# Patient Record
Sex: Female | Born: 1940
Health system: Southern US, Community
[De-identification: ages and names within clinical notes are randomized; demographics above are authoritative.]

## PROBLEM LIST (undated history)

## (undated) DIAGNOSIS — G43909 Migraine, unspecified, not intractable, without status migrainosus: Secondary | ICD-10-CM

## (undated) DIAGNOSIS — E7800 Pure hypercholesterolemia, unspecified: Secondary | ICD-10-CM

## (undated) DIAGNOSIS — Z8719 Personal history of other diseases of the digestive system: Secondary | ICD-10-CM

## (undated) DIAGNOSIS — K5790 Diverticulosis of intestine, part unspecified, without perforation or abscess without bleeding: Secondary | ICD-10-CM

## (undated) DIAGNOSIS — G2581 Restless legs syndrome: Secondary | ICD-10-CM

## (undated) DIAGNOSIS — E669 Obesity, unspecified: Secondary | ICD-10-CM

## (undated) DIAGNOSIS — J449 Chronic obstructive pulmonary disease, unspecified: Secondary | ICD-10-CM

## (undated) DIAGNOSIS — J45909 Unspecified asthma, uncomplicated: Secondary | ICD-10-CM

## (undated) DIAGNOSIS — K219 Gastro-esophageal reflux disease without esophagitis: Secondary | ICD-10-CM

## (undated) DIAGNOSIS — K635 Polyp of colon: Secondary | ICD-10-CM

## (undated) HISTORY — DX: Personal history of other diseases of the digestive system: Z87.19

## (undated) HISTORY — DX: Diverticulosis of intestine, part unspecified, without perforation or abscess without bleeding: K57.90

## (undated) HISTORY — PX: TONSILLECTOMY: SUR1361

## (undated) HISTORY — DX: Polyp of colon: K63.5

## (undated) HISTORY — DX: Obesity, unspecified: E66.9

## (undated) HISTORY — DX: Pure hypercholesterolemia, unspecified: E78.00

## (undated) HISTORY — DX: Restless legs syndrome: G25.81

## (undated) HISTORY — DX: Gastro-esophageal reflux disease without esophagitis: K21.9

## (undated) HISTORY — DX: Migraine, unspecified, not intractable, without status migrainosus: G43.909

## (undated) HISTORY — DX: Unspecified asthma, uncomplicated: J45.909

---

## 1989-02-17 HISTORY — PX: TOTAL ABDOMINAL HYSTERECTOMY: SHX209

## 1999-06-05 ENCOUNTER — Encounter: Payer: Self-pay | Admitting: Emergency Medicine

## 1999-06-05 ENCOUNTER — Inpatient Hospital Stay (HOSPITAL_COMMUNITY): Admission: EM | Admit: 1999-06-05 | Discharge: 1999-06-06 | Payer: Self-pay | Admitting: Emergency Medicine

## 1999-10-29 ENCOUNTER — Other Ambulatory Visit: Admission: RE | Admit: 1999-10-29 | Discharge: 1999-10-29 | Payer: Self-pay | Admitting: Internal Medicine

## 2000-01-21 ENCOUNTER — Encounter: Payer: Self-pay | Admitting: Internal Medicine

## 2000-01-21 ENCOUNTER — Encounter: Admission: RE | Admit: 2000-01-21 | Discharge: 2000-01-21 | Payer: Self-pay | Admitting: Internal Medicine

## 2000-09-19 ENCOUNTER — Other Ambulatory Visit: Admission: RE | Admit: 2000-09-19 | Discharge: 2000-09-19 | Payer: Self-pay | Admitting: Internal Medicine

## 2001-01-26 ENCOUNTER — Encounter: Payer: Self-pay | Admitting: Internal Medicine

## 2001-01-26 ENCOUNTER — Encounter: Admission: RE | Admit: 2001-01-26 | Discharge: 2001-01-26 | Payer: Self-pay | Admitting: Internal Medicine

## 2002-02-01 ENCOUNTER — Encounter: Admission: RE | Admit: 2002-02-01 | Discharge: 2002-02-01 | Payer: Self-pay | Admitting: Internal Medicine

## 2002-02-01 ENCOUNTER — Encounter: Payer: Self-pay | Admitting: Internal Medicine

## 2002-10-01 ENCOUNTER — Encounter: Payer: Self-pay | Admitting: Internal Medicine

## 2002-10-01 ENCOUNTER — Encounter: Admission: RE | Admit: 2002-10-01 | Discharge: 2002-10-01 | Payer: Self-pay | Admitting: Internal Medicine

## 2002-10-18 ENCOUNTER — Ambulatory Visit (HOSPITAL_COMMUNITY): Admission: RE | Admit: 2002-10-18 | Discharge: 2002-10-18 | Payer: Self-pay | Admitting: Internal Medicine

## 2002-10-18 ENCOUNTER — Encounter: Payer: Self-pay | Admitting: Internal Medicine

## 2003-02-04 ENCOUNTER — Encounter: Payer: Self-pay | Admitting: Internal Medicine

## 2003-02-04 ENCOUNTER — Encounter: Admission: RE | Admit: 2003-02-04 | Discharge: 2003-02-04 | Payer: Self-pay | Admitting: Internal Medicine

## 2003-08-23 ENCOUNTER — Ambulatory Visit (HOSPITAL_COMMUNITY): Admission: RE | Admit: 2003-08-23 | Discharge: 2003-08-23 | Payer: Self-pay | Admitting: Internal Medicine

## 2003-11-07 ENCOUNTER — Ambulatory Visit (HOSPITAL_COMMUNITY): Admission: RE | Admit: 2003-11-07 | Discharge: 2003-11-07 | Payer: Self-pay | Admitting: Gastroenterology

## 2003-11-07 ENCOUNTER — Encounter (INDEPENDENT_AMBULATORY_CARE_PROVIDER_SITE_OTHER): Payer: Self-pay | Admitting: Specialist

## 2004-02-06 ENCOUNTER — Encounter: Admission: RE | Admit: 2004-02-06 | Discharge: 2004-02-06 | Payer: Self-pay | Admitting: Internal Medicine

## 2005-01-21 ENCOUNTER — Other Ambulatory Visit: Admission: RE | Admit: 2005-01-21 | Discharge: 2005-01-21 | Payer: Self-pay | Admitting: Internal Medicine

## 2005-02-06 ENCOUNTER — Encounter: Admission: RE | Admit: 2005-02-06 | Discharge: 2005-02-06 | Payer: Self-pay | Admitting: Internal Medicine

## 2006-02-06 ENCOUNTER — Encounter: Admission: RE | Admit: 2006-02-06 | Discharge: 2006-02-06 | Payer: Self-pay | Admitting: Internal Medicine

## 2006-02-07 ENCOUNTER — Encounter: Admission: RE | Admit: 2006-02-07 | Discharge: 2006-02-07 | Payer: Self-pay | Admitting: Internal Medicine

## 2007-02-09 ENCOUNTER — Encounter: Admission: RE | Admit: 2007-02-09 | Discharge: 2007-02-09 | Payer: Self-pay | Admitting: Internal Medicine

## 2008-02-12 ENCOUNTER — Encounter: Admission: RE | Admit: 2008-02-12 | Discharge: 2008-02-12 | Payer: Self-pay | Admitting: Internal Medicine

## 2008-07-12 ENCOUNTER — Encounter (INDEPENDENT_AMBULATORY_CARE_PROVIDER_SITE_OTHER): Payer: Self-pay | Admitting: Gastroenterology

## 2008-07-12 ENCOUNTER — Observation Stay (HOSPITAL_COMMUNITY): Admission: AD | Admit: 2008-07-12 | Discharge: 2008-07-13 | Payer: Self-pay | Admitting: Internal Medicine

## 2008-09-02 ENCOUNTER — Other Ambulatory Visit: Admission: RE | Admit: 2008-09-02 | Discharge: 2008-09-02 | Payer: Self-pay | Admitting: Internal Medicine

## 2009-02-17 ENCOUNTER — Encounter: Admission: RE | Admit: 2009-02-17 | Discharge: 2009-02-17 | Payer: Self-pay | Admitting: Internal Medicine

## 2010-02-23 ENCOUNTER — Encounter: Admission: RE | Admit: 2010-02-23 | Discharge: 2010-02-23 | Payer: Self-pay | Admitting: Internal Medicine

## 2010-07-26 LAB — APTT: aPTT: 33 seconds (ref 24–37)

## 2010-07-26 LAB — CBC
HCT: 35.6 % — ABNORMAL LOW (ref 36.0–46.0)
HCT: 37.2 % (ref 36.0–46.0)
HCT: 41.7 % (ref 36.0–46.0)
Hemoglobin: 12.4 g/dL (ref 12.0–15.0)
Hemoglobin: 14.4 g/dL (ref 12.0–15.0)
MCHC: 33.5 g/dL (ref 30.0–36.0)
MCHC: 34.3 g/dL (ref 30.0–36.0)
MCHC: 34.7 g/dL (ref 30.0–36.0)
MCHC: 34.7 g/dL (ref 30.0–36.0)
MCV: 96.1 fL (ref 78.0–100.0)
MCV: 96.6 fL (ref 78.0–100.0)
Platelets: 142 10*3/uL — ABNORMAL LOW (ref 150–400)
Platelets: 178 10*3/uL (ref 150–400)
RBC: 3.71 MIL/uL — ABNORMAL LOW (ref 3.87–5.11)
RBC: 4.37 MIL/uL (ref 3.87–5.11)
RDW: 13.5 % (ref 11.5–15.5)
RDW: 13.5 % (ref 11.5–15.5)
WBC: 13.6 10*3/uL — ABNORMAL HIGH (ref 4.0–10.5)
WBC: 7.1 10*3/uL (ref 4.0–10.5)
WBC: 9.3 10*3/uL (ref 4.0–10.5)

## 2010-07-26 LAB — COMPREHENSIVE METABOLIC PANEL
Albumin: 3.9 g/dL (ref 3.5–5.2)
Alkaline Phosphatase: 90 U/L (ref 39–117)
BUN: 10 mg/dL (ref 6–23)
Calcium: 9.2 mg/dL (ref 8.4–10.5)
Glucose, Bld: 117 mg/dL — ABNORMAL HIGH (ref 70–99)
Potassium: 4.2 mEq/L (ref 3.5–5.1)
Total Protein: 7 g/dL (ref 6.0–8.3)

## 2010-07-26 LAB — PROTIME-INR
INR: 1 (ref 0.00–1.49)
Prothrombin Time: 13.4 seconds (ref 11.6–15.2)

## 2010-08-28 NOTE — H&P (Signed)
NAMESHEALYN, [name] [name]                   ACCOUNT NO.:  [id number]   MEDICAL RECORD NO.:  [id number]          PATIENT TYPE:  INP   LOCATION:  5529                         FACILITY:  [facility]   PHYSICIAN:  Georgann Housekeeper, MD      DATE OF BIRTH:  10-11-1940   DATE OF ADMISSION:  07/12/2008  DATE OF DISCHARGE:                              HISTORY & PHYSICAL   CHIEF COMPLAINT:  Bright blood per rectum.   HISTORY OF PRESENT ILLNESS:  Since 2:15 a.m. yesterday morning, has been  passing bright red blood per rectum.  Initially had a loose stool.   It is not associated with any fever, chills, or nausea.   She does have lower abdominal pain with each passage of blood.  Initially there was blood and mucus every 30 minutes, since 6 a.m. this  morning, 2 more stools.  She has a history of lower GI bleed in 2001  with Dr. Laural Benes.   She has not been drinking any alcohol and no antiinflammatory use.   ALLERGIES:  PENICILLIN, SULFA, MACROLIDE, SHELLFISH.   MEDICATIONS:  1. Celexa 10 mg one half tablet a day.  2. Also takes Centrum Silver one a day.  3. Vitamin C one a day.  4. Calcium and vitamin D one a day.  5. Fish oil one a day.  6. Over-the-counter antihistamine once a day.   PAST MEDICAL HISTORY:  1. Colon polyps in July 2005.  2. Diverticulosis.  3. Asthma (asthmatic bronchitis).  4. Panniculitis to lower extremities.  5. GERD.  6. Situational depression and anxiety.  7. Obesity.  8. History of vasovagal syncope.  9. History of migraine headaches.  10.History of hypercholesterolemia.  11.Bleeding hemorrhagic colitis in March 1996, resolved with      antibiotic.  12.Back pain.  13.Right shoulder DJD.  14.Heavy GI bleed in the past, felt to be possibly due to a Dieulafoy      lesion.   PAST SURGICAL HISTORY:  1. TAH-BSO secondary to abnormal Pap smear.  2. Tonsillectomy.  3. Adenoidectomy.   FAMILY HISTORY:  Positive for hypertension, migraine headaches, prostate  cancer,  coronary artery disease, and gallstones.   HABITS:  No smoking, rare EtOH.  She stopped smoking in 2003.   SOCIAL HISTORY:  She is retired from [employer].   She worked at [employer] as a Scientist, physiological.  She is widowed.   REVIEW OF SYSTEMS:  Otherwise negative other than chief complaint.   PHYSICAL EXAMINATION:  GENERAL APPEARANCE:  Obese female in no acute  distress.  VITAL SIGNS:  Blood pressure 110/80, pulse 72, temperature 98.5, and  weight 208.  EYES:  PERRL.  EOMs intact.  Funduscopic normal.  EARS:  Well aligned, normal.  NOSE AND THROAT:  Unremarkable.  NECK:  Supple with no thyroid enlargement or tenderness.  No  lymphadenopathy or carotid bruit.  CHEST:  Clear to auscultation.  BREASTS:  Deferred.  HEART:  Normal S1 and S2.  No murmurs, gallops, rubs, or click.  ABDOMEN:  Obese with diffuse tenderness on bilateral lower quadrants.  No organomegaly, masses,  bruits or hernia.  RECTUM:  Obvious blood.  PELVIC:  Deferred.  EXTREMITIES:  No edema.  Good reflexes, sensation, and motor function.  NEUROLOGIC:  Oriented x3.  Cranial nerves II through XII intact.  SKIN:  Clear.   ASSESSMENT:  Lower gastrointestinal bleed with history of hemorrhagic  colitis and Dieulafoy lesion.   PLAN:  Admitted for GI consult.      Lavinia Sharps, N.P.      Georgann Housekeeper, MD  Electronically Signed    MAP/MEDQ  D:  07/12/2008  T:  07/13/2008  Job:  [id number]

## 2010-08-28 NOTE — Op Note (Signed)
NAMEAMBERLIN, [name]                   ACCOUNT NO.:  [id number]   MEDICAL RECORD NO.:  [id number]          PATIENT TYPE:  INP   LOCATION:  5529                         FACILITY:  [facility]   PHYSICIAN:  Danise Edge, M.D.   DATE OF BIRTH:  02/26/1941   DATE OF PROCEDURE:  07/13/2008  DATE OF DISCHARGE:  07/13/2008                               OPERATIVE REPORT   HISTORY:  [name] is a 70 year old female, born on December 09, 1940.  The patient was admitted to [facility] to evaluate and  treat hematochezia associated with lower abdominal cramps.  Approximately 5 years ago, she underwent a colonoscopy with removal of 2  small adenomatous polyps from her cecum.   MEDICATION ALLERGIES:  PENICILLIN, SULFA, MACROLIDES, and SHELLFISH.   CHRONIC MEDICATIONS:  Celexa, Centrum Silver vitamin, vitamin C,  calcium, vitamin D, fish oil, and over-the-counter antihistamines.   PAST MEDICAL HISTORY:  1. Adenomatous colon polyps removed colonoscopically in July 2005.  2. Colonic diverticulosis.  3. Asthma.  4. Panniculitis.  5. Gastroesophageal reflux disease.  6. Situational depression.  7. Anxiety.  8. Vasovagal syncope.  9. Migraine headaches.  10.Hypercholesterolemia.  11.Degenerative joint disease of the right shoulder.  12.Total abdominal hysterectomy with bilateral salpingo-oophorectomy.  13.Tonsillectomy.   HABITS:  The patient stopped smoking cigarettes in 2003.  She rarely  consumes alcohol.   ENDOSCOPIST:  Danise Edge, MD   PREMEDICATION:  Fentanyl 100 mcg and Versed 10 mg.   PROCEDURE:  After obtaining informed consent, the patient was placed in  the left lateral decubitus position.  I administered intravenous  fentanyl and intravenous Versed to achieve conscious sedation for the  procedure.  The patient's blood pressure, oxygen saturation, and cardiac  rhythm were monitored throughout the procedure and documented in the  medical record.   Anal inspection and  digital rectal exam were normal.  The Pentax  pediatric colonoscope was introduced into the rectum and with some  degree of difficulty due to sigmoid colon looping eventually advanced to  the cecum.  Endoscope advancement was best achieved with the patient in  the right lateral decubitus position.  Colonic preparation for the exam  today was good.   Rectum normal.  Retroflexed view of the distal rectum normal.   Sigmoid colon and descending colon.  Focal sigmoid colitis (probable  mucosal ischemic colitis) involving the sigmoid colon from approximately  25 cm to 30 cm from the anal verge.  Biopsies were performed.  The  mucosa is red and friable, but there are no ulcers.  It has the  endoscopic appearance of ischemic colitis.  There is no active bleeding.   Splenic flexure normal.   Transverse colon.  From the proximal transverse colon, a 1-cm sessile  polyp was removed with the hot snare.   Hepatic flexure:  A 5-mm polyp was removed with the hot snare.   Ascending colon.  From the mid - distal descending colon, 2 diminutive  polyps were removed with the cold snare.   Cecum and ileocecal valve normal.   1. Focal ischemic colitis  involving a short segment of the distal      sigmoid colon with biopsies performed.  2. A 1-cm polyp removed from the proximal transverse colon.  3. A 5-mm polyp removed from the hepatic flexure.  4. Two diminutive polyps removed from the mid - distal descending      colon with the cold snare.   All polyps were submitted in 1 bottle for pathologic evaluation.   RECOMMENDATIONS:  Repeat colonoscopy in 5 years.           ______________________________  Danise Edge, M.D.     MJ/MEDQ  D:  07/13/2008  T:  07/13/2008  Job:  [id number]

## 2010-08-31 NOTE — Op Note (Signed)
[name], [name]                             ACCOUNT NO.:  [id number]   MEDICAL RECORD NO.:  [id number]                   PATIENT TYPE:  AMB   LOCATION:  ENDO                                 FACILITY:  [facility]   PHYSICIAN:  Danise Edge, M.D.                DATE OF BIRTH:  12-29-1940   DATE OF PROCEDURE:  11/07/2003  DATE OF DISCHARGE:                                 OPERATIVE REPORT   PROCEDURE:  Colonoscopy with polypectomy.   ENDOSCOPIST:  Danise Edge, M.D.   PREMEDICATION:  Versed 7.5 mg, Demerol 70 mg.   ENDOSCOPE:  Olympus pediatric coloscope.   INDICATIONS:  [name] is a 70 year old female scheduled to undergo her  first screening colonoscopy with polypectomy to prevent colon cancer.   DESCRIPTION OF PROCEDURE:  After obtaining informed consent, [name] was  placed in the left lateral decubitus position.  I administered intravenous  Demerol and intravenous Versed to achieve conscious sedation for the  procedure. The patient's blood pressure, oxygen saturation and cardiac  rhythm were monitored throughout the procedure and documented in the medical  record.   Anal inspection and digital rectal exam were normal.  The Olympus adjustable  pediatric colonoscope was introduced into the rectum  and advanced to the cecum. Colonic preparation for the exam today was  excellent.   [name] does have a few small diverticula in the left and right colon  without signs of diverticulitis or diverticular stricture formation.   Rectum:  Normal. Sigmoid colon and descending colon:  Normal. Splenic  flexure:  Normal. Transverse colon:  Normal.  Hepatic flexure:  Normal.  Ascending colon:  Normal. Cecum and ileocecal valve:  From the proximal  cecum, adjacent to the appendiceal orifice, 2 diminutive (0.5 mg)  polyps  were removed with the cold biopsy forceps.   ASSESSMENT:  Two diminutive polyps were removed from the cecum.  Scattered  small diverticula in the right and  left colon.                                               Danise Edge, M.D.    MJ/MEDQ  D:  11/07/2003  T:  11/07/2003  Job:  [id number]   cc:   Candyce Churn, M.D.  [address] [place]  [place] [address]  Fax: [phone]

## 2010-08-31 NOTE — H&P (Signed)
[name]  Patient:    [name], [name]                          MRN: [id number] Adm. Date:  [id number] Disc. Date: [id number] Attending:  Pearla Dubonnet                         History and Physical  CHIEF COMPLAINT:  Sudden onset of severe wheezing this morning on coming to work in the cold weather.  HISTORY OF PRESENT ILLNESS:  [name] is a very pleasant 70 year old female who is a chronic smoker, with a history of asthma, which is primarily induced by her smoking.  She presents with increasing respiratory distress after a two-week illness with a congestive cough.  She has been taking Biaxin for about two weeks. 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  She was transported down to the emergency room where she was noted to have increased expiratory wheezing with accessory muscle breathing.  The chest x-ray was consistent with bronchitis.  She was treated for several hours with albuterol respiratory treatments with continued expiratory wheezes after three treatments.  She had very prolonged expiratory phase, and she was admitted for asthmatic exacerbation. She denied fevers or chills, and her phlegm has been relatively clear.  CURRENT MEDICATIONS: 1. Intal elixir. 2. Premarin. 3. Biaxin.  PAST MEDICAL HISTORY: 1. Question of a connective tissue disease with positive ANA.  She had    insufficient findings on physical examination to detect any particular    diagnosis.  Followup with Dr. Demetria Pore. Levitin. 2. Asthma, usually tobacco-induced. 3. Gastroesophageal reflux disease. 4. Situational depression/anxiety in the past. 5. Obesity. 6. History of migraine headaches. 7. Tobacco abuse which continues.  PAST SURGICAL HISTORY: 1. TAH, BSO. 2. Tonsillectomy and adenoidectomy.  FAMILY HISTORY:  Positive for hypertension and migraine  headaches.  SOCIAL HISTORY:  The patient is married and has smoked tobacco for many years. She does not drink alcohol.  She works as a Nutritional therapist at [employer].  REVIEW OF SYSTEMS:  Denies chest pain or fever.  Very short of breath on presentation to the emergency room.  Very anxious about feeling so short of breath. Her intensity of wheezing suddenly increased while walking across the parking lot into [facility] on the morning of admission.  The temperatures were very cold outside.  ALLERGIES:  PENICILLIN AND SULFA.  PHYSICAL EXAMINATION:  GENERAL:  A well-developed, well-nourished female, with increased respiratory distress noted at approximately 8 a.m., but better by 1 p.m. at the time of admission.  Pulse oximetry on room air was 97%, blood pressure 134/78, pulse 87 and regular, respirations 28 and slightly labored, but much less so than at 8 a.m.  Room air O2 saturation had gone from 91% to 95% to 97% while in the emergency room.  HEENT:  Benign.  She wears glasses.  NECK:  Without jugular venous distention.  CHEST:  Diffuse expiratory wheezes noted in all lobar distributions.  She had a  prolonged expiratory phase.  CARDIAC:  A regular rate and rhythm without murmur.  ABDOMEN:  Soft, nontender.  Normal bowel sounds, no organomegaly.  No bruits, no masses.  EXTREMITIES:  Without cyanosis, clubbing, or edema.  NEUROLOGIC:  Nonfocal.  SKIN:  Without rashes.  BREASTS/RECTAL:  Examinations not performed.  LABORATORY DATA:  CBC:  White blood count 6700, hemoglobin 15.6, platelet count  148,000.  Pro time 12.3 seconds, INR 0.9.  Electrolytes revealed a sodium of 138, potassium 3.7, chloride 102, bicarbonate 27, glucose 104, BUN 10, creatinine 0.8. Liver function tests within normal limits.  Albumin 3.9.  Thyroid stimulating hormone was 4.475, within normal limits.  Chest x-ray revealed changes of bronchitis,  associated with hyperinflation. There were mild degenerative changes noted in the spine.  Electrocardiogram revealed sinus rhythm, within normal limits.  ASSESSMENT:  A 70 year old female with bronchial inflammation for two weeks, and the sudden severe bronchospasm on the morning of admission.  This was induced by being exposed to cold weather.  She is now clearing only very slowly in the emergency room.  PLAN:  1. Admit to [facility] and treat with IV Solu-Medrol, as well     as q.4h. albuterol and Atrovent respiratory treatments.  2. Levaquin therapy for probable bronchitis.  3. Gentle IV hydration for 12 hours.  4. Xanax for anxiety since she will not be smoking while in the hospital.  5. Tussionex for severe cough.  6. Prevacid 30 mg q.d. for a history of GERD.  7. Peak flow q.4h.  8. Hope to discharge within 24 hours.  9. Pulse oximetry q.4h. 10. Nasal cannula O2 p.r.n. to keep O2 saturations greater than 90%. DD:  06/09/99 TD:  06/09/99 Job: [id number]

## 2010-08-31 NOTE — Discharge Summary (Signed)
[facility]  Patient:    [name], [name]                          MRN: [id number] Adm. Date:  [id number] Disc. Date: [id number] Attending:  Pearla Dubonnet                           Discharge Summary  DATE OF BIRTH:  [birth year]  DISCHARGE DIAGNOSES: 1. Asthmatic exacerbation. 2. Tobacco abuse, chronic. 3. Question of connective tissue disease with positive antinuclear antibody    in the past. 4. Gastroesophageal reflux disease. 5. History of situational depression and anxiety in the distant past. 6. Obesity. 7. History of migraine headaches.  DISCHARGE MEDICATIONS: 1. Prevacid 30 mg q.d. 2. Prednisone taper 50 mg, tapering down to 10 mg over 10 days. 3. Premarin 0.625 mg q.d. 4. Serevent inhaler two puffs inhaled b.i.d. 5. Albuterol inhaler two puffs inhaled q.4h. p.r.n. wheezing.  HOSPITAL COURSE:  Ms. 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  She had been coughing or two weeks and taking Biaxin.  She continued to smoke until the day of admission. She called from the operators office at approximately 8 a.m. saying that she was extremely short of breath and she was transferred over to the emergency room where she was noted to have prolonged expiratory wheezing.  A chest x-ray revealed hyperinflation, with changes consistent with bronchitis.  Otherwise it was clear.  LABORATORY DATA:  Other laboratory were essentially within normal limits.  Electrocardiogram was within normal limits as well.  She had three respiratory treatments in the emergency room from 8 a.m. to 1 p.m., and still had prolonged expiratory wheezing, and felt chest tightness.  She was  admitted and continued treatments with IV Solu-Medrol and q.4h. respiratory treatments.  On the morning of discharge her peak  flow was 250 L per minute. She felt much improved.  She only had a minimal end expiratory wheeze on examination.  DISPOSITION:  She was discharged home on the above medications, feeling much improved.  FOLLOWUP:  To follow up with me in my office in approximately one week.  Please see the history and physical for other details of her admission. DD:  06/09/99 TD:  06/10/99 Job: [id number]

## 2010-08-31 NOTE — Discharge Summary (Signed)
NAMEBRITAINY, [name]                   ACCOUNT NO.:  [id number]   MEDICAL RECORD NO.:  [id number]          PATIENT TYPE:  OBV   LOCATION:  5529                         FACILITY:  [facility]   PHYSICIAN:  Danise Edge, M.D.   DATE OF BIRTH:  [birth year]   DATE OF ADMISSION:  07/12/2008  DATE OF DISCHARGE:  07/13/2008                               DISCHARGE SUMMARY   DISCHARGE DIAGNOSES:  1. Resolved lower gastrointestinal bleeding.  2. Focal ischemic colitis involving the distal sigmoid colon.  3. A 1-cm polyp removed from the proximal transverse colon.  4. A 5-mm polyp removed from the hepatic flexure.  5. Two 2-mm polyps removed from the descending colon.   HOSPITAL COURSE:  Ms. [name] is a 70 year old female, born on 1940/08/06.  According to the hospital record, Ms. [name] was [facility] to  [facility] on July 12, 2008, by Dr. Georgann Housekeeper.  On July 12, 2008, I was consulted to perform a diagnostic colonoscopy to  evaluate painless hematochezia.  In July 2005, the patient underwent a  colonoscopy which showed universal colonic diverticulosis and resulted  in the removal of adenomatous polyps.   On July 13, 2008, she underwent a diagnostic colonoscopy which revealed  resolved lower gastrointestinal bleeding, focal ischemic colitis  involving the distal sigmoid colon.  Four small polyps were removed from  the colon.  She was instructed to remain off aspirin for 2 weeks.  She  was discharged from the hospital in stable medical condition.   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  No invasive cancer was identified.   The patient should undergo a repeat colonoscopy in 3 years.           ______________________________  Danise Edge, M.D.     MJ/MEDQ  D:  09/14/2008  T:  09/15/2008  Job:  [id number]

## 2011-01-07 ENCOUNTER — Other Ambulatory Visit: Payer: Self-pay | Admitting: Internal Medicine

## 2011-01-07 DIAGNOSIS — Z1231 Encounter for screening mammogram for malignant neoplasm of breast: Secondary | ICD-10-CM

## 2011-03-01 ENCOUNTER — Ambulatory Visit
Admission: RE | Admit: 2011-03-01 | Discharge: 2011-03-01 | Disposition: A | Discharge status: Home / Self Care | Payer: Medicare Other | Source: Ambulatory Visit | Attending: Internal Medicine | Admitting: Internal Medicine

## 2011-03-01 DIAGNOSIS — Z1231 Encounter for screening mammogram for malignant neoplasm of breast: Secondary | ICD-10-CM

## 2011-03-28 ENCOUNTER — Encounter: Payer: Self-pay | Admitting: Pulmonary Disease

## 2011-03-29 ENCOUNTER — Telehealth: Payer: Self-pay | Admitting: Pulmonary Disease

## 2011-03-29 ENCOUNTER — Ambulatory Visit (INDEPENDENT_AMBULATORY_CARE_PROVIDER_SITE_OTHER): Payer: Medicare Other | Admitting: Pulmonary Disease

## 2011-03-29 ENCOUNTER — Ambulatory Visit (INDEPENDENT_AMBULATORY_CARE_PROVIDER_SITE_OTHER)
Admission: RE | Admit: 2011-03-29 | Discharge: 2011-03-29 | Disposition: A | Discharge status: Home / Self Care | Payer: Medicare Other | Source: Ambulatory Visit | Attending: Pulmonary Disease | Admitting: Pulmonary Disease

## 2011-03-29 ENCOUNTER — Encounter: Payer: Self-pay | Admitting: Pulmonary Disease

## 2011-03-29 VITALS — BP 130/72 | HR 98 | Temp 98.4°F | Ht 67.0 in | Wt 239.4 lb

## 2011-03-29 DIAGNOSIS — R0609 Other forms of dyspnea: Secondary | ICD-10-CM

## 2011-03-29 DIAGNOSIS — R0600 Dyspnea, unspecified: Secondary | ICD-10-CM

## 2011-03-29 DIAGNOSIS — J449 Chronic obstructive pulmonary disease, unspecified: Secondary | ICD-10-CM

## 2011-03-29 DIAGNOSIS — R0989 Other specified symptoms and signs involving the circulatory and respiratory systems: Secondary | ICD-10-CM

## 2011-03-29 MED ORDER — OMEPRAZOLE 40 MG PO CPDR: 40.0000 mg | DELAYED_RELEASE_CAPSULE | Freq: Two times a day (BID) | ORAL | Status: DC

## 2011-03-29 NOTE — Telephone Encounter (Signed)
Called [facility], pharmacist stated that prilosec bid requires PA - PA info was faxed to our office per pharmacist and he stated that he rec'd pt call her mailorder pharmacy to see if she already had a shipment pending through them and that's why insurance won't pay for it.    Called spoke with patient, she states that she has not used her mail order x6 months.  Advised that she try omeprazole 20mg  OTC 2 bid until the PA can be taken care of.  Pt okay with these recs and verbalized her understanding.

## 2011-03-29 NOTE — Patient Instructions (Signed)
Stay on symbicort 2 puffs am and pm everyday as you are doing.  Keep mouth rinsed well Will add spiriva one inhalation each am everyday Will treat acid reflux more aggressively with omeprazole 40mg  in am and pm before eating.  Would like you to also get zantac 300mg  otc and take each night at bedtime until next visit. Will check cxr today and call you with results. Will check full breathing studies in 4 weeks, and see you back same day to review.

## 2011-03-29 NOTE — Progress Notes (Signed)
  Subjective:    Patient ID: [name], female    DOB: 04-21-1940, 70 y.o.   MRN: [id number]  HPI The patient is a 70 year old female who I've been asked to see for ongoing pulmonary issues.  She has been given the diagnosis of COPD years ago, but also tells me that she was diagnosed with asthma in her late 35s and put on a nebulizer.  The patient thinks that her breathing is worsening, and she has had 3-4 episodes this year that have been labeled as "acute exacerbation" her last flareup was the end of November, and she was treated with antibiotics and prednisone.  The patient notes a one block dyspnea on exertion at a moderate pace on flat ground.  She will also get winded bringing groceries in from the car, and doing any kind of light housework.  She notes very significant reflux symptoms, despite being on a proton pump inhibitor.  She notes a barking cough at night and also first thing in the morning upon arising.  The patient currently is on symbicort, and has taken this compliantly.  She has had no recent chest x-ray or full pulmonary function studies.  Of note, her weight is neutral over the last one year.    Review of Systems  Constitutional: Negative for fever and unexpected weight change.  HENT: Positive for congestion. Negative for ear pain, nosebleeds, sore throat, rhinorrhea, sneezing, trouble swallowing, dental problem, postnasal drip and sinus pressure.   Eyes: Negative for redness and itching.  Respiratory: Positive for cough and shortness of breath. Negative for chest tightness and wheezing.   Cardiovascular: Positive for palpitations. Negative for leg swelling.  Gastrointestinal: Negative for nausea and vomiting.  Genitourinary: Negative for dysuria.  Musculoskeletal: Positive for joint swelling.  Skin: Negative for rash.  Neurological: Negative for headaches.  Hematological: Does not bruise/bleed easily.  Psychiatric/Behavioral: Positive for dysphoric mood. The patient is not  nervous/anxious.        Objective:   Physical Exam Constitutional:  Obese female, no acute distress  HENT:  Nares patent without discharge  Oropharynx without exudate, palate and uvula are normal  Eyes:  Perrla, eomi, no scleral icterus  Neck:  No JVD, no TMG  Cardiovascular:  Normal rate, regular rhythm, no rubs or gallops.  No murmurs        Intact distal pulses  Pulmonary :  Normal breath sounds, no stridor or respiratory distress   No rales, rhonchi, or wheezing. Prominent upper airway pseudowheezing  Abdominal:  Soft, nondistended, bowel sounds present.  No tenderness noted.   Musculoskeletal:  No lower extremity edema noted.  Lymph Nodes:  No cervical lymphadenopathy noted  Skin:  No cyanosis noted  Neurologic:  Alert, appropriate, moves all 4 extremities without obvious deficit.         Assessment & Plan:

## 2011-03-29 NOTE — Assessment & Plan Note (Signed)
I suspect, with the patient's long history of tobacco abuse, that she probably does have some degree of chronic airflow obstruction.  It is unclear how much asthma may be playing a role here.  She has had multiple episodes this year that required antibiotics and prednisone, and I wonder how much of this is related to her underlying lung issues or possibly laryngopharyngeal reflux.  She is having persistent reflux symptoms despite taking a proton pump inhibitor.  She also describes cyclical coughing as well, which is typically an upper airway issue.  I would like to check a chest x-ray today, and schedule her for full pulmonary function studies.  I would also like to treat her reflux more aggressively over the next 3-4 weeks.  We'll also add Spiriva to her symbicort to see if this helps.

## 2011-04-03 ENCOUNTER — Telehealth: Payer: Self-pay | Admitting: *Deleted

## 2011-04-03 NOTE — Telephone Encounter (Signed)
Received fax from [facility] Dr. Herold Harms a prior Berkley Harvey for Omeprazole 40mg  bid.  .  Called ins company at [phone]  Pt's ID # is [id number]    Case id #  [id number]  Awaiting paperwork to be faxed to the triage fax #.    Received fax and put in KC's very important look at folder for him to review and sign

## 2011-04-03 NOTE — Telephone Encounter (Signed)
done

## 2011-04-03 NOTE — Telephone Encounter (Signed)
KC filled out paperwork and this has been faxed back to ins company.  The paperwork is in the triage room in the PA file stack waiting for approval or denial

## 2011-04-04 NOTE — Telephone Encounter (Signed)
Received fax back from [facility].  PA has been approved until 10/01/2011.  Pharmacy aware.

## 2011-05-03 ENCOUNTER — Ambulatory Visit: Payer: Medicare Other | Admitting: Pulmonary Disease

## 2011-05-17 ENCOUNTER — Ambulatory Visit (INDEPENDENT_AMBULATORY_CARE_PROVIDER_SITE_OTHER): Payer: Medicare Other | Admitting: Pulmonary Disease

## 2011-05-17 ENCOUNTER — Encounter: Payer: Self-pay | Admitting: Pulmonary Disease

## 2011-05-17 VITALS — BP 122/74 | HR 93 | Temp 98.3°F | Ht 67.0 in | Wt 230.0 lb

## 2011-05-17 DIAGNOSIS — R0609 Other forms of dyspnea: Secondary | ICD-10-CM

## 2011-05-17 DIAGNOSIS — J449 Chronic obstructive pulmonary disease, unspecified: Secondary | ICD-10-CM

## 2011-05-17 DIAGNOSIS — R05 Cough: Secondary | ICD-10-CM

## 2011-05-17 DIAGNOSIS — R0600 Dyspnea, unspecified: Secondary | ICD-10-CM

## 2011-05-17 DIAGNOSIS — R059 Cough, unspecified: Secondary | ICD-10-CM

## 2011-05-17 LAB — PULMONARY FUNCTION TEST

## 2011-05-17 MED ORDER — OMEPRAZOLE 20 MG PO CPDR: 40.0000 mg | DELAYED_RELEASE_CAPSULE | Freq: Two times a day (BID) | ORAL | Status: DC

## 2011-05-17 MED ORDER — TIOTROPIUM BROMIDE MONOHYDRATE 18 MCG IN CAPS: 18.0000 ug | ORAL_CAPSULE | Freq: Every day | RESPIRATORY_TRACT | Status: DC

## 2011-05-17 NOTE — Progress Notes (Signed)
PFT done today. 

## 2011-05-17 NOTE — Assessment & Plan Note (Signed)
The patient has moderate airflow obstruction by pulmonary function studies today, and based on history, most consistent with a combination of emphysema and asthma.  She has seen considerable improvement with the addition of Spiriva, and therefore I will leave her on this along with her symbicort.  I stressed to her the importance of weight loss and conditioning, and recommended referral to pulmonary rehabilitation.

## 2011-05-17 NOTE — Patient Instructions (Signed)
Would stay on omeprazole 40mg  in am and pm for 4 more weeks.  If you do not feel this has made a difference to your cough, go back to your prior dose of 20mg  once a day. Ok to stop zantac at bedtime. Stay on spiriva and symbicort Will refer to pulmonary rehab.  Consider attending if able after talking to director Work on weight loss and conditioning. Try neilmed sinus rinses until sinuses are doing better, and turn heat up on humidifier on cpap.  followup with me in 6mos if doing well, but call if you feel cough is not improving.

## 2011-05-17 NOTE — Assessment & Plan Note (Signed)
The patient's cough seems more upper airway in origin, and I had tried her on a more intensified regimen for reflux disease.  She saw some improvement, but not a lot.  She is having a lot of nasal congestion and pressure, but has recently been started on CPAP and has her heated humidity at a low level.  I have asked her to turn the heat up on the humidifier, and have recommended sinus rinses a.m. And p.m. Until she improves.  If she continues to have issues, it is possible that she may have a sinus infection.  I would like to treat for reflux more aggressively for another 4 weeks, but if she does not see a big change would go back to her prior dose of proton pump inhibitor.

## 2011-05-17 NOTE — Progress Notes (Signed)
  Subjective:    Patient ID: [name], female    DOB: 01-14-1941, 71 y.o.   MRN: [id number]  HPI Patient comes in today for followup after her pulmonary function studies.  She also had a cough at the last visit that I felt was more upper airway in origin.  She was started on a more aggressive regimen for reflux, but has not seen a big difference.  Now, she is having a lot of sinus congestion and pressure, and tells me that she recently was started on CPAP.  Her heater on the humidifier is set fairly low based on her just her a period she was also started on Spiriva at the last visit, and has seen improvement in her breathing since that time.   Review of Systems  Constitutional: Positive for chills. Negative for fever and unexpected weight change.  HENT: Positive for congestion and sneezing. Negative for ear pain, nosebleeds, sore throat, rhinorrhea, trouble swallowing, dental problem, postnasal drip and sinus pressure.   Eyes: Positive for itching. Negative for redness.  Respiratory: Positive for cough, shortness of breath and wheezing. Negative for chest tightness.   Cardiovascular: Negative for palpitations and leg swelling.  Gastrointestinal: Negative for nausea and vomiting.  Genitourinary: Negative for dysuria.  Musculoskeletal: Negative for joint swelling.  Skin: Negative for rash.  Neurological: Negative for headaches.  Hematological: Does not bruise/bleed easily.  Psychiatric/Behavioral: Negative for dysphoric mood. The patient is not nervous/anxious.        Objective:   Physical Exam Obese female in no acute distress Nose with edematous mucosa but no purulence. Chest totally clear to auscultation, no wheezing Cardiac exam with regular rate and rhythm Lower extremities with minimal ankle edema, no cyanosis Alert and oriented, moves all 4 extremities.       Assessment & Plan:

## 2011-05-24 ENCOUNTER — Ambulatory Visit: Payer: Medicare Other | Admitting: Pulmonary Disease

## 2011-05-31 ENCOUNTER — Other Ambulatory Visit: Payer: Self-pay | Admitting: Geriatric Medicine

## 2011-05-31 DIAGNOSIS — M8580 Other specified disorders of bone density and structure, unspecified site: Secondary | ICD-10-CM

## 2011-06-14 ENCOUNTER — Ambulatory Visit
Admission: RE | Admit: 2011-06-14 | Discharge: 2011-06-14 | Disposition: A | Discharge status: Home / Self Care | Payer: Medicare Other | Source: Ambulatory Visit | Attending: Geriatric Medicine | Admitting: Geriatric Medicine

## 2011-06-14 DIAGNOSIS — M8580 Other specified disorders of bone density and structure, unspecified site: Secondary | ICD-10-CM

## 2011-07-08 ENCOUNTER — Other Ambulatory Visit: Payer: Self-pay | Admitting: Gastroenterology

## 2011-09-06 ENCOUNTER — Other Ambulatory Visit: Payer: Self-pay | Admitting: Pulmonary Disease

## 2011-11-15 ENCOUNTER — Encounter: Payer: Self-pay | Admitting: Pulmonary Disease

## 2011-11-15 ENCOUNTER — Ambulatory Visit (INDEPENDENT_AMBULATORY_CARE_PROVIDER_SITE_OTHER): Payer: Medicare Other | Admitting: Pulmonary Disease

## 2011-11-15 VITALS — BP 102/62 | HR 79 | Temp 98.3°F | Ht 67.0 in | Wt 250.0 lb

## 2011-11-15 DIAGNOSIS — R05 Cough: Secondary | ICD-10-CM

## 2011-11-15 DIAGNOSIS — J449 Chronic obstructive pulmonary disease, unspecified: Secondary | ICD-10-CM

## 2011-11-15 DIAGNOSIS — R059 Cough, unspecified: Secondary | ICD-10-CM

## 2011-11-15 MED ORDER — OMEPRAZOLE 20 MG PO CPDR: DELAYED_RELEASE_CAPSULE | ORAL | Status: DC

## 2011-11-15 NOTE — Progress Notes (Signed)
  Subjective:    Patient ID: [name], female    DOB: 13-Jul-1940, 71 y.o.   MRN: [id number]  HPI Patient comes in today for followup of her known COPD.  She has seen a significant improvement in her breathing with the addition of Spiriva.  She has not had any acute exacerbation since last visit, nor has she required her rescue inhaler.  Her cough is also much improved with b.i.d. Proton pump inhibitor, and also making adjustments on her CPAP humidifier.   Review of Systems  Constitutional: Negative for fever and unexpected weight change.  HENT: Negative for ear pain, nosebleeds, congestion, sore throat, rhinorrhea, sneezing, trouble swallowing, dental problem, postnasal drip and sinus pressure.   Eyes: Negative for redness and itching.  Respiratory: Positive for shortness of breath and wheezing. Negative for cough and chest tightness.   Cardiovascular: Negative for palpitations and leg swelling.  Gastrointestinal: Negative for nausea and vomiting.  Genitourinary: Negative for dysuria.  Musculoskeletal: Positive for joint swelling.  Skin: Negative for rash.  Neurological: Negative for headaches.  Hematological: Does not bruise/bleed easily.  Psychiatric/Behavioral: Negative for dysphoric mood. The patient is nervous/anxious.        Objective:   Physical Exam Obese female in no acute distress Nose without purulent discharge noted Chest with mild decrease in breath sounds, but no wheezing or rhonchi. Cardiac exam with regular rate and rhythm Lower extremities without edema, no cyanosis Alert and oriented, moves all 4 extremities.       Assessment & Plan:

## 2011-11-15 NOTE — Assessment & Plan Note (Signed)
Much improved with increased dose of PPI, and also with adjustment of heater on her cpap machine.

## 2011-11-15 NOTE — Patient Instructions (Addendum)
Stay on current breathing medications Stay on your acid reflux medication at the current dose Work on weight loss and conditioning. followup with me in 6mos.

## 2011-11-15 NOTE — Assessment & Plan Note (Signed)
The patient is doing well on symbicort and Spiriva, and has not had any flareups since the last visit.  I have stressed to her the importance of weight loss and some type of exercise program in order to improve her exertional tolerance.  She also feels that her cough is much improved since being on b.i.d. Proton pump inhibitor, and we'll continue this.  If she is doing well, she is to follow up in 6 months.

## 2012-01-07 ENCOUNTER — Other Ambulatory Visit: Payer: Self-pay | Admitting: Geriatric Medicine

## 2012-01-07 DIAGNOSIS — Z1231 Encounter for screening mammogram for malignant neoplasm of breast: Secondary | ICD-10-CM

## 2012-03-02 ENCOUNTER — Ambulatory Visit
Admission: RE | Admit: 2012-03-02 | Discharge: 2012-03-02 | Disposition: A | Discharge status: Home / Self Care | Payer: Medicare Other | Source: Ambulatory Visit | Attending: Geriatric Medicine | Admitting: Geriatric Medicine

## 2012-03-02 DIAGNOSIS — Z1231 Encounter for screening mammogram for malignant neoplasm of breast: Secondary | ICD-10-CM

## 2012-05-22 ENCOUNTER — Ambulatory Visit: Payer: Medicare Other | Admitting: Pulmonary Disease

## 2013-01-21 ENCOUNTER — Other Ambulatory Visit: Payer: Self-pay

## 2013-01-21 DIAGNOSIS — Z1231 Encounter for screening mammogram for malignant neoplasm of breast: Secondary | ICD-10-CM

## 2013-03-03 ENCOUNTER — Ambulatory Visit
Admission: RE | Admit: 2013-03-03 | Discharge: 2013-03-03 | Disposition: A | Discharge status: Home / Self Care | Payer: Medicare Other | Source: Ambulatory Visit

## 2013-03-03 DIAGNOSIS — Z1231 Encounter for screening mammogram for malignant neoplasm of breast: Secondary | ICD-10-CM

## 2013-07-07 ENCOUNTER — Other Ambulatory Visit: Payer: Self-pay | Admitting: Geriatric Medicine

## 2013-07-07 DIAGNOSIS — J449 Chronic obstructive pulmonary disease, unspecified: Secondary | ICD-10-CM

## 2013-07-12 ENCOUNTER — Ambulatory Visit
Admission: RE | Admit: 2013-07-12 | Discharge: 2013-07-12 | Disposition: A | Discharge status: Home / Self Care | Payer: Commercial Managed Care - HMO | Source: Ambulatory Visit | Attending: Geriatric Medicine | Admitting: Geriatric Medicine

## 2013-07-12 DIAGNOSIS — J449 Chronic obstructive pulmonary disease, unspecified: Secondary | ICD-10-CM

## 2014-01-12 ENCOUNTER — Other Ambulatory Visit: Payer: Self-pay | Admitting: Geriatric Medicine

## 2014-01-12 DIAGNOSIS — R911 Solitary pulmonary nodule: Secondary | ICD-10-CM

## 2014-01-20 ENCOUNTER — Other Ambulatory Visit: Payer: Commercial Managed Care - HMO

## 2014-01-26 ENCOUNTER — Other Ambulatory Visit: Payer: Self-pay

## 2014-01-26 DIAGNOSIS — Z1231 Encounter for screening mammogram for malignant neoplasm of breast: Secondary | ICD-10-CM

## 2014-02-02 ENCOUNTER — Ambulatory Visit
Admission: RE | Admit: 2014-02-02 | Discharge: 2014-02-02 | Disposition: A | Discharge status: Home / Self Care | Payer: Commercial Managed Care - HMO | Source: Ambulatory Visit | Attending: Geriatric Medicine | Admitting: Geriatric Medicine

## 2014-02-02 DIAGNOSIS — R911 Solitary pulmonary nodule: Secondary | ICD-10-CM

## 2014-03-04 ENCOUNTER — Ambulatory Visit
Admission: RE | Admit: 2014-03-04 | Discharge: 2014-03-04 | Disposition: A | Discharge status: Home / Self Care | Payer: Commercial Managed Care - HMO | Source: Ambulatory Visit

## 2014-03-04 DIAGNOSIS — Z1231 Encounter for screening mammogram for malignant neoplasm of breast: Secondary | ICD-10-CM

## 2015-01-30 ENCOUNTER — Other Ambulatory Visit: Payer: Self-pay

## 2015-01-30 DIAGNOSIS — Z1231 Encounter for screening mammogram for malignant neoplasm of breast: Secondary | ICD-10-CM

## 2015-02-06 ENCOUNTER — Encounter: Payer: Self-pay | Admitting: Pulmonary Disease

## 2015-02-06 ENCOUNTER — Ambulatory Visit (INDEPENDENT_AMBULATORY_CARE_PROVIDER_SITE_OTHER)
Admission: RE | Admit: 2015-02-06 | Discharge: 2015-02-06 | Disposition: A | Discharge status: Home / Self Care | Payer: PPO | Source: Ambulatory Visit | Attending: Pulmonary Disease | Admitting: Pulmonary Disease

## 2015-02-06 ENCOUNTER — Ambulatory Visit (INDEPENDENT_AMBULATORY_CARE_PROVIDER_SITE_OTHER): Payer: PPO | Admitting: Pulmonary Disease

## 2015-02-06 ENCOUNTER — Other Ambulatory Visit (INDEPENDENT_AMBULATORY_CARE_PROVIDER_SITE_OTHER): Payer: PPO

## 2015-02-06 VITALS — BP 132/84 | HR 103 | Temp 98.3°F | Ht 67.0 in | Wt 251.2 lb

## 2015-02-06 DIAGNOSIS — R0600 Dyspnea, unspecified: Secondary | ICD-10-CM | POA: Insufficient documentation

## 2015-02-06 DIAGNOSIS — J439 Emphysema, unspecified: Secondary | ICD-10-CM

## 2015-02-06 DIAGNOSIS — J849 Interstitial pulmonary disease, unspecified: Secondary | ICD-10-CM | POA: Insufficient documentation

## 2015-02-06 LAB — BASIC METABOLIC PANEL
BUN: 13 mg/dL (ref 6–23)
CO2: 27 mEq/L (ref 19–32)
Calcium: 9.7 mg/dL (ref 8.4–10.5)
Chloride: 101 mEq/L (ref 96–112)
Creatinine, Ser: 0.95 mg/dL (ref 0.40–1.20)
GFR: 60.93 mL/min (ref >60.00)
GLUCOSE: 124 mg/dL — AB (ref 70–99)
POTASSIUM: 4.7 meq/L (ref 3.5–5.1)
Sodium: 138 mEq/L (ref 135–145)

## 2015-02-06 LAB — CBC WITH DIFFERENTIAL/PLATELET
BASOS PCT: 0.3 % (ref 0.0–3.0)
Basophils Absolute: 0.1 10*3/uL (ref 0.0–0.1)
EOS ABS: 0.3 10*3/uL (ref 0.0–0.7)
EOS PCT: 1.5 % (ref 0.0–5.0)
HEMATOCRIT: 44.4 % (ref 36.0–46.0)
Hemoglobin: 14.5 g/dL (ref 12.0–15.0)
Lymphocytes Relative: 16.5 % (ref 12.0–46.0)
Lymphs Abs: 3.2 10*3/uL (ref 0.7–4.0)
MCHC: 32.7 g/dL (ref 30.0–36.0)
MCV: 93.1 fl (ref 78.0–100.0)
MONO ABS: 1.7 10*3/uL — AB (ref 0.1–1.0)
Monocytes Relative: 8.5 % (ref 3.0–12.0)
NEUTROS ABS: 14.3 10*3/uL — AB (ref 1.4–7.7)
Neutrophils Relative %: 73.2 % (ref 43.0–77.0)
PLATELETS: 277 10*3/uL (ref 150.0–400.0)
RBC: 4.77 Mil/uL (ref 3.87–5.11)
RDW: 14.5 % (ref 11.5–15.5)
WBC: 19.5 10*3/uL (ref 4.0–10.5)

## 2015-02-06 LAB — HEPATIC FUNCTION PANEL
ALBUMIN: 3.7 g/dL (ref 3.5–5.2)
ALK PHOS: 89 U/L (ref 39–117)
ALT: 16 U/L (ref 0–35)
AST: 15 U/L (ref 0–37)
BILIRUBIN DIRECT: 0.1 mg/dL (ref 0.0–0.3)
TOTAL PROTEIN: 7.5 g/dL (ref 6.0–8.3)
Total Bilirubin: 0.6 mg/dL (ref 0.2–1.2)

## 2015-02-06 LAB — RHEUMATOID FACTOR: Rhuematoid fact SerPl-aCnc: 100 IU/mL — ABNORMAL HIGH (ref <=14)

## 2015-02-06 LAB — TSH: TSH: 6.41 u[IU]/mL — ABNORMAL HIGH (ref 0.35–4.50)

## 2015-02-06 LAB — SEDIMENTATION RATE: Sed Rate: 54 mm/hr — ABNORMAL HIGH (ref 0–22)

## 2015-02-06 LAB — C-REACTIVE PROTEIN: CRP: 4.5 mg/dL (ref 0.5–20.0)

## 2015-02-06 MED ORDER — LEVALBUTEROL HCL 1.25 MG/3ML IN NEBU: 1.2500 mg | INHALATION_SOLUTION | Freq: Three times a day (TID) | RESPIRATORY_TRACT | Status: DC

## 2015-02-06 MED ORDER — PREDNISONE 20 MG PO TABS
ORAL_TABLET | ORAL | Status: DC
Start: 2015-02-06 — End: 2015-03-13

## 2015-02-06 NOTE — Patient Instructions (Signed)
Odie-- it was great meeting you today...  We reviewed your previous and recent data and decided to do the following:    Recheck your routine CXR today    Check a lab panel to see if we can identify any type of inflammatory condition in your lungs...       We will contact you w/ the results when available...   Continue your NEBULIZER w/ Xopenex three times daily...  Follow this with the SYMBICORT- 2 sprays twice daily, AND the SPIRIVA once daily...  We are adding some PREDNISONE 20mg  tabs>>    Start w/ 2 tabs (40mg ) each AM for the next 2 weeks...    Then decrease to one tab (20mg ) each AM daily until our return visit in 4-6 weeks time...  Try to stay as active as possible...  Call for any questions...  Let's plan a follow up visit in 4-6wks, sooner if needed for problems.Marland KitchenMarland Kitchen

## 2015-02-06 NOTE — Progress Notes (Signed)
Subjective:     Patient ID: [name], female   DOB: [birth year], 74 y.o.   MRN: [id number]  HPI  ~  March 29, 2011:  Initial pulmonary consult w/ KC>        The patient is a 74 year old female who I've been asked to see for ongoing pulmonary issues. She has been given the diagnosis of COPD years ago, but also tells me that she was diagnosed with asthma in her late 58s and put on a nebulizer. The patient thinks that her breathing is worsening, and she has had 3-4 episodes this year that have been labeled as "acute exacerbation" her last flareup was the end of November, and she was treated with antibiotics and prednisone. The patient notes a one block dyspnea on exertion at a moderate pace on flat ground. She will also get winded bringing groceries in from the car, and doing any kind of light housework. She notes very significant reflux symptoms, despite being on a proton pump inhibitor. She notes a barking cough at night and also first thing in the morning upon arising. The patient currently is on Symbicort, and has taken this compliantly. She has had no recent chest x-ray or full pulmonary function studies. Of note, her weight is neutral over the last one year.       PLAN>> I suspect, with the patient's long history of tobacco abuse, that she probably does have some degree of chronic airflow obstruction. It is unclear how much asthma may be playing a role here. She has had multiple episodes this year that required antibiotics and prednisone, and I wonder how much of this is related to her underlying lung issues or possibly laryngopharyngeal reflux. She is having persistent reflux symptoms despite taking a proton pump inhibitor. She also describes cyclical coughing as well, which is typically an upper airway issue. I would like to check a chest x-ray today, and schedule her for full pulmonary function studies. I would also like to treat her reflux more aggressively over the next 3-4 weeks.  We'll also add Spiriva to her Symbicort to see if this helps  CXR 03/29/11 showed borderline heart size, ectasia of thoracic Ao, low flat diaphragms, essentially clear lungs, osteopenia & degen spondylosis in spine...  FullPFTs 05/17/11 showed FVC=3.11 (99%), FEV1=1.67 (74%), %1sec=54, mid-flows reduced at 18% predicted; no improvement in FEV1 post bronchodil; TLC=4.52 (84%), RV=1.41 (65%), RV/TLC=31%; DLCO=50% predicted.=> c/w mod airflow obstruction, borderline lung volumes, moderate decr in diffusion capacity...   ~  May 17, 2011:  ROV w/ KC>        Patient comes in today for followup after her pulmonary function studies. She also had a cough at the last visit that I felt was more upper airway in origin. She was started on a more aggressive regimen for reflux, but has not seen a big difference. Now, she is having a lot of sinus congestion and pressure, and tells me that she recently was started on CPAP. Her heater on the humidifier is set fairly low based on her just her a period she was also started on Spiriva at the last visit, and has seen improvement in her breathing since that time.      PLAN>>  The patient's cough seems more upper airway in origin, and I had tried her on a more intensified regimen for reflux disease. She saw some improvement, but not a lot. She is having a lot of nasal congestion and pressure, but has recently been started on  CPAP and has her heated humidity at a low level. I have asked her to turn the heat up on the humidifier, and have recommended sinus rinses a.m. and p.m. Until she improves. If she continues to have issues, it is possible that she may have a sinus infection. I would like to treat for reflux more aggressively for another 4 weeks, but if she does not see a big change would go back to her prior dose of proton pump inhibitor.      The patient has moderate airflow obstruction by pulmonary function studies today, and based on history, most consistent with  a combination of emphysema and asthma. She has seen considerable improvement with the addition of Spiriva, and therefore I will leave her on this along with her Symbicort. I stressed to her the importance of weight loss and conditioning, and recommended referral to pulmonary rehabilitation.  ~  November 15, 2011:  62mo ROV w/ KC>        Patient comes in today for followup of her known COPD. She has seen a significant improvement in her breathing with the addition of Spiriva. She has not had any acute exacerbation since last visit, nor has she required her rescue inhaler. Her cough is also much improved with b.i.d. Proton pump inhibitor, and also making adjustments on her CPAP humidifier.      PLAN>>  Much improved with increased dose of PPI, and also with adjustment of heater on her cpap machine.  The patient is doing well on Symbicort and Spiriva, and has not had any flareups since the last visit. I have stressed to her the importance of weight loss and some type of exercise program in order to improve her exertional tolerance. She also feels that her cough is much improved since being on b.i.d. Proton pump inhibitor, and we'll continue this. If she is doing well, she is to follow up in 6 months (Pt cancelled f/u appt 05/2012 & never rescheduled).   CT scans done by DrStoneking>   CT Chest 07/12/13 showed norm heart size, atherosclerotic changes in Ao & coronaries, no adenopathy; biapical pleuroparenchymal scarring & emphysema, scattered subpleural nodules- 81mm LUL, 89mm RUL, 93mm RML, 29mm RLL...   CT Chest 02/02/14 showed norm heart size, calcif atherosclerotic Ao & coronaries, no adenopathy, centrilob & paraseptal emphysema noted, mult small nonspecific pulm nodules- 44mm in LUL, 9mm in lingula, 45mm in RUL, 44mm in RML and no new or enlarging pulm lesions (stable pulm nodules)...   ~  February 06, 2015:  Initial pulm eval by SN>  Pt referred by DrStoneking for progressive dyspnea and abnormal CT  Chest... The patient's granddaughter is DrStoneking's nurse.      Pt presents very concerned about her pulmonary situation having recently had a 3rd CT Chest & was told that they detected more & bigger pulmonary nodules & she is scared that she might have cancer; she brought a disc w/ CT Chest done 01/26/15 at [facility] & on my review the scan looks different than prev- now w/ widespread patchy ground glass opac superimposed on her prev emphysema changes & mult small pulm nodules (now largely obscured by this interstitial process); I am concerned this new ILD may represent COP (cryptogenic organizing pneumonia- the new terminology for our prev BOOP diagnosis)... She is c/o cough, small amt beige sput w/o hemoptysis, notes SOB/DOE w/ exertion (she used to swim a lot but recently has been more sedentary- esp since knee surg 10/2014); notes SOB w/ housework x32mo &  stress makes the SOB worse, been using her Symbicort & Spiriva just "as needed" she says; DrStoneking got her a nebulizer w/ Xopenex to use TID but she has run out of this med...       In the course of our conversations Asjah tells me that she was prev diagnosed w/ Lupus by DrGates and DrLevitin years ago (1997) but we do not have any old data from this period (note from [facility] 02/01/15 just mentioned lung nodules and PMHx has no mention of SLE, prev abn blood tests, prev Rheum eval, etc);  The pt volunteers that she had a pos ANA & was treated by DrLevitin w/ Pred intermittently over a 3 yr period;  Why she stopped going & it was never mentioned again... She does admit to hx arthritis, hurting all over (esp knees), and she had arthroscopic knee surg by [facility] 10/26/14 "he scraped it out"; she had post op therapy  But she noted her breathing was worse...   Smoking Hx>  she is an ex-smoker, starting at age 61 & smoked for about 50 yrs up to 1.5ppd, & quit in 2003; est ~50-60 pack-yr smoking hx...  Pulmonary Hx>  She reports hx diphtheria as  child, had lots of recurrent bronchitic infections in her adult life, she reports several bouts of pneumonia & wonders if these infections left her w/ scar tissue, seen by DrClance 2013 w/ remote hx asthma/ now modCOPD treated w/ Symbicort, Spriva, PPI & antireflux regimen;  ?she had sleep eval 2013 & was started on CPAP (?[facility], no records avail)...  Medical Hx>  DrStoneking- HL, Obesity, GERD, Divertics, colon polyps, liver AVM, osteopenia, migraines, RLS, anxiety/depression...  Family Hx>  Hx asthma in her mother, otherw no FamHx lung problems...   Occup Hx>  No known occupational exposures- asbestos, chemical toxins, no recent travel or birds...  Current Meds>  Symbicort160, Spiriva, Xopenex neb, Mucinex, Claritin10, ASA81, Lisinopril5, Fish Oil, Prilosec20-2Bid, Celexa40, calcium/ vitamins...  EXAM - Afeb, VSS, O2sat=92% on RA;  Obese at 251#, 5'7"Tall, BMI=39;  HEENT- neg, mallampati2;  Chest- decr BS bilat, few basilar rales, no wheezing or consolidation;  Heart- RR gr1-2 SEM w/o r/g;  Abd- obese soft neg;  Ext- w/o c/c/e  CT Chest 01/26/15 by [facility]- report by Dr. Anabel Halon- indicates extensive bilat pulm fibrosis, mult pulm masses & nodules- neoplasia cannot be excluded, mult nodes in mediastinum- mostly wnl in size,   CXR 02/06/15 showed norm heart size, lungs appear hyperinflated w/ coarsened interstitial markings bilat & an opacity at left lung base (all this new compared to last CXR 2012)...  Spirometry 02/06/15 showed FVC=1.97 (62%), FEV1=1.40 (60%), %1sec=71%, mid-flows are reduced at 49% predicted;  This is suggestive of mixed mild obstructive & mod restrictive dis...  Ambulatory oxygen saturation test 01/2015> O2sat=92% on RA at rest w/ pulse=95/min;  She ambulated in office only 1 lap w/ lowest O2sat=88% w/ pulse=124/min...  LABS 01/2015>  Chems- wnl;  CBC- wnl x WBC=19.5;  TSH=6.41;  Collagen-vasc screen:  ACE=10 (nl<52), RheumFactor=100 (Hi pos >42),  Anti-CCP=213 (strong pos >59), ANA pos 1:320 homogeneous pattern, ANCA screen= NEG (MPO & PR-3), Sed=54, CRP=4.5.Marland KitchenMarland Kitchen  IMP/PLAN >>       New ILD per CXR & CT Chest- c/w COP/BOOP>  The totality of the eval including labs looks like this could be Rheumatoid lung dis; we will refer her to [facility] ASAP & start Pred rx...      Underlying COPD/emphysema, former smoker>  She is on  Symbicort160-2spBid, Spiriva daily, Xopenex nebs Tid, Mucinex and rec to take meds regularly, NOT prn...      Hx mult small pulmonary nodules seen on prev CT scans in 2013>  This will need to be followed w/ serial CXR/ CT scans...      [facility]- eval & Rx per [facility], no data avail in Epic>  Per DrStoneking & Maxwell Caul...      GERD/ prob LPR>  She needs a vigorous antireflux regimen w/ PPI Bid, NPO after dinner, elev HOB 6"; DrMJohnson is her GI...      Abn collagen-vasc screen w/ +RA, +Anti-CCP, +ANA, Sed54>  She was prev treated by DrLevitin for Lupus 1997-2000, labs look like RA & we will refer pt to [facility] for Rheum eval at this time...    Past Medical History  Diagnosis Date  . Diverticulosis   . GERD (gastroesophageal reflux disease)   . Colon polyps   . Obesity   . Migraine headache   . Hypercholesterolemia   . Asthma   . Asthmatic bronchitis   . H/O: GI bleed   . RLS (restless legs syndrome)   . Allergic rhinitis     Past Surgical History  Procedure Laterality Date  . Total abdominal hysterectomy  02/17/1989  . Tonsillectomy  x2    Outpatient Encounter Prescriptions as of 02/06/2015  Medication Sig  . aspirin 81 MG tablet Take 81 mg by mouth every other day.  . budesonide-formoterol (SYMBICORT) 160-4.5 MCG/ACT inhaler Inhale 2 puffs into the lungs 2 (two) times daily.   . Calcium Carbonate-Vitamin D (CALCIUM + D) 600-200 MG-UNIT TABS Take 1 tablet by mouth daily.    . citalopram (CELEXA) 40 MG tablet Take 40 mg by mouth daily.  Marland Kitchen guaiFENesin (MUCINEX) 600 MG 12 hr tablet Take 600 mg by mouth daily.    Marland Kitchen levalbuterol (XOPENEX) 1.25 MG/3ML nebulizer solution Take 1.25 mg by nebulization 3 (three) times daily.  Marland Kitchen lisinopril (PRINIVIL,ZESTRIL) 5 MG tablet 5 mg daily.  Marland Kitchen loratadine (CLARITIN) 10 MG tablet Take 10 mg by mouth daily.    . Omega-3 Fatty Acids (FISH OIL) 1200 MG CAPS Take 1 capsule by mouth daily.    Marland Kitchen omeprazole (PRILOSEC) 20 MG capsule Take 2 capsules by mouth twice daily (Patient taking differently: Take 20 mg by mouth daily. )  . tiotropium (SPIRIVA) 18 MCG inhalation capsule Place 1 capsule (18 mcg total) into inhaler and inhale daily.  . valACYclovir (VALTREX) 1000 MG tablet Take 1,000 mg by mouth daily as needed.    . [DISCONTINUED] ranitidine (ZANTAC) 300 MG tablet Take 300 mg by mouth at bedtime.    Allergies  Allergen Reactions  . Macrolides And Ketolides   . Penicillins     Facial numbness  . Sertraline Hcl   . Shellfish Allergy   . Sulfa Antibiotics     Immunization History  Administered Date(s) Administered  . Influenza-Unspecified 12/30/2014  . Pneumococcal Conjugate-13 07/07/2013  . Pneumococcal Polysaccharide-23 03/03/2009    Family History  Problem Relation Age of Onset  . Allergies Mother   . Allergies Father   . Asthma Mother   . Breast cancer Mother     Social History   Social History  . Marital Status: Widowed    Spouse Name: N/A  . Number of Children: Y  . Years of Education: N/A   Occupational History  . retired from office work    Social History Main Topics  . Smoking status: Former Smoker -- 1.00 packs/day for 45  years    Types: Cigarettes    Quit date: 04/15/2001  . Smokeless tobacco: Not on file  . Alcohol Use: No  . Drug Use: No  . Sexual Activity: Not on file   Other Topics Concern  . Not on file   Social History Narrative   Widowed. Lives alone.     Current Medications, Allergies, Past Medical History, Past Surgical History, Family History, and Social History were reviewed in [employer]  record.   Review of Systems             All symptoms NEG except where BOLDED >>  Constitutional:  F/C/S, fatigue, anorexia, unexpected weight change. HEENT:  HA, visual changes, hearing loss, earache, nasal symptoms, sore throat, mouth sores, hoarseness. Resp:  cough, sputum, hemoptysis; SOB, tightness, wheezing. Cardio:  CP, palpit, DOE, orthopnea, edema. GI:  N/V/D/C, blood in stool; reflux, abd pain, distention, gas. GU:  dysuria, freq, urgency, hematuria, flank pain, voiding difficulty. MS:  joint pain, swelling, tenderness, decr ROM; neck pain, back pain, etc. Neuro:  HA, tremors, seizures, dizziness, syncope, weakness, numbness, gait abn. Skin:  suspicious lesions or skin rash. Heme:  adenopathy, bruising, bleeding. Psyche:  confusion, agitation, sleep disturbance, hallucinations, anxiety, depression suicidal.   Objective:   Physical Exam       Vital Signs:  Reviewed...  General:  WD, obese, 74 y/o WF in NAD; alert & oriented; pleasant & cooperative... HEENT:  [place]/AT; Conjunctiva- pink, Sclera- nonicteric, EOM-wnl, PERRLA, EACs-clear, TMs-wnl; NOSE-clear; THROAT-clear & wnl. Neck:  Supple w/ fair ROM; no JVD; normal carotid impulses w/o bruits; no thyromegaly or nodules palpated; no lymphadenopathy. Chest:  Decr BS bilat, few basilar rales, no wheezing rhonchi or signs of consolidation... Heart:  Regular Rhythm; norm S1 & S2 gr1-2/6 SEM, w/o rubs or gallops detected. Abdomen:  Soft & nontender- no guarding or rebound; normal bowel sounds; no organomegaly or masses palpated. Ext:  decrROM; without deformities +arthritic changes; no varicose veins, +venous insuffic, tr edema;  Pulses intact w/o bruits. Neuro:  No focal neuro deficits; sensory testing normal; gait OK & balance OK. Derm:  No lesions noted; no rash etc. Lymph:  No cervical, supraclavicular, axillary, or inguinal adenopathy palpated.   Assessment:      IMP/PLAN >>       New ILD per CXR & CT Chest- c/w COP/BOOP>   The totality of the eval including labs looks like this could be Rheumatoid lung dis; we will refer her to [facility] ASAP & start Pred rx...      Underlying COPD/emphysema, former smoker>  She is on Symbicort160-2spBid, Spiriva daily, Xopenex nebs Tid, Mucinex and rec to take meds regularly, NOT prn...      Hx mult small pulmonary nodules seen on prev CT scans in 2013>  This will need to be followed w/ serial CXR/ CT scans...      [facility]- eval & Rx per [facility], no data avail in Epic>  Per DrStoneking & Maxwell Caul...      GERD/ prob LPR>  She needs a vigorous antireflux regimen w/ PPI Bid, NPO after dinner, elev HOB 6"; DrMJohnson is her GI...      Abn collagen-vasc screen w/ +RA, +Anti-CCP, +ANA, Sed54>  She was prev treated by DrLevitin for Lupus 1997-2000, labs look like RA & we will refer pt to [facility] for Rheum eval at this time...  PLAN >> Start Prednisone 20mg  tabs- 40mg  Qam for 2 wks, then 20mg /d til return;  Refer to [place], ASAP;  Continue other meds reglarly (Symbicort, Spiriva, Xopenex, Mucinex);  Needs vigorous antireflux regimen...     Plan:     Patient's Medications  New Prescriptions   PREDNISONE (DELTASONE) 20 MG TABLET    Use as directed by physician  Previous Medications   ASPIRIN 81 MG TABLET    Take 81 mg by mouth every other day.   BUDESONIDE-FORMOTEROL (SYMBICORT) 160-4.5 MCG/ACT INHALER    Inhale 2 puffs into the lungs 2 (two) times daily.    CALCIUM CARBONATE-VITAMIN D (CALCIUM + D) 600-200 MG-UNIT TABS    Take 1 tablet by mouth daily.     CITALOPRAM (CELEXA) 40 MG TABLET    Take 40 mg by mouth daily.   GUAIFENESIN (MUCINEX) 600 MG 12 HR TABLET    Take 600 mg by mouth daily.    LISINOPRIL (PRINIVIL,ZESTRIL) 5 MG TABLET    5 mg daily.   LORATADINE (CLARITIN) 10 MG TABLET    Take 10 mg by mouth daily.     OMEGA-3 FATTY ACIDS (FISH OIL) 1200 MG CAPS    Take 1 capsule by mouth daily.     OMEPRAZOLE (PRILOSEC) 20 MG CAPSULE    Take 2 capsules by mouth twice  daily   TIOTROPIUM (SPIRIVA) 18 MCG INHALATION CAPSULE    Place 1 capsule (18 mcg total) into inhaler and inhale daily.   VALACYCLOVIR (VALTREX) 1000 MG TABLET    Take 1,000 mg by mouth daily as needed.    Modified Medications   Modified Medication Previous Medication   LEVALBUTEROL (XOPENEX) 1.25 MG/3ML NEBULIZER SOLUTION levalbuterol (XOPENEX) 1.25 MG/3ML nebulizer solution      Take 1.25 mg by nebulization 3 (three) times daily.    Take 1.25 mg by nebulization 3 (three) times daily.   Discontinued Medications   RANITIDINE (ZANTAC) 300 MG TABLET    Take 300 mg by mouth at bedtime.

## 2015-02-07 ENCOUNTER — Other Ambulatory Visit: Payer: PPO

## 2015-02-07 DIAGNOSIS — J8409 Other alveolar and parieto-alveolar conditions: Secondary | ICD-10-CM

## 2015-02-07 DIAGNOSIS — J849 Interstitial pulmonary disease, unspecified: Principal | ICD-10-CM

## 2015-02-07 LAB — ANTI-NUCLEAR AB-TITER (ANA TITER): ANA Titer 1: 1:320 {titer} — ABNORMAL HIGH

## 2015-02-07 LAB — ANGIOTENSIN CONVERTING ENZYME: Angiotensin-Converting Enzyme: 10 U/L (ref 8–52)

## 2015-02-07 LAB — PAN-ANCA: ANCA SCREEN: NEGATIVE

## 2015-02-07 LAB — ANA: ANA: POSITIVE — AB

## 2015-02-08 ENCOUNTER — Other Ambulatory Visit: Payer: Self-pay | Admitting: Pulmonary Disease

## 2015-02-08 DIAGNOSIS — J849 Interstitial pulmonary disease, unspecified: Secondary | ICD-10-CM

## 2015-02-08 LAB — CYCLIC CITRUL PEPTIDE ANTIBODY, IGG: CYCLIC CITRULLIN PEPTIDE AB: 213 U — AB

## 2015-03-06 ENCOUNTER — Ambulatory Visit
Admission: RE | Admit: 2015-03-06 | Discharge: 2015-03-06 | Disposition: A | Discharge status: Home / Self Care | Payer: PPO | Source: Ambulatory Visit

## 2015-03-06 DIAGNOSIS — Z1231 Encounter for screening mammogram for malignant neoplasm of breast: Secondary | ICD-10-CM

## 2015-03-13 ENCOUNTER — Emergency Department (HOSPITAL_COMMUNITY)
Admission: EM | Admit: 2015-03-13 | Discharge: 2015-03-13 | Disposition: A | Discharge status: Home / Self Care | Payer: PPO | Attending: Emergency Medicine | Admitting: Emergency Medicine

## 2015-03-13 ENCOUNTER — Encounter (HOSPITAL_COMMUNITY): Payer: Self-pay | Admitting: Family Medicine

## 2015-03-13 ENCOUNTER — Emergency Department (HOSPITAL_COMMUNITY): Payer: PPO

## 2015-03-13 DIAGNOSIS — Z7951 Long term (current) use of inhaled steroids: Secondary | ICD-10-CM | POA: Insufficient documentation

## 2015-03-13 DIAGNOSIS — R600 Localized edema: Secondary | ICD-10-CM | POA: Diagnosis not present

## 2015-03-13 DIAGNOSIS — Z87891 Personal history of nicotine dependence: Secondary | ICD-10-CM | POA: Diagnosis not present

## 2015-03-13 DIAGNOSIS — Z7982 Long term (current) use of aspirin: Secondary | ICD-10-CM | POA: Diagnosis not present

## 2015-03-13 DIAGNOSIS — E7800 Pure hypercholesterolemia, unspecified: Secondary | ICD-10-CM | POA: Insufficient documentation

## 2015-03-13 DIAGNOSIS — Z79899 Other long term (current) drug therapy: Secondary | ICD-10-CM | POA: Diagnosis not present

## 2015-03-13 DIAGNOSIS — K5900 Constipation, unspecified: Secondary | ICD-10-CM | POA: Diagnosis not present

## 2015-03-13 DIAGNOSIS — G43909 Migraine, unspecified, not intractable, without status migrainosus: Secondary | ICD-10-CM | POA: Diagnosis not present

## 2015-03-13 DIAGNOSIS — Z8669 Personal history of other diseases of the nervous system and sense organs: Secondary | ICD-10-CM | POA: Diagnosis not present

## 2015-03-13 DIAGNOSIS — Z86010 Personal history of colonic polyps: Secondary | ICD-10-CM | POA: Insufficient documentation

## 2015-03-13 DIAGNOSIS — M2550 Pain in unspecified joint: Secondary | ICD-10-CM | POA: Insufficient documentation

## 2015-03-13 DIAGNOSIS — K219 Gastro-esophageal reflux disease without esophagitis: Secondary | ICD-10-CM | POA: Diagnosis not present

## 2015-03-13 DIAGNOSIS — R110 Nausea: Secondary | ICD-10-CM | POA: Diagnosis not present

## 2015-03-13 DIAGNOSIS — E669 Obesity, unspecified: Secondary | ICD-10-CM | POA: Insufficient documentation

## 2015-03-13 DIAGNOSIS — R0602 Shortness of breath: Secondary | ICD-10-CM | POA: Diagnosis present

## 2015-03-13 DIAGNOSIS — J441 Chronic obstructive pulmonary disease with (acute) exacerbation: Secondary | ICD-10-CM | POA: Insufficient documentation

## 2015-03-13 DIAGNOSIS — Z880 Allergy status to penicillin: Secondary | ICD-10-CM | POA: Diagnosis not present

## 2015-03-13 DIAGNOSIS — M069 Rheumatoid arthritis, unspecified: Secondary | ICD-10-CM | POA: Insufficient documentation

## 2015-03-13 HISTORY — DX: Chronic obstructive pulmonary disease, unspecified: J44.9

## 2015-03-13 LAB — CBC
HEMATOCRIT: 38.7 % (ref 36.0–46.0)
HEMOGLOBIN: 12.6 g/dL (ref 12.0–15.0)
MCH: 30.6 pg (ref 26.0–34.0)
MCHC: 32.6 g/dL (ref 30.0–36.0)
MCV: 93.9 fL (ref 78.0–100.0)
Platelets: 341 10*3/uL (ref 150–400)
RBC: 4.12 MIL/uL (ref 3.87–5.11)
RDW: 16 % — ABNORMAL HIGH (ref 11.5–15.5)
WBC: 14.7 10*3/uL — ABNORMAL HIGH (ref 4.0–10.5)

## 2015-03-13 LAB — URINALYSIS, ROUTINE W REFLEX MICROSCOPIC
BILIRUBIN URINE: NEGATIVE
Glucose, UA: NEGATIVE mg/dL
HGB URINE DIPSTICK: NEGATIVE
Ketones, ur: NEGATIVE mg/dL
Leukocytes, UA: NEGATIVE
NITRITE: NEGATIVE
PH: 6 (ref 5.0–8.0)
Protein, ur: NEGATIVE mg/dL
SPECIFIC GRAVITY, URINE: 1.017 (ref 1.005–1.030)

## 2015-03-13 LAB — BASIC METABOLIC PANEL
Anion gap: 12 (ref 5–15)
BUN: 12 mg/dL (ref 6–20)
CHLORIDE: 105 mmol/L (ref 101–111)
CO2: 24 mmol/L (ref 22–32)
Calcium: 9.4 mg/dL (ref 8.9–10.3)
Creatinine, Ser: 0.71 mg/dL (ref 0.44–1.00)
GFR calc non Af Amer: >60 mL/min (ref >60)
Glucose, Bld: 146 mg/dL — ABNORMAL HIGH (ref 65–99)
POTASSIUM: 4.2 mmol/L (ref 3.5–5.1)
SODIUM: 141 mmol/L (ref 135–145)

## 2015-03-13 LAB — I-STAT TROPONIN, ED: TROPONIN I, POC: 0.01 ng/mL (ref 0.00–0.08)

## 2015-03-13 MED ORDER — ACETAMINOPHEN 325 MG PO TABS
650.0000 mg | ORAL_TABLET | Freq: Once | ORAL | Status: AC
  Administered 2015-03-13: 650 mg via ORAL
  Filled 2015-03-13: qty 2

## 2015-03-13 MED ORDER — TRAMADOL HCL 50 MG PO TABS: 50.0000 mg | ORAL_TABLET | Freq: Four times a day (QID) | ORAL | Status: DC | PRN

## 2015-03-13 MED ORDER — TRAMADOL HCL 50 MG PO TABS
50.0000 mg | ORAL_TABLET | Freq: Once | ORAL | Status: AC
  Administered 2015-03-13: 50 mg via ORAL
  Filled 2015-03-13: qty 1

## 2015-03-13 NOTE — ED Provider Notes (Signed)
CSN: [id number]     Arrival date & time 03/13/15  1035 History   First MD Initiated Contact with Patient 03/13/15 1121     Chief Complaint  Patient presents with  . Foot Swelling  . Shortness of Breath    HPI   [name] is a 74 y.o. female with a PMH of diverticulosis, GERD, HLD, asthma, COPD, migraines, RA who presents to the ED with generalized body aches and weakness, hand and foot swelling, and shortness of breath, which she states has been present since Wednesday and has progressively worsened since that time. She reports exertion exacerbates her shortness of breath, though she states this is chronic. She has tried tylenol for her symptoms, which has provided some symptom relief. She denies fever, though reports chills. She denies HA, lightheadedness, dizziness, chest pain, abdominal pain. She reports nausea this morning, which she attributes to pain. She denies vomiting. She reports chronic constipation, and states this is unchanged from baseline.   Past Medical History  Diagnosis Date  . Diverticulosis   . GERD (gastroesophageal reflux disease)   . Colon polyps   . Obesity   . Migraine headache   . Hypercholesterolemia   . Asthma   . Asthmatic bronchitis   . H/O: GI bleed   . RLS (restless legs syndrome)   . Allergic rhinitis   . COPD (chronic obstructive pulmonary disease) [facility])    Past Surgical History  Procedure Laterality Date  . Total abdominal hysterectomy  02/17/1989  . Tonsillectomy  x2   Family History  Problem Relation Age of Onset  . Allergies Mother   . Allergies Father   . Asthma Mother   . Breast cancer Mother    Social History  Substance Use Topics  . Smoking status: Former Smoker -- 1.00 packs/day for 45 years    Types: Cigarettes    Quit date: 04/15/2001  . Smokeless tobacco: None  . Alcohol Use: No   OB History    No data available      Review of Systems  Constitutional: Negative for fever and chills.  Respiratory: Positive for  shortness of breath.   Cardiovascular: Positive for leg swelling. Negative for chest pain.  Gastrointestinal: Positive for nausea and constipation. Negative for vomiting, abdominal pain and diarrhea.  Musculoskeletal: Positive for arthralgias.  Neurological: Positive for weakness. Negative for dizziness, light-headedness and headaches.  All other systems reviewed and are negative.     Allergies  Macrolides and ketolides; Penicillins; Sertraline hcl; Shellfish allergy; and Sulfa antibiotics  Home Medications   Prior to Admission medications   Medication Sig Start Date End Date Taking? Authorizing Provider  acetaminophen (TYLENOL) 500 MG tablet Take 1,000 mg by mouth every 6 (six) hours as needed for moderate pain.   Yes Historical Provider, MD  Ascorbic Acid (VITAMIN C) 1000 MG tablet Take 1,000 mg by mouth daily.   Yes Historical Provider, MD  aspirin 81 MG tablet Take 81 mg by mouth every other day.   Yes Historical Provider, MD  budesonide-formoterol (SYMBICORT) 160-4.5 MCG/ACT inhaler Inhale 2 puffs into the lungs 2 (two) times daily.    Yes Historical Provider, MD  Calcium Carbonate-Vitamin D (CALCIUM + D) 600-200 MG-UNIT TABS Take 1 tablet by mouth daily.     Yes Historical Provider, MD  citalopram (CELEXA) 40 MG tablet Take 40 mg by mouth daily.   Yes Historical Provider, MD  guaiFENesin (MUCINEX) 600 MG 12 hr tablet Take 600 mg by mouth daily.  Yes Historical Provider, MD  levalbuterol (XOPENEX) 1.25 MG/3ML nebulizer solution Take 1.25 mg by nebulization 3 (three) times daily. 02/06/15  Yes Noralee Space, MD  lisinopril (PRINIVIL,ZESTRIL) 5 MG tablet Take 5 mg by mouth daily.  01/10/15  Yes Historical Provider, MD  loratadine (CLARITIN) 10 MG tablet Take 10 mg by mouth daily.     Yes Historical Provider, MD  Omega-3 Fatty Acids (FISH OIL) 1200 MG CAPS Take 1 capsule by mouth daily.     Yes Historical Provider, MD  omeprazole (PRILOSEC) 20 MG capsule Take 2 capsules by mouth twice  daily Patient taking differently: Take 20 mg by mouth daily.  11/15/11  Yes Kathee Delton, MD  predniSONE (STERAPRED UNI-PAK 21 TAB) 10 MG (21) TBPK tablet Take 10 mg by mouth daily.   Yes Historical Provider, MD  tiotropium (SPIRIVA) 18 MCG inhalation capsule Place 1 capsule (18 mcg total) into inhaler and inhale daily. 05/17/11  Yes Kathee Delton, MD  valACYclovir (VALTREX) 1000 MG tablet Take 1,000 mg by mouth daily as needed (for breakouts).    Yes Historical Provider, MD  traMADol (ULTRAM) 50 MG tablet Take 1 tablet (50 mg total) by mouth every 6 (six) hours as needed. 03/13/15   [place]upe Sabin Westfall, PA-C    BP 174/78 mmHg  Pulse 84  Temp(Src) 99 F (37.2 C)  Resp 15  Wt 114.93 kg  SpO2 96% Physical Exam  Constitutional: She is oriented to person, place, and time. She appears well-developed and well-nourished. No distress.  HENT:  Head: Normocephalic and atraumatic.  Right Ear: External ear normal.  Left Ear: External ear normal.  Nose: Nose normal.  Mouth/Throat: Uvula is midline, oropharynx is clear and moist and mucous membranes are normal.  Eyes: Conjunctivae, EOM and lids are normal. Pupils are equal, round, and reactive to light. Right eye exhibits no discharge. Left eye exhibits no discharge. No scleral icterus.  Neck: Normal range of motion. Neck supple.  Cardiovascular: Normal rate, regular rhythm, normal heart sounds, intact distal pulses and normal pulses.   Pulmonary/Chest: Effort normal and breath sounds normal. No respiratory distress. She has no wheezes. She has no rales.  Abdominal: Soft. Normal appearance and bowel sounds are normal. She exhibits no distension and no mass. There is no tenderness. There is no rigidity, no rebound and no guarding.  Musculoskeletal: Normal range of motion. She exhibits edema and tenderness.  Diffuse TTP of hands bilaterally with mild edema. TTP of dorsal aspect of feet bilaterally with no significant edema, erythema, or heat.   Neurological: She is alert and oriented to person, place, and time. No cranial nerve deficit or sensory deficit.  Decreased grip strength bilaterally due to pain. Lower extremity strength 5/5 bilaterally. Sensation intact.  Skin: Skin is warm, dry and intact. No rash noted. She is not diaphoretic. No erythema. No pallor.  Psychiatric: She has a normal mood and affect. Her speech is normal and behavior is normal.  Nursing note and vitals reviewed.   ED Course  Procedures (including critical care time)  Labs Review Labs Reviewed  CBC - Abnormal; Notable for the following:    WBC 14.7 (*)    RDW 16.0 (*)    All other components within normal limits  BASIC METABOLIC PANEL - Abnormal; Notable for the following:    Glucose, Bld 146 (*)    All other components within normal limits  URINALYSIS, ROUTINE W REFLEX MICROSCOPIC (NOT AT [facility])  Randolm Idol, ED    Imaging  Review Dg Chest 2 View  03/13/2015  CLINICAL DATA:  Short of breath EXAM: CHEST  2 VIEW COMPARISON:  02/06/2015 FINDINGS: COPD. Pulmonary hyperinflation. Prominent lung markings bilaterally compatible with chronic lung disease. Negative for acute pneumonia. Small area of density in the lower lobe on the lateral view overlying the spine has resolved and may have been an area of atelectasis or pneumonia on the prior study. Currently no evidence of pneumonia or effusion. Mild cardiac enlargement.  Negative for heart failure. IMPRESSION: COPD with prominent lung markings. No superimposed acute abnormality Resolution of lower lobe density seen on the prior study. Electronically Signed   By: Franchot Gallo M.D.   On: 03/13/2015 11:54     I have personally reviewed and evaluated these images and lab results as part of my medical decision-making.   EKG Interpretation   Date/Time:  Monday March 13 2015 10:44:16 EST Ventricular Rate:  106 PR Interval:  150 QRS Duration: 72 QT Interval:  338 QTC Calculation: 448 R Axis:    51 Text Interpretation:  Sinus tachycardia Otherwise normal ECG Since last  tracing rate faster Confirmed by WENTZ  MD, ELLIOTT [phone]) on 03/13/2015  1:15:24 PM      MDM   Final diagnoses:  Arthralgia  Shortness of breath    75 year old female presents with generalized body aches and weakness, hand and foot swelling, and shortness of breath since Wednesday. Denies fever, though reports chills. Denies HA, lightheadedness, dizziness, chest pain, abdominal pain. Reports nausea this morning. Denies vomiting. Reports chronic constipation, and states this is unchanged from baseline.  Patient is afebrile. Heart rate initially 103, improved to 80s. Heart regular rhythm. Lungs clear to auscultation bilaterally. Abdomen soft, nontender, nondistended. No rebound, guarding, or masses. Diffuse TTP of hands bilaterally with mild edema. TTP of dorsal aspect of feet bilaterally with no significant edema, erythema, or heat. Decreased grip strength bilaterally due to pain. Lower extremity strength 5/5 bilaterally. Sensation intact. Distal pulses intact.  CBC remarkable for leukocytosis of 14.7. BMP within normal limits. UA negative for infection.  EKG sinus tachycardia, HR 106. Troponin negative.  Chest x-ray remarkable for COPD with prominent lung markings, no superimposed acute abnormality.  Patient discussed with and seen by Dr. Eulis Foster. Symptoms likely related to rheumatoid disease. Leukocytosis may be related to steroid use. Patient has follow-up with her pulmonologist tomorrow and with her rheumatologist on Wednesday. Will discharge with pain medication (tramadol) for symptoms. Patient is non-toxic and well-appearing, feel she is stable for discharge at this time. Return precautions discussed. Patient verbalizes her understanding and is in agreement with plan.  BP 174/78 mmHg  Pulse 84  Temp(Src) 99 F (37.2 C)  Resp 15  Wt 114.93 kg  SpO2 96%      Marella Chimes, PA-C 03/13/15  2023  Daleen Bo, MD 03/13/15 2050

## 2015-03-13 NOTE — ED Provider Notes (Signed)
  Face-to-face evaluation   History: Patient is here for evaluation of swelling and pain in hands and feet, for 1 week. She also has ongoing shortness of breath, which is worsening. It is primarily associated with dyspnea on exertion. No chest pain. She has urinary frequency. No dysuria.  Physical exam: Alert, elderly female in mild pain. Heart regular rhythm, no murmur. Lungs clear daily. Strength is normal range of motion. Mild swelling. MCPs, bilaterally. No peripheral edema.  Evaluation is consistent with an arthralgia, and pulmonary nodules related to rheumatoid arthritis.  Medical screening examination/treatment/procedure(s) were conducted as a shared visit with non-physician practitioner(s) and myself.  I personally evaluated the patient during the encounter  Daleen Bo, MD 03/13/15 2050

## 2015-03-13 NOTE — ED Notes (Signed)
Pt Oxygen saturation ranges between 88-91% on room air. EDP Westfall aware, no new orders. Westfall sts that pt is safe to go home.

## 2015-03-13 NOTE — ED Notes (Signed)
Pt taken off O2 for a room air trial.

## 2015-03-13 NOTE — Discharge Instructions (Signed)
1. Medications: usual home medications 2. Treatment: rest, drink plenty of fluids 3. Follow Up: please followup with your pulmonologist and rheumatologist as scheduled for discussion of your diagnoses and further evaluation after today's visit; please return to the ER for severe chest pain, shortness of breath, new or worsening symptoms   Musculoskeletal Pain Musculoskeletal pain is muscle and boney aches and pains. These pains can occur in any part of the body. Your caregiver may treat you without knowing the cause of the pain. They may treat you if blood or urine tests, X-rays, and other tests were normal.  CAUSES There is often not a definite cause or reason for these pains. These pains may be caused by a type of germ (virus). The discomfort may also come from overuse. Overuse includes working out too hard when your body is not fit. Boney aches also come from weather changes. Bone is sensitive to atmospheric pressure changes. HOME CARE INSTRUCTIONS   Ask when your test results will be ready. Make sure you get your test results.  Only take over-the-counter or prescription medicines for pain, discomfort, or fever as directed by your caregiver. If you were given medications for your condition, do not drive, operate machinery or power tools, or sign legal documents for 24 hours. Do not drink alcohol. Do not take sleeping pills or other medications that may interfere with treatment.  Continue all activities unless the activities cause more pain. When the pain lessens, slowly resume normal activities. Gradually increase the intensity and duration of the activities or exercise.  During periods of severe pain, bed rest may be helpful. Lay or sit in any position that is comfortable.  Putting ice on the injured area.  Put ice in a bag.  Place a towel between your skin and the bag.  Leave the ice on for 15 to 20 minutes, 3 to 4 times a day.  Follow up with your caregiver for continued problems and  no reason can be found for the pain. If the pain becomes worse or does not go away, it may be necessary to repeat tests or do additional testing. Your caregiver may need to look further for a possible cause. SEEK IMMEDIATE MEDICAL CARE IF:  You have pain that is getting worse and is not relieved by medications.  You develop chest pain that is associated with shortness or breath, sweating, feeling sick to your stomach (nauseous), or throw up (vomit).  Your pain becomes localized to the abdomen.  You develop any new symptoms that seem different or that concern you. MAKE SURE YOU:   Understand these instructions.  Will watch your condition.  Will get help right away if you are not doing well or get worse.   This information is not intended to replace advice given to you by your health care provider. Make sure you discuss any questions you have with your health care provider.   Document Released: 04/01/2005 Document Revised: 06/24/2011 Document Reviewed: 12/04/2012 Elsevier Interactive Patient Education 2016 [place] of Breath Shortness of breath means you have trouble breathing. It could also mean that you have a medical problem. You should get immediate medical care for shortness of breath. CAUSES   Not enough oxygen in the air such as with high altitudes or a smoke-filled room.  Certain lung diseases, infections, or problems.  Heart disease or conditions, such as angina or heart failure.  Low red blood cells (anemia).  Poor physical fitness, which can cause shortness of breath when  you exercise.  Chest or back injuries or stiffness.  Being overweight.  Smoking.  Anxiety, which can make you feel like you are not getting enough air. DIAGNOSIS  Serious medical problems can often be found during your physical exam. Tests may also be done to determine why you are having shortness of breath. Tests may include:  Chest X-rays.  Lung function tests.  Blood  tests.  An electrocardiogram (ECG).  An ambulatory electrocardiogram. An ambulatory ECG records your heartbeat patterns over a 24-hour period.  Exercise testing.  A transthoracic echocardiogram (TTE). During echocardiography, sound waves are used to evaluate how blood flows through your heart.  A transesophageal echocardiogram (TEE).  Imaging scans. Your health care provider may not be able to find a cause for your shortness of breath after your exam. In this case, it is important to have a follow-up exam with your health care provider as directed.  TREATMENT  Treatment for shortness of breath depends on the cause of your symptoms and can vary greatly. HOME CARE INSTRUCTIONS   Do not smoke. Smoking is a common cause of shortness of breath. If you smoke, ask for help to quit.  Avoid being around chemicals or things that may bother your breathing, such as paint fumes and dust.  Rest as needed. Slowly resume your usual activities.  If medicines were prescribed, take them as directed for the full length of time directed. This includes oxygen and any inhaled medicines.  Keep all follow-up appointments as directed by your health care provider. SEEK MEDICAL CARE IF:   Your condition does not improve in the time expected.  You have a hard time doing your normal activities even with rest.  You have any new symptoms. SEEK IMMEDIATE MEDICAL CARE IF:   Your shortness of breath gets worse.  You feel light-headed, faint, or develop a cough not controlled with medicines.  You start coughing up blood.  You have pain with breathing.  You have chest pain or pain in your arms, shoulders, or abdomen.  You have a fever.  You are unable to walk up stairs or exercise the way you normally do. MAKE SURE YOU:  Understand these instructions.  Will watch your condition.  Will get help right away if you are not doing well or get worse.   This information is not intended to replace advice  given to you by your health care provider. Make sure you discuss any questions you have with your health care provider.   Document Released: 12/25/2000 Document Revised: 04/06/2013 Document Reviewed: 06/17/2011 Elsevier Interactive Patient Education [employer].

## 2015-03-13 NOTE — ED Notes (Signed)
Pt here for hand swelling, feet swelling, and SOB. Sts for a while. Pt seeing an RA doctor.

## 2015-03-14 ENCOUNTER — Encounter: Payer: Self-pay | Admitting: Pulmonary Disease

## 2015-03-14 ENCOUNTER — Ambulatory Visit (INDEPENDENT_AMBULATORY_CARE_PROVIDER_SITE_OTHER): Payer: PPO | Admitting: Pulmonary Disease

## 2015-03-14 VITALS — BP 126/68 | HR 93 | Temp 98.4°F | Wt 255.4 lb

## 2015-03-14 DIAGNOSIS — J849 Interstitial pulmonary disease, unspecified: Secondary | ICD-10-CM | POA: Diagnosis not present

## 2015-03-14 DIAGNOSIS — M05741 Rheumatoid arthritis with rheumatoid factor of right hand without organ or systems involvement: Secondary | ICD-10-CM

## 2015-03-14 DIAGNOSIS — J449 Chronic obstructive pulmonary disease, unspecified: Secondary | ICD-10-CM | POA: Diagnosis not present

## 2015-03-14 DIAGNOSIS — R0600 Dyspnea, unspecified: Secondary | ICD-10-CM | POA: Diagnosis not present

## 2015-03-14 DIAGNOSIS — M069 Rheumatoid arthritis, unspecified: Secondary | ICD-10-CM | POA: Insufficient documentation

## 2015-03-14 DIAGNOSIS — M05742 Rheumatoid arthritis with rheumatoid factor of left hand without organ or systems involvement: Secondary | ICD-10-CM

## 2015-03-14 NOTE — Patient Instructions (Signed)
Today we updated your med list in our EPIC system...    Continue your current medications the same...  Continue your PREDNISONE at 10mg /d for now... Continue the XOPENEX nebulizer 3 times daily, followed by the Symbicort twice daily & the Spiriva once daily... Continue the antireflux regimen...  Today we checked your oxygen saturation while walking on room air>     You dropped to 85% on RA w/ min activity & you will need home oxygen set up...    REC> 1-2L/min at rest and 2L/min w/ activity...   We will arrange for Full PFTs to be checked betw now & your next f/u appt in 1month.Marland KitchenMarland Kitchen

## 2015-03-14 NOTE — Progress Notes (Addendum)
Subjective:     Patient ID: [name], female   DOB: May 22, 1940, 74 y.o.   MRN: [id number]  HPI ~  March 29, 2011:  Initial pulmonary consult w/ KC>        The patient is a 74 year old female who I've been asked to see for ongoing pulmonary issues. She has been given the diagnosis of COPD years ago, but also tells me that she was diagnosed with asthma in her late 54s and put on a nebulizer. The patient thinks that her breathing is worsening, and she has had 3-4 episodes this year that have been labeled as "acute exacerbation" her last flareup was the end of November, and she was treated with antibiotics and prednisone. The patient notes a one block dyspnea on exertion at a moderate pace on flat ground. She will also get winded bringing groceries in from the car, and doing any kind of light housework. She notes very significant reflux symptoms, despite being on a proton pump inhibitor. She notes a barking cough at night and also first thing in the morning upon arising. The patient currently is on Symbicort, and has taken this compliantly. She has had no recent chest x-ray or full pulmonary function studies. Of note, her weight is neutral over the last one year.       PLAN>> I suspect, with the patient's long history of tobacco abuse, that she probably does have some degree of chronic airflow obstruction. It is unclear how much asthma may be playing a role here. She has had multiple episodes this year that required antibiotics and prednisone, and I wonder how much of this is related to her underlying lung issues or possibly laryngopharyngeal reflux. She is having persistent reflux symptoms despite taking a proton pump inhibitor. She also describes cyclical coughing as well, which is typically an upper airway issue. I would like to check a chest x-ray today, and schedule her for full pulmonary function studies. I would also like to treat her reflux more aggressively over the next 3-4 weeks.  We'll also add Spiriva to her Symbicort to see if this helps  CXR 03/29/11 showed borderline heart size, ectasia of thoracic Ao, low flat diaphragms, essentially clear lungs, osteopenia & degen spondylosis in spine...  FullPFTs 05/17/11 showed FVC=3.11 (99%), FEV1=1.67 (74%), %1sec=54, mid-flows reduced at 18% predicted; no improvement in FEV1 post bronchodil; TLC=4.52 (84%), RV=1.41 (65%), RV/TLC=31%; DLCO=50% predicted.=> c/w mod airflow obstruction, borderline lung volumes, moderate decr in diffusion capacity...   ~  May 17, 2011:  ROV w/ KC>        Patient comes in today for followup after her pulmonary function studies. She also had a cough at the last visit that I felt was more upper airway in origin. She was started on a more aggressive regimen for reflux, but has not seen a big difference. Now, she is having a lot of sinus congestion and pressure, and tells me that she recently was started on CPAP. Her heater on the humidifier is set fairly low based on her just her a period she was also started on Spiriva at the last visit, and has seen improvement in her breathing since that time.      PLAN>>  The patient's cough seems more upper airway in origin, and I had tried her on a more intensified regimen for reflux disease. She saw some improvement, but not a lot. She is having a lot of nasal congestion and pressure, but has recently been started on CPAP  and has her heated humidity at a low level. I have asked her to turn the heat up on the humidifier, and have recommended sinus rinses a.m. and p.m. Until she improves. If she continues to have issues, it is possible that she may have a sinus infection. I would like to treat for reflux more aggressively for another 4 weeks, but if she does not see a big change would go back to her prior dose of proton pump inhibitor.      The patient has moderate airflow obstruction by pulmonary function studies today, and based on history, most consistent with  a combination of emphysema and asthma. She has seen considerable improvement with the addition of Spiriva, and therefore I will leave her on this along with her Symbicort. I stressed to her the importance of weight loss and conditioning, and recommended referral to pulmonary rehabilitation.  ~  November 15, 2011:  49mo ROV w/ KC>        Patient comes in today for followup of her known COPD. She has seen a significant improvement in her breathing with the addition of Spiriva. She has not had any acute exacerbation since last visit, nor has she required her rescue inhaler. Her cough is also much improved with b.i.d. Proton pump inhibitor, and also making adjustments on her CPAP humidifier.      PLAN>>  Much improved with increased dose of PPI, and also with adjustment of heater on her cpap machine.  The patient is doing well on Symbicort and Spiriva, and has not had any flareups since the last visit. I have stressed to her the importance of weight loss and some type of exercise program in order to improve her exertional tolerance. She also feels that her cough is much improved since being on b.i.d. Proton pump inhibitor, and we'll continue this. If she is doing well, she is to follow up in 6 months (Pt cancelled f/u appt 05/2012 & never rescheduled).   CT scans done by DrStoneking>   CT Chest 07/12/13 showed norm heart size, atherosclerotic changes in Ao & coronaries, no adenopathy; biapical pleuroparenchymal scarring & emphysema, scattered subpleural nodules- 41mm LUL, 61mm RUL, 41mm RML, 64mm RLL...   CT Chest 02/02/14 showed norm heart size, calcif atherosclerotic Ao & coronaries, no adenopathy, centrilob & paraseptal emphysema noted, mult small nonspecific pulm nodules- 29mm in LUL, 4mm in lingula, 47mm in RUL, 42mm in RML and no new or enlarging pulm lesions (stable pulm nodules)...   ~  February 06, 2015:  Initial pulm eval by SN>  Pt referred by DrStoneking for progressive dyspnea and abnormal CT  Chest... The patient's granddaughter is DrStoneking's nurse.      Pt presents very concerned about her pulmonary situation having recently had a 3rd CT Chest & was told that they detected more & bigger pulmonary nodules & she is scared that she might have cancer; she brought a disc w/ CT Chest done 01/26/15 at [facility] & on my review the scan looks different than prev- now w/ widespread patchy ground glass opac superimposed on her prev emphysema changes & mult small pulm nodules (now largely obscured by this interstitial process); I am concerned this new ILD may represent COP (cryptogenic organizing pneumonia- the new terminology for our prev BOOP diagnosis)... She is c/o cough, small amt beige sput w/o hemoptysis, notes SOB/DOE w/ exertion (she used to swim a lot but recently has been more sedentary- esp since knee surg 10/2014); notes SOB w/ housework x58mo &  stress makes the SOB worse, been using her Symbicort & Spiriva just "as needed" she says; DrStoneking got her a nebulizer w/ Xopenex to use TID but she has run out of this med...       In the course of our conversations Henrietta tells me that she was prev diagnosed w/ Lupus by DrGates and DrLevitin years ago (1997) but we do not have any old data from this period (note from [facility] 02/01/15 just mentioned lung nodules and PMHx has no mention of SLE, prev abn blood tests, prev Rheum eval, etc);  The pt volunteers that she had a pos ANA & was treated by DrLevitin w/ Pred intermittently over a 3 yr period;  ?why she stopped going & it was never mentioned again... She does admit to hx arthritis, hurting all over (esp knees), and she had arthroscopic knee surg by [facility] 10/26/14 "he scraped it out"; she had post op therapy  But she noted her breathing was worse...   Smoking Hx>  she is an ex-smoker, starting at age 71 & smoked for about 50 yrs up to 1.5ppd, & quit in 2003; est ~50-60 pack-yr smoking hx...  Pulmonary Hx>  She reports hx diphtheria as  child, had lots of recurrent bronchitic infections in her adult life, she reports several bouts of pneumonia & wonders if these infections left her w/ scar tissue, seen by DrClance 2013 w/ remote hx asthma/ now modCOPD treated w/ Symbicort, Spriva, PPI & antireflux regimen;  ?she had sleep eval 2013 & was started on CPAP (?[facility], no records avail)...  Medical Hx>  DrStoneking- HL, Obesity, GERD, Divertics, colon polyps, liver AVM, osteopenia, migraines, RLS, anxiety/depression...  Family Hx>  Hx asthma in her mother, otherw no FamHx lung problems...   Occup Hx>  No known occupational exposures- asbestos, chemical toxins, no recent travel or birds...  Current Meds>  Symbicort160, Spiriva, Xopenex neb, Mucinex, Claritin10, ASA81, Lisinopril5, Fish Oil, Prilosec20-2Bid, Celexa40, calcium/ vitamins... EXAM - Afeb, VSS, O2sat=92% on RA;  Obese at 251#, 5'7"Tall, BMI=39;  HEENT- neg, mallampati2;  Chest- decr BS bilat, few basilar rales, no wheezing or consolidation;  Heart- RR gr1-2 SEM w/o r/g;  Abd- obese soft neg;  Ext- w/o c/c/e  CT Chest 01/26/15 by [facility]- report by Dr. Anabel Halon- indicates extensive bilat pulm fibrosis, mult pulm masses & nodules- neoplasia cannot be excluded, mult nodes in mediastinum- mostly wnl in size... To my review this shows widespread patchy ground glass opac superimposed on her prev emphysema changes & mult small pulm nodules (now largely obscured by this interstitial process); I am concerned this new ILD may represent COP.   CXR 02/06/15 showed norm heart size, lungs appear hyperinflated w/ coarsened interstitial markings bilat & an opacity at left lung base (all this new compared to last CXR 2012)...  Spirometry 02/06/15 showed FVC=1.97 (62%), FEV1=1.40 (60%), %1sec=71%, mid-flows are reduced at 49% predicted;  This is suggestive of mixed mild obstructive & mod restrictive dis...  Ambulatory oxygen saturation test 01/2015> O2sat=92% on RA at rest  w/ pulse=95/min;  She ambulated in office only 1 lap w/ lowest O2sat=88% w/ pulse=124/min...  LABS 01/2015>  Chems- wnl;  CBC- wnl x WBC=19.5;  TSH=6.41;  Collagen-vasc screen:  ACE=10 (nl<52), RheumFactor=100 (Hi pos >42), Anti-CCP=213 (strong pos >59), ANA pos 1:320 homogeneous pattern, ANCA screen= NEG (MPO & PR-3), Sed=54, CRP=4.5.Marland KitchenMarland Kitchen IMP/PLAN >>       New ILD per CXR & CT Chest- c/w COP/BOOP>  The totality of the  eval including labs looks like this could be Rheumatoid lung dis; we will refer her to [facility] ASAP & start Pred rx...      Underlying COPD/emphysema, former smoker>  She is on Symbicort160-2spBid, Spiriva daily, Xopenex nebs Tid, Mucinex and rec to take meds regularly, NOT prn...      Hx mult small pulmonary nodules seen on prev CT scans in 2013>  This will need to be followed w/ serial CXR/ CT scans...      [facility]A- eval & Rx per [facility], no data avail in Epic>  Per DrStoneking & Maxwell Caul...      GERD/ prob LPR>  She needs a vigorous antireflux regimen w/ PPI Bid, NPO after dinner, elev HOB 6"; DrMJohnson is her GI...      Abn collagen-vasc screen w/ +RA, +Anti-CCP, +ANA, Sed54>  She was prev treated by DrLevitin for Lupus 1997-2000, labs look like RA & we will refer pt to [facility] for Rheum eval at this time...   ~  March 14, 2015:  15mo ROV w/ SN>        At the time of our initial visit we started PRED40 x2wks=> 20 x2wks w/ f/u appt 47mo;  She saw [facility] for Rheum in the interval but we don't have note from her to review;  Pt tells me that she has Hx OA w/ prev knee arthroscopy by DrCaffrey & presist w/ lots of knee pain, but she claims that while she was taking the Pred20 she suddenly developed severe pain & swelling in her hands, wrists and ankles=> called DrHawkes who wanted to get her off the Pred & weaned her down further to 10mg /d- recall that we started the Pred for prob Rheumatoid Lung w/ CT scan suggesting COP/BOOP pattern;  She further indicates to me that she fell out  of bed 2wks ago- bruised leg & arm notes it's hard to walk w/ pain in feet & legs;  She called DStoneking's office & was switched from Aleve to Tylenol;  It got so bad she went to the ER yest=> given Tramadol &told to f/u here;  She has a follow up appt w/ [facility] tomorrow 11/30...      From the pulmonary standpoint she is about the same, but the pain in her hands/ wrist has been her main complaint for the last week or so;  She remains on Symbicort160-2spBid, Spiriva daily, NEBS w/ Xopenex tid, Mucinex prn;  She has had trouble doing the inhalers since her hands &wrist swelled up, pain w/ actuation;  She is SOB & O2sat=90% on RA today in office;  Notes sl dry cough, no sput, no hemoptysis, no CP...       EXAM shows Afeb, VSS, O2sat=90% on RA;  HEENT- neg, mallampati2;  Chest- decr BS bilat, few basilar rales, no wheezing or consolidation;  Heart- RR gr1-2 SEM w/o r/g;  Abd- obese soft neg;  Ext- swollen/ tender wrists & hands!  CXR 03/13/15 in ER> mild cardiomeg, COPD/ hyperinflation, prominent lung markings bilat, no acute changes...   EKG 03/13/15 in ER> sinus tachy, rate 106/min, NSSTTWA, NAD...  Ambulatory O2sat test 03/14/15> O2sat=91% on RA at rest w/ pulse=86/min; on standing w/ min walk O2sat dropped to 85% w/ pulse=106/min... 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      Underlying COPD/emphysema, former smoker>  She is on Symbicort160-2spBid, Spiriva daily, Xopenex nebs Tid, Mucinex and rec to take meds regularly everyday...      Hx mult small pulmonary nodules seen on prev CT scans in 2015> recent CT 01/2015 showed these nodules were masked by the ground glass opac & interstitial process; this will need to be followed  w/ serial CXR/ CT scans...      [facility]A- eval & Rx per [facility], no data avail in Epic>  Per DrStoneking & Maxwell Caul...      GERD/ prob LPR>  She needs a vigorous antireflux regimen w/ PPI Bid, NPO after dinner, elev HOB 6"; DrMJohnson is her GI...      Abn collagen-vasc screen w/ +RA, +Anti-CCP, +ANA, Sed54> Looks like RA-  She was prev treated by DrLevitin for Lupus 1997-2000;  She was referred to [facility] for Rheum & her note is pending... PLAN >>       She is asked to keep the Pred at 10mg  per day for now due to her pulmonary process;  She has ROV tomorrow w/ Rheum for follow up & to eval her acute hand, wrist, foot arthritic process;  We are starting HOME O2 due to her acute hypoxemic resp failure... We will follow up in 35month, sooner if needed.  ADDENDUM>> Pt seen by [facility] 11/30-- seropos RA, OA of both knees, ILD;  Insurance won't cover TNFs therefore started on ARAVA20 and Pred increased to 20mg /d... ADDENDUM>> Full PFTs 03/30/15:  FVC=2.23 (73%), FEV1=1.50 (65%), %1sec=68, mid-flows reduced at 51% predicted; post bronchodil the FEV1 improved 6%; TLC=4.13 (77%), RV=1.80 (75%), RV/TLC=44; DLCO=50% predicted;  These PFTs are compatible w/ mild restriction and a superimposed mild obstructive defect w/ a mod-severe reduction is DLCO...   Past Medical History  Diagnosis Date  . Diverticulosis   . GERD (gastroesophageal reflux disease)   . Colon polyps   . Obesity   . Migraine headache   . Hypercholesterolemia   . Asthma   . Asthmatic bronchitis   . H/O: GI bleed   . RLS (restless legs syndrome)   . Allergic rhinitis   . COPD (chronic obstructive pulmonary disease) [facility])     Past Surgical History  Procedure Laterality Date  . Total abdominal hysterectomy  02/17/1989  . Tonsillectomy  x2    Outpatient Encounter Prescriptions as of 02/06/2015  Medication Sig  . aspirin 81 MG tablet Take 81 mg by mouth every other day.  . budesonide-formoterol (SYMBICORT) 160-4.5 MCG/ACT  inhaler Inhale 2 puffs into the lungs 2 (two) times daily.   . Calcium Carbonate-Vitamin D (CALCIUM + D) 600-200 MG-UNIT TABS Take 1 tablet by mouth daily.    . citalopram (CELEXA) 40 MG tablet Take 40 mg by mouth daily.  Marland Kitchen guaiFENesin (MUCINEX) 600 MG 12 hr tablet Take 600 mg by mouth daily.   Marland Kitchen levalbuterol (XOPENEX) 1.25 MG/3ML nebulizer solution Take 1.25 mg by nebulization 3 (three) times daily.  Marland Kitchen lisinopril (PRINIVIL,ZESTRIL) 5 MG tablet 5 mg daily.  Marland Kitchen loratadine (CLARITIN) 10 MG tablet Take 10 mg by mouth daily.    . Omega-3 Fatty Acids (FISH OIL) 1200 MG CAPS Take 1 capsule by mouth daily.    Marland Kitchen omeprazole (PRILOSEC) 20 MG capsule Take 2 capsules by mouth twice daily (Patient taking differently: Take 20 mg by mouth daily. )  . tiotropium (SPIRIVA) 18 MCG inhalation  capsule Place 1 capsule (18 mcg total) into inhaler and inhale daily.  . valACYclovir (VALTREX) 1000 MG tablet Take 1,000 mg by mouth daily as needed.    . [DISCONTINUED] ranitidine (ZANTAC) 300 MG tablet Take 300 mg by mouth at bedtime.    Allergies  Allergen Reactions  . Macrolides And Ketolides   . Penicillins     Facial numbness  . Sertraline Hcl   . Shellfish Allergy   . Sulfa Antibiotics     Immunization History  Administered Date(s) Administered  . Influenza-Unspecified 12/30/2014  . Pneumococcal Conjugate-13 07/07/2013  . Pneumococcal Polysaccharide-23 03/03/2009    Current Medications, Allergies, Past Medical History, Past Surgical History, Family History, and Social History were reviewed in [employer] record.   Review of Systems             All symptoms NEG except where BOLDED >>  Constitutional:  F/C/S, fatigue, anorexia, unexpected weight change. HEENT:  HA, visual changes, hearing loss, earache, nasal symptoms, sore throat, mouth sores, hoarseness. Resp:  cough, sputum, hemoptysis; SOB, tightness, wheezing. Cardio:  CP, palpit, DOE, orthopnea, edema. GI:  N/V/D/C, blood  in stool; reflux, abd pain, distention, gas. GU:  dysuria, freq, urgency, hematuria, flank pain, voiding difficulty. MS:  joint pain, swelling, tenderness, decr ROM; neck pain, back pain, etc. Neuro:  HA, tremors, seizures, dizziness, syncope, weakness, numbness, gait abn. Skin:  suspicious lesions or skin rash. Heme:  adenopathy, bruising, bleeding. Psyche:  confusion, agitation, sleep disturbance, hallucinations, anxiety, depression suicidal.   Objective:   Physical Exam       Vital Signs:  Reviewed...  General:  WD, obese, 74 y/o WF in NAD; alert & oriented; pleasant & cooperative... HEENT:  /AT; Conjunctiva- pink, Sclera- nonicteric, EOM-wnl, PERRLA, EACs-clear, TMs-wnl; NOSE-clear; THROAT-clear & wnl. Neck:  Supple w/ fair ROM; no JVD; normal carotid impulses w/o bruits; no thyromegaly or nodules palpated; no lymphadenopathy. Chest:  Decr BS bilat, few basilar rales, no wheezing rhonchi or signs of consolidation... Heart:  Regular Rhythm; norm S1 & S2 gr1-2/6 SEM, w/o rubs or gallops detected. Abdomen:  Soft & nontender- no guarding or rebound; normal bowel sounds; no organomegaly or masses palpated. Ext:  decrROM; without deformities +arthritic changes; no varicose veins, +venous insuffic, tr edema;  Pulses intact w/o bruits. Neuro:  No focal neuro deficits; sensory testing normal; gait OK & balance OK. Derm:  No lesions noted; no rash etc. Lymph:  No cervical, supraclavicular, axillary, or inguinal adenopathy palpated.   Assessment:      IMP >>      New ILD per CXR & CT Chest- c/w COP/BOOP, prob related to Reumatoid Lung>  We started Pred 40=>20 but this was further cut to 10mg /d by [facility] in the interval; pulm status is clearly no better, ?sl worse, and she needs HOME O2 to be started rec 1-2L/min rest & 2L/min exercise => hypoxemic resp failure...      Underlying COPD/emphysema, former smoker>  She is on Symbicort160-2spBid, Spiriva daily, Xopenex nebs Tid, Mucinex and rec  to take meds regularly everyday...      Hx mult small pulmonary nodules seen on prev CT scans in 2015> recent CT 01/2015 showed these nodules were masked by the ground glass opac & interstitial process; this will need to be followed w/ serial CXR/ CT scans...      [facility]A- eval & Rx per [facility], no data avail in Epic>  Per DrStoneking & Maxwell Caul...      GERD/ prob LPR>  She needs a vigorous antireflux regimen w/ PPI Bid, NPO after dinner, elev HOB 6"; DrMJohnson is her GI...      Abn collagen-vasc screen w/ +RA, +Anti-CCP, +ANA, Sed54> Looks like RA-  She was prev treated by DrLevitin for Lupus 1997-2000;  She was referred to [facility] for Rheum & her note is pending...  PLAN >>  10/28:  Start Prednisone 20mg  tabs- 40mg  Qam for 2 wks, then 20mg /d til return;  Refer to [place], ASAP;  Continue other meds reglarly (Symbicort, Spiriva, Xopenex, Mucinex);  Needs vigorous antireflux regimen... 11.29:  She is asked to keep the Pred at 10mg  per day for now due to her pulmonary process;  She has ROV tomorrow w/ Rheum for follow up & to eval her acute hand, wrist, foot arthritic process;  We are starting HOME O2 due to her acute hypoxemic resp failure... We will follow up in 72month, sooner if needed.     Plan:     Patient's Medications  New Prescriptions      We are starting HOME O2 at 1-2L/min at rest and 2L/min w/ activity...  Previous Medications   ACETAMINOPHEN (TYLENOL) 500 MG TABLET    Take 1,000 mg by mouth every 6 (six) hours as needed for moderate pain.   ASCORBIC ACID (VITAMIN C) 1000 MG TABLET    Take 1,000 mg by mouth daily.   ASPIRIN 81 MG TABLET    Take 81 mg by mouth every other day.   BUDESONIDE-FORMOTEROL (SYMBICORT) 160-4.5 MCG/ACT INHALER    Inhale 2 puffs into the lungs 2 (two) times daily.    CALCIUM CARBONATE-VITAMIN D (CALCIUM + D) 600-200 MG-UNIT TABS    Take 1 tablet by mouth daily.     CITALOPRAM (CELEXA) 40 MG TABLET    Take 40 mg by mouth daily.   GUAIFENESIN  (MUCINEX) 600 MG 12 HR TABLET    Take 600 mg by mouth daily.    LEVALBUTEROL (XOPENEX) 1.25 MG/3ML NEBULIZER SOLUTION    Take 1.25 mg by nebulization 3 (three) times daily.   LISINOPRIL (PRINIVIL,ZESTRIL) 5 MG TABLET    Take 5 mg by mouth daily.    LORATADINE (CLARITIN) 10 MG TABLET    Take 10 mg by mouth daily.     OMEGA-3 FATTY ACIDS (FISH OIL) 1200 MG CAPS    Take 1 capsule by mouth daily.     OMEPRAZOLE (PRILOSEC) 20 MG CAPSULE    Take 2 capsules by mouth twice daily   PREDNISONE 10 MG TABLET    Take 10 mg by mouth daily.   TIOTROPIUM (SPIRIVA) 18 MCG INHALATION CAPSULE    Place 1 capsule (18 mcg total) into inhaler and inhale daily.   TRAMADOL (ULTRAM) 50 MG TABLET    Take 1 tablet (50 mg total) by mouth every 6 (six) hours as needed.   VALACYCLOVIR (VALTREX) 1000 MG TABLET    Take 1,000 mg by mouth daily as needed (for breakouts).   Modified Medications   No medications on file  Discontinued Medications   No medications on file

## 2015-03-20 ENCOUNTER — Encounter: Payer: Self-pay | Admitting: Pulmonary Disease

## 2015-03-20 ENCOUNTER — Other Ambulatory Visit: Payer: Self-pay | Admitting: Pulmonary Disease

## 2015-03-30 ENCOUNTER — Ambulatory Visit (INDEPENDENT_AMBULATORY_CARE_PROVIDER_SITE_OTHER): Payer: PPO | Admitting: Pulmonary Disease

## 2015-03-30 DIAGNOSIS — J449 Chronic obstructive pulmonary disease, unspecified: Secondary | ICD-10-CM

## 2015-03-30 DIAGNOSIS — J849 Interstitial pulmonary disease, unspecified: Secondary | ICD-10-CM

## 2015-03-30 LAB — PULMONARY FUNCTION TEST
DL/VA % PRED: 76 %
DL/VA: 3.87 ml/min/mmHg/L
DLCO UNC % PRED: 50 %
DLCO UNC: 13.5 ml/min/mmHg
FEF 25-75 POST: 1 L/s
FEF 25-75 Pre: 0.9 L/sec
FEF2575-%Change-Post: 10 %
FEF2575-%PRED-POST: 56 %
FEF2575-%PRED-PRE: 51 %
FEV1-%CHANGE-POST: 6 %
FEV1-%PRED-PRE: 65 %
FEV1-%Pred-Post: 70 %
FEV1-Post: 1.61 L
FEV1-Pre: 1.5 L
FEV1FVC-%Change-Post: 3 %
FEV1FVC-%Pred-Pre: 90 %
FEV6-%CHANGE-POST: 4 %
FEV6-%PRED-POST: 79 %
FEV6-%PRED-PRE: 75 %
FEV6-POST: 2.29 L
FEV6-PRE: 2.2 L
FEV6FVC-%CHANGE-POST: 0 %
FEV6FVC-%PRED-POST: 105 %
FEV6FVC-%Pred-Pre: 104 %
FVC-%Change-Post: 2 %
FVC-%Pred-Post: 75 %
FVC-%Pred-Pre: 73 %
FVC-Post: 2.29 L
FVC-Pre: 2.23 L
POST FEV1/FVC RATIO: 70 %
Post FEV6/FVC ratio: 100 %
Pre FEV1/FVC ratio: 68 %
Pre FEV6/FVC Ratio: 100 %
RV % pred: 75 %
RV: 1.8 L
TLC % PRED: 77 %
TLC: 4.13 L

## 2015-03-30 NOTE — Progress Notes (Signed)
PFT done today. 

## 2015-04-19 ENCOUNTER — Ambulatory Visit: Payer: PPO | Admitting: Pulmonary Disease

## 2015-04-19 DIAGNOSIS — Z79899 Other long term (current) drug therapy: Secondary | ICD-10-CM | POA: Diagnosis not present

## 2015-04-19 DIAGNOSIS — M170 Bilateral primary osteoarthritis of knee: Secondary | ICD-10-CM | POA: Diagnosis not present

## 2015-04-19 DIAGNOSIS — M0579 Rheumatoid arthritis with rheumatoid factor of multiple sites without organ or systems involvement: Secondary | ICD-10-CM | POA: Diagnosis not present

## 2015-04-19 DIAGNOSIS — J849 Interstitial pulmonary disease, unspecified: Secondary | ICD-10-CM | POA: Diagnosis not present

## 2015-04-19 DIAGNOSIS — M2550 Pain in unspecified joint: Secondary | ICD-10-CM | POA: Diagnosis not present

## 2015-04-25 ENCOUNTER — Telehealth: Payer: Self-pay | Admitting: Pulmonary Disease

## 2015-04-25 ENCOUNTER — Ambulatory Visit: Payer: PPO | Admitting: Pulmonary Disease

## 2015-04-25 NOTE — Telephone Encounter (Signed)
Pt calling again about previous conversation with Caryl Pina in the other 1.10.17 phone note Spoke with patient who stated she was told our office needs to complete a CMN - advised pt will call [place] to verify and see if they can fax again  [facility] and spoke with Jeani Hawking who reports all claims are on hold waiting for this CMN for pt's O2 and they are also waiting on an order for best-fit eval (for a portable concentrator).  Jeani Hawking tried to connect me with respiratory who would be able to tell me when/what fax they sent the CMN to and to ask that they send it again to ensure that it is indeed received and given to SN.  Respiratory was gone for the day but Jeani Hawking left a message for them to call us back.  Called spoke with patient and informed her of the above conversation.  Pr voiced her understanding and is okay with a call back tomorrow.

## 2015-04-25 NOTE — Telephone Encounter (Signed)
Spoke with pt, states that [facility] brought out 02 that SN ordered for pt, but [facility] is needing further paperwork from SN to keep pt from having to pay a monthly charge for her 02.   Called [facility], states that pt has no balance on her account-states that pt was told this today by another [facility] representative. Called pt, advised her of the above.  Nothing further needed at this time.

## 2015-04-26 NOTE — Telephone Encounter (Signed)
lmtcb x2 for Respiratory at [facility].

## 2015-04-27 NOTE — Telephone Encounter (Signed)
Spoke with Cecille Rubin at [facility], states she will be faxing CMN to our office for SN to fill out.  Verified fax # on file.  Will await fax.

## 2015-04-28 NOTE — Telephone Encounter (Signed)
Checked SN's look at box, it's not there. Rodena Piety do you have this CMN?

## 2015-05-01 NOTE — Telephone Encounter (Signed)
I have found the CMN. It was in my box from where I was not here on Friday and I will give it to Dr. Lenna Gilford to sign today

## 2015-05-01 NOTE — Telephone Encounter (Signed)
I don't know everything I have for Dr. Lenna Gilford is in his CMN folder and I don't see it on his cart and Daneil Dan is not here for me to ask about the folder but I will check with Dr. Lenna Gilford when he comes out of the room

## 2015-05-15 DIAGNOSIS — J849 Interstitial pulmonary disease, unspecified: Secondary | ICD-10-CM | POA: Diagnosis not present

## 2015-05-15 DIAGNOSIS — R0600 Dyspnea, unspecified: Secondary | ICD-10-CM | POA: Diagnosis not present

## 2015-05-15 DIAGNOSIS — G4733 Obstructive sleep apnea (adult) (pediatric): Secondary | ICD-10-CM | POA: Diagnosis not present

## 2015-05-15 DIAGNOSIS — M05741 Rheumatoid arthritis with rheumatoid factor of right hand without organ or systems involvement: Secondary | ICD-10-CM | POA: Diagnosis not present

## 2015-05-15 DIAGNOSIS — J449 Chronic obstructive pulmonary disease, unspecified: Secondary | ICD-10-CM | POA: Diagnosis not present

## 2015-05-15 DIAGNOSIS — M05742 Rheumatoid arthritis with rheumatoid factor of left hand without organ or systems involvement: Secondary | ICD-10-CM | POA: Diagnosis not present

## 2015-05-15 DIAGNOSIS — J439 Emphysema, unspecified: Secondary | ICD-10-CM | POA: Diagnosis not present

## 2015-05-23 DIAGNOSIS — M0579 Rheumatoid arthritis with rheumatoid factor of multiple sites without organ or systems involvement: Secondary | ICD-10-CM | POA: Diagnosis not present

## 2015-05-24 ENCOUNTER — Encounter: Payer: Self-pay | Admitting: Pulmonary Disease

## 2015-05-24 ENCOUNTER — Ambulatory Visit (INDEPENDENT_AMBULATORY_CARE_PROVIDER_SITE_OTHER): Payer: PPO | Admitting: Pulmonary Disease

## 2015-05-24 VITALS — BP 114/80 | HR 94 | Temp 98.7°F | Ht 67.0 in | Wt 267.4 lb

## 2015-05-24 DIAGNOSIS — J449 Chronic obstructive pulmonary disease, unspecified: Secondary | ICD-10-CM

## 2015-05-24 DIAGNOSIS — J849 Interstitial pulmonary disease, unspecified: Secondary | ICD-10-CM | POA: Diagnosis not present

## 2015-05-24 DIAGNOSIS — M0579 Rheumatoid arthritis with rheumatoid factor of multiple sites without organ or systems involvement: Secondary | ICD-10-CM

## 2015-05-24 NOTE — Patient Instructions (Signed)
Today we updated your med list in our EPIC system...    Continue your current medications the same...  We will send a request to your home care company for a portable oxygen concentrator (Inogen) that will incease your mobility & be light weight 7 easier to carry due to your RA...  Call for any questions or if we can be of service in any way...  Let's plan a follow up visit in 67mo, at which time we will be repeating your CXR & follow up CT scan.Marland KitchenMarland Kitchen

## 2015-05-24 NOTE — Progress Notes (Signed)
Subjective:     Patient ID: [name], female   DOB: 1940-04-22, 75 y.o.   MRN: [id number]  HPI  ~  March 29, 2011:  Initial pulmonary consult w/ KC>        The patient is a 75year-old female who I've been asked to see for ongoing pulmonary issues. She has been given the diagnosis of COPD years ago, but also tells me that she was diagnosed with asthma in her late 76s and put on a nebulizer. The patient thinks that her breathing is worsening, and she has had 3-4 episodes this year that have been labeled as "acute exacerbation" her last flareup was the end of November, and she was treated with antibiotics and prednisone. The patient notes a one block dyspnea on exertion at a moderate pace on flat ground. She will also get winded bringing groceries in from the car, and doing any kind of light housework. She notes very significant reflux symptoms, despite being on a proton pump inhibitor. She notes a barking cough at night and also first thing in the morning upon arising. The patient currently is on Symbicort, and has taken this compliantly. She has had no recent chest x-ray or full pulmonary function studies. Of note, her weight is neutral over the last one year.       PLAN>> I suspect, with the patient's long history of tobacco abuse, that she probably does have some degree of chronic airflow obstruction. It is unclear how much asthma may be playing a role here. She has had multiple episodes this year that required antibiotics and prednisone, and I wonder how much of this is related to her underlying lung issues or possibly laryngopharyngeal reflux. She is having persistent reflux symptoms despite taking a proton pump inhibitor. She also describes cyclical coughing as well, which is typically an upper airway issue. I would like to check a chest x-ray today, and schedule her for full pulmonary function studies. I would also like to treat her reflux more aggressively over the next 3-4 weeks.  We'll also add Spiriva to her Symbicort to see if this helps  CXR 03/29/11 showed borderline heart size, ectasia of thoracic Ao, low flat diaphragms, essentially clear lungs, osteopenia & degen spondylosis in spine...  FullPFTs 05/17/11 showed FVC=3.11 (99%), FEV1=1.67 (74%), %1sec=54, mid-flows reduced at 18% predicted; no improvement in FEV1 post bronchodil; TLC=4.52 (84%), RV=1.41 (65%), RV/TLC=31%; DLCO=50% predicted.=> c/w mod airflow obstruction, borderline lung volumes, moderate decr in diffusion capacity...   ~  May 17, 2011:  ROV w/ KC>        Patient comes in today for followup after her pulmonary function studies. She also had a cough at the last visit that I felt was more upper airway in origin. She was started on a more aggressive regimen for reflux, but has not seen a big difference. Now, she is having a lot of sinus congestion and pressure, and tells me that she recently was started on CPAP. Her heater on the humidifier is set fairly low based on her just her a period she was also started on Spiriva at the last visit, and has seen improvement in her breathing since that time.      PLAN>>  The patient's cough seems more upper airway in origin, and I had tried her on a more intensified regimen for reflux disease. She saw some improvement, but not a lot. She is having a lot of nasal congestion and pressure, but has recently been started on  CPAP and has her heated humidity at a low level. I have asked her to turn the heat up on the humidifier, and have recommended sinus rinses a.m. and p.m. Until she improves. If she continues to have issues, it is possible that she may have a sinus infection. I would like to treat for reflux more aggressively for another 4 weeks, but if she does not see a big change would go back to her prior dose of proton pump inhibitor.      The patient has moderate airflow obstruction by pulmonary function studies today, and based on history, most consistent with  a combination of emphysema and asthma. She has seen considerable improvement with the addition of Spiriva, and therefore I will leave her on this along with her Symbicort. I stressed to her the importance of weight loss and conditioning, and recommended referral to pulmonary rehabilitation.  ~  November 15, 2011:  69mo ROV w/ KC>        Patient comes in today for followup of her known COPD. She has seen a significant improvement in her breathing with the addition of Spiriva. She has not had any acute exacerbation since last visit, nor has she required her rescue inhaler. Her cough is also much improved with b.i.d. Proton pump inhibitor, and also making adjustments on her CPAP humidifier.      PLAN>>  Much improved with increased dose of PPI, and also with adjustment of heater on her cpap machine.  The patient is doing well on Symbicort and Spiriva, and has not had any flareups since the last visit. I have stressed to her the importance of weight loss and some type of exercise program in order to improve her exertional tolerance. She also feels that her cough is much improved since being on b.i.d. Proton pump inhibitor, and we'll continue this. If she is doing well, she is to follow up in 6 months (Pt cancelled f/u appt 05/2012 & never rescheduled).  CT scans done by DrStoneking>   CT Chest 07/12/13 showed norm heart size, atherosclerotic changes in Ao & coronaries, no adenopathy; biapical pleuroparenchymal scarring & emphysema, scattered subpleural nodules- 90mm LUL, 53mm RUL, 106mm RML, 27mm RLL...   CT Chest 02/02/14 showed norm heart size, calcif atherosclerotic Ao & coronaries, no adenopathy, centrilob & paraseptal emphysema noted, mult small nonspecific pulm nodules- 66mm in LUL, 79mm in lingula, 29mm in RUL, 62mm in RML and no new or enlarging pulm lesions (stable pulm nodules)...  ~  February 06, 2015:  Initial pulm eval by SN>  Pt referred by DrStoneking for progressive dyspnea and abnormal CT Chest...  The patient's granddaughter is DrStoneking's nurse.      Pt presents very concerned about her pulmonary situation having recently had a 3rd CT Chest & was told that they detected more & bigger pulmonary nodules & she is scared that she might have cancer; she brought a disc w/ CT Chest done 01/26/15 at [facility] & on my review the scan looks different than prev- now w/ widespread patchy ground glass opac superimposed on her prev emphysema changes & mult small pulm nodules (now largely obscured by this interstitial process); I am concerned this new ILD may represent COP (cryptogenic organizing pneumonia- the new terminology for our prev BOOP diagnosis)... She is c/o cough, small amt beige sput w/o hemoptysis, notes SOB/DOE w/ exertion (she used to swim a lot but recently has been more sedentary- esp since knee surg 10/2014); notes SOB w/ housework x29mo & stress  makes the SOB worse, been using her Symbicort & Spiriva just "as needed" she says; DrStoneking got her a nebulizer w/ Xopenex to use TID but she has run out of this med...       In the course of our conversations Zoriyah tells me that she was prev diagnosed w/ Lupus by DrGates and DrLevitin years ago (1997) but we do not have any old data from this period (note from [place] 02/01/15 just mentioned lung nodules and PMHx has no mention of SLE, prev abn blood tests, prev Rheum eval, etc);  The pt volunteers that she had a pos ANA & was treated by DrLevitin w/ Pred intermittently over a 3 yr period;  ?why she stopped going & it was never mentioned again... She does admit to hx arthritis, hurting all over (esp knees), and she had arthroscopic knee surg by [facility] 10/26/14 "he scraped it out"; she had post op therapy  But she noted her breathing was worse...   Smoking Hx>  she is an ex-smoker, starting at age 20 & smoked for about 50 yrs up to 1.5ppd, & quit in 2003; est ~50-60 pack-yr smoking hx...  Pulmonary Hx>  She reports hx diphtheria as child, had  lots of recurrent bronchitic infections in her adult life, she reports several bouts of pneumonia & wonders if these infections left her w/ scar tissue, seen by DrClance 2013 w/ remote hx asthma/ now modCOPD treated w/ Symbicort, Spriva, PPI & antireflux regimen;  ?she had sleep eval 2013 & was started on CPAP (?[facility], no records avail)...  Medical Hx>  DrStoneking- HL, Obesity, GERD, Divertics, colon polyps, liver AVM, osteopenia, migraines, RLS, anxiety/depression...  Family Hx>  Hx asthma in her mother, otherw no FamHx lung problems...   Occup Hx>  No known occupational exposures- asbestos, chemical toxins, no recent travel or birds...  Current Meds>  Symbicort160, Spiriva, Xopenex neb, Mucinex, Claritin10, ASA81, Lisinopril5, Fish Oil, Prilosec20-2Bid, Celexa40, calcium/ vitamins... EXAM - Afeb, VSS, O2sat=92% on RA;  Obese at 251#, 5'7"Tall, BMI=39;  HEENT- neg, mallampati2;  Chest- decr BS bilat, few basilar rales, no wheezing or consolidation;  Heart- RR gr1-2 SEM w/o r/g;  Abd- obese soft neg;  Ext- w/o c/c/e  CT Chest 01/26/15 by [facility]- report by Dr. Anabel Halon- indicates extensive bilat pulm fibrosis, mult pulm masses & nodules- neoplasia cannot be excluded, mult nodes in mediastinum- mostly wnl in size... To my review this shows widespread patchy ground glass opac superimposed on her prev emphysema changes & mult small pulm nodules (now largely obscured by this interstitial process); I am concerned this new ILD may represent COP.   CXR 02/06/15 showed norm heart size, lungs appear hyperinflated w/ coarsened interstitial markings bilat & an opacity at left lung base (all this new compared to last CXR 2012)...  Spirometry 02/06/15 showed FVC=1.97 (62%), FEV1=1.40 (60%), %1sec=71%, mid-flows are reduced at 49% predicted;  This is suggestive of mixed mild obstructive & mod restrictive dis...  Ambulatory oxygen saturation test 01/2015> O2sat=92% on RA at rest w/  pulse=95/min;  She ambulated in office only 1 lap w/ lowest O2sat=88% w/ pulse=124/min...  LABS 01/2015>  Chems- wnl;  CBC- wnl x WBC=19.5;  TSH=6.41;  Collagen-vasc screen:  ACE=10 (nl<52), RheumFactor=100 (Hi pos >42), Anti-CCP=213 (strong pos >59), ANA pos 1:320 homogeneous pattern, ANCA screen= NEG (MPO & PR-3), Sed=54, CRP=4.5.Marland KitchenMarland Kitchen IMP/PLAN >>       New ILD per CXR & CT Chest- c/w COP/BOOP>  The totality of the eval  including labs looks like this could be Rheumatoid lung dis; we will refer her to [facility] ASAP & start Pred rx...      Underlying COPD/emphysema, former smoker>  She is on Symbicort160-2spBid, Spiriva daily, Xopenex nebs Tid, Mucinex and rec to take meds regularly, NOT prn...      Hx mult small pulmonary nodules seen on prev CT scans in 2013>  This will need to be followed w/ serial CXR/ CT scans...      OSA- eval & Rx per [facility], no data avail in Epic>  Per DrStoneking & Maxwell Caul...      GERD/ prob LPR>  She needs a vigorous antireflux regimen w/ PPI Bid, NPO after dinner, elev HOB 6"; DrMJohnson is her GI...      Abn collagen-vasc screen w/ +RA, +Anti-CCP, +ANA, Sed54>  She was prev treated by DrLevitin for Lupus 1997-2000, labs look like RA & we will refer pt to [facility] for Rheum eval at this time...  ~  March 14, 2015:  5mo ROV w/ SN>        At the time of our initial visit we started PRED40 x2wks=> 20 x2wks w/ f/u appt 53mo;  She saw [facility] for Rheum in the interval but we don't have note from her to review;  Pt tells me that she has Hx OA w/ prev knee arthroscopy by DrCaffrey & presist w/ lots of knee pain, but she claims that while she was taking the Pred20 she suddenly developed severe pain & swelling in her hands, wrists and ankles=> called DrHawkes who wanted to get her off the Pred & weaned her down further to 10mg /d- recall that we started the Pred for prob Rheumatoid Lung w/ CT scan suggesting COP/BOOP pattern;  She further indicates to me that she fell out of  bed 2wks ago- bruised leg & arm notes it's hard to walk w/ pain in feet & legs;  She called DStoneking's office & was switched from Aleve to Tylenol;  It got so bad she went to the ER yest=> given Tramadol &told to f/u here;  She has a follow up appt w/ [facility] tomorrow 11/30...      From the pulmonary standpoint she is about the same, but the pain in her hands/ wrist has been her main complaint for the last week or so;  She remains on Symbicort160-2spBid, Spiriva daily, NEBS w/ Xopenex tid, Mucinex prn;  She has had trouble doing the inhalers since her hands &wrist swelled up, pain w/ actuation;  She is SOB & O2sat=90% on RA today in office;  Notes sl dry cough, no sput, no hemoptysis, no CP...       EXAM shows Afeb, VSS, O2sat=90% on RA;  HEENT- neg, mallampati2;  Chest- decr BS bilat, few basilar rales, no wheezing or consolidation;  Heart- RR gr1-2 SEM w/o r/g;  Abd- obese soft neg;  Ext- swollen/ tender wrists & hands!  CXR 03/13/15 in ER> mild cardiomeg, COPD/ hyperinflation, prominent lung markings bilat, no acute changes...   EKG 03/13/15 in ER> sinus tachy, rate 106/min, NSSTTWA, NAD...  Ambulatory O2sat test 03/14/15> O2sat=91% on RA at rest w/ pulse=86/min; on standing w/ min walk O2sat dropped to 85% w/ pulse=106/min... 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      Underlying COPD/emphysema, former smoker>  She is on Symbicort160-2spBid, Spiriva daily, Xopenex nebs Tid, Mucinex and rec to take meds regularly everyday...      Hx mult small pulmonary nodules seen on prev CT scans in 2015> recent CT 01/2015 showed these nodules were masked by the ground glass opac & interstitial process; this will need to be followed w/  serial CXR/ CT scans...      OSA- eval & Rx per [facility], no data avail in Epic>  Per DrStoneking & Maxwell Caul...      GERD/ prob LPR>  She needs a vigorous antireflux regimen w/ PPI Bid, NPO after dinner, elev HOB 6"; DrMJohnson is her GI...      Abn collagen-vasc screen w/ +RA, +Anti-CCP, +ANA, Sed54> Looks like RA-  She was prev treated by DrLevitin for Lupus 1997-2000;  She was referred to [facility] for Rheum & her note is pending... PLAN >>       She is asked to keep the Pred at 10mg  per day for now due to her pulmonary process;  She has ROV tomorrow w/ Rheum for follow up & to eval her acute hand, wrist, foot arthritic process;  We are starting HOME O2 due to her acute hypoxemic resp failure... We will follow up in 7month, sooner if needed. ADDENDUM>> Pt seen by [facility] 11/30-- seropos RA, OA of both knees, ILD;  Insurance won't cover TNFs therefore started on ARAVA20 and Pred increased to 20mg /d... ADDENDUM>> Full PFTs 03/30/15:  FVC=2.23 (73%), FEV1=1.50 (65%), %1sec=68, mid-flows reduced at 51% predicted; post bronchodil the FEV1 improved 6%; TLC=4.13 (77%), RV=1.80 (75%), RV/TLC=44; DLCO=50% predicted;  These PFTs are compatible w/ mild restriction and a superimposed mild obstructive defect w/ a mod-severe reduction is DLCO...  ~  May 24, 2015:  23mo ROV w/ SN>  [name] notes that he breathing is improved w/ her home O2 therapy but she would like an Inogen One POC & we will request this from her DME;  She notes SOB/DOE stable- no progression, and mild dry cough w/o sput, no CP etc... She has remained on Pred per [facility], no recent notes scanned into Epic; on Pred20/d + Arava and she is checked Q75mo w/ slow Pred taper...      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      Underlying COPD/emphysema, former smoker>  on Symbicort160-2spBid, Spiriva daily, Xopenex nebs Tid,  Mucinex and rec to take meds regularly everyday...      Hypoxemic resp failure>  On Home O2 at 2L/min w/ O2sat=97% today in office; she is requesting a smaller POC- wants the Inogen One...      Hx mult small pulmonary nodules seen on prev CT scans in 2015> recent CT 01/2015 at [place] ([facility]) showed these nodules were masked by the ground glass opac & interstitial process; CXR 11/16 w/ incr interstitial markings.      OSA- eval & Rx per [facility], no data avail in Epic>  per DrStoneking & Maxwell Caul...      GERD/ prob LPR>  She needs a vigorous antireflux regimen w/ PPI (Prilosec40) Bid, NPO after dinner, elev HOB 6"; DrMJohnson is her GI...      Abn collagen-vasc screen w/ +RA, +Anti-CCP, +ANA, Sed54> Looks like  RA-  She was prev treated by DrLevitin for Lupus 1997-2000;  She saw [facility] 02/2015 & started on ARAVA while slowly weaning Pred. EXAM shows Afeb, VSS, O2sat=97% on 2L/min pulse dose;  Wt=267#, 5'7"Tall, BMI=40;  HEENT- neg, mallampati2;  Chest- decr BS bilat, few basilar rales, no wheezing or consolidation;  Heart- RR gr1-2 SEM w/o r/g;  Abd- obese soft neg;  Ext- swollen/ sl tender wrists & hands! IMP/PLAN>>  She is slowly weaning the Pred per Rheum & taking the Arava20;  We will request a POC device for her;  We plan ROV in 74mo w/ f/u CXR/ CT Chest...    Past Medical History  Diagnosis Date  . Diverticulosis   . GERD (gastroesophageal reflux disease)   . Colon polyps   . Obesity   . Migraine headache   . Hypercholesterolemia   . Asthma   . Asthmatic bronchitis   . H/O: GI bleed   . RLS (restless legs syndrome)   . Allergic rhinitis   . COPD (chronic obstructive pulmonary disease) [facility])     Past Surgical History  Procedure Laterality Date  . Total abdominal hysterectomy  02/17/1989  . Tonsillectomy  x2    Outpatient Encounter Prescriptions as of 05/24/2015  Medication Sig  . acetaminophen (TYLENOL) 500 MG tablet Take 1,000 mg by mouth every 6 (six) hours as  needed for moderate pain.  . Ascorbic Acid (VITAMIN C) 1000 MG tablet Take 1,000 mg by mouth daily.  Marland Kitchen aspirin 81 MG tablet Take 81 mg by mouth every other day.  . budesonide-formoterol (SYMBICORT) 160-4.5 MCG/ACT inhaler Inhale 2 puffs into the lungs 2 (two) times daily.   . Calcium Carbonate-Vitamin D (CALCIUM + D) 600-200 MG-UNIT TABS Take 1 tablet by mouth daily.    . citalopram (CELEXA) 40 MG tablet Take 40 mg by mouth daily.  Marland Kitchen guaiFENesin (MUCINEX) 600 MG 12 hr tablet Take 600 mg by mouth daily.   . Leflunomide (ARAVA PO) Take by mouth daily.  Marland Kitchen levalbuterol (XOPENEX) 1.25 MG/3ML nebulizer solution Take 1.25 mg by nebulization 3 (three) times daily.  Marland Kitchen lisinopril (PRINIVIL,ZESTRIL) 5 MG tablet Take 5 mg by mouth daily.   Marland Kitchen loratadine (CLARITIN) 10 MG tablet Take 10 mg by mouth daily.    . Omega-3 Fatty Acids (FISH OIL) 1200 MG CAPS Take 1 capsule by mouth daily.    Marland Kitchen omeprazole (PRILOSEC) 20 MG capsule Take 2 capsules by mouth twice daily (Patient taking differently: Take 20 mg by mouth daily. )  . predniSONE (DELTASONE) 10 MG tablet Take 17.5 mg by mouth daily with breakfast.                      (Per Dr. Trudie Reed)     . tiotropium (SPIRIVA) 18 MCG inhalation capsule Place 1 capsule (18 mcg total) into inhaler and inhale daily.  . traMADol (ULTRAM) 50 MG tablet Take 1 tablet (50 mg total) by mouth every 6 (six) hours as needed.  . valACYclovir (VALTREX) 1000 MG tablet Take 1,000 mg by mouth daily as needed (for breakouts).     Allergies  Allergen Reactions  . Macrolides And Ketolides   . Penicillins     Facial numbness  . Sertraline Hcl   . Shellfish Allergy   . Sulfa Antibiotics     Immunization History  Administered Date(s) Administered  . Influenza-Unspecified 12/30/2014  . Pneumococcal Conjugate-13 07/07/2013  . Pneumococcal Polysaccharide-23 03/03/2009    Current Medications, Allergies, Past Medical History, Past Surgical  History, Family History, and Social History  were reviewed in [employer] record.   Review of Systems             All symptoms NEG except where BOLDED >>  Constitutional:  F/C/S, fatigue, anorexia, unexpected weight change. HEENT:  HA, visual changes, hearing loss, earache, nasal symptoms, sore throat, mouth sores, hoarseness. Resp:  cough, sputum, hemoptysis; SOB, tightness, wheezing. Cardio:  CP, palpit, DOE, orthopnea, edema. GI:  N/V/D/C, blood in stool; reflux, abd pain, distention, gas. GU:  dysuria, freq, urgency, hematuria, flank pain, voiding difficulty. MS:  joint pain, swelling, tenderness, decr ROM; neck pain, back pain, etc. Neuro:  HA, tremors, seizures, dizziness, syncope, weakness, numbness, gait abn. Skin:  suspicious lesions or skin rash. Heme:  adenopathy, bruising, bleeding. Psyche:  confusion, agitation, sleep disturbance, hallucinations, anxiety, depression suicidal.   Objective:   Physical Exam       Vital Signs:  Reviewed...  General:  WD, obese, 75 y/o WF in NAD; alert & oriented; pleasant & cooperative... HEENT:  [place]/AT; Conjunctiva- pink, Sclera- nonicteric, EOM-wnl, PERRLA, EACs-clear, TMs-wnl; NOSE-clear; THROAT-clear & wnl. Neck:  Supple w/ fair ROM; no JVD; normal carotid impulses w/o bruits; no thyromegaly or nodules palpated; no lymphadenopathy. Chest:  Decr BS bilat, few basilar rales, no wheezing rhonchi or signs of consolidation... Heart:  Regular Rhythm; norm S1 & S2 gr1-2/6 SEM, w/o rubs or gallops detected. Abdomen:  Soft & nontender- no guarding or rebound; normal bowel sounds; no organomegaly or masses palpated. Ext:  decrROM; without deformities +arthritic changes; no varicose veins, +venous insuffic, tr edema;  Pulses intact w/o bruits. Neuro:  No focal neuro deficits; sensory testing normal; gait OK & balance OK. Derm:  No lesions noted; no rash etc. Lymph:  No cervical, supraclavicular, axillary, or inguinal adenopathy palpated.   Assessment:      IMP >>       New ILD per CXR & CT Chest- c/w COP/BOOP, prob related to Reumatoid Lung>  We started Pred 40=>20 but this was further cut to 10mg /d by [facility] in the interval; pulm status is clearly no better, ?sl worse, and she needs HOME O2 to be started rec 1-2L/min rest & 2L/min exercise => hypoxemic resp failure...      Underlying COPD/emphysema, former smoker>  She is on Symbicort160-2spBid, Spiriva daily, Xopenex nebs Tid, Mucinex and rec to take meds regularly everyday...      Hx mult small pulmonary nodules seen on prev CT scans in 2015> recent CT 01/2015 showed these nodules were masked by the ground glass opac & interstitial process; this will need to be followed w/ serial CXR/ CT scans...      OSA- eval & Rx per [facility], no data avail in Epic>  Per DrStoneking & Maxwell Caul...      GERD/ prob LPR>  She needs a vigorous antireflux regimen w/ PPI Bid, NPO after dinner, elev HOB 6"; DrMJohnson is her GI...      Abn collagen-vasc screen w/ +RA, +Anti-CCP, +ANA, Sed54> Looks like RA-  She was prev treated by DrLevitin for Lupus 1997-2000;  She was referred to [facility] for Rheum & her note is pending...  PLAN >>  02/10/15:  Start Pred 20mg  tabs- 40mg  Qam for 2 wks, then 20mg /d til return;  Refer to [place], ASAP;  Continue other meds reglarly (Symbicort, Spiriva, Xopenex, Mucinex);  Needs vigorous antireflux regimen... 11/29:  She is asked to keep the Pred at 10mg  per day for now due to her  pulmonary process;  She has ROV tomorrow w/ Rheum for follow up & to eval her acute hand, wrist, foot arthritic process;  We are starting HOME O2 due to her acute hypoxemic resp failure...  05/24/15:   She has established w/ DrHawkes for Rheum on slow Pred taper + Arava;  Breathing is stable on O2, Pred, XopenexTid, Symbicort160, Spiriva, and Mucinex...     Plan:                                      HOME O2 at 1-2L/min at rest and 2L/min w/ activity... Patient's Medications  New Prescriptions   No  medications on file  Previous Medications   ACETAMINOPHEN (TYLENOL) 500 MG TABLET    Take 1,000 mg by mouth every 6 (six) hours as needed for moderate pain.   ASCORBIC ACID (VITAMIN C) 1000 MG TABLET    Take 1,000 mg by mouth daily.   ASPIRIN 81 MG TABLET    Take 81 mg by mouth every other day.   BUDESONIDE-FORMOTEROL (SYMBICORT) 160-4.5 MCG/ACT INHALER    Inhale 2 puffs into the lungs 2 (two) times daily.    CALCIUM CARBONATE-VITAMIN D (CALCIUM + D) 600-200 MG-UNIT TABS    Take 1 tablet by mouth daily.     CITALOPRAM (CELEXA) 40 MG TABLET    Take 40 mg by mouth daily.   GUAIFENESIN (MUCINEX) 600 MG 12 HR TABLET    Take 600 mg by mouth daily.    LEFLUNOMIDE (ARAVA PO)    Take by mouth daily.   LEVALBUTEROL (XOPENEX) 1.25 MG/3ML NEBULIZER SOLUTION    Take 1.25 mg by nebulization 3 (three) times daily.   LISINOPRIL (PRINIVIL,ZESTRIL) 5 MG TABLET    Take 5 mg by mouth daily.    LORATADINE (CLARITIN) 10 MG TABLET    Take 10 mg by mouth daily.     OMEGA-3 FATTY ACIDS (FISH OIL) 1200 MG CAPS    Take 1 capsule by mouth daily.     OMEPRAZOLE (PRILOSEC) 20 MG CAPSULE    Take 2 capsules by mouth twice daily   PREDNISONE (DELTASONE) 10 MG TABLET    Take 17.5 mg by mouth daily with breakfast.   TIOTROPIUM (SPIRIVA) 18 MCG INHALATION CAPSULE    Place 1 capsule (18 mcg total) into inhaler and inhale daily.   TRAMADOL (ULTRAM) 50 MG TABLET    Take 1 tablet (50 mg total) by mouth every 6 (six) hours as needed.   VALACYCLOVIR (VALTREX) 1000 MG TABLET    Take 1,000 mg by mouth daily as needed (for breakouts).   Modified Medications   No medications on file  Discontinued Medications   PREDNISONE (STERAPRED UNI-PAK 21 TAB) 10 MG (21) TBPK TABLET    Take 10 mg by mouth daily.

## 2015-05-25 ENCOUNTER — Telehealth: Payer: Self-pay | Admitting: Pulmonary Disease

## 2015-05-25 NOTE — Telephone Encounter (Signed)
Spoke with pt. States that she was contacted by [facility] about her portable oxygen. [facility] is going to supply her with a tank that is 6lbs. Pt wants something smaller that. Advised her that in the order that was placed, we put in the order to get the pt an [place] system. Pt has an appointment with [facility] on 06/06/15 to get set up, she will discuss with them then. Nothing further was needed.

## 2015-06-13 DIAGNOSIS — M05742 Rheumatoid arthritis with rheumatoid factor of left hand without organ or systems involvement: Secondary | ICD-10-CM | POA: Diagnosis not present

## 2015-06-13 DIAGNOSIS — J439 Emphysema, unspecified: Secondary | ICD-10-CM | POA: Diagnosis not present

## 2015-06-13 DIAGNOSIS — G4733 Obstructive sleep apnea (adult) (pediatric): Secondary | ICD-10-CM | POA: Diagnosis not present

## 2015-06-13 DIAGNOSIS — J849 Interstitial pulmonary disease, unspecified: Secondary | ICD-10-CM | POA: Diagnosis not present

## 2015-06-13 DIAGNOSIS — M05741 Rheumatoid arthritis with rheumatoid factor of right hand without organ or systems involvement: Secondary | ICD-10-CM | POA: Diagnosis not present

## 2015-06-13 DIAGNOSIS — J449 Chronic obstructive pulmonary disease, unspecified: Secondary | ICD-10-CM | POA: Diagnosis not present

## 2015-06-13 DIAGNOSIS — R0600 Dyspnea, unspecified: Secondary | ICD-10-CM | POA: Diagnosis not present

## 2015-06-16 DIAGNOSIS — J849 Interstitial pulmonary disease, unspecified: Secondary | ICD-10-CM | POA: Diagnosis not present

## 2015-06-16 DIAGNOSIS — M2550 Pain in unspecified joint: Secondary | ICD-10-CM | POA: Diagnosis not present

## 2015-06-16 DIAGNOSIS — Z79899 Other long term (current) drug therapy: Secondary | ICD-10-CM | POA: Diagnosis not present

## 2015-06-16 DIAGNOSIS — M170 Bilateral primary osteoarthritis of knee: Secondary | ICD-10-CM | POA: Diagnosis not present

## 2015-06-16 DIAGNOSIS — M0579 Rheumatoid arthritis with rheumatoid factor of multiple sites without organ or systems involvement: Secondary | ICD-10-CM | POA: Diagnosis not present

## 2015-06-29 ENCOUNTER — Telehealth: Payer: Self-pay | Admitting: Pulmonary Disease

## 2015-06-29 DIAGNOSIS — J449 Chronic obstructive pulmonary disease, unspecified: Secondary | ICD-10-CM

## 2015-06-29 DIAGNOSIS — J849 Interstitial pulmonary disease, unspecified: Secondary | ICD-10-CM

## 2015-06-29 NOTE — Telephone Encounter (Signed)
Called spoke with pt. She needs order sent to [facility] for simply go POC. I have placed order. Nothing further needed

## 2015-07-13 DIAGNOSIS — J439 Emphysema, unspecified: Secondary | ICD-10-CM | POA: Diagnosis not present

## 2015-07-13 DIAGNOSIS — M05742 Rheumatoid arthritis with rheumatoid factor of left hand without organ or systems involvement: Secondary | ICD-10-CM | POA: Diagnosis not present

## 2015-07-13 DIAGNOSIS — G4733 Obstructive sleep apnea (adult) (pediatric): Secondary | ICD-10-CM | POA: Diagnosis not present

## 2015-07-13 DIAGNOSIS — M05741 Rheumatoid arthritis with rheumatoid factor of right hand without organ or systems involvement: Secondary | ICD-10-CM | POA: Diagnosis not present

## 2015-07-13 DIAGNOSIS — J849 Interstitial pulmonary disease, unspecified: Secondary | ICD-10-CM | POA: Diagnosis not present

## 2015-07-13 DIAGNOSIS — R0600 Dyspnea, unspecified: Secondary | ICD-10-CM | POA: Diagnosis not present

## 2015-07-13 DIAGNOSIS — J449 Chronic obstructive pulmonary disease, unspecified: Secondary | ICD-10-CM | POA: Diagnosis not present

## 2015-07-17 DIAGNOSIS — M0579 Rheumatoid arthritis with rheumatoid factor of multiple sites without organ or systems involvement: Secondary | ICD-10-CM | POA: Diagnosis not present

## 2015-07-18 DIAGNOSIS — M810 Age-related osteoporosis without current pathological fracture: Secondary | ICD-10-CM | POA: Diagnosis not present

## 2015-07-18 DIAGNOSIS — Z1389 Encounter for screening for other disorder: Secondary | ICD-10-CM | POA: Diagnosis not present

## 2015-07-18 DIAGNOSIS — J449 Chronic obstructive pulmonary disease, unspecified: Secondary | ICD-10-CM | POA: Diagnosis not present

## 2015-07-18 DIAGNOSIS — M05741 Rheumatoid arthritis with rheumatoid factor of right hand without organ or systems involvement: Secondary | ICD-10-CM | POA: Diagnosis not present

## 2015-07-18 DIAGNOSIS — Z0000 Encounter for general adult medical examination without abnormal findings: Secondary | ICD-10-CM | POA: Diagnosis not present

## 2015-07-18 DIAGNOSIS — E7800 Pure hypercholesterolemia, unspecified: Secondary | ICD-10-CM | POA: Diagnosis not present

## 2015-07-18 DIAGNOSIS — M05742 Rheumatoid arthritis with rheumatoid factor of left hand without organ or systems involvement: Secondary | ICD-10-CM | POA: Diagnosis not present

## 2015-07-24 ENCOUNTER — Ambulatory Visit (INDEPENDENT_AMBULATORY_CARE_PROVIDER_SITE_OTHER): Payer: PPO | Admitting: Pulmonary Disease

## 2015-07-24 ENCOUNTER — Encounter: Payer: Self-pay | Admitting: Pulmonary Disease

## 2015-07-24 VITALS — BP 118/72 | HR 88 | Temp 98.3°F | Ht 67.0 in | Wt 271.0 lb

## 2015-07-24 DIAGNOSIS — J849 Interstitial pulmonary disease, unspecified: Secondary | ICD-10-CM | POA: Diagnosis not present

## 2015-07-24 DIAGNOSIS — J449 Chronic obstructive pulmonary disease, unspecified: Secondary | ICD-10-CM

## 2015-07-24 DIAGNOSIS — M0579 Rheumatoid arthritis with rheumatoid factor of multiple sites without organ or systems involvement: Secondary | ICD-10-CM | POA: Diagnosis not present

## 2015-07-24 NOTE — Progress Notes (Addendum)
Subjective:     Patient ID: [name], female   DOB: 1940/07/07, 75 y.o.   MRN: [id number]  HPI  ~  March 29, 2011:  Initial pulmonary consult w/ KC>        The patient is a 75year-old female who I've been asked to see for ongoing pulmonary issues. She has been given the diagnosis of COPD years ago, but also tells me that she was diagnosed with asthma in her late 2s and put on a nebulizer. The patient thinks that her breathing is worsening, and she has had 3-4 episodes this year that have been labeled as "acute exacerbation" her last flareup was the end of November, and she was treated with antibiotics and prednisone. The patient notes a one block dyspnea on exertion at a moderate pace on flat ground. She will also get winded bringing groceries in from the car, and doing any kind of light housework. She notes very significant reflux symptoms, despite being on a proton pump inhibitor. She notes a barking cough at night and also first thing in the morning upon arising. The patient currently is on Symbicort, and has taken this compliantly. She has had no recent chest x-ray or full pulmonary function studies. Of note, her weight is neutral over the last one year.       PLAN>> I suspect, with the patient's long history of tobacco abuse, that she probably does have some degree of chronic airflow obstruction. It is unclear how much asthma may be playing a role here. She has had multiple episodes this year that required antibiotics and prednisone, and I wonder how much of this is related to her underlying lung issues or possibly laryngopharyngeal reflux. She is having persistent reflux symptoms despite taking a proton pump inhibitor. She also describes cyclical coughing as well, which is typically an upper airway issue. I would like to check a chest x-ray today, and schedule her for full pulmonary function studies. I would also like to treat her reflux more aggressively over the next 3-4 weeks.  We'll also add Spiriva to her Symbicort to see if this helps  CXR 03/29/11 showed borderline heart size, ectasia of thoracic Ao, low flat diaphragms, essentially clear lungs, osteopenia & degen spondylosis in spine...  FullPFTs 05/17/11 showed FVC=3.11 (99%), FEV1=1.67 (74%), %1sec=54, mid-flows reduced at 18% predicted; no improvement in FEV1 post bronchodil; TLC=4.52 (84%), RV=1.41 (65%), RV/TLC=31%; DLCO=50% predicted.=> c/w mod airflow obstruction, borderline lung volumes, moderate decr in diffusion capacity...   ~  May 17, 2011:  ROV w/ KC>        Patient comes in today for followup after her pulmonary function studies. She also had a cough at the last visit that I felt was more upper airway in origin. She was started on a more aggressive regimen for reflux, but has not seen a big difference. Now, she is having a lot of sinus congestion and pressure, and tells me that she recently was started on CPAP. Her heater on the humidifier is set fairly low based on her just her a period she was also started on Spiriva at the last visit, and has seen improvement in her breathing since that time.      PLAN>>  The patient's cough seems more upper airway in origin, and I had tried her on a more intensified regimen for reflux disease. She saw some improvement, but not a lot. She is having a lot of nasal congestion and pressure, but has recently been started on  CPAP and has her heated humidity at a low level. I have asked her to turn the heat up on the humidifier, and have recommended sinus rinses a.m. and p.m. Until she improves. If she continues to have issues, it is possible that she may have a sinus infection. I would like to treat for reflux more aggressively for another 4 weeks, but if she does not see a big change would go back to her prior dose of proton pump inhibitor.      The patient has moderate airflow obstruction by pulmonary function studies today, and based on history, most consistent with  a combination of emphysema and asthma. She has seen considerable improvement with the addition of Spiriva, and therefore I will leave her on this along with her Symbicort. I stressed to her the importance of weight loss and conditioning, and recommended referral to pulmonary rehabilitation.  ~  November 15, 2011:  54mo ROV w/ KC>        Patient comes in today for followup of her known COPD. She has seen a significant improvement in her breathing with the addition of Spiriva. She has not had any acute exacerbation since last visit, nor has she required her rescue inhaler. Her cough is also much improved with b.i.d. Proton pump inhibitor, and also making adjustments on her CPAP humidifier.      PLAN>>  Much improved with increased dose of PPI, and also with adjustment of heater on her cpap machine.  The patient is doing well on Symbicort and Spiriva, and has not had any flareups since the last visit. I have stressed to her the importance of weight loss and some type of exercise program in order to improve her exertional tolerance. She also feels that her cough is much improved since being on b.i.d. Proton pump inhibitor, and we'll continue this. If she is doing well, she is to follow up in 6 months (Pt cancelled f/u appt 05/2012 & never rescheduled).  CT scans done by DrStoneking>   CT Chest 07/12/13 showed norm heart size, atherosclerotic changes in Ao & coronaries, no adenopathy; biapical pleuroparenchymal scarring & emphysema, scattered subpleural nodules- 39mm LUL, 63mm RUL, 54mm RML, 30mm RLL...   CT Chest 02/02/14 showed norm heart size, calcif atherosclerotic Ao & coronaries, no adenopathy, centrilob & paraseptal emphysema noted, mult small nonspecific pulm nodules- 4mm in LUL, 62mm in lingula, 48mm in RUL, 19mm in RML and no new or enlarging pulm lesions (stable pulm nodules)...  ~  February 06, 2015:  Initial pulm eval by SN>  Pt referred by DrStoneking for progressive dyspnea and abnormal CT Chest...  The patient's granddaughter is DrStoneking's nurse.      Pt presents very concerned about her pulmonary situation having recently had a 3rd CT Chest & was told that they detected more & bigger pulmonary nodules & she is scared that she might have cancer; she brought a disc w/ CT Chest done 01/26/15 at [facility] & on my review the scan looks different than prev- now w/ widespread patchy ground glass opac superimposed on her prev emphysema changes & mult small pulm nodules (now largely obscured by this interstitial process); I am concerned this new ILD may represent COP (cryptogenic organizing pneumonia- the new terminology for our prev BOOP diagnosis)... She is c/o cough, small amt beige sput w/o hemoptysis, notes SOB/DOE w/ exertion (she used to swim a lot but recently has been more sedentary- esp since knee surg 10/2014); notes SOB w/ housework x52mo & stress  makes the SOB worse, been using her Symbicort & Spiriva just "as needed" she says; DrStoneking got her a nebulizer w/ Xopenex to use TID but she has run out of this med...       In the course of our conversations Jadon tells me that she was prev diagnosed w/ Lupus by DrGates and DrLevitin years ago (1997) but we do not have any old data from this period (note from [place] 02/01/15 just mentioned lung nodules and PMHx has no mention of SLE, prev abn blood tests, prev Rheum eval, etc);  The pt volunteers that she had a pos ANA & was treated by DrLevitin w/ Pred intermittently over a 3 yr period;  ?why she stopped going & it was never mentioned again... She does admit to hx arthritis, hurting all over (esp knees), and she had arthroscopic knee surg by [facility] 10/26/14 "he scraped it out"; she had post op therapy  But she noted her breathing was worse...   Smoking Hx>  she is an ex-smoker, starting at age 85 & smoked for about 50 yrs up to 1.5ppd, & quit in 2003; est ~50-60 pack-yr smoking hx...  Pulmonary Hx>  She reports hx diphtheria as child, had  lots of recurrent bronchitic infections in her adult life, she reports several bouts of pneumonia & wonders if these infections left her w/ scar tissue, seen by DrClance 2013 w/ remote hx asthma/ now modCOPD treated w/ Symbicort, Spriva, PPI & antireflux regimen;  ?she had sleep eval 2013 & was started on CP[facility] (?[facility], no [facility] avail)...  Medical Hx>  DrStoneking- HL, Obesity, GERD, Divertics, colon polyps, liver AVM, osteopenia, migraines, RLS, anxiety/depression...  Family Hx>  Hx asthma in her mother, otherw no FamHx lung problems...   Occup Hx>  No known occupational exposures- asbestos, chemical toxins, no recent travel or birds...  Current Meds>  Symbicort160, Spiriva, Xopenex neb, Mucinex, Claritin10, ASA81, Lisinopril5, Fish Oil, Prilosec20-2Bid, Celexa40, calcium/ vitamins... EXAM - Afeb, VSS, O2sat=92% on RA;  Obese at 251#, 5'7"Tall, BMI=39;  HEENT- neg, mallampati2;  Chest- decr BS bilat, few basilar rales, no wheezing or consolidation;  Heart- RR gr1-2 SEM w/o r/g;  Abd- obese soft neg;  Ext- w/o c/c/e  CT Chest 01/26/15 by [facility]- report by Dr. Anabel Halon- indicates extensive bilat pulm fibrosis, mult pulm masses & nodules- neoplasia cannot be excluded, mult nodes in mediastinum- mostly wnl in size... To my review this shows widespread patchy ground glass opac superimposed on her prev emphysema changes & mult small pulm nodules (now largely obscured by this interstitial process); I am concerned this new ILD may represent COP.   CXR 02/06/15 showed norm heart size, lungs appear hyperinflated w/ coarsened interstitial markings bilat & an opacity at left lung base (all this new compared to last CXR 2012)...  Spirometry 02/06/15 showed FVC=1.97 (62%), FEV1=1.40 (60%), %1sec=71%, mid-flows are reduced at 49% predicted;  This is suggestive of mixed mild obstructive & mod restrictive dis...  Ambulatory oxygen saturation test 01/2015> O2sat=92% on RA at rest w/  pulse=95/min;  She ambulated in office only 1 lap w/ lowest O2sat=88% w/ pulse=124/min...  LABS 01/2015>  Chems- wnl;  CBC- wnl x WBC=19.5;  TSH=6.41;  Collagen-vasc screen:  ACE=10 (nl<52), RheumFactor=100 (Hi pos >42), Anti-CCP=213 (strong pos >59), ANA pos 1:320 homogeneous pattern, ANCA screen= NEG (MPO & PR-3), Sed=54, CRP=4.5.Marland KitchenMarland Kitchen IMP/PLAN >>       New ILD per CXR & CT Chest- c/w COP/BOOP>  The totality of the eval  including labs looks like this could be Rheumatoid lung dis; we will refer her to [facility] ASAP & start Pred rx...      Underlying COPD/emphysema, former smoker>  She is on Symbicort160-2spBid, Spiriva daily, Xopenex nebs Tid, Mucinex and rec to take meds regularly, NOT prn...      Hx mult small pulmonary nodules seen on prev CT scans in 2013>  This will need to be followed w/ serial CXR/ CT scans...      OSA- eval & Rx per [facility], no data avail in Epic>  Per DrStoneking & Maxwell Caul...      GERD/ prob LPR>  She needs a vigorous antireflux regimen w/ PPI Bid, NPO after dinner, elev HOB 6"; DrMJohnson is her GI...      Abn collagen-vasc screen w/ +RA, +Anti-CCP, +ANA, Sed54>  She was prev treated by DrLevitin for Lupus 1997-2000, labs look like RA & we will refer pt to [facility] for Rheum eval at this time...  ~  March 14, 2015:  6mo ROV w/ SN>        At the time of our initial visit we started PRED40 x2wks=> 20 x2wks w/ f/u appt 15mo;  She saw [facility] for Rheum in the interval but we don't have note from her to review;  Pt tells me that she has Hx OA w/ prev knee arthroscopy by DrCaffrey & presist w/ lots of knee pain, but she claims that while she was taking the Pred20 she suddenly developed severe pain & swelling in her hands, wrists and ankles=> called DrHawkes who wanted to get her off the Pred & weaned her down further to 10mg /d- recall that we started the Pred for prob Rheumatoid Lung w/ CT scan suggesting COP/BOOP pattern;  She further indicates to me that she fell out of  bed 2wks ago- bruised leg & arm notes it's hard to walk w/ pain in feet & legs;  She called DStoneking's office & was switched from Aleve to Tylenol;  It got so bad she went to the ER yest=> given Tramadol &told to f/u here;  She has a follow up appt w/ [facility] tomorrow 11/30...      From the pulmonary standpoint she is about the same, but the pain in her hands/ wrist has been her main complaint for the last week or so;  She remains on Symbicort160-2spBid, Spiriva daily, NEBS w/ Xopenex tid, Mucinex prn;  She has had trouble doing the inhalers since her hands &wrist swelled up, pain w/ actuation;  She is SOB & O2sat=90% on RA today in office;  Notes sl dry cough, no sput, no hemoptysis, no CP...       EXAM shows Afeb, VSS, O2sat=90% on RA;  HEENT- neg, mallampati2;  Chest- decr BS bilat, few basilar rales, no wheezing or consolidation;  Heart- RR gr1-2 SEM w/o r/g;  Abd- obese soft neg;  Ext- swollen/ tender wrists & hands!  CXR 03/13/15 in ER> mild cardiomeg, COPD/ hyperinflation, prominent lung markings bilat, no acute changes...   EKG 03/13/15 in ER> sinus tachy, rate 106/min, NSSTTWA, NAD...  Ambulatory O2sat test 03/14/15> O2sat=91% on RA at rest w/ pulse=86/min; on standing w/ min walk O2sat dropped to 85% w/ pulse=106/min... 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      Underlying COPD/emphysema, former smoker>  She is on Symbicort160-2spBid, Spiriva daily, Xopenex nebs Tid, Mucinex and rec to take meds regularly everyday...      Hx mult small pulmonary nodules seen on prev CT scans in 2015> recent CT 01/2015 showed these nodules were masked by the ground glass opac & interstitial process; this will need to be followed w/  serial CXR/ CT scans...      [facility]A- eval & Rx per [facility], no data avail in Epic>  Per DrStoneking & Maxwell Caul...      GERD/ prob LPR>  She needs a vigorous antireflux regimen w/ PPI Bid, NPO after dinner, elev HOB 6"; DrMJohnson is her GI...      Abn collagen-vasc screen w/ +RA, +Anti-CCP, +ANA, Sed54> Looks like RA-  She was prev treated by DrLevitin for Lupus 1997-2000;  She was referred to [facility] for Rheum & her note is pending... PLAN >>       She is asked to keep the Pred at 10mg  per day for now due to her pulmonary process;  She has ROV tomorrow w/ Rheum for follow up & to eval her acute hand, wrist, foot arthritic process;  We are starting HOME O2 due to her acute hypoxemic resp failure... We will follow up in 40month, sooner if needed. ADDENDUM>> Pt seen by [facility] 11/30-- seropos RA, OA of both knees, ILD;  Insurance won't cover TNFs therefore started on ARAVA20 and Pred increased to 20mg /d... ADDENDUM>> Full PFTs 03/30/15:  FVC=2.23 (73%), FEV1=1.50 (65%), %1sec=68, mid-flows reduced at 51% predicted; post bronchodil the FEV1 improved 6%; TLC=4.13 (77%), RV=1.80 (75%), RV/TLC=44; DLCO=50% predicted;  These PFTs are compatible w/ mild restriction and a superimposed mild obstructive defect w/ a mod-severe reduction is DLCO...  ~  May 24, 2015:  93mo ROV w/ SN>  Chareese notes that he breathing is improved w/ her home O2 therapy but she would like an Inogen One POC & we will request this from her DME;  She notes SOB/DOE stable- no progression, and mild dry cough w/o sput, no CP etc... She has remained on Pred per [facility], no recent notes scanned into Epic; on Pred20/d + Arava and she is checked Q79mo w/ slow Pred taper...      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      Underlying COPD/emphysema, former smoker>  on Symbicort160-2spBid, Spiriva daily, Xopenex nebs Tid,  Mucinex and rec to take meds regularly everyday...      Hypoxemic resp failure>  On Home O2 at 2L/min w/ O2sat=97% today in office; she is requesting a smaller POC- wants the Inogen One...      Hx mult small pulmonary nodules seen on prev CT scans in 2015> recent CT 01/2015 at [place] ([facility]) showed these nodules were masked by the ground glass opac & interstitial process; CXR 11/16 w/ incr interstitial markings.      [facility]A- eval & Rx per [facility], no data avail in Epic>  per DrStoneking & Maxwell Caul...      GERD/ prob LPR>  She needs a vigorous antireflux regimen w/ PPI (Prilosec40) Bid, NPO after dinner, elev HOB 6"; DrMJohnson is her GI...      Abn collagen-vasc screen w/ +RA, +Anti-CCP, +ANA, Sed54> Looks like  RA-  She was prev treated by DrLevitin for Lupus 1997-2000;  She saw [facility] 02/2015 & started on ARAVA while slowly weaning Pred. EXAM shows Afeb, VSS, O2sat=97% on 2L/min pulse dose;  Wt=267#, 5'7"Tall, BMI=40;  HEENT- neg, mallampati2;  Chest- decr BS bilat, few basilar rales, no wheezing or consolidation;  Heart- RR gr1-2 SEM w/o r/g;  Abd- obese soft neg;  Ext- swollen/ sl tender wrists & hands! IMP/PLAN>>  She is slowly weaning the Pred per Rheum & taking the Arava20;  We will request a POC device for her;  We plan ROV in 37mo w/ f/u CXR/ CT Chest...  ~  July 24, 2015:  86mo ROV and Mailee notes that her breathing is stable> she is an ex-smoker w/ mixed obstructive (COPD/emphysema) & restrictive (ILD w/ prob RA and obesity) lung dis, chronic hypoxemic resp failure on HomeO2;  She has been using her Symbocort160-2spBid, Spiriva daily, Mucinex600- one tab daily; she has not been using her nebulizer and notes that her breathing is good;  She is followed by DrHawkes Q8mo on Arava & slow Pred taper- currently on 10mg /d...      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      Underlying COPD/emphysema, former smoker>  on Symbicort160-2spBid, Spiriva daily, Xopenex nebs Tid (only using this prn), Mucinex and rec to take meds regularly everyday...      Hypoxemic resp failure>  On Home O2 at 2L/min w/ O2sat=97% on 2L in office today on her new "simply go" POC...       Hx mult small pulmonary nodules seen on prev CT scans in 2015> CT 01/2015 at [place] ([facility]) showed these nodules were masked by the ground glass opac & interstitial process; CXR 11/16 w/ incr interstitial markings.      [facility]A- eval & Rx per [facility], no data avail in Epic>  per DrStoneking & Maxwell Caul...      GERD/ prob LPR>  She needs a vigorous antireflux regimen w/ PPI (Prilosec40) Bid, NPO after dinner, elev HOB 6"; DrMJohnson is her GI...      Abn collagen-vasc screen w/ +RA, +Anti-CCP, +ANA, Sed54> Looks like RA-  She was prev treated by DrLevitin for Lupus 1997-2000;  She saw [facility] 02/2015 & started on ARAVA while slowly weaning Pred. EXAM shows Afeb, VSS, O2sat=97% on 2L/min pulse dose;  Wt=267#, 5'7"Tall, BMI=40;  HEENT- neg, mallampati2;  Chest- decr BS bilat, few basilar rales, no wheezing or consolidation;  Heart- RR gr1-2 SEM w/o r/g;  Abd- obese soft neg;  Ext- much improved wrists & hands  CT Chest => done 07/28/15> norm heart size, atherosclerotic changes in Ao & LAD, mod centrilobular & paraseptal emphysema bilaterally, mult pulmonary nodules are unchanged from 06/2013 scan and felt c/w benign process, NO MENTION MADE OF INTERSTITIAL PROCESS SEEN ON CT CHEST 01/26/15 from [facility] => now largely resolved...    IMP/PLAN>>  Shatara's breathing is stable, at her new baseline & we will proceed w/ new CT Chest to compare to the old; slow Pred taper per rheum and she desperately needs to get on diet & get weight down; continue O2, Symbicort, Spiriva, etc...    Past Medical History  Diagnosis Date  . Diverticulosis   . GERD (gastroesophageal reflux disease)    . Colon polyps   . Obesity   . Migraine  headache   . Hypercholesterolemia   . Asthma   . Asthmatic bronchitis   . H/O: GI bleed   . RLS (restless legs syndrome)   . Allergic rhinitis   . COPD (chronic obstructive pulmonary disease) [facility])     Past Surgical History  Procedure Laterality Date  . Total abdominal hysterectomy  02/17/1989  . Tonsillectomy  x2    Outpatient Encounter Prescriptions as of 07/24/2015  Medication Sig  . acetaminophen (TYLENOL) 500 MG tablet Take 1,000 mg by mouth every 6 (six) hours as needed for moderate pain.  . Ascorbic Acid (VITAMIN C) 1000 MG tablet Take 1,000 mg by mouth daily.  Marland Kitchen aspirin 81 MG tablet Take 81 mg by mouth every other day.  . budesonide-formoterol (SYMBICORT) 160-4.5 MCG/ACT inhaler Inhale 2 puffs into the lungs 2 (two) times daily.   . Calcium Carbonate-Vitamin D (CALCIUM + D) 600-200 MG-UNIT TABS Take 1 tablet by mouth daily.    . citalopram (CELEXA) 40 MG tablet Take 40 mg by mouth daily.  Marland Kitchen guaiFENesin (MUCINEX) 600 MG 12 hr tablet Take 600 mg by mouth daily.   . Leflunomide (ARAVA PO) Take by mouth daily.  Marland Kitchen levalbuterol (XOPENEX) 1.25 MG/3ML nebulizer solution Take 1.25 mg by nebulization 3 (three) times daily. (Patient taking differently: Take 1.25 mg by nebulization 3 (three) times daily as needed. )  . lisinopril (PRINIVIL,ZESTRIL) 5 MG tablet Take 5 mg by mouth daily.   Marland Kitchen loratadine (CLARITIN) 10 MG tablet Take 10 mg by mouth daily.    . Omega-3 Fatty Acids (FISH OIL) 1200 MG CAPS Take 1 capsule by mouth daily.    Marland Kitchen omeprazole (PRILOSEC) 40 MG capsule Take 1 capsule by mouth 2 (two) times daily.  . OXYGEN Inhale into the lungs. 2 lpm with rest and exertion  . predniSONE (DELTASONE) 10 MG tablet Take 10 mg by mouth daily with breakfast.   . tiotropium (SPIRIVA) 18 MCG inhalation capsule Place 1 capsule (18 mcg total) into inhaler and inhale daily.  . traMADol (ULTRAM) 50 MG tablet Take 1 tablet (50 mg total) by mouth every 6  (six) hours as needed.  . valACYclovir (VALTREX) 1000 MG tablet Take 1,000 mg by mouth daily as needed (for breakouts).   . [DISCONTINUED] omeprazole (PRILOSEC) 20 MG capsule Take 2 capsules by mouth twice daily (Patient taking differently: Take 20 mg by mouth daily. )   No facility-administered encounter medications on file as of 07/24/2015.    Allergies  Allergen Reactions  . Macrolides And Ketolides   . Penicillins     Facial numbness  . Sertraline Hcl   . Shellfish Allergy   . Sulfa Antibiotics     Immunization History  Administered Date(s) Administered  . Influenza-Unspecified 12/30/2014  . Pneumococcal Conjugate-13 07/07/2013  . Pneumococcal Polysaccharide-23 03/03/2009    Current Medications, Allergies, Past Medical History, Past Surgical History, Family History, and Social History were reviewed in [employer] record.   Review of Systems             All symptoms NEG except where BOLDED >>  Constitutional:  F/C/S, fatigue, anorexia, unexpected weight change. HEENT:  HA, visual changes, hearing loss, earache, nasal symptoms, sore throat, mouth sores, hoarseness. Resp:  cough, sputum, hemoptysis; SOB, tightness, wheezing. Cardio:  CP, palpit, DOE, orthopnea, edema. GI:  N/V/D/C, blood in stool; reflux, abd pain, distention, gas. GU:  dysuria, freq, urgency, hematuria, flank pain, voiding difficulty. MS:  joint pain, swelling, tenderness, decr ROM; neck pain, back  pain, etc. Neuro:  HA, tremors, seizures, dizziness, syncope, weakness, numbness, gait abn. Skin:  suspicious lesions or skin rash. Heme:  adenopathy, bruising, bleeding. Psyche:  confusion, agitation, sleep disturbance, hallucinations, anxiety, depression suicidal.   Objective:   Physical Exam       Vital Signs:  Reviewed...  General:  WD, obese, 75 y/o WF in NAD; alert & oriented; pleasant & cooperative... HEENT:  [place]/AT; Conjunctiva- pink, Sclera- nonicteric, EOM-wnl, PERRLA,  EACs-clear, TMs-wnl; NOSE-clear; THROAT-clear & wnl. Neck:  Supple w/ fair ROM; no JVD; normal carotid impulses w/o bruits; no thyromegaly or nodules palpated; no lymphadenopathy. Chest:  Decr BS bilat, few basilar rales, no wheezing rhonchi or signs of consolidation... Heart:  Regular Rhythm; norm S1 & S2 gr1-2/6 SEM, w/o rubs or gallops detected. Abdomen:  Soft & nontender- no guarding or rebound; normal bowel sounds; no organomegaly or masses palpated. Ext:  decrROM; without deformities +arthritic changes; no varicose veins, +venous insuffic, tr edema;  Pulses intact w/o bruits. Neuro:  No focal neuro deficits; sensory testing normal; gait OK & balance OK. Derm:  No lesions noted; no rash etc. Lymph:  No cervical, supraclavicular, axillary, or inguinal adenopathy palpated.   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      Underlying COPD/emphysema, former smoker>  on Symbicort160-2spBid, Spiriva daily, Xopenex nebs Tid (only using this prn), Mucinex and rec to take meds regularly everyday...      Hypoxemic resp failure>  On Home O2 at 2L/min w/ O2sat=97% on 2L in office today on her new "simply go" POC...       Hx mult small pulmonary nodules seen on prev CT scans in 2015> CT 01/2015 at [place] ([facility]) showed these nodules were masked by the ground glass opac & interstitial process; CXR 11/16 w/ incr interstitial markings.      OSA- eval & Rx per [facility], no data avail in Epic>  per DrStoneking & Maxwell Caul...      GERD/ prob LPR>  She needs a vigorous antireflux regimen w/ PPI (Prilosec40) Bid, NPO after dinner, elev HOB 6"; DrMJohnson is her GI...      Abn collagen-vasc screen w/ +RA, +Anti-CCP, +ANA, Sed54> Looks like RA-  She was prev treated by DrLevitin for Lupus 1997-2000;  She saw [facility] &  started on ARAVA while slowly weaning Pred.  PLAN >>  02/10/15:  Start Pred 20mg  tabs- 40mg  Qam for 2 wks, then 20mg /d til return;  Refer to [place], ASAP;  Continue other meds reglarly (Symbicort, Spiriva, Xopenex, Mucinex);  Needs vigorous antireflux regimen... 11/29:  She is asked to keep the Pred at 10mg  per day for now due to her pulmonary process;  She has ROV tomorrow w/ Rheum for follow up & to eval her acute hand, wrist, foot arthritic process;  We are starting HOME O2 due to her acute hypoxemic resp failure...  05/24/15:   She has established w/ DrHawkes for Rheum on slow Pred taper + Arava;  Breathing is stable on O2, Pred, XopenexTid, Symbicort160, Spiriva, and Mucinex... 4/10>   Buford's breathing is stable, at her new baseline & we will proceed w/ new CT Chest to compare to the old; slow Pred taper per rheum and she desperately needs to get on diet & get  weight down; continue O2, Symbicort, Spiriva, etc...     Plan:                                      HOME O2 at 1-2L/min at rest and 2L/min w/ activity... Patient's Medications  New Prescriptions   No medications on file  Previous Medications   ACETAMINOPHEN (TYLENOL) 500 MG TABLET    Take 1,000 mg by mouth every 6 (six) hours as needed for moderate pain.   ASCORBIC ACID (VITAMIN C) 1000 MG TABLET    Take 1,000 mg by mouth daily.   ASPIRIN 81 MG TABLET    Take 81 mg by mouth every other day.   BUDESONIDE-FORMOTEROL (SYMBICORT) 160-4.5 MCG/ACT INHALER    Inhale 2 puffs into the lungs 2 (two) times daily.    CALCIUM CARBONATE-VITAMIN D (CALCIUM + D) 600-200 MG-UNIT TABS    Take 1 tablet by mouth daily.     CITALOPRAM (CELEXA) 40 MG TABLET    Take 40 mg by mouth daily.   GUAIFENESIN (MUCINEX) 600 MG 12 HR TABLET    Take 600 mg by mouth daily.    LEFLUNOMIDE (ARAVA PO)    Take by mouth daily.   LEVALBUTEROL (XOPENEX) 1.25 MG/3ML NEBULIZER SOLUTION    Take 1.25 mg by nebulization 3 (three) times daily.   LISINOPRIL  (PRINIVIL,ZESTRIL) 5 MG TABLET    Take 5 mg by mouth daily.    LORATADINE (CLARITIN) 10 MG TABLET    Take 10 mg by mouth daily.     OMEGA-3 FATTY ACIDS (FISH OIL) 1200 MG CAPS    Take 1 capsule by mouth daily.     OMEPRAZOLE (PRILOSEC) 40 MG CAPSULE    Take 1 capsule by mouth 2 (two) times daily.   OXYGEN    Inhale into the lungs. 2 lpm with rest and exertion   PREDNISONE (DELTASONE) 10 MG TABLET    Take 10 mg by mouth daily with breakfast.    TIOTROPIUM (SPIRIVA) 18 MCG INHALATION CAPSULE    Place 1 capsule (18 mcg total) into inhaler and inhale daily.   TRAMADOL (ULTRAM) 50 MG TABLET    Take 1 tablet (50 mg total) by mouth every 6 (six) hours as needed.   VALACYCLOVIR (VALTREX) 1000 MG TABLET    Take 1,000 mg by mouth daily as needed (for breakouts).   Modified Medications   No medications on file  Discontinued Medications   OMEPRAZOLE (PRILOSEC) 20 MG CAPSULE    Take 2 capsules by mouth twice daily

## 2015-07-24 NOTE — Patient Instructions (Signed)
Today we updated your med list in our EPIC system...    Continue your current medications the same...  We will arrange for a follow up CT Chest as we discussed...    We will contact you w/ the results when available...   Call for any questions or if we can be of service in any way...  Let's plan a follow up visit in 66mo, sooner if needed for any breathing problems.Marland KitchenMarland Kitchen

## 2015-07-27 ENCOUNTER — Other Ambulatory Visit: Payer: PPO

## 2015-07-27 ENCOUNTER — Other Ambulatory Visit: Payer: Self-pay | Admitting: Pulmonary Disease

## 2015-07-27 DIAGNOSIS — R911 Solitary pulmonary nodule: Secondary | ICD-10-CM

## 2015-07-27 DIAGNOSIS — J449 Chronic obstructive pulmonary disease, unspecified: Secondary | ICD-10-CM

## 2015-07-28 ENCOUNTER — Ambulatory Visit
Admission: RE | Admit: 2015-07-28 | Discharge: 2015-07-28 | Disposition: A | Discharge status: Home / Self Care | Payer: PPO | Source: Ambulatory Visit | Attending: Pulmonary Disease | Admitting: Pulmonary Disease

## 2015-07-28 DIAGNOSIS — J449 Chronic obstructive pulmonary disease, unspecified: Secondary | ICD-10-CM

## 2015-07-28 DIAGNOSIS — R911 Solitary pulmonary nodule: Secondary | ICD-10-CM

## 2015-07-28 DIAGNOSIS — R918 Other nonspecific abnormal finding of lung field: Secondary | ICD-10-CM | POA: Diagnosis not present

## 2015-08-13 DIAGNOSIS — M05741 Rheumatoid arthritis with rheumatoid factor of right hand without organ or systems involvement: Secondary | ICD-10-CM | POA: Diagnosis not present

## 2015-08-13 DIAGNOSIS — J449 Chronic obstructive pulmonary disease, unspecified: Secondary | ICD-10-CM | POA: Diagnosis not present

## 2015-08-13 DIAGNOSIS — J439 Emphysema, unspecified: Secondary | ICD-10-CM | POA: Diagnosis not present

## 2015-08-13 DIAGNOSIS — M05742 Rheumatoid arthritis with rheumatoid factor of left hand without organ or systems involvement: Secondary | ICD-10-CM | POA: Diagnosis not present

## 2015-08-13 DIAGNOSIS — G4733 Obstructive sleep apnea (adult) (pediatric): Secondary | ICD-10-CM | POA: Diagnosis not present

## 2015-08-13 DIAGNOSIS — R0600 Dyspnea, unspecified: Secondary | ICD-10-CM | POA: Diagnosis not present

## 2015-08-13 DIAGNOSIS — J849 Interstitial pulmonary disease, unspecified: Secondary | ICD-10-CM | POA: Diagnosis not present

## 2015-08-16 DIAGNOSIS — Z79899 Other long term (current) drug therapy: Secondary | ICD-10-CM | POA: Diagnosis not present

## 2015-08-16 DIAGNOSIS — J849 Interstitial pulmonary disease, unspecified: Secondary | ICD-10-CM | POA: Diagnosis not present

## 2015-08-16 DIAGNOSIS — M2550 Pain in unspecified joint: Secondary | ICD-10-CM | POA: Diagnosis not present

## 2015-08-16 DIAGNOSIS — M0579 Rheumatoid arthritis with rheumatoid factor of multiple sites without organ or systems involvement: Secondary | ICD-10-CM | POA: Diagnosis not present

## 2015-08-16 DIAGNOSIS — M170 Bilateral primary osteoarthritis of knee: Secondary | ICD-10-CM | POA: Diagnosis not present

## 2015-08-23 DIAGNOSIS — M8589 Other specified disorders of bone density and structure, multiple sites: Secondary | ICD-10-CM | POA: Diagnosis not present

## 2015-08-23 DIAGNOSIS — M859 Disorder of bone density and structure, unspecified: Secondary | ICD-10-CM | POA: Diagnosis not present

## 2015-09-12 DIAGNOSIS — J449 Chronic obstructive pulmonary disease, unspecified: Secondary | ICD-10-CM | POA: Diagnosis not present

## 2015-09-12 DIAGNOSIS — M05741 Rheumatoid arthritis with rheumatoid factor of right hand without organ or systems involvement: Secondary | ICD-10-CM | POA: Diagnosis not present

## 2015-09-12 DIAGNOSIS — G4733 Obstructive sleep apnea (adult) (pediatric): Secondary | ICD-10-CM | POA: Diagnosis not present

## 2015-09-12 DIAGNOSIS — M05742 Rheumatoid arthritis with rheumatoid factor of left hand without organ or systems involvement: Secondary | ICD-10-CM | POA: Diagnosis not present

## 2015-09-12 DIAGNOSIS — J849 Interstitial pulmonary disease, unspecified: Secondary | ICD-10-CM | POA: Diagnosis not present

## 2015-09-12 DIAGNOSIS — J439 Emphysema, unspecified: Secondary | ICD-10-CM | POA: Diagnosis not present

## 2015-09-12 DIAGNOSIS — R0600 Dyspnea, unspecified: Secondary | ICD-10-CM | POA: Diagnosis not present

## 2015-09-25 DIAGNOSIS — M85861 Other specified disorders of bone density and structure, right lower leg: Secondary | ICD-10-CM | POA: Diagnosis not present

## 2015-09-25 DIAGNOSIS — M85862 Other specified disorders of bone density and structure, left lower leg: Secondary | ICD-10-CM | POA: Diagnosis not present

## 2015-10-13 DIAGNOSIS — J449 Chronic obstructive pulmonary disease, unspecified: Secondary | ICD-10-CM | POA: Diagnosis not present

## 2015-10-13 DIAGNOSIS — G4733 Obstructive sleep apnea (adult) (pediatric): Secondary | ICD-10-CM | POA: Diagnosis not present

## 2015-10-13 DIAGNOSIS — J849 Interstitial pulmonary disease, unspecified: Secondary | ICD-10-CM | POA: Diagnosis not present

## 2015-10-13 DIAGNOSIS — J439 Emphysema, unspecified: Secondary | ICD-10-CM | POA: Diagnosis not present

## 2015-10-13 DIAGNOSIS — M05741 Rheumatoid arthritis with rheumatoid factor of right hand without organ or systems involvement: Secondary | ICD-10-CM | POA: Diagnosis not present

## 2015-10-13 DIAGNOSIS — M05742 Rheumatoid arthritis with rheumatoid factor of left hand without organ or systems involvement: Secondary | ICD-10-CM | POA: Diagnosis not present

## 2015-10-13 DIAGNOSIS — R0600 Dyspnea, unspecified: Secondary | ICD-10-CM | POA: Diagnosis not present

## 2015-10-19 DIAGNOSIS — J849 Interstitial pulmonary disease, unspecified: Secondary | ICD-10-CM | POA: Diagnosis not present

## 2015-10-19 DIAGNOSIS — M0579 Rheumatoid arthritis with rheumatoid factor of multiple sites without organ or systems involvement: Secondary | ICD-10-CM | POA: Diagnosis not present

## 2015-10-19 DIAGNOSIS — Z79899 Other long term (current) drug therapy: Secondary | ICD-10-CM | POA: Diagnosis not present

## 2015-10-19 DIAGNOSIS — M2550 Pain in unspecified joint: Secondary | ICD-10-CM | POA: Diagnosis not present

## 2015-10-19 DIAGNOSIS — M170 Bilateral primary osteoarthritis of knee: Secondary | ICD-10-CM | POA: Diagnosis not present

## 2015-11-12 DIAGNOSIS — J439 Emphysema, unspecified: Secondary | ICD-10-CM | POA: Diagnosis not present

## 2015-11-12 DIAGNOSIS — J849 Interstitial pulmonary disease, unspecified: Secondary | ICD-10-CM | POA: Diagnosis not present

## 2015-11-12 DIAGNOSIS — J449 Chronic obstructive pulmonary disease, unspecified: Secondary | ICD-10-CM | POA: Diagnosis not present

## 2015-11-12 DIAGNOSIS — R0600 Dyspnea, unspecified: Secondary | ICD-10-CM | POA: Diagnosis not present

## 2015-11-12 DIAGNOSIS — M05742 Rheumatoid arthritis with rheumatoid factor of left hand without organ or systems involvement: Secondary | ICD-10-CM | POA: Diagnosis not present

## 2015-11-12 DIAGNOSIS — G4733 Obstructive sleep apnea (adult) (pediatric): Secondary | ICD-10-CM | POA: Diagnosis not present

## 2015-11-12 DIAGNOSIS — M05741 Rheumatoid arthritis with rheumatoid factor of right hand without organ or systems involvement: Secondary | ICD-10-CM | POA: Diagnosis not present

## 2015-11-15 DIAGNOSIS — S76012A Strain of muscle, fascia and tendon of left hip, initial encounter: Secondary | ICD-10-CM | POA: Diagnosis not present

## 2015-11-15 DIAGNOSIS — Z6841 Body Mass Index (BMI) 40.0 and over, adult: Secondary | ICD-10-CM | POA: Diagnosis not present

## 2015-11-15 DIAGNOSIS — J449 Chronic obstructive pulmonary disease, unspecified: Secondary | ICD-10-CM | POA: Diagnosis not present

## 2015-11-15 DIAGNOSIS — N3946 Mixed incontinence: Secondary | ICD-10-CM | POA: Diagnosis not present

## 2015-11-22 DIAGNOSIS — Z79899 Other long term (current) drug therapy: Secondary | ICD-10-CM | POA: Diagnosis not present

## 2015-12-04 DIAGNOSIS — R350 Frequency of micturition: Secondary | ICD-10-CM | POA: Diagnosis not present

## 2015-12-04 DIAGNOSIS — R351 Nocturia: Secondary | ICD-10-CM | POA: Diagnosis not present

## 2015-12-04 DIAGNOSIS — N3946 Mixed incontinence: Secondary | ICD-10-CM | POA: Diagnosis not present

## 2015-12-04 DIAGNOSIS — N3944 Nocturnal enuresis: Secondary | ICD-10-CM | POA: Diagnosis not present

## 2015-12-11 DIAGNOSIS — R350 Frequency of micturition: Secondary | ICD-10-CM | POA: Diagnosis not present

## 2015-12-11 DIAGNOSIS — N3946 Mixed incontinence: Secondary | ICD-10-CM | POA: Diagnosis not present

## 2015-12-13 DIAGNOSIS — G4733 Obstructive sleep apnea (adult) (pediatric): Secondary | ICD-10-CM | POA: Diagnosis not present

## 2015-12-13 DIAGNOSIS — M05742 Rheumatoid arthritis with rheumatoid factor of left hand without organ or systems involvement: Secondary | ICD-10-CM | POA: Diagnosis not present

## 2015-12-13 DIAGNOSIS — M05741 Rheumatoid arthritis with rheumatoid factor of right hand without organ or systems involvement: Secondary | ICD-10-CM | POA: Diagnosis not present

## 2015-12-13 DIAGNOSIS — J439 Emphysema, unspecified: Secondary | ICD-10-CM | POA: Diagnosis not present

## 2015-12-13 DIAGNOSIS — R0600 Dyspnea, unspecified: Secondary | ICD-10-CM | POA: Diagnosis not present

## 2015-12-13 DIAGNOSIS — J849 Interstitial pulmonary disease, unspecified: Secondary | ICD-10-CM | POA: Diagnosis not present

## 2015-12-13 DIAGNOSIS — J449 Chronic obstructive pulmonary disease, unspecified: Secondary | ICD-10-CM | POA: Diagnosis not present

## 2015-12-23 DIAGNOSIS — R200 Anesthesia of skin: Secondary | ICD-10-CM | POA: Diagnosis not present

## 2015-12-23 DIAGNOSIS — M5442 Lumbago with sciatica, left side: Secondary | ICD-10-CM | POA: Diagnosis not present

## 2015-12-26 ENCOUNTER — Telehealth: Payer: Self-pay | Admitting: Pulmonary Disease

## 2015-12-26 DIAGNOSIS — N3946 Mixed incontinence: Secondary | ICD-10-CM | POA: Diagnosis not present

## 2015-12-26 DIAGNOSIS — N3944 Nocturnal enuresis: Secondary | ICD-10-CM | POA: Diagnosis not present

## 2015-12-27 NOTE — Telephone Encounter (Signed)
Leigh, do you have these forms?  Have they been completed?  Please advise.

## 2016-01-01 DIAGNOSIS — B078 Other viral warts: Secondary | ICD-10-CM | POA: Diagnosis not present

## 2016-01-01 DIAGNOSIS — L858 Other specified epidermal thickening: Secondary | ICD-10-CM | POA: Diagnosis not present

## 2016-01-01 DIAGNOSIS — B353 Tinea pedis: Secondary | ICD-10-CM | POA: Diagnosis not present

## 2016-01-01 DIAGNOSIS — D485 Neoplasm of uncertain behavior of skin: Secondary | ICD-10-CM | POA: Diagnosis not present

## 2016-01-01 NOTE — Telephone Encounter (Signed)
Forms are on SN cart to complete.  Will let pt know once these are done.

## 2016-01-03 ENCOUNTER — Ambulatory Visit (HOSPITAL_COMMUNITY): Payer: PPO | Attending: Physical Medicine and Rehabilitation | Admitting: Physical Therapy

## 2016-01-03 DIAGNOSIS — M5416 Radiculopathy, lumbar region: Secondary | ICD-10-CM | POA: Insufficient documentation

## 2016-01-03 DIAGNOSIS — R262 Difficulty in walking, not elsewhere classified: Secondary | ICD-10-CM | POA: Diagnosis not present

## 2016-01-03 DIAGNOSIS — M6281 Muscle weakness (generalized): Secondary | ICD-10-CM | POA: Insufficient documentation

## 2016-01-03 NOTE — Telephone Encounter (Signed)
Spoke with Leigh and forms have not been completed at this time. She will make pt aware once forms have been completed.

## 2016-01-03 NOTE — Patient Instructions (Addendum)
Scapular Retraction (Standing)    With arms at sides, pinch shoulder blades together. Repeat __10 x 3 times a day http://orth.exer.us/944   Copyright  VHI. All rights reserved.  Gluteal Sets   While sitting  Tighten buttocks while pressing pelvis to floor. Hold 3____ seconds. Repeat __10__ times per set. Do _1___ sets per session. Do _3___ sessions per day.  http://orth.exer.us/104   Copyright  VHI. All rights reserved.  Isometric Abdominal   While sitting, tighten stomach by pressing elbows down. Hold ___3_ seconds. Repeat 10____ times per set. Do ___1_ sets per session. Do __3__ sessions per day.  http://orth.exer.us/1086   Copyright  VHI. All rights reserved.

## 2016-01-03 NOTE — Therapy (Signed)
[facility] [address] [place], [place], [address] Phone: [phone]   Fax:  [phone]  Physical Therapy Evaluation  Patient Details  Name: JULYSSA BLACKLER MRN: [id number] Date of Birth: December 01, 1940 Referring Provider: Suella Broad  Encounter Date: 01/03/2016      PT End of Session - 01/03/16 1026    Visit Number 1   Number of Visits 8   Date for PT Re-Evaluation 02/02/16   Authorization Type [employer] advantage   Authorization - Visit Number 1   Authorization - Number of Visits 8   PT Start Time 0910   PT Stop Time 0950   PT Time Calculation (min) 40 min   Activity Tolerance Patient tolerated treatment well   Behavior During Therapy [facility] for tasks assessed/performed      Past Medical History:  Diagnosis Date  . Allergic rhinitis   . Asthma   . Asthmatic bronchitis   . Colon polyps   . COPD (chronic obstructive pulmonary disease) ([place])   . Diverticulosis   . GERD (gastroesophageal reflux disease)   . H/O: GI bleed   . Hypercholesterolemia   . Migraine headache   . Obesity   . RLS (restless legs syndrome)     Past Surgical History:  Procedure Laterality Date  . TONSILLECTOMY  x2  . TOTAL ABDOMINAL HYSTERECTOMY  02/17/1989    There were no vitals filed for this visit.       Subjective Assessment - 01/03/16 0943    Subjective Ms. Belkin states that she has had Rt hip pain off and on for a couple of months but had no back pain.  She began to have some numbness in the toes therefore her RA MD sent her to [place].  She states that at this point her pain is significantly better but Dr. Nelva Bush wanted her to come for a few visits for education.     Pertinent History RA, COPD, HTN, osteoporosis, Rt knee arthroscopic surgery    How long can you sit comfortably? Pt states that after she has been sitting in her lift chair for a while her legs will bother her so much that she needs to get up.  Usually about an hour.    How long  can you stand comfortably? 5 minutes    How long can you walk comfortably? limited due to O2;    Diagnostic tests none   Patient Stated Goals to be able to fold clothes without increased pain, To be able to sit through a movie, decreased pain, dance and plant flowers    Currently in Pain? No/denies  worst back pain 8/10; hip 7.10            [facility] PT Assessment - 01/03/16 0001      Assessment   Medical Diagnosis Low back pain    Referring Provider Suella Broad   Onset Date/Surgical Date 09/28/15   Next MD Visit none schedule   Prior Therapy none      Precautions   Precautions None     Restrictions   Weight Bearing Restrictions No     Balance Screen   Has the patient fallen in the past 6 months No   Has the patient had a decrease in activity level because of a fear of falling?  Yes   Is the patient reluctant to leave their home because of a fear of falling?  Yes     [place] residence  Type of [place] entrance     Prior Function   Level of Independence Independent     Cognition   Overall Cognitive Status Within Functional Limits for tasks assessed     Observation/Other Assessments   Focus on Therapeutic Outcomes (FOTO)  69     Functional Tests   Functional tests Single leg stance;Sit to Stand     Single Leg Stance   Comments Lt 20 seconds; Rt 20 seconds      Sit to Stand   Comments 5 x in 21.81 with O2      Posture/Postural Control   Posture/Postural Control Postural limitations   Postural Limitations Rounded Shoulders;Decreased lumbar lordosis;Increased thoracic kyphosis   Posture Comments protruding abdominal      ROM / Strength   AROM / PROM / Strength AROM;Strength     AROM   AROM Assessment Site Lumbar   Lumbar Flexion fingertips 3 inches from floor    Lumbar Extension 19  reps improve pain      Strength   Strength Assessment Site Hip;Knee;Ankle   Right/Left Hip Right;Left   Right  Hip Flexion 4/5   Right Hip Extension 3/5   Right Hip ABduction 5/5   Left Hip Flexion 4/5   Left Hip Extension 3/5   Right/Left Knee Right;Left   Right Knee Flexion 5/5   Right Knee Extension 5/5   Left Knee Flexion 4+/5   Left Knee Extension 5/5   Right/Left Ankle Right;Left   Right Ankle Dorsiflexion 5/5   Right Ankle Plantar Flexion 5/5   Left Ankle Dorsiflexion 5/5   Left Ankle Plantar Flexion 5/5                           PT Education - 01/03/16 1026    Education provided Yes   Education Details Hep   Person(s) Educated Patient   Methods Explanation   Comprehension Verbalized understanding          PT Short Term Goals - 01/03/16 1103      PT SHORT TERM GOAL #1   Title Pt back and hip pain to be no greater than a 5/10 to alow pt to be able to stand for 10 minutes to fold laundry    Time 2   Period Weeks   Status New     PT SHORT TERM GOAL #2   Title Pt to verbalize and demonstrate proper body mechanics to allow her to plant flowers without increased back or hip pain    Time 2   Period Weeks   Status New     PT SHORT TERM GOAL #3   Title Pt to be walking for 10 minutes at a time to improve self back care.    Time 2   Period Weeks           PT Long Term Goals - 01/03/16 1105      PT LONG TERM GOAL #1   Title Pt back and lt  hip pain to be no greater than a 2/10 to allow pt to be able to go dancing again without increased pain    Time 4   Period Weeks   Status New     PT LONG TERM GOAL #2   Title Pt to be walking for 20 minutes at at time to establish self care for back and activity tolerance   Time 4   Period Weeks  Status New     PT LONG TERM GOAL #3   Title Pt to be able to stand for 20 mintues without increased back pain to be able to make a meal without sitting down.    Time 4   Period Weeks               Plan - 01/03/16 1027    Clinical Impression Statement Ms. Kalmbach is a 75 yo female who has been having Lt  hip pain.  The pain was quite severe so she went to the MD who explained that her leg pain was coming from her back and that he would like her to try some therapy. Ms. Quiram has medical history of severe RA and copd with oxygen dependency.   Examination demonstrates postural changes, decreased balance, decreased power, weakened core musculature, decreased activity level and  increased pain.  Ms. Froman will benefit from skilled therapy to address these issues and improve her functioning level.    Rehab Potential Good   PT Frequency 2x / week   PT Duration 4 weeks   PT Treatment/Interventions ADLs/Self Care Home Management;Functional mobility training;Therapeutic activities;Therapeutic exercise;Balance training;Patient/family education;Manual techniques   PT Next Visit Plan begin heel raises; functional squat, sit to stand, 3-Dhip excursion exercises. Progress stabilization exercises as able    PT Home Exercise Plan given for sitting abdominal isometric, scapular retraction and glut sets    Consulted and Agree with Plan of Care Patient      Patient will benefit from skilled therapeutic intervention in order to improve the following deficits and impairments:  Cardiopulmonary status limiting activity, Decreased activity tolerance, Decreased balance, Decreased strength, Difficulty walking, Postural dysfunction, Pain  Visit Diagnosis: Radiculopathy, lumbar region - Plan: PT plan of care cert/re-cert  Difficulty in walking, not elsewhere classified - Plan: PT plan of care cert/re-cert  Muscle weakness (generalized) - Plan: PT plan of care cert/re-cert      G-Codes - [id number]    Functional Assessment Tool Used foto   Functional Limitation Mobility: Walking and moving around   Mobility: Walking and Moving Around Current Status [phone]) At least 20 percent but less than 40 percent impaired, limited or restricted   Mobility: Walking and Moving Around Goal Status [phone]) At least 1 percent but less  than 20 percent impaired, limited or restricted       Problem List Patient Active Problem List   Diagnosis Date Noted  . COPD mixed type ([place]) 03/14/2015  . Rheumatoid arthritis ([place]) 03/14/2015  . Dyspnea 02/06/2015  . ILD (interstitial lung disease) ([place]) 02/06/2015  . Cough 05/17/2011    Rayetta Humphrey, PT CLT [phone] 01/03/2016, 11:16 AM  [facility] [address] [place], [place], [address] Phone: [phone]   Fax:  [phone]  Name: NIESHA SANDI MRN: [id number] Date of Birth: 10/13/1940

## 2016-01-04 ENCOUNTER — Encounter (HOSPITAL_COMMUNITY): Payer: PPO | Admitting: Physical Therapy

## 2016-01-05 NOTE — Telephone Encounter (Signed)
Routing to [facility] for follow up on forms.

## 2016-01-09 ENCOUNTER — Ambulatory Visit (HOSPITAL_COMMUNITY): Payer: PPO | Admitting: Physical Therapy

## 2016-01-09 DIAGNOSIS — M5416 Radiculopathy, lumbar region: Secondary | ICD-10-CM | POA: Diagnosis not present

## 2016-01-09 DIAGNOSIS — R262 Difficulty in walking, not elsewhere classified: Secondary | ICD-10-CM

## 2016-01-09 DIAGNOSIS — M6281 Muscle weakness (generalized): Secondary | ICD-10-CM

## 2016-01-09 NOTE — Patient Instructions (Addendum)
Getting Into / Out of Bed    Lower self to lie down on one side by raising legs and lowering head at the same time. Use arms to assist moving without twisting. Bend both knees to roll onto back if desired. To sit up, start from lying on side, and use same move-ments in reverse. Keep trunk aligned with legs.   Copyright  VHI. All rights reserved.  Stand to Sit / Sit to Stand    To sit: Bend knees to lower self onto front edge of chair, then scoot back on seat. To stand: Reverse sequence by placing one foot forward, and scoot to front of seat. Use rocking motion to stand up.  Copyright  VHI. All rights reserved.

## 2016-01-09 NOTE — Therapy (Signed)
[facility] [address] [place], [place], [address] Phone: [phone]   Fax:  [phone]  Physical Therapy Treatment  Patient Details  Name: [name] MRN: [id number] Date of Birth: 07-26-1940 Referring Provider: Suella Broad  Encounter Date: 01/09/2016      PT End of Session - 01/09/16 0928    Visit Number 2   Number of Visits 8   Date for PT Re-Evaluation 02/02/16   Authorization Type [employer] advantage   Authorization - Visit Number 2   Authorization - Number of Visits 8   PT Start Time 0900   PT Stop Time 0945   PT Time Calculation (min) 45 min   Equipment Utilized During Treatment Other (comment)   Activity Tolerance Patient tolerated treatment well   Behavior During Therapy [facility] for tasks assessed/performed      Past Medical History:  Diagnosis Date  . Allergic rhinitis   . Asthma   . Asthmatic bronchitis   . Colon polyps   . COPD (chronic obstructive pulmonary disease) ([place])   . Diverticulosis   . GERD (gastroesophageal reflux disease)   . H/O: GI bleed   . Hypercholesterolemia   . Migraine headache   . Obesity   . RLS (restless legs syndrome)     Past Surgical History:  Procedure Laterality Date  . TONSILLECTOMY  x2  . TOTAL ABDOMINAL HYSTERECTOMY  02/17/1989    There were no vitals filed for this visit.      Subjective Assessment - 01/09/16 0915    Subjective Pt states that she has been doing her exercises but is unsure if she is doing them correctly    Pertinent History RA, COPD, HTN, osteoporosis, Rt knee arthroscopic surgery    How long can you sit comfortably? Pt states that after she has been sitting in her lift chair for a while her legs will bother her so much that she needs to get up.  Usually about an hour.    How long can you stand comfortably? 5 minutes    How long can you walk comfortably? limited due to O2;    Diagnostic tests none   Patient Stated Goals to be able to fold clothes  without increased pain, To be able to sit through a movie, decreased pain, dance and plant flowers    Currently in Pain? No/denies  just short of breath.                          [facility] Adult PT Treatment/Exercise - 01/09/16 0001      Posture/Postural Control   Posture/Postural Control Postural limitations   Postural Limitations Rounded Shoulders;Decreased lumbar lordosis;Increased thoracic kyphosis   Posture Comments protruding abdominal      Exercises   Exercises Lumbar     Lumbar Exercises: Seated   Sit to Stand 5 reps   Other Seated Lumbar Exercises push with feet pull head to ceiling.       Lumbar Exercises: Supine   Ab Set 5 reps  xiphoid to pubic, ribs to opposite ASIS, and 6 star x 5 each   Other Supine Lumbar Exercises Rolling to Rt/ Lt / sit to sidelying/sidelying to sit x 5                 PT Education - 01/09/16 0926    Education provided Yes   Education Details body mechanics sit to supine, supine to sit and stand to sit  PT Short Term Goals - 01/09/16 1154      PT SHORT TERM GOAL #1   Title Pt back and hip pain to be no greater than a 5/10 to alow pt to be able to stand for 10 minutes to fold laundry    Time 2   Period Weeks   Status On-going     PT SHORT TERM GOAL #2   Title Pt to verbalize and demonstrate proper body mechanics to allow her to plant flowers without increased back or hip pain    Time 2   Period Weeks   Status On-going     PT SHORT TERM GOAL #3   Title Pt to be walking for 10 minutes at a time to improve self back care.    Time 2   Period Weeks   Status On-going           PT Long Term Goals - 01/09/16 1154      PT LONG TERM GOAL #1   Title Pt back and lt  hip pain to be no greater than a 2/10 to allow pt to be able to go dancing again without increased pain    Time 4   Period Weeks   Status On-going     PT LONG TERM GOAL #2   Title Pt to be walking for 20 minutes at at time to establish  self care for back and activity tolerance   Time 4   Period Weeks   Status On-going     PT LONG TERM GOAL #3   Title Pt to be able to stand for 20 mintues without increased back pain to be able to make a meal without sitting down.    Time 4   Period Weeks   Status On-going               Plan - 01/09/16 1147    Clinical Impression Statement Evaluation and goals reviewed with pt.  Todays treatment concentrated on posture exercises and education in body mechanics.  With pt able to demonstrate good return after verbal and manual cuing.    Rehab Potential Good   PT Frequency 2x / week   PT Duration 4 weeks   PT Treatment/Interventions ADLs/Self Care Home Management;Functional mobility training;Therapeutic activities;Therapeutic exercise;Balance training;Patient/family education;Manual techniques   PT Next Visit Plan begin, ischial tuberosity, and tailbone to pubic rami isometrics then combine the two, decompression exercisises 4 and 5 and practice sit to stand.  Go over body mechanics and ADL activity for good body mechanics    PT Home Exercise Plan given for sitting abdominal isometric, scapular retraction and glut sets ; decompression 1-5 and body mechanic sheet.    Consulted and Agree with Plan of Care Patient      Patient will benefit from skilled therapeutic intervention in order to improve the following deficits and impairments:  Cardiopulmonary status limiting activity, Decreased activity tolerance, Decreased balance, Decreased strength, Difficulty walking, Postural dysfunction, Pain  Visit Diagnosis: Radiculopathy, lumbar region  Difficulty in walking, not elsewhere classified  Muscle weakness (generalized)     Problem List Patient Active Problem List   Diagnosis Date Noted  . COPD mixed type ([place]) 03/14/2015  . Rheumatoid arthritis ([place]) 03/14/2015  . Dyspnea 02/06/2015  . ILD (interstitial lung disease) ([place]) 02/06/2015  . Cough 05/17/2011    Rayetta Humphrey, PT CLT [phone] 01/09/2016, 11:57 AM  [facility] [place], [place], [address] Phone: [phone]  Fax:  [phone]  Name: [name] MRN: [id number] Date of Birth: 05/14/1940

## 2016-01-10 ENCOUNTER — Ambulatory Visit (HOSPITAL_COMMUNITY): Payer: PPO | Admitting: Physical Therapy

## 2016-01-10 DIAGNOSIS — M5416 Radiculopathy, lumbar region: Secondary | ICD-10-CM

## 2016-01-10 DIAGNOSIS — M6281 Muscle weakness (generalized): Secondary | ICD-10-CM

## 2016-01-10 DIAGNOSIS — R262 Difficulty in walking, not elsewhere classified: Secondary | ICD-10-CM

## 2016-01-10 NOTE — Telephone Encounter (Signed)
Forms have been faxed back to [facility] [employer].

## 2016-01-10 NOTE — Therapy (Signed)
[facility] [address] [place], [place], [address] Phone: [phone]   Fax:  [phone]  Physical Therapy Treatment  Patient Details  Name: [name] MRN: [id number] Date of Birth: 22-Jan-1941 Referring Provider: Suella Broad  Encounter Date: 01/10/2016      PT End of Session - 01/10/16 1017    Visit Number 3   Number of Visits 8   Date for PT Re-Evaluation 02/02/16   Authorization Type [employer]   Authorization - Visit Number 3   Authorization - Number of Visits 8   PT Start Time [phone]   PT Stop Time 1032   PT Time Calculation (min) 44 min   Equipment Utilized During Treatment Other (comment)   Activity Tolerance Patient tolerated treatment well   Behavior During Therapy [facility] for tasks assessed/performed      Past Medical History:  Diagnosis Date  . Allergic rhinitis   . Asthma   . Asthmatic bronchitis   . Colon polyps   . COPD (chronic obstructive pulmonary disease) ([place])   . Diverticulosis   . GERD (gastroesophageal reflux disease)   . H/O: GI bleed   . Hypercholesterolemia   . Migraine headache   . Obesity   . RLS (restless legs syndrome)     Past Surgical History:  Procedure Laterality Date  . TONSILLECTOMY  x2  . TOTAL ABDOMINAL HYSTERECTOMY  02/17/1989    There were no vitals filed for this visit.      Subjective Assessment - 01/10/16 0953    Subjective Pt is doing her exercises and has no questions    Pertinent History RA, COPD, HTN, osteoporosis, Rt knee arthroscopic surgery    How long can you sit comfortably? Pt states that after she has been sitting in her lift chair for a while her legs will bother her so much that she needs to get up.  Usually about an hour.    How long can you stand comfortably? 5 minutes    How long can you walk comfortably? limited due to O2;    Diagnostic tests none   Patient Stated Goals to be able to fold clothes without increased pain, To be able to sit  through a movie, decreased pain, dance and plant flowers    Currently in Pain? No/denies                         [facility] Adult PT Treatment/Exercise - 01/10/16 0001      Posture/Postural Control   Posture/Postural Control Postural limitations   Postural Limitations Rounded Shoulders;Decreased lumbar lordosis;Increased thoracic kyphosis   Posture Comments protruding abdominal      Exercises   Exercises Lumbar     Lumbar Exercises: Seated   Sit to Stand 5 reps   Other Seated Lumbar Exercises push with feet pull head to ceiling.       Lumbar Exercises: Supine   Ab Set 5 reps  xiphoid to pubic, ribs to opposite ASIS, and 6 star x 5 each   Bent Knee Raise --   Large Ball Oblique Isometric Limitations pulling ischial bones together, pelvic to tailbone; then all four together x 5 each    Other Supine Lumbar Exercises Rolling to Rt/ Lt / sit to sidelying/sidelying to sit x 5    Other Supine Lumbar Exercises decompressive exercises 1-5                 PT Education - 01/09/16 [id number]  Education provided Yes   Education Details body mechanics sit to supine, supine to sit and stand to sit           PT Short Term Goals - 01/09/16 1154      PT SHORT TERM GOAL #1   Title Pt back and hip pain to be no greater than a 5/10 to alow pt to be able to stand for 10 minutes to fold laundry    Time 2   Period Weeks   Status On-going     PT SHORT TERM GOAL #2   Title Pt to verbalize and demonstrate proper body mechanics to allow her to plant flowers without increased back or hip pain    Time 2   Period Weeks   Status On-going     PT SHORT TERM GOAL #3   Title Pt to be walking for 10 minutes at a time to improve self back care.    Time 2   Period Weeks   Status On-going           PT Long Term Goals - 01/09/16 1154      PT LONG TERM GOAL #1   Title Pt back and lt  hip pain to be no greater than a 2/10 to allow pt to be able to go dancing again without  increased pain    Time 4   Period Weeks   Status On-going     PT LONG TERM GOAL #2   Title Pt to be walking for 20 minutes at at time to establish self care for back and activity tolerance   Time 4   Period Weeks   Status On-going     PT LONG TERM GOAL #3   Title Pt to be able to stand for 20 mintues without increased back pain to be able to make a meal without sitting down.    Time 4   Period Weeks   Status On-going               Plan - 01/10/16 1017    Clinical Impression Statement Pt instructed in dowel squat and hip hinge to improve posture during functional activity.  Continued with decompressive 1-5; added ischial turberosity and pelvic bone to tailbone isometrics    Rehab Potential Good   PT Frequency 2x / week   PT Duration 4 weeks   PT Treatment/Interventions ADLs/Self Care Home Management;Functional mobility training;Therapeutic activities;Therapeutic exercise;Balance training;Patient/family education;Manual techniques   PT Next Visit Plan begin bent knee raise; arm raise in supine    PT Home Exercise Plan given for sitting abdominal isometric, scapular retraction and glut sets ; decompression 1-5 and body mechanic sheet.    Consulted and Agree with Plan of Care Patient      Patient will benefit from skilled therapeutic intervention in order to improve the following deficits and impairments:  Cardiopulmonary status limiting activity, Decreased activity tolerance, Decreased balance, Decreased strength, Difficulty walking, Postural dysfunction, Pain  Visit Diagnosis: Radiculopathy, lumbar region  Difficulty in walking, not elsewhere classified  Muscle weakness (generalized)     Problem List Patient Active Problem List   Diagnosis Date Noted  . COPD mixed type ([place]) 03/14/2015  . Rheumatoid arthritis ([place]) 03/14/2015  . Dyspnea 02/06/2015  . ILD (interstitial lung disease) ([place]) 02/06/2015  . Cough 05/17/2011   Rayetta Humphrey, PT  CLT [phone]  01/10/2016, 10:31 AM  [place], [place], [address] Phone: [phone]  Fax:  [phone]  Name: [name] MRN: [id number] Date of Birth: [birth year]

## 2016-01-13 DIAGNOSIS — J439 Emphysema, unspecified: Secondary | ICD-10-CM | POA: Diagnosis not present

## 2016-01-13 DIAGNOSIS — J449 Chronic obstructive pulmonary disease, unspecified: Secondary | ICD-10-CM | POA: Diagnosis not present

## 2016-01-13 DIAGNOSIS — R0600 Dyspnea, unspecified: Secondary | ICD-10-CM | POA: Diagnosis not present

## 2016-01-13 DIAGNOSIS — M05741 Rheumatoid arthritis with rheumatoid factor of right hand without organ or systems involvement: Secondary | ICD-10-CM | POA: Diagnosis not present

## 2016-01-13 DIAGNOSIS — G4733 Obstructive sleep apnea (adult) (pediatric): Secondary | ICD-10-CM | POA: Diagnosis not present

## 2016-01-13 DIAGNOSIS — M05742 Rheumatoid arthritis with rheumatoid factor of left hand without organ or systems involvement: Secondary | ICD-10-CM | POA: Diagnosis not present

## 2016-01-13 DIAGNOSIS — J849 Interstitial pulmonary disease, unspecified: Secondary | ICD-10-CM | POA: Diagnosis not present

## 2016-01-15 ENCOUNTER — Ambulatory Visit (HOSPITAL_COMMUNITY): Payer: PPO | Attending: Physical Medicine and Rehabilitation | Admitting: Physical Therapy

## 2016-01-15 DIAGNOSIS — M6281 Muscle weakness (generalized): Secondary | ICD-10-CM | POA: Insufficient documentation

## 2016-01-15 DIAGNOSIS — R262 Difficulty in walking, not elsewhere classified: Secondary | ICD-10-CM | POA: Diagnosis not present

## 2016-01-15 DIAGNOSIS — M5416 Radiculopathy, lumbar region: Secondary | ICD-10-CM | POA: Diagnosis not present

## 2016-01-15 NOTE — Patient Instructions (Addendum)
Bent Leg Lift (Hook-Lying)    Tighten stomach and slowly raise right leg __4__ inches from floor. Keep trunk rigid. Hold _4___ seconds. Repeat ___10_ times per set. Do _1___ sets per session. Do ___1_ sessions per day.  http://orth.exer.us/1090   Copyright  VHI. All rights reserved.  Extremity Flexion (Hook-Lying)   Keep left arm on the floor  Tighten stomach and slowly raise  right arm over head until back begins to arch. Keep trunk rigid. Repeat to the left arm  Repeat __10__ times per set. Do ___1_ sets per session. Do __2__ sessions per day.  http://orth.exer.us/1088   Copyright  VHI. All rights reserved.

## 2016-01-15 NOTE — Therapy (Signed)
[facility] [address] [place], [place], [address] Phone: [phone]   Fax:  [phone]  Physical Therapy Treatment  Patient Details  Name: [name] MRN: [id number] Date of Birth: [birth year] Referring Provider: Suella Broad  Encounter Date: 01/15/2016      PT End of Session - 01/15/16 1011    Visit Number 4   Number of Visits 8   Date for PT Re-Evaluation 02/02/16   Authorization Type [employer] advantage   Authorization - Visit Number 4   Authorization - Number of Visits 8   PT Start Time 0945   PT Stop Time 1031   PT Time Calculation (min) 46 min   Equipment Utilized During Treatment Other (comment)   Activity Tolerance Patient tolerated treatment well   Behavior During Therapy [facility] for tasks assessed/performed      Past Medical History:  Diagnosis Date  . Allergic rhinitis   . Asthma   . Asthmatic bronchitis   . Colon polyps   . COPD (chronic obstructive pulmonary disease) ([place])   . Diverticulosis   . GERD (gastroesophageal reflux disease)   . H/O: GI bleed   . Hypercholesterolemia   . Migraine headache   . Obesity   . RLS (restless legs syndrome)     Past Surgical History:  Procedure Laterality Date  . TONSILLECTOMY  x2  . TOTAL ABDOMINAL HYSTERECTOMY  02/17/1989    There were no vitals filed for this visit.      Subjective Assessment - 01/15/16 0946    Subjective Pt having increased Lt knee pain this morning.  Pt states that she is doing her exercises over the weekend.  States she has to make herself do the exercise    Pertinent History RA, COPD, HTN, osteoporosis, Rt knee arthroscopic surgery    How long can you sit comfortably? Pt states that after she has been sitting in her lift chair for a while her legs will bother her so much that she needs to get up.  Usually about an hour.    How long can you stand comfortably? 5 minutes    How long can you walk comfortably? limited due to O2;    Diagnostic  tests none   Patient Stated Goals to be able to fold clothes without increased pain, To be able to sit through a movie, decreased pain, dance and plant flowers    Currently in Pain? Yes   Pain Score 2    Pain Location Knee   Pain Orientation Left   Pain Descriptors / Indicators Aching   Pain Type Acute pain   Pain Onset Yesterday   Aggravating Factors  walking   Pain Relieving Factors non weight bearing                          [facility] Adult PT Treatment/Exercise - 01/15/16 0001      Posture/Postural Control   Posture/Postural Control Postural limitations   Postural Limitations Rounded Shoulders;Decreased lumbar lordosis;Increased thoracic kyphosis   Posture Comments protruding abdominal      Exercises   Exercises Lumbar     Lumbar Exercises: Stretches   Active Hamstring Stretch 2 reps;30 seconds     Lumbar Exercises: Standing   Other Standing Lumbar Exercises wall arch x 5      Lumbar Exercises: Seated   Sit to Stand 10 reps   Other Seated Lumbar Exercises push with feet pull head to ceiling.  Lumbar Exercises: Supine   Ab Set 5 reps;10 reps  xiphoid to pubic, ribs to opposite ASIS, and 6 star x 5 each   Bent Knee Raise 10 reps   Dead Bug Limitations single arm raise only x 5 each going into full flexion and stretching away from body.    Large Ball Oblique Isometric Limitations ischial; pelvic and tailbone pull together x 10    Other Supine Lumbar Exercises --   Other Supine Lumbar Exercises decompressive exercises 1-5                   PT Short Term Goals - 01/15/16 1021      PT SHORT TERM GOAL #1   Title Pt back and hip pain to be no greater than a 5/10 to alow pt to be able to stand for 10 minutes to fold laundry    Time 2   Period Weeks   Status Achieved     PT SHORT TERM GOAL #2   Title Pt to verbalize and demonstrate proper body mechanics to allow her to plant flowers without increased back or hip pain    Time 2   Period  Weeks   Status Achieved     PT SHORT TERM GOAL #3   Title Pt to be walking for 10 minutes at a time to improve self back care.    Time 2   Period Weeks   Status On-going           PT Long Term Goals - 01/15/16 1024      PT LONG TERM GOAL #1   Title Pt back and lt  hip pain to be no greater than a 2/10 to allow pt to be able to go dancing again without increased pain    Time 4   Period Weeks   Status On-going     PT LONG TERM GOAL #2   Title Pt to be walking for 20 minutes at at time to establish self care for back and activity tolerance   Time 4   Period Weeks   Status On-going     PT LONG TERM GOAL #3   Title Pt to be able to stand for 20 mintues without increased back pain to be able to make a meal without sitting down.    Time 4   Period Weeks   Status On-going               Plan - 01/15/16 1011    Clinical Impression Statement Added bent knee raise, arm flexion and standing wall arch to pt program with good return demonstration.  Pt able to demonstrate good technique of previous exercises given.    Rehab Potential Good   PT Frequency 2x / week   PT Duration 4 weeks   PT Treatment/Interventions ADLs/Self Care Home Management;Functional mobility training;Therapeutic activities;Therapeutic exercise;Balance training;Patient/family education;Manual techniques   PT Next Visit Plan add bridging and hip abduction exercises.    PT Home Exercise Plan given for sitting abdominal isometric, scapular retraction and glut sets ; decompression 1-5 and body mechanic sheet. bent knee lift and arm raise.    Consulted and Agree with Plan of Care Patient      Patient will benefit from skilled therapeutic intervention in order to improve the following deficits and impairments:  Cardiopulmonary status limiting activity, Decreased activity tolerance, Decreased balance, Decreased strength, Difficulty walking, Postural dysfunction, Pain  Visit Diagnosis: Difficulty in walking, not  elsewhere classified  Muscle weakness (generalized)  Radiculopathy, lumbar region     Problem List Patient Active Problem List   Diagnosis Date Noted  . COPD mixed type ([place]) 03/14/2015  . Rheumatoid arthritis ([place]) 03/14/2015  . Dyspnea 02/06/2015  . ILD (interstitial lung disease) ([place]) 02/06/2015  . Cough 05/17/2011   Rayetta Humphrey, PT CLT [phone] 01/15/2016, 10:35 AM  [facility] [address] [place], [place], [address] Phone: [phone]   Fax:  [phone]  Name: [name] MRN: [id number] Date of Birth: 1940/05/22

## 2016-01-16 ENCOUNTER — Ambulatory Visit (HOSPITAL_COMMUNITY): Payer: PPO

## 2016-01-16 ENCOUNTER — Other Ambulatory Visit: Payer: Self-pay | Admitting: Geriatric Medicine

## 2016-01-16 DIAGNOSIS — Z1231 Encounter for screening mammogram for malignant neoplasm of breast: Secondary | ICD-10-CM

## 2016-01-16 DIAGNOSIS — R262 Difficulty in walking, not elsewhere classified: Secondary | ICD-10-CM

## 2016-01-16 DIAGNOSIS — M6281 Muscle weakness (generalized): Secondary | ICD-10-CM

## 2016-01-16 DIAGNOSIS — M5416 Radiculopathy, lumbar region: Secondary | ICD-10-CM

## 2016-01-16 NOTE — Therapy (Signed)
[place], [place], [address] Phone: [phone]   Fax:  [phone]  Physical Therapy Treatment  Patient Details  Name: [name] MRN: [id number] Date of Birth: 12-22-1940 Referring Provider: Suella Broad  Encounter Date: 01/16/2016      PT End of Session - 01/16/16 0910    Visit Number 5   Number of Visits 8   Date for PT Re-Evaluation 02/02/16   Authorization Type [employer] advantage   Authorization - Visit Number 5   Authorization - Number of Visits 8   PT Start Time 0902   PT Stop Time 0945   PT Time Calculation (min) 43 min   Equipment Utilized During Treatment Oxygen   Activity Tolerance Patient tolerated treatment well   Behavior During Therapy [facility] for tasks assessed/performed      Past Medical History:  Diagnosis Date  . Allergic rhinitis   . Asthma   . Asthmatic bronchitis   . Colon polyps   . COPD (chronic obstructive pulmonary disease) ([place])   . Diverticulosis   . GERD (gastroesophageal reflux disease)   . H/O: GI bleed   . Hypercholesterolemia   . Migraine headache   . Obesity   . RLS (restless legs syndrome)     Past Surgical History:  Procedure Laterality Date  . TONSILLECTOMY  x2  . TOTAL ABDOMINAL HYSTERECTOMY  02/17/1989    There were no vitals filed for this visit.      Subjective Assessment - 01/16/16 0905    Subjective Pt stated her back is feeling better.  Reports she continues to have intermittent Lt knee pain  with movements especially sit to stands pain scale 7/10 today   Pertinent History RA, COPD, HTN, osteoporosis, Rt knee arthroscopic surgery    Patient Stated Goals to be able to fold clothes without increased pain, To be able to sit through a movie, decreased pain, dance and plant flowers    Currently in Pain? Yes   Pain Score 7    Pain Location Knee   Pain Orientation Left   Pain Descriptors / Indicators Sharp   Pain Type Acute pain   Pain Onset In  the past 7 days   Pain Frequency Intermittent   Aggravating Factors  walking, sit to stand   Pain Relieving Factors non weight bearing                         [facility] Adult PT Treatment/Exercise - 01/16/16 0001      Posture/Postural Control   Posture/Postural Control Postural limitations   Postural Limitations Rounded Shoulders;Decreased lumbar lordosis;Increased thoracic kyphosis   Posture Comments protruding abdominal      Exercises   Exercises Lumbar     Lumbar Exercises: Stretches   Active Hamstring Stretch 2 reps;30 seconds   Active Hamstring Stretch Limitations supine with rope     Lumbar Exercises: Seated   Sit to Stand 10 reps   Other Seated Lumbar Exercises push with feet pull head to ceiling.       Lumbar Exercises: Supine   Ab Set 5 reps;10 reps  xiphoid to pubic; ribs to opposite ASIS and 6 star x 5 each   Dead Bug Limitations 7 reps dead bug with 5" holds   Bridge 10 reps   Large Ball Oblique Isometric Limitations ischial; pelvic and tailbone pull together x 10    Other Supine Lumbar Exercises decompressive exercises 1-5  sidelying abd BLE 10x              PT Short Term Goals - 01/15/16 1021      PT SHORT TERM GOAL #1   Title Pt back and hip pain to be no greater than a 5/10 to alow pt to be able to stand for 10 minutes to fold laundry    Time 2   Period Weeks   Status Achieved     PT SHORT TERM GOAL #2   Title Pt to verbalize and demonstrate proper body mechanics to allow her to plant flowers without increased back or hip pain    Time 2   Period Weeks   Status Achieved     PT SHORT TERM GOAL #3   Title Pt to be walking for 10 minutes at a time to improve self back care.    Time 2   Period Weeks   Status On-going           PT Long Term Goals - 01/15/16 1024      PT LONG TERM GOAL #1   Title Pt back and lt  hip pain to be no greater than a 2/10 to allow pt to be able to go dancing again without increased pain     Time 4   Period Weeks   Status On-going     PT LONG TERM GOAL #2   Title Pt to be walking for 20 minutes at at time to establish self care for back and activity tolerance   Time 4   Period Weeks   Status On-going     PT LONG TERM GOAL #3   Title Pt to be able to stand for 20 mintues without increased back pain to be able to make a meal without sitting down.    Time 4   Period Weeks   Status On-going               Plan - 01/16/16 [id number]    Clinical Impression Statement Added bridging and hip abduction for proximal musculature strengthening to improve lumbar stability.  Pt able to demonstrate good technqiue with minimal cueing required.   Rehab Potential Good   PT Frequency 2x / week   PT Duration 4 weeks   PT Treatment/Interventions ADLs/Self Care Home Management;Functional mobility training;Therapeutic activities;Therapeutic exercise;Balance training;Patient/family education;Manual techniques   PT Next Visit Plan Progress proximal musculature strengthening to assist with sit to stand   PT Home Exercise Plan given for sitting abdominal isometric, scapular retraction and glut sets ; decompression 1-5 and body mechanic sheet. bent knee lift and arm raise. ; 10/03 addition with bridges      Patient will benefit from skilled therapeutic intervention in order to improve the following deficits and impairments:  Cardiopulmonary status limiting activity, Decreased activity tolerance, Decreased balance, Decreased strength, Difficulty walking, Postural dysfunction, Pain  Visit Diagnosis: Difficulty in walking, not elsewhere classified  Muscle weakness (generalized)  Radiculopathy, lumbar region     Problem List Patient Active Problem List   Diagnosis Date Noted  . COPD mixed type ([place]) 03/14/2015  . Rheumatoid arthritis ([place]) 03/14/2015  . Dyspnea 02/06/2015  . ILD (interstitial lung disease) ([place]) 02/06/2015  . Cough 05/17/2011   Ihor Austin, LPTA;  [place]  Aldona Lento 01/16/2016, 10:14 AM  [facility] [address] [place], [place], [address] Phone: [phone]   Fax:  [phone]  Name: [name] MRN: [id number] Date of Birth:  01/30/1941    

## 2016-01-16 NOTE — Patient Instructions (Signed)
Bridge    Lie back, legs bent. Inhale, pressing hips up. Keeping ribs in, lengthen lower back. Exhale, rolling down along spine from top. Repeat 10 times. Do 2 sessions per day.  http://pm.exer.us/55   Copyright  VHI. All rights reserved.   

## 2016-01-17 DIAGNOSIS — Z23 Encounter for immunization: Secondary | ICD-10-CM | POA: Diagnosis not present

## 2016-01-17 DIAGNOSIS — Z79899 Other long term (current) drug therapy: Secondary | ICD-10-CM | POA: Diagnosis not present

## 2016-01-17 DIAGNOSIS — I10 Essential (primary) hypertension: Secondary | ICD-10-CM | POA: Diagnosis not present

## 2016-01-17 DIAGNOSIS — J449 Chronic obstructive pulmonary disease, unspecified: Secondary | ICD-10-CM | POA: Diagnosis not present

## 2016-01-17 DIAGNOSIS — Z6841 Body Mass Index (BMI) 40.0 and over, adult: Secondary | ICD-10-CM | POA: Diagnosis not present

## 2016-01-17 DIAGNOSIS — Z7689 Persons encountering health services in other specified circumstances: Secondary | ICD-10-CM | POA: Diagnosis not present

## 2016-01-17 DIAGNOSIS — I872 Venous insufficiency (chronic) (peripheral): Secondary | ICD-10-CM | POA: Diagnosis not present

## 2016-01-17 DIAGNOSIS — E7800 Pure hypercholesterolemia, unspecified: Secondary | ICD-10-CM | POA: Diagnosis not present

## 2016-01-17 DIAGNOSIS — K219 Gastro-esophageal reflux disease without esophagitis: Secondary | ICD-10-CM | POA: Diagnosis not present

## 2016-01-17 DIAGNOSIS — R7301 Impaired fasting glucose: Secondary | ICD-10-CM | POA: Diagnosis not present

## 2016-01-22 ENCOUNTER — Encounter: Payer: Self-pay | Admitting: Pulmonary Disease

## 2016-01-22 ENCOUNTER — Ambulatory Visit (INDEPENDENT_AMBULATORY_CARE_PROVIDER_SITE_OTHER): Payer: PPO | Admitting: Pulmonary Disease

## 2016-01-22 VITALS — BP 130/86 | HR 85 | Temp 97.9°F | Ht 67.0 in | Wt 268.0 lb

## 2016-01-22 DIAGNOSIS — J849 Interstitial pulmonary disease, unspecified: Secondary | ICD-10-CM

## 2016-01-22 DIAGNOSIS — M0579 Rheumatoid arthritis with rheumatoid factor of multiple sites without organ or systems involvement: Secondary | ICD-10-CM

## 2016-01-22 DIAGNOSIS — J449 Chronic obstructive pulmonary disease, unspecified: Secondary | ICD-10-CM

## 2016-01-22 MED ORDER — BUDESONIDE-FORMOTEROL FUMARATE 160-4.5 MCG/ACT IN AERO: 2.0000 | INHALATION_SPRAY | Freq: Two times a day (BID) | RESPIRATORY_TRACT | 0 refills | Status: DC

## 2016-01-22 MED ORDER — TIOTROPIUM BROMIDE MONOHYDRATE 1.25 MCG/ACT IN AERS: 2.0000 | INHALATION_SPRAY | Freq: Every day | RESPIRATORY_TRACT | 0 refills | Status: DC

## 2016-01-22 MED ORDER — LEVOFLOXACIN 500 MG PO TABS: 500.0000 mg | ORAL_TABLET | Freq: Every day | ORAL | 1 refills | Status: DC

## 2016-01-22 NOTE — Progress Notes (Signed)
Subjective:     Patient ID: [name], female   DOB: 1941-03-15, 75 y.o.   MRN: [id number]  HPI 75 y/o WF whose PCP is DrStoneking and followed by Rheum-DrHawkes w/ seropos RA, underlying ILD, and superimposed mixed COPD (emphysema and chr bronchitis) + exercise induced hypoxemia, & DJD; on Arava & low dose Pred plus formal PT, Xopenex NEBS Tid, Symbicort160-2spBid, Spiriva daily, & started on Home O2 (11/16) at 2L/min w/ exercise...  ~  March 29, 2011:  Initial pulmonary consult w/ KC>        The patient is a 75year-old female who I've been asked to see for ongoing pulmonary issues. She has been given the diagnosis of COPD years ago, but also tells me that she was diagnosed with asthma in her late 61s and put on a nebulizer. The patient thinks that her breathing is worsening, and she has had 3-4 episodes this year that have been labeled as "acute exacerbation" her last flareup was the end of November, and she was treated with antibiotics and prednisone. The patient notes a one block dyspnea on exertion at a moderate pace on flat ground. She will also get winded bringing groceries in from the car, and doing any kind of light housework. She notes very significant reflux symptoms, despite being on a proton pump inhibitor. She notes a barking cough at night and also first thing in the morning upon arising. The patient currently is on Symbicort, and has taken this compliantly. She has had no recent chest x-ray or full pulmonary function studies. Of note, her weight is neutral over the last one year.       PLAN>> I suspect, with the patient's long history of tobacco abuse, that she probably does have some degree of chronic airflow obstruction. It is unclear how much asthma may be playing a role here. She has had multiple episodes this year that required antibiotics and prednisone, and I wonder how much of this is related to her underlying lung issues or possibly laryngopharyngeal reflux. She is  having persistent reflux symptoms despite taking a proton pump inhibitor. She also describes cyclical coughing as well, which is typically an upper airway issue. I would like to check a chest x-ray today, and schedule her for full pulmonary function studies. I would also like to treat her reflux more aggressively over the next 3-4 weeks. We'll also add Spiriva to her Symbicort to see if this helps  CXR 03/29/11 showed borderline heart size, ectasia of thoracic Ao, low flat diaphragms, essentially clear lungs, osteopenia & degen spondylosis in spine...  FullPFTs 05/17/11 showed FVC=3.11 (99%), FEV1=1.67 (74%), %1sec=54, mid-flows reduced at 18% predicted; no improvement in FEV1 post bronchodil; TLC=4.52 (84%), RV=1.41 (65%), RV/TLC=31%; DLCO=50% predicted.=> c/w mod airflow obstruction, borderline lung volumes, moderate decr in diffusion capacity...   ~  May 17, 2011:  ROV w/ KC>        Patient comes in today for followup after her pulmonary function studies. She also had a cough at the last visit that I felt was more upper airway in origin. She was started on a more aggressive regimen for reflux, but has not seen a big difference. Now, she is having a lot of sinus congestion and pressure, and tells me that she recently was started on CPAP. Her heater on the humidifier is set fairly low based on her just her a period she was also started on Spiriva at the last visit, and has seen improvement in her breathing since  that time.      PLAN>>  The patient's cough seems more upper airway in origin, and I had tried her on a more intensified regimen for reflux disease. She saw some improvement, but not a lot. She is having a lot of nasal congestion and pressure, but has recently been started on CPAP and has her heated humidity at a low level. I have asked her to turn the heat up on the humidifier, and have recommended sinus rinses a.m. and p.m. Until she improves. If she continues to have issues, it is  possible that she may have a sinus infection. I would like to treat for reflux more aggressively for another 4 weeks, but if she does not see a big change would go back to her prior dose of proton pump inhibitor.      The patient has moderate airflow obstruction by pulmonary function studies today, and based on history, most consistent with a combination of emphysema and asthma. She has seen considerable improvement with the addition of Spiriva, and therefore I will leave her on this along with her Symbicort. I stressed to her the importance of weight loss and conditioning, and recommended referral to pulmonary rehabilitation.  Full PFTs 05/17/11>  FVC=3.11 (99%), FEV1=1.67 (74%), %1sec=54; mid-flows reduced at 18% predicted, after bronchodil her FEV1 improved 2%; TLC=4.52 (84%), RV=1.41 (65%), RV/TLC=31%; DLCO=50%pred & DL/VA=103%;  This shows a mod obstructive ventilatory defect, no signif response to bronchodil, borderline reduced lung vols, & reduced diffusion capacity...  ~  November 15, 2011:  41mo ROV w/ KC>        Patient comes in today for followup of her known COPD. She has seen a significant improvement in her breathing with the addition of Spiriva. She has not had any acute exacerbation since last visit, nor has she required her rescue inhaler. Her cough is also much improved with b.i.d. Proton pump inhibitor, and also making adjustments on her CPAP humidifier.      PLAN>>  Much improved with increased dose of PPI, and also with adjustment of heater on her cpap machine.  The patient is doing well on Symbicort and Spiriva, and has not had any flareups since the last visit. I have stressed to her the importance of weight loss and some type of exercise program in order to improve her exertional tolerance. She also feels that her cough is much improved since being on b.i.d. Proton pump inhibitor, and we'll continue this. If she is doing well, she is to follow up in 6 months (Pt cancelled f/u appt  05/2012 & never rescheduled).  CT scans done by DrStoneking>   CT Chest 07/12/13 showed norm heart size, atherosclerotic changes in Ao & coronaries, no adenopathy; biapical pleuroparenchymal scarring & emphysema, scattered subpleural nodules- 33mm LUL, 54mm RUL, 40mm RML, 33mm RLL...   CT Chest 02/02/14 showed norm heart size, calcif atherosclerotic Ao & coronaries, no adenopathy, centrilob & paraseptal emphysema noted, mult small nonspecific pulm nodules- 59mm in LUL, 50mm in lingula, 56mm in RUL, 67mm in RML and no new or enlarging pulm lesions (stable pulm nodules)...  ~  February 06, 2015:  Initial pulm eval by SN>  Pt referred by DrStoneking for progressive dyspnea and abnormal CT Chest... The patient's granddaughter is DrStoneking's nurse.      Pt presents very concerned about her pulmonary situation having recently had a 3rd CT Chest & was told that they detected more & bigger pulmonary nodules & she is scared that she might have cancer;  she brought a disc w/ CT Chest done 01/26/15 at [facility] & on my review the scan looks different than prev- now w/ widespread patchy ground glass opac superimposed on her prev emphysema changes & mult small pulm nodules (now largely obscured by this interstitial process); I am concerned this new ILD may represent COP (cryptogenic organizing pneumonia- the new terminology for our prev BOOP diagnosis)... She is c/o cough, small amt beige sput w/o hemoptysis, notes SOB/DOE w/ exertion (she used to swim a lot but recently has been more sedentary- esp since knee surg 10/2014); notes SOB w/ housework x36mo & stress makes the SOB worse, been using her Symbicort & Spiriva just "as needed" she says; DrStoneking got her a nebulizer w/ Xopenex to use TID but she has run out of this med...       In the course of our conversations [place] tells me that she was prev diagnosed w/ Lupus by DrGates and DrLevitin years ago (1997) but we do not have any old data from this period (note from  [facility] 02/01/15 just mentioned lung nodules and PMHx has no mention of SLE, prev abn blood tests, prev Rheum eval, etc);  The pt volunteers that she had a pos ANA & was treated by DrLevitin w/ Pred intermittently over a 3 yr period;  ?why she stopped going & it was never mentioned again... She does admit to hx arthritis, hurting all over (esp knees), and she had arthroscopic knee surg by [facility] 10/26/14 "he scraped it out"; she had post op therapy  But she noted her breathing was worse...   Smoking Hx>  she is an ex-smoker, starting at age 69 & smoked for about 50 yrs up to 1.5ppd, & quit in 2003; est ~50-60 pack-yr smoking hx...  Pulmonary Hx>  She reports hx diphtheria as child, had lots of recurrent bronchitic infections in her adult life, she reports several bouts of pneumonia & wonders if these infections left her w/ scar tissue, seen by DrClance 2013 w/ remote hx asthma/ now modCOPD treated w/ Symbicort, Spriva, PPI & antireflux regimen;  ?she had sleep eval 2013 & was started on CP[facility] (?[facility], no [facility] avail)...  Medical Hx>  DrStoneking- HL, Obesity, GERD, Divertics, colon polyps, liver AVM, osteopenia, migraines, RLS, anxiety/depression...  Family Hx>  Hx asthma in her mother, otherw no FamHx lung problems...   Occup Hx>  No known occupational exposures- asbestos, chemical toxins, no recent travel or birds...  Current Meds>  Symbicort160, Spiriva, Xopenex neb, Mucinex, Claritin10, ASA81, Lisinopril5, Fish Oil, Prilosec20-2Bid, Celexa40, calcium/ vitamins... EXAM - Afeb, VSS, O2sat=92% on RA;  Obese at 251#, 5'7"Tall, BMI=39;  HEENT- neg, mallampati2;  Chest- decr BS bilat, few basilar rales, no wheezing or consolidation;  Heart- RR gr1-2 SEM w/o r/g;  Abd- obese soft neg;  Ext- w/o c/c/e  CT Chest 01/26/15 by [facility]- report by Dr. Anabel Halon- indicates extensive bilat pulm fibrosis, mult pulm masses & nodules- neoplasia cannot be excluded, mult nodes in  mediastinum- mostly wnl in size... To my review this shows widespread patchy ground glass opac superimposed on her prev emphysema changes & mult small pulm nodules (now largely obscured by this interstitial process); I am concerned this new ILD may represent COP.   CXR 02/06/15 showed norm heart size, lungs appear hyperinflated w/ coarsened interstitial markings bilat & an opacity at left lung base (all this new compared to last CXR 2012)...  Spirometry 02/06/15 showed FVC=1.97 (62%), FEV1=1.40 (60%), %1sec=71%, mid-flows are  reduced at 49% predicted;  This is suggestive of mixed mild obstructive & mod restrictive dis...  Ambulatory oxygen saturation test 01/2015> O2sat=92% on RA at rest w/ pulse=95/min;  She ambulated in office only 1 lap w/ lowest O2sat=88% w/ pulse=124/min...  LABS 01/2015>  Chems- wnl;  CBC- wnl x WBC=19.5;  TSH=6.41;  Collagen-vasc screen:  ACE=10 (nl<52), RheumFactor=100 (Hi pos >42), Anti-CCP=213 (strong pos >59), ANA pos 1:320 homogeneous pattern, ANCA screen= NEG (MPO & PR-3), Sed=54, CRP=4.5.Marland KitchenMarland Kitchen IMP/PLAN >>       New ILD per CXR & CT Chest- c/w COP/BOOP>  The totality of the eval including labs looks like this could be Rheumatoid lung dis; we will refer her to [facility] ASAP & start Pred rx...      Underlying COPD/emphysema, former smoker>  She is on Symbicort160-2spBid, Spiriva daily, Xopenex nebs Tid, Mucinex and rec to take meds regularly, NOT prn...      Hx mult small pulmonary nodules seen on prev CT scans in 2013>  This will need to be followed w/ serial CXR/ CT scans...      OSA- eval & Rx per [facility], no data avail in Epic>  Per DrStoneking & Maxwell Caul...      GERD/ prob LPR>  She needs a vigorous antireflux regimen w/ PPI Bid, NPO after dinner, elev HOB 6"; DrMJohnson is her GI...      Abn collagen-vasc screen w/ +RA, +Anti-CCP, +ANA, Sed54>  She was prev treated by DrLevitin for Lupus 1997-2000, labs look like RA & we will refer pt to [facility] for Rheum eval  at this time...  ~  March 14, 2015:  16mo ROV w/ SN>        At the time of our initial visit we started PRED40 x2wks=> 20 x2wks w/ f/u appt 71mo;  She saw [facility] for Rheum in the interval but we don't have note from her to review;  Pt tells me that she has Hx OA w/ prev knee arthroscopy by DrCaffrey & presist w/ lots of knee pain, but she claims that while she was taking the Pred20 she suddenly developed severe pain & swelling in her hands, wrists and ankles=> called DrHawkes who wanted to get her off the Pred & weaned her down further to 10mg /d- recall that we started the Pred for prob Rheumatoid Lung w/ CT scan suggesting COP/BOOP pattern;  She further indicates to me that she fell out of bed 2wks ago- bruised leg & arm notes it's hard to walk w/ pain in feet & legs;  She called DStoneking's office & was switched from Aleve to Tylenol;  It got so bad she went to the ER yest=> given Tramadol & told to f/u here;  She has a follow up appt w/ [facility] tomorrow 03/15/15...      From the pulmonary standpoint she is about the same, but the pain in her hands/ wrist has been her main complaint for the last week or so;  She remains on Symbicort160-2spBid, Spiriva daily, NEBS w/ Xopenex tid, Mucinex prn;  She has had trouble doing the inhalers since her hands &wrist swelled up, pain w/ actuation;  She is SOB & O2sat=90% on RA today in office;  Notes sl dry cough, no sput, no hemoptysis, no CP...       EXAM shows Afeb, VSS, O2sat=90% on RA;  HEENT- neg, mallampati2;  Chest- decr BS bilat, few basilar rales, no wheezing or consolidation;  Heart- RR gr1-2 SEM w/o r/g;  Abd- obese soft neg;  Ext- swollen/ tender wrists & hands!  CXR 03/13/15 in ER> mild cardiomeg, COPD/ hyperinflation, prominent lung markings bilat, no acute changes...   EKG 03/13/15 in ER> sinus tachy, rate 106/min, NSSTTWA, NAD...  Ambulatory O2sat test 03/14/15> O2sat=91% on RA at rest w/ pulse=86/min; on standing w/ min walk O2sat dropped to 85%  w/ pulse=106/min... IMP >>      New ILD per CXR & CT Chest- c/w COP/BOOP, prob related to Reumatoid Lung>  We started Pred 40=>20 but this was further cut to 10mg /d by [facility] in the interval; pulm status is clearly no better, ?sl worse, and she needs HOME O2 to be started rec 1-2L/min rest & 2L/min exercise => hypoxemic resp failure...      Underlying COPD/emphysema, former smoker>  She is on Symbicort160-2spBid, Spiriva daily, Xopenex nebs Tid, Mucinex and rec to take meds regularly everyday...      Hx mult small pulmonary nodules seen on prev CT scans in 2015> recent CT 01/2015 showed these nodules were masked by the ground glass opac & interstitial process; this will need to be followed w/ serial CXR/ CT scans...      OSA- eval & Rx per [facility], no data avail in Epic>  Per DrStoneking & Maxwell Caul...      GERD/ prob LPR>  She needs a vigorous antireflux regimen w/ PPI Bid, NPO after dinner, elev HOB 6"; DrMJohnson is her GI...      Abn collagen-vasc screen w/ +RA, +Anti-CCP, +ANA, Sed54> Looks like RA-  She was prev treated by DrLevitin for Lupus 1997-2000;  She was referred to [facility] for Rheum & her note is pending... PLAN >>       She is asked to keep the Pred at 10mg  per day for now due to her pulmonary process;  She has ROV tomorrow w/ Rheum for follow up & to eval her acute hand, wrist, foot arthritic process;  We are starting HOME O2 due to her acute hypoxemic resp failure... We will follow up in 52month, sooner if needed. ADDENDUM>> Pt seen by [facility] 11/30-- seropos RA, OA of both knees, ILD;  Insurance won't cover TNFs therefore started on ARAVA20 and Pred increased to 20mg /d... ADDENDUM>> Full PFTs 03/30/15:  FVC=2.23 (73%), FEV1=1.50 (65%), %1sec=68, mid-flows reduced at 51% predicted; post bronchodil the FEV1 improved 6%; TLC=4.13 (77%), RV=1.80 (75%), RV/TLC=44; DLCO=50% predicted;  These PFTs are compatible w/ mild restriction and a superimposed mild obstructive defect w/ a  mod-severe reduction is DLCO...  ~  May 24, 2015:  5mo ROV w/ SN>  Goddess notes that he breathing is improved w/ her home O2 therapy but she would like an Inogen One POC & we will request this from her DME;  She notes SOB/DOE stable- no progression, and mild dry cough w/o sput, no CP etc... She has remained on Pred per [facility], no recent notes scanned into Epic; on Pred20/d + Arava and she is checked Q29mo w/ slow Pred taper...      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      Underlying COPD/emphysema, former smoker>  on Symbicort160-2spBid, Spiriva daily, Xopenex nebs Tid, Mucinex and rec to take meds regularly everyday...      Hypoxemic resp failure>  On Home O2 at 2L/min w/ O2sat=97% today in office; she is requesting a smaller POC- wants the Inogen One.Marland KitchenMarland Kitchen  Hx mult small pulmonary nodules seen on prev CT scans in 2015> recent CT 01/2015 at [place] ([facility]) showed these nodules were masked by the ground glass opac & interstitial process; CXR 11/16 w/ incr interstitial markings.      OSA- eval & Rx per [facility], no data avail in Epic>  per DrStoneking & Maxwell Caul...      GERD/ prob LPR>  She needs a vigorous antireflux regimen w/ PPI (Prilosec40) Bid, NPO after dinner, elev HOB 6"; DrMJohnson is her GI...      Abn collagen-vasc screen w/ +RA, +Anti-CCP, +ANA, Sed54> Looks like RA-  She was prev treated by DrLevitin for Lupus 1997-2000;  She saw [facility] 02/2015 & started on ARAVA while slowly weaning Pred. EXAM shows Afeb, VSS, O2sat=97% on 2L/min pulse dose;  Wt=267#, 5'7"Tall, BMI=40;  HEENT- neg, mallampati2;  Chest- decr BS bilat, few basilar rales, no wheezing or consolidation;  Heart- RR gr1-2 SEM w/o r/g;  Abd- obese soft neg;  Ext- swollen/ sl tender wrists & hands! IMP/PLAN>>  She is slowly weaning the Pred per Rheum & taking the Arava20;  We will request  a POC device for her;  We plan ROV in 3mo w/ f/u CXR/ CT Chest...  ~  July 24, 2015:  16mo ROV and Illa notes that her breathing is stable> she is an ex-smoker w/ mixed obstructive (COPD/emphysema) & restrictive (ILD w/ prob RA and obesity) lung dis, chronic hypoxemic resp failure on HomeO2;  She has been using her Symbocort160-2spBid, Spiriva daily, Mucinex600- one tab daily; she has not been using her nebulizer and notes that her breathing is good;  She is followed by DrHawkes Q107mo on Arava & slow Pred taper- currently on 10mg /d...      ILD/ pulm fibrosis per CXR & CT Chest- c/w COP/BOOP, prob related to Reumatoid Lung>  On Pred (currently 10mg /d) & Arava per DrHawkes, Rheum w/ freq OVs and slow taper of the Pred...      Underlying COPD/emphysema, former smoker>  on Symbicort160-2spBid, Spiriva daily, Xopenex nebs Tid (only using this prn), Mucinex and rec to take meds regularly everyday...      HEx-induced hypoxemic resp failure>  On Home O2 at 2L/min w/ O2sat=97% on 2L in office today on her new "simply go" POC...       Hx mult small pulmonary nodules seen on prev CT scans in 2015> CT 01/2015 at [place] ([facility]) showed these nodules were masked by the ground glass opac & interstitial process; CXR 11/16 w/ incr interstitial markings.      [facility]A- eval & Rx per [facility], no data avail in Epic>  per DrStoneking & Maxwell Caul...      GERD/ prob LPR>  She needs a vigorous antireflux regimen w/ PPI (Prilosec40) Bid, NPO after dinner, elev HOB 6"; DrMJohnson is her GI...      Abn collagen-vasc screen w/ +RA, +Anti-CCP, +ANA, Sed54> Looks like RA-  She was prev treated by DrLevitin for Lupus 1997-2000;  She saw [facility] 02/2015 & started on ARAVA while slowly weaning Pred. EXAM shows Afeb, VSS, O2sat=97% on 2L/min pulse dose;  Wt=267#, 5'7"Tall, BMI=40;  HEENT- neg, mallampati2;  Chest- decr BS bilat, few basilar rales, no wheezing or consolidation;  Heart- RR gr1-2 SEM w/o r/g;  Abd- obese soft neg;   Ext- much improved wrists & hands  CT Chest => done 07/28/15> norm heart size, atherosclerotic changes in Ao & LAD, mod centrilobular & paraseptal emphysema bilaterally, mult pulmonary nodules are unchanged from 06/2013  scan and felt c/w benign process, NO MENTION MADE OF INTERSTITIAL PROCESS SEEN ON CT CHEST 01/26/15 from [facility] => now largely resolved...    IMP/PLAN>>  [name]'s breathing is stable, at her new baseline & we will proceed w/ new CT Chest to compare to the old; slow Pred taper per rheum and she desperately needs to get on diet & get weight down; continue O2, Symbicort, Spiriva, etc...  ~  January 22, 2016:  48mo ROV w/ SN>  Marcene reports an acute exac of her COPD starting several days ago w/ cough, greenish sput production, mod chest congestion, no hemoptysis, sl incr SOB but no CP/ palpait/ edema; she remains on Home O2 at 1-2L/min, NEBS w/ Xopenex Tid, Symbicort160-2spBid, Spiriva daily; Her Pred was boosted to 12.5mg /d in August by [facility] for an RA flair at that time & she continues on the [place] daily;  We discussed treatment for this AB exac w/ Levaquin + probiotic...       ILD/ pulm fibrosis per CXRs & prev CT Chest- c/w COP/BOOP, prob related to Reumatoid Lung>  On Pred (currently 12.5mg /d per DrHawkes) & Arava, Rheum w/ freq OVs and slow taper of the Pred planned...      Underlying mixed COPD w/ emphysema & chr bronchitis, former smoker>  on Symbicort160-2spBid, Spiriva daily, Xopenex nebs Tid (only using this prn), Mucinex and rec to take meds regularly everyday...      Hx ex-induced hypoxemic resp failure>  On Home O2 at 2L/min w/ O2sat=97% on 2L in office today on her new "simply go" POC...       Hx mult small pulmonary nodules seen on prev CT scans in 2015> CT 01/2015 at [place] ([facility]) showed these nodules were masked by the ground glass opac & interstitial process; CXR 11/16 w/ incr interstitial markings; f/u CT 07/2015 showed stable pulm nodules and resolution of the  interstitial process prev seen...      [facility]A- eval & Rx per [facility], no data avail in Epic>  per DrStoneking & Maxwell Caul...      Obesity>  Anaelle weighs ~270#, 5'7"Tall, BMI=42; she has been counseled on DIET/ EXERCISE/ wt reducing strategies...      GERD/ prob LPR>  She needs a vigorous antireflux regimen w/ PPI (Prilosec40) Bid, NPO after dinner, elev HOB 6"; DrMJohnson is her GI...      Abn collagen-vasc screen w/ +RA, +Anti-CCP, +ANA, Sed54> +RA-  She was prev treated by DrLevitin for Lupus 1997-2000;  She saw [facility] 02/2015 & started on ARAVA while slowly weaning Pred. EXAM shows Afeb, VSS, O2sat=100% on 1L/min pulse dose;  Wt=268#, 5'7"Tall, BMI=40;  HEENT- neg, mallampati2;  Chest- decr BS bilat, few basilar rales, no wheezing or consolidation;  Heart- RR gr1-2 SEM w/o r/g;  Abd- obese soft neg;  Ext- much improved wrists & hands IMP/PLAN>>  I placed Layaan on Levaquin for this bronchitic flair & rec that she take a probiotic like Align while on the antibiotic;  She will continue her O2, Prednisone, Symbicort, Spiriva, & Mucinex;  Reminded about the recommended antireflux regimen;  Also reminded about diet/ exercise & wt reduction strategies...    Past Medical History:  Diagnosis Date  . Allergic rhinitis   . Asthmatic bronchitis   . Colon polyps   . COPD (chronic obstructive pulmonary disease) ([place])   . Diverticulosis   . GERD (gastroesophageal reflux disease)   . H/O: GI bleed   . Hypercholesterolemia >> PCP is DrStoneking   . Migraine headache   .  Obesity   . RLS (restless legs syndrome)    Osteoarthritis    RHEUMATOID ARTHRITIS >> followed by [facility]     Past Surgical History:  Procedure Laterality Date  . TONSILLECTOMY  x2  . TOTAL ABDOMINAL HYSTERECTOMY  02/17/1989    Outpatient Encounter Prescriptions as of 01/22/2016  Medication Sig  . acetaminophen (TYLENOL) 500 MG tablet Take 1,000 mg by mouth every 6 (six) hours as needed for moderate pain.  . Ascorbic Acid  (VITAMIN C) 1000 MG tablet Take 1,000 mg by mouth daily.  Marland Kitchen aspirin 81 MG tablet Take 81 mg by mouth every other day.  Marland Kitchen atorvastatin (LIPITOR) 10 MG tablet Take 10 mg by mouth daily.  . budesonide-formoterol (SYMBICORT) 160-4.5 MCG/ACT inhaler Inhale 2 puffs into the lungs 2 (two) times daily.   . Calcium Carbonate-Vitamin D (CALCIUM + D) 600-200 MG-UNIT TABS Take 1 tablet by mouth daily.    . citalopram (CELEXA) 40 MG tablet Take 40 mg by mouth daily.  Marland Kitchen guaiFENesin (MUCINEX) 600 MG 12 hr tablet Take 600 mg by mouth daily.   . Leflunomide (ARAVA PO) Take by mouth daily.  Marland Kitchen levalbuterol (XOPENEX) 1.25 MG/3ML nebulizer solution Take 1.25 mg by nebulization 3 (three) times daily. (Patient taking differently: Take 1.25 mg by nebulization 3 (three) times daily as needed. )  . lisinopril (PRINIVIL,ZESTRIL) 5 MG tablet Take 5 mg by mouth daily.   Marland Kitchen loratadine (CLARITIN) 10 MG tablet Take 10 mg by mouth daily.    . Omega-3 Fatty Acids (FISH OIL) 1200 MG CAPS Take 1 capsule by mouth daily.    Marland Kitchen omeprazole (PRILOSEC) 40 MG capsule Take 1 capsule by mouth 2 (two) times daily.  . OXYGEN Inhale into the lungs. 2 lpm with rest and exertion  . predniSONE (DELTASONE) 10 MG tablet Take 10 mg by mouth daily with breakfast.   . tiotropium (SPIRIVA) 18 MCG inhalation capsule Place 1 capsule (18 mcg total) into inhaler and inhale daily.  . traMADol (ULTRAM) 50 MG tablet Take 1 tablet (50 mg total) by mouth every 6 (six) hours as needed.  . valACYclovir (VALTREX) 1000 MG tablet Take 1,000 mg by mouth daily as needed (for breakouts).   . budesonide-formoterol (SYMBICORT) 160-4.5 MCG/ACT inhaler Inhale 2 puffs into the lungs 2 (two) times daily.  Marland Kitchen levofloxacin (LEVAQUIN) 500 MG tablet Take 1 tablet (500 mg total) by mouth daily.  . Tiotropium Bromide Monohydrate (SPIRIVA RESPIMAT) 1.25 MCG/ACT AERS Inhale 2 puffs into the lungs daily.   No facility-administered encounter medications on file as of 01/22/2016.      Allergies  Allergen Reactions  . Macrolides And Ketolides   . Penicillins     Facial numbness  . Sertraline Hcl   . Shellfish Allergy   . Sulfa Antibiotics     Immunization History  Administered Date(s) Administered  . Influenza, High Dose Seasonal PF 01/17/2016  . Influenza-Unspecified 12/30/2014  . Pneumococcal Conjugate-13 07/07/2013  . Pneumococcal Polysaccharide-23 03/03/2009    Current Medications, Allergies, Past Medical History, Past Surgical History, Family History, and Social History were reviewed in [employer] record.   Review of Systems             All symptoms NEG except where BOLDED >>  Constitutional:  F/C/S, fatigue, anorexia, unexpected weight change. HEENT:  HA, visual changes, hearing loss, earache, nasal symptoms, sore throat, mouth sores, hoarseness. Resp:  cough, sputum, hemoptysis; SOB, tightness, wheezing. Cardio:  CP, palpit, DOE, orthopnea, edema. GI:  N/V/D/C, blood  in stool; reflux, abd pain, distention, gas. GU:  dysuria, freq, urgency, hematuria, flank pain, voiding difficulty. MS:  joint pain, swelling, tenderness, decr ROM; neck pain, back pain, etc. Neuro:  HA, tremors, seizures, dizziness, syncope, weakness, numbness, gait abn. Skin:  suspicious lesions or skin rash. Heme:  adenopathy, bruising, bleeding. Psyche:  confusion, agitation, sleep disturbance, hallucinations, anxiety, depression suicidal.   Objective:   Physical Exam       Vital Signs:  Reviewed...   General:  WD, obese, 75 y/o WF in NAD; alert & oriented; pleasant & cooperative... HEENT:  [place]/AT; Conjunctiva- pink, Sclera- nonicteric, EOM-wnl, PERRLA, EACs-clear, TMs-wnl; NOSE-clear; THROAT-clear & wnl.  Neck:  Supple w/ fair ROM; no JVD; normal carotid impulses w/o bruits; no thyromegaly or nodules palpated; no lymphadenopathy.  Chest:  Decr BS bilat, few basilar rales, no wheezing rhonchi or signs of consolidation... Heart:  Regular Rhythm; norm  S1 & S2 gr1-2/6 SEM, w/o rubs or gallops detected. Abdomen:  Soft & nontender- no guarding or rebound; normal bowel sounds; no organomegaly or masses palpated. Ext:  decrROM; without deformities +arthritic changes; no varicose veins, +venous insuffic, tr edema;  Pulses intact w/o bruits. Neuro:  No focal neuro deficits; sensory testing normal; gait OK & balance OK. Derm:  No lesions noted; no rash etc. Lymph:  No cervical, supraclavicular, axillary, or inguinal adenopathy palpated.   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      Underlying COPD/emphysema, former smoker>  on Symbicort160-2spBid, Spiriva daily, Xopenex nebs Tid (only using this prn), Mucinex and rec to take meds regularly everyday...      Hypoxemic resp failure>  On Home O2 at 2L/min w/ O2sat=97% on 2L in office today on her new "simply go" POC...       Hx mult small pulmonary nodules seen on prev CT scans in 2015> CT 01/2015 at [place] ([facility]) showed these nodules were masked by the ground glass opac & interstitial process; CXR 11/16 w/ incr interstitial markings.      OSA- eval & Rx per [facility], no data avail in Epic>  per DrStoneking & Maxwell Caul...      GERD/ prob LPR>  She needs a vigorous antireflux regimen w/ PPI (Prilosec40) Bid, NPO after dinner, elev HOB 6"; DrMJohnson is her GI...      Abn collagen-vasc screen w/ +RA, +Anti-CCP, +ANA, Sed54> Looks like RA-  She was prev treated by DrLevitin for Lupus 1997-2000;  She saw [facility] 02/2015 & started on ARAVA while slowly weaning Pred.  PLAN >>  02/10/15:  Start Pred 20mg  tabs- 40mg  Qam for 2 wks, then 20mg /d til return;  Refer to [place], ASAP;  Continue other meds reglarly (Symbicort, Spiriva, Xopenex, Mucinex);  Needs vigorous antireflux regimen... 03/14/15:  She is asked to keep  the Pred at 10mg  per day for now due to her pulmonary process;  She has ROV tomorrow w/ Rheum for follow up & to eval her acute hand, wrist, foot arthritic process;  We are starting HOME O2 due to her acute hypoxemic resp failure...  05/24/15:   She has established w/ DrHawkes for Rheum on slow Pred taper + Arava;  Breathing is stable on O2, Pred, XopenexTid, Symbicort160, Spiriva, and Mucinex... 07/24/15>   Judee's breathing is stable, at  her new baseline & we will proceed w/ new CT Chest to compare to the old; slow Pred taper per rheum and she desperately needs to get on diet & get weight down; continue O2, Symbicort, Spiriva, etc... 01/22/16>   I placed [place] on Levaquin for this bronchitic flair & rec that she take a probiotic like Align while on the antibiotic;  She will continue her O2, Prednisone, Symbicort, Spiriva, & Mucinex;  Reminded about the recommended antireflux regimen;  Also reminded about diet/ exercise & wt reduction strategies     Plan:                                      HOME O2 at 1-2L/min at rest and 2L/min w/ activity... Patient's Medications  New Prescriptions   BUDESONIDE-FORMOTEROL (SYMBICORT) 160-4.5 MCG/ACT INHALER    Inhale 2 puffs into the lungs 2 (two) times daily.   LEVOFLOXACIN (LEVAQUIN) 500 MG TABLET    Take 1 tablet (500 mg total) by mouth daily.   TIOTROPIUM BROMIDE MONOHYDRATE (SPIRIVA RESPIMAT) 1.25 MCG/ACT AERS    Inhale 2 puffs into the lungs daily.  Previous Medications   ACETAMINOPHEN (TYLENOL) 500 MG TABLET    Take 1,000 mg by mouth every 6 (six) hours as needed for moderate pain.   ASCORBIC ACID (VITAMIN C) 1000 MG TABLET    Take 1,000 mg by mouth daily.   ASPIRIN 81 MG TABLET    Take 81 mg by mouth every other day.   ATORVASTATIN (LIPITOR) 10 MG TABLET    Take 10 mg by mouth daily.   BUDESONIDE-FORMOTEROL (SYMBICORT) 160-4.5 MCG/ACT INHALER    Inhale 2 puffs into the lungs 2 (two) times daily.    CALCIUM CARBONATE-VITAMIN D (CALCIUM + D) 600-200 MG-UNIT TABS     Take 1 tablet by mouth daily.     CITALOPRAM (CELEXA) 40 MG TABLET    Take 40 mg by mouth daily.   GUAIFENESIN (MUCINEX) 600 MG 12 HR TABLET    Take 600 mg by mouth daily.    LEFLUNOMIDE (ARAVA PO)    Take by mouth daily.   LEVALBUTEROL (XOPENEX) 1.25 MG/3ML NEBULIZER SOLUTION    Take 1.25 mg by nebulization 3 (three) times daily.   LISINOPRIL (PRINIVIL,ZESTRIL) 5 MG TABLET    Take 5 mg by mouth daily.    LORATADINE (CLARITIN) 10 MG TABLET    Take 10 mg by mouth daily.     OMEGA-3 FATTY ACIDS (FISH OIL) 1200 MG CAPS    Take 1 capsule by mouth daily.     OMEPRAZOLE (PRILOSEC) 40 MG CAPSULE    Take 1 capsule by mouth 2 (two) times daily.   OXYGEN    Inhale into the lungs. 2 lpm with rest and exertion   PREDNISONE (DELTASONE) 10 MG TABLET    Take 10 mg by mouth daily with breakfast.    TIOTROPIUM (SPIRIVA) 18 MCG INHALATION CAPSULE    Place 1 capsule (18 mcg total) into inhaler and inhale daily.   TRAMADOL (ULTRAM) 50 MG TABLET    Take 1 tablet (50 mg total) by mouth every 6 (six) hours as needed.   VALACYCLOVIR (VALTREX) 1000 MG TABLET    Take 1,000 mg by mouth daily as needed (for breakouts).   Modified Medications   No medications on file  Discontinued Medications   No medications on file

## 2016-01-22 NOTE — Patient Instructions (Signed)
Today we updated your med list in our EPIC system...    Continue your current medications the same...  Continue your Symbicort & Spiriva as you are doing!  Continue the Prednisone as directed by [facility]...  We wrote from an antibiotic> LEVAQUIN 500mg  one tab daily x 7d...   Be sure to take your probiotic once daily as well...  Call for any questions...  Let's plan a follow up visit in 86mo sooner if needed for breathing problems.Marland KitchenMarland Kitchen

## 2016-01-23 DIAGNOSIS — M0579 Rheumatoid arthritis with rheumatoid factor of multiple sites without organ or systems involvement: Secondary | ICD-10-CM | POA: Diagnosis not present

## 2016-01-23 DIAGNOSIS — M170 Bilateral primary osteoarthritis of knee: Secondary | ICD-10-CM | POA: Diagnosis not present

## 2016-01-23 DIAGNOSIS — Z79899 Other long term (current) drug therapy: Secondary | ICD-10-CM | POA: Diagnosis not present

## 2016-01-23 DIAGNOSIS — J849 Interstitial pulmonary disease, unspecified: Secondary | ICD-10-CM | POA: Diagnosis not present

## 2016-01-24 ENCOUNTER — Telehealth (HOSPITAL_COMMUNITY): Payer: Self-pay

## 2016-01-24 ENCOUNTER — Ambulatory Visit (HOSPITAL_COMMUNITY): Payer: PPO | Admitting: Physical Therapy

## 2016-01-24 NOTE — Telephone Encounter (Signed)
01/24/16 she left a message saying she was sick with congestion this morning

## 2016-01-24 NOTE — Telephone Encounter (Signed)
10/11 she called to say she wouldn't be here.... Due to congestion

## 2016-01-25 ENCOUNTER — Ambulatory Visit (HOSPITAL_COMMUNITY): Payer: PPO | Admitting: Physical Therapy

## 2016-01-30 ENCOUNTER — Ambulatory Visit (HOSPITAL_COMMUNITY): Payer: PPO | Admitting: Physical Therapy

## 2016-01-30 DIAGNOSIS — M5416 Radiculopathy, lumbar region: Secondary | ICD-10-CM

## 2016-01-30 DIAGNOSIS — R262 Difficulty in walking, not elsewhere classified: Secondary | ICD-10-CM | POA: Diagnosis not present

## 2016-01-30 DIAGNOSIS — M6281 Muscle weakness (generalized): Secondary | ICD-10-CM

## 2016-01-30 NOTE — Therapy (Signed)
[facility] [address] [place], [place], [address] Phone: [phone]   Fax:  [phone]  Physical Therapy Treatment  Patient Details  Name: [name] MRN: [id number] Date of Birth: 06-27-1940 Referring Provider: Suella Broad  Encounter Date: 01/30/2016      PT End of Session - 01/30/16 1010    Visit Number 6   Number of Visits 8   Date for PT Re-Evaluation 02/02/16   Authorization Type [employer]   Authorization - Visit Number 6   Authorization - Number of Visits 8   PT Start Time [phone]   PT Stop Time 1035   PT Time Calculation (min) 42 min   Equipment Utilized During Treatment Oxygen   Activity Tolerance Patient tolerated treatment well   Behavior During Therapy [place] for tasks assessed/performed      Past Medical History:  Diagnosis Date  . Allergic rhinitis   . Asthma   . Asthmatic bronchitis   . Colon polyps   . COPD (chronic obstructive pulmonary disease) ([place])   . Diverticulosis   . GERD (gastroesophageal reflux disease)   . H/O: GI bleed   . Hypercholesterolemia   . Migraine headache   . Obesity   . RLS (restless legs syndrome)     Past Surgical History:  Procedure Laterality Date  . TONSILLECTOMY  x2  . TOTAL ABDOMINAL HYSTERECTOMY  02/17/1989    There were no vitals filed for this visit.      Subjective Assessment - 01/30/16 0958    Subjective PT states that she was sick last week with bronchial inflammation.  Pt has finished her antibiotics and feels better.    Pertinent History RA, COPD, HTN, osteoporosis, Rt knee arthroscopic surgery    How long can you sit comfortably? Pt states that after she has been sitting in her lift chair for a while her legs will bother her so much that she needs to get up.  Usually about an hour.    How long can you stand comfortably? 5 minutes    How long can you walk comfortably? limited due to O2;    Diagnostic tests none   Patient Stated Goals to be able  to fold clothes without increased pain, To be able to sit through a movie, decreased pain, dance and plant flowers    Currently in Pain? No/denies                         [facility] Adult PT Treatment/Exercise - 01/30/16 0001      Lumbar Exercises: Stretches   Active Hamstring Stretch 2 reps;30 seconds     Lumbar Exercises: Standing   Other Standing Lumbar Exercises tandem stance 30" x 2 ; side step x 2 RT      Lumbar Exercises: Seated   Sit to Stand 10 reps   Sit to Stand Limitations w back x 10; x to v   Other Seated Lumbar Exercises push with feet pull head to ceiling.       Lumbar Exercises: Supine   Ab Set 10 reps   Dead Bug Limitations x10    Bridge 10 reps   Bridge Limitations hold for 10 seconds    Other Supine Lumbar Exercises decompression with t-band      Lumbar Exercises: Sidelying   Hip Abduction 10 reps                  PT Short Term Goals -  01/15/16 1021      PT SHORT TERM GOAL #1   Title Pt back and hip pain to be no greater than a 5/10 to alow pt to be able to stand for 10 minutes to fold laundry    Time 2   Period Weeks   Status Achieved     PT SHORT TERM GOAL #2   Title Pt to verbalize and demonstrate proper body mechanics to allow her to plant flowers without increased back or hip pain    Time 2   Period Weeks   Status Achieved     PT SHORT TERM GOAL #3   Title Pt to be walking for 10 minutes at a time to improve self back care.    Time 2   Period Weeks   Status On-going           PT Long Term Goals - 01/15/16 1024      PT LONG TERM GOAL #1   Title Pt back and lt  hip pain to be no greater than a 2/10 to allow pt to be able to go dancing again without increased pain    Time 4   Period Weeks   Status On-going     PT LONG TERM GOAL #2   Title Pt to be walking for 20 minutes at at time to establish self care for back and activity tolerance   Time 4   Period Weeks   Status On-going     PT LONG TERM GOAL #3    Title Pt to be able to stand for 20 mintues without increased back pain to be able to make a meal without sitting down.    Time 4   Period Weeks   Status On-going               Plan - 01/30/16 1011    Clinical Impression Statement Pt has been ill for the past week so treatment remained concentrating on postural exercises by adding t-band decompression exercises.  Pt reviewed bridge with instruction to hold for 10 seconds and hip abduction with longer holds    Rehab Potential Good   PT Frequency 2x / week   PT Duration 4 weeks   PT Treatment/Interventions ADLs/Self Care Home Management;Functional mobility training;Therapeutic activities;Therapeutic exercise;Balance training;Patient/family education;Manual techniques   PT Next Visit Plan Progress proximal musculature strengthening and functional strengthening    PT Home Exercise Plan given for sitting abdominal isometric, scapular retraction and glut sets ; decompression 1-5 and body mechanic sheet. bent knee lift and arm raise. ; 10/03 addition with bridges; decompression exercises with t-band       Patient will benefit from skilled therapeutic intervention in order to improve the following deficits and impairments:  Cardiopulmonary status limiting activity, Decreased activity tolerance, Decreased balance, Decreased strength, Difficulty walking, Postural dysfunction, Pain  Visit Diagnosis: Difficulty in walking, not elsewhere classified  Muscle weakness (generalized)  Radiculopathy, lumbar region     Problem List Patient Active Problem List   Diagnosis Date Noted  . COPD mixed type ([place]) 03/14/2015  . Rheumatoid arthritis ([place]) 03/14/2015  . Dyspnea 02/06/2015  . ILD (interstitial lung disease) ([place]) 02/06/2015  . Cough 05/17/2011   Rayetta Humphrey, PT CLT [phone] 01/30/2016, 12:17 PM  [facility] [address] [place], [place], [address] Phone: [phone]   Fax:   [phone]  Name: [name] MRN: [id number] Date of Birth: Aug 09, 1940

## 2016-01-31 ENCOUNTER — Ambulatory Visit (HOSPITAL_COMMUNITY): Payer: PPO | Admitting: Physical Therapy

## 2016-01-31 DIAGNOSIS — R262 Difficulty in walking, not elsewhere classified: Secondary | ICD-10-CM | POA: Diagnosis not present

## 2016-01-31 DIAGNOSIS — M6281 Muscle weakness (generalized): Secondary | ICD-10-CM

## 2016-01-31 DIAGNOSIS — M5416 Radiculopathy, lumbar region: Secondary | ICD-10-CM

## 2016-01-31 NOTE — Therapy (Signed)
[facility] [address] [place], [place], [address] Phone: [phone]   Fax:  [phone]  Physical Therapy Treatment  Patient Details  Name: [name] MRN: [id number] Date of Birth: 01-25-1941 Referring Provider: Suella Broad   Encounter Date: 01/31/2016      PT End of Session - 01/31/16 1237    Visit Number 7   Number of Visits 8   PT Start Time 0950   PT Stop Time 1025   PT Time Calculation (min) 35 min   Activity Tolerance Patient tolerated treatment well   Behavior During Therapy [facility] for tasks assessed/performed      Past Medical History:  Diagnosis Date  . Allergic rhinitis   . Asthma   . Asthmatic bronchitis   . Colon polyps   . COPD (chronic obstructive pulmonary disease) ([place])   . Diverticulosis   . GERD (gastroesophageal reflux disease)   . H/O: GI bleed   . Hypercholesterolemia   . Migraine headache   . Obesity   . RLS (restless legs syndrome)     Past Surgical History:  Procedure Laterality Date  . TONSILLECTOMY  x2  . TOTAL ABDOMINAL HYSTERECTOMY  02/17/1989    There were no vitals filed for this visit.      Subjective Assessment - 01/31/16 0957    Subjective Pt states her Lt knee is bothering her but her back and hip feel fine.    Pertinent History RA, COPD, HTN, osteoporosis, Rt knee arthroscopic surgery    How long can you sit comfortably? Pt has difficulty going from sit to stand if she has been sitting too long    How long can you stand comfortably? Able to stand for up to 30 minutes was five minutes    How long can you walk comfortably? limited due to O2;    Diagnostic tests none   Patient Stated Goals to be able to fold clothes without increased pain, To be able to sit through a movie, decreased pain, dance and plant flowers    Currently in Pain? Yes  this is not what we are seeing her for   Pain Score 5    Pain Location Knee   Pain Orientation Left   Pain Descriptors / Indicators  Aching   Pain Type Chronic pain   Pain Onset In the past 7 days   Pain Frequency Intermittent   Aggravating Factors  weight bearing    Pain Relieving Factors ice    Effect of Pain on Daily Activities increases            [facility] PT Assessment - 01/31/16 0001      Assessment   Medical Diagnosis Low back pain    Referring Provider Suella Broad    Onset Date/Surgical Date 09/28/15   Next MD Visit none schedule   Prior Therapy none      Precautions   Precautions None     Restrictions   Weight Bearing Restrictions No     Home Environment   Living Environment Private residence   Type of [place] entrance     Prior Function   Level of Independence Independent     Cognition   Overall Cognitive Status Within Functional Limits for tasks assessed     Observation/Other Assessments   Focus on Therapeutic Outcomes (FOTO)  69     Functional Tests   Functional tests Single leg stance;Sit to Stand     Single  Leg Stance   Comments Lt10 seconds was  20 seconds; Rt 56 was 20 seconds      Sit to Stand   Comments 5 x in 20.66 with O2      Posture/Postural Control   Posture/Postural Control Postural limitations   Postural Limitations Rounded Shoulders;Decreased lumbar lordosis;Increased thoracic kyphosis   Posture Comments protruding abdominal      AROM   Lumbar Flexion fingertips touch floor were  3 inches from floor    Lumbar Extension 23  was 19      Strength   Right Hip Flexion (P)  5/5  was 4/5    Right Hip Extension (P)  5/5  was 3/5   Right Hip ABduction 5/5   Left Hip Flexion (P)  5/5  ws 4/5    Left Hip Extension (P)  4+/5  was a 3/5   Right Knee Flexion 5/5   Right Knee Extension 5/5   Left Knee Flexion (P)  5/5  was 4+/5   Left Knee Extension 5/5   Right Ankle Dorsiflexion 5/5   Right Ankle Plantar Flexion 5/5   Left Ankle Dorsiflexion 5/5   Left Ankle Plantar Flexion 5/5                                PT Short Term Goals - 01/31/16 1012      PT SHORT TERM GOAL #1   Title Pt back and hip pain to be no greater than a 5/10 to alow pt to be able to stand for 10 minutes to fold laundry    Time 2   Period Weeks   Status Achieved     PT SHORT TERM GOAL #2   Title Pt to verbalize and demonstrate proper body mechanics to allow her to plant flowers without increased back or hip pain    Time 2   Period Weeks   Status Achieved     PT SHORT TERM GOAL #3   Title Pt to be walking for 10 minutes at a time to improve self back care.    Time 2   Period Weeks   Status Achieved           PT Long Term Goals - 01/31/16 1013      PT LONG TERM GOAL #1   Title Pt back and lt  hip pain to be no greater than a 2/10 to allow pt to be able to go dancing again without increased pain    Time 4   Period Weeks   Status Achieved     PT LONG TERM GOAL #2   Title Pt to be walking for 20 minutes at at time to establish self care for back and activity tolerance   Time 4   Period Weeks   Status On-going     PT LONG TERM GOAL #3   Title Pt to be able to stand for 20 mintues without increased back pain to be able to make a meal without sitting down.    Time 4   Period Weeks   Status Achieved               Plan - 01/31/16 1237    Clinical Impression Statement Pt reassessed and feels that she is ready for discharge. PT has planted 38 flowers and gone dancing without back or hip pain.   Pt encouraged to keep up breathing exercises/walking and to begin going  to the [facility].   Rehab Potential Good   PT Frequency 2x / week   PT Duration 4 weeks   PT Treatment/Interventions ADLs/Self Care Home Management;Functional mobility training;Therapeutic activities;Therapeutic exercise;Balance training;Patient/family education;Manual techniques   PT Next Visit Plan Discharge majority of goals have been met.      PT Home Exercise Plan given for sitting abdominal isometric, scapular retraction and glut sets ;  decompression 1-5 and body mechanic sheet. bent knee lift and arm raise. ; 10/03 addition with bridges; decompression exercises with t-band       Patient will benefit from skilled therapeutic intervention in order to improve the following deficits and impairments:  Cardiopulmonary status limiting activity, Decreased activity tolerance, Decreased balance, Decreased strength, Difficulty walking, Postural dysfunction, Pain  Visit Diagnosis: Difficulty in walking, not elsewhere classified  Muscle weakness (generalized)  Radiculopathy, lumbar region       G-Codes - 27-Feb-2016 1239    Functional Assessment Tool Used report of self walking    Functional Limitation Mobility: Walking and moving around   Mobility: Walking and Moving Around Goal Status [id number]) At least 1 percent but less than 20 percent impaired, limited or restricted   Mobility: Walking and Moving Around Discharge Status [phone]) At least 1 percent but less than 20 percent impaired, limited or restricted      Problem List Patient Active Problem List   Diagnosis Date Noted  . COPD mixed type ([place]) 03/14/2015  . Rheumatoid arthritis ([place]) 03/14/2015  . Dyspnea 02/06/2015  . ILD (interstitial lung disease) ([place]) 02/06/2015  . Cough 05/17/2011    Rayetta Humphrey, PT CLT [phone] 27-Feb-2016, 12:40 PM  [facility] [place], [place], [address] Phone: [phone]   Fax:  [phone]  Name: JAKEISHA STRICKER MRN: [id number] Date of Birth: [birth year]  PHYSICAL THERAPY DISCHARGE SUMMARY  Visits from Start of Care: 7  Current functional level related to goals / functional outcomes: See above   Remaining deficits: See above   Education / Equipment: Posture, body mechanics  Plan: Patient agrees to discharge.  Patient goals were met. Patient is being discharged due to being pleased with the current functional level.  ?????        Rayetta Humphrey, [place]  CLT [phone]

## 2016-02-06 DIAGNOSIS — N3944 Nocturnal enuresis: Secondary | ICD-10-CM | POA: Diagnosis not present

## 2016-02-06 DIAGNOSIS — N3946 Mixed incontinence: Secondary | ICD-10-CM | POA: Diagnosis not present

## 2016-02-06 DIAGNOSIS — R350 Frequency of micturition: Secondary | ICD-10-CM | POA: Diagnosis not present

## 2016-02-12 DIAGNOSIS — J439 Emphysema, unspecified: Secondary | ICD-10-CM | POA: Diagnosis not present

## 2016-02-12 DIAGNOSIS — J849 Interstitial pulmonary disease, unspecified: Secondary | ICD-10-CM | POA: Diagnosis not present

## 2016-02-12 DIAGNOSIS — M05741 Rheumatoid arthritis with rheumatoid factor of right hand without organ or systems involvement: Secondary | ICD-10-CM | POA: Diagnosis not present

## 2016-02-12 DIAGNOSIS — G4733 Obstructive sleep apnea (adult) (pediatric): Secondary | ICD-10-CM | POA: Diagnosis not present

## 2016-02-12 DIAGNOSIS — J449 Chronic obstructive pulmonary disease, unspecified: Secondary | ICD-10-CM | POA: Diagnosis not present

## 2016-02-12 DIAGNOSIS — R0600 Dyspnea, unspecified: Secondary | ICD-10-CM | POA: Diagnosis not present

## 2016-02-12 DIAGNOSIS — M05742 Rheumatoid arthritis with rheumatoid factor of left hand without organ or systems involvement: Secondary | ICD-10-CM | POA: Diagnosis not present

## 2016-03-06 ENCOUNTER — Ambulatory Visit
Admission: RE | Admit: 2016-03-06 | Discharge: 2016-03-06 | Disposition: A | Discharge status: Home / Self Care | Payer: PPO | Source: Ambulatory Visit | Attending: Geriatric Medicine | Admitting: Geriatric Medicine

## 2016-03-06 DIAGNOSIS — Z1231 Encounter for screening mammogram for malignant neoplasm of breast: Secondary | ICD-10-CM

## 2016-03-14 DIAGNOSIS — R0600 Dyspnea, unspecified: Secondary | ICD-10-CM | POA: Diagnosis not present

## 2016-03-14 DIAGNOSIS — J849 Interstitial pulmonary disease, unspecified: Secondary | ICD-10-CM | POA: Diagnosis not present

## 2016-03-14 DIAGNOSIS — J449 Chronic obstructive pulmonary disease, unspecified: Secondary | ICD-10-CM | POA: Diagnosis not present

## 2016-03-14 DIAGNOSIS — M05741 Rheumatoid arthritis with rheumatoid factor of right hand without organ or systems involvement: Secondary | ICD-10-CM | POA: Diagnosis not present

## 2016-03-14 DIAGNOSIS — M05742 Rheumatoid arthritis with rheumatoid factor of left hand without organ or systems involvement: Secondary | ICD-10-CM | POA: Diagnosis not present

## 2016-03-14 DIAGNOSIS — J439 Emphysema, unspecified: Secondary | ICD-10-CM | POA: Diagnosis not present

## 2016-03-14 DIAGNOSIS — G4733 Obstructive sleep apnea (adult) (pediatric): Secondary | ICD-10-CM | POA: Diagnosis not present

## 2016-03-26 DIAGNOSIS — Z6841 Body Mass Index (BMI) 40.0 and over, adult: Secondary | ICD-10-CM | POA: Diagnosis not present

## 2016-03-26 DIAGNOSIS — M0579 Rheumatoid arthritis with rheumatoid factor of multiple sites without organ or systems involvement: Secondary | ICD-10-CM | POA: Diagnosis not present

## 2016-03-26 DIAGNOSIS — J849 Interstitial pulmonary disease, unspecified: Secondary | ICD-10-CM | POA: Diagnosis not present

## 2016-03-26 DIAGNOSIS — Z79899 Other long term (current) drug therapy: Secondary | ICD-10-CM | POA: Diagnosis not present

## 2016-03-26 DIAGNOSIS — M170 Bilateral primary osteoarthritis of knee: Secondary | ICD-10-CM | POA: Diagnosis not present

## 2016-04-01 DIAGNOSIS — N3946 Mixed incontinence: Secondary | ICD-10-CM | POA: Diagnosis not present

## 2016-04-01 DIAGNOSIS — N3944 Nocturnal enuresis: Secondary | ICD-10-CM | POA: Diagnosis not present

## 2016-04-13 DIAGNOSIS — J449 Chronic obstructive pulmonary disease, unspecified: Secondary | ICD-10-CM | POA: Diagnosis not present

## 2016-04-13 DIAGNOSIS — J439 Emphysema, unspecified: Secondary | ICD-10-CM | POA: Diagnosis not present

## 2016-04-13 DIAGNOSIS — M05741 Rheumatoid arthritis with rheumatoid factor of right hand without organ or systems involvement: Secondary | ICD-10-CM | POA: Diagnosis not present

## 2016-04-13 DIAGNOSIS — R0600 Dyspnea, unspecified: Secondary | ICD-10-CM | POA: Diagnosis not present

## 2016-04-13 DIAGNOSIS — J849 Interstitial pulmonary disease, unspecified: Secondary | ICD-10-CM | POA: Diagnosis not present

## 2016-04-13 DIAGNOSIS — M05742 Rheumatoid arthritis with rheumatoid factor of left hand without organ or systems involvement: Secondary | ICD-10-CM | POA: Diagnosis not present

## 2016-04-13 DIAGNOSIS — G4733 Obstructive sleep apnea (adult) (pediatric): Secondary | ICD-10-CM | POA: Diagnosis not present

## 2016-04-24 DIAGNOSIS — E7800 Pure hypercholesterolemia, unspecified: Secondary | ICD-10-CM | POA: Diagnosis not present

## 2016-04-24 DIAGNOSIS — Z79899 Other long term (current) drug therapy: Secondary | ICD-10-CM | POA: Diagnosis not present

## 2016-05-09 DIAGNOSIS — N3946 Mixed incontinence: Secondary | ICD-10-CM | POA: Diagnosis not present

## 2016-05-09 DIAGNOSIS — N3944 Nocturnal enuresis: Secondary | ICD-10-CM | POA: Diagnosis not present

## 2016-05-14 DIAGNOSIS — J849 Interstitial pulmonary disease, unspecified: Secondary | ICD-10-CM | POA: Diagnosis not present

## 2016-05-14 DIAGNOSIS — G4733 Obstructive sleep apnea (adult) (pediatric): Secondary | ICD-10-CM | POA: Diagnosis not present

## 2016-05-14 DIAGNOSIS — M05741 Rheumatoid arthritis with rheumatoid factor of right hand without organ or systems involvement: Secondary | ICD-10-CM | POA: Diagnosis not present

## 2016-05-14 DIAGNOSIS — J449 Chronic obstructive pulmonary disease, unspecified: Secondary | ICD-10-CM | POA: Diagnosis not present

## 2016-05-14 DIAGNOSIS — J439 Emphysema, unspecified: Secondary | ICD-10-CM | POA: Diagnosis not present

## 2016-05-14 DIAGNOSIS — M05742 Rheumatoid arthritis with rheumatoid factor of left hand without organ or systems involvement: Secondary | ICD-10-CM | POA: Diagnosis not present

## 2016-05-14 DIAGNOSIS — R0600 Dyspnea, unspecified: Secondary | ICD-10-CM | POA: Diagnosis not present

## 2016-06-12 DIAGNOSIS — R0603 Acute respiratory distress: Secondary | ICD-10-CM | POA: Diagnosis not present

## 2016-06-12 DIAGNOSIS — M05741 Rheumatoid arthritis with rheumatoid factor of right hand without organ or systems involvement: Secondary | ICD-10-CM | POA: Diagnosis not present

## 2016-06-12 DIAGNOSIS — G4733 Obstructive sleep apnea (adult) (pediatric): Secondary | ICD-10-CM | POA: Diagnosis not present

## 2016-06-12 DIAGNOSIS — J849 Interstitial pulmonary disease, unspecified: Secondary | ICD-10-CM | POA: Diagnosis not present

## 2016-06-12 DIAGNOSIS — J439 Emphysema, unspecified: Secondary | ICD-10-CM | POA: Diagnosis not present

## 2016-06-12 DIAGNOSIS — J449 Chronic obstructive pulmonary disease, unspecified: Secondary | ICD-10-CM | POA: Diagnosis not present

## 2016-06-12 DIAGNOSIS — M05742 Rheumatoid arthritis with rheumatoid factor of left hand without organ or systems involvement: Secondary | ICD-10-CM | POA: Diagnosis not present

## 2016-06-14 ENCOUNTER — Telehealth: Payer: Self-pay | Admitting: Pulmonary Disease

## 2016-06-14 ENCOUNTER — Other Ambulatory Visit: Payer: Self-pay | Admitting: Pulmonary Disease

## 2016-06-14 MED ORDER — LEVOFLOXACIN 500 MG PO TABS: 500.0000 mg | ORAL_TABLET | Freq: Every day | ORAL | 0 refills | Status: DC

## 2016-06-14 NOTE — Telephone Encounter (Signed)
Per SN >> Levaquin 500mg  #7 1 po daily.  Spoke with pt. She is aware that we will be sending in this prescription. Nothing further was needed.

## 2016-06-14 NOTE — Telephone Encounter (Signed)
Spoke with pt. Reports coughing. Cough is producing thick, green mucus. Denies chest tightness, wheezing, SOB or fever. States this cough started yesterday. Also stated, "I know my body and I need to nip this in bud before it gets any worse." Pt is requesting a refill on Levaquin 500mg . She reported that SN gives this to her to keep on hand for occassions like this.  SN - please advise. Thanks.  Allergies  Allergen Reactions  . Macrolides And Ketolides   . Penicillins     Facial numbness  . Sertraline Hcl   . Shellfish Allergy   . Sulfa Antibiotics

## 2016-07-09 DIAGNOSIS — J849 Interstitial pulmonary disease, unspecified: Secondary | ICD-10-CM | POA: Diagnosis not present

## 2016-07-09 DIAGNOSIS — M0579 Rheumatoid arthritis with rheumatoid factor of multiple sites without organ or systems involvement: Secondary | ICD-10-CM | POA: Diagnosis not present

## 2016-07-09 DIAGNOSIS — Z79899 Other long term (current) drug therapy: Secondary | ICD-10-CM | POA: Diagnosis not present

## 2016-07-09 DIAGNOSIS — M170 Bilateral primary osteoarthritis of knee: Secondary | ICD-10-CM | POA: Diagnosis not present

## 2016-07-09 DIAGNOSIS — Z6841 Body Mass Index (BMI) 40.0 and over, adult: Secondary | ICD-10-CM | POA: Diagnosis not present

## 2016-07-12 DIAGNOSIS — J849 Interstitial pulmonary disease, unspecified: Secondary | ICD-10-CM | POA: Diagnosis not present

## 2016-07-12 DIAGNOSIS — R0603 Acute respiratory distress: Secondary | ICD-10-CM | POA: Diagnosis not present

## 2016-07-12 DIAGNOSIS — M05742 Rheumatoid arthritis with rheumatoid factor of left hand without organ or systems involvement: Secondary | ICD-10-CM | POA: Diagnosis not present

## 2016-07-12 DIAGNOSIS — G4733 Obstructive sleep apnea (adult) (pediatric): Secondary | ICD-10-CM | POA: Diagnosis not present

## 2016-07-12 DIAGNOSIS — M05741 Rheumatoid arthritis with rheumatoid factor of right hand without organ or systems involvement: Secondary | ICD-10-CM | POA: Diagnosis not present

## 2016-07-12 DIAGNOSIS — J439 Emphysema, unspecified: Secondary | ICD-10-CM | POA: Diagnosis not present

## 2016-07-12 DIAGNOSIS — J449 Chronic obstructive pulmonary disease, unspecified: Secondary | ICD-10-CM | POA: Diagnosis not present

## 2016-07-22 ENCOUNTER — Ambulatory Visit (INDEPENDENT_AMBULATORY_CARE_PROVIDER_SITE_OTHER): Payer: PPO | Admitting: Pulmonary Disease

## 2016-07-22 ENCOUNTER — Ambulatory Visit (INDEPENDENT_AMBULATORY_CARE_PROVIDER_SITE_OTHER)
Admission: RE | Admit: 2016-07-22 | Discharge: 2016-07-22 | Disposition: A | Discharge status: Home / Self Care | Payer: PPO | Source: Ambulatory Visit | Attending: Pulmonary Disease | Admitting: Pulmonary Disease

## 2016-07-22 ENCOUNTER — Encounter: Payer: Self-pay | Admitting: Pulmonary Disease

## 2016-07-22 DIAGNOSIS — J849 Interstitial pulmonary disease, unspecified: Secondary | ICD-10-CM

## 2016-07-22 DIAGNOSIS — R0600 Dyspnea, unspecified: Secondary | ICD-10-CM

## 2016-07-22 DIAGNOSIS — J439 Emphysema, unspecified: Secondary | ICD-10-CM | POA: Diagnosis not present

## 2016-07-22 MED ORDER — LEVOFLOXACIN 500 MG PO TABS: 500.0000 mg | ORAL_TABLET | Freq: Every day | ORAL | 2 refills | Status: DC

## 2016-07-22 MED ORDER — TIOTROPIUM BROMIDE MONOHYDRATE 1.25 MCG/ACT IN AERS: 2.0000 | INHALATION_SPRAY | Freq: Every day | RESPIRATORY_TRACT | 0 refills | Status: DC

## 2016-07-22 NOTE — Progress Notes (Signed)
Subjective:     Patient ID: [name], female   DOB: 1941-03-15, 76 y.o.   MRN: [id number]  HPI 76 y/o WF whose PCP is DrStoneking and followed by Rheum-DrHawkes w/ seropos RA, underlying ILD, and superimposed mixed COPD (emphysema and chr bronchitis) + exercise induced hypoxemia, & DJD; on Arava & low dose Pred plus formal PT, Xopenex NEBS Tid, Symbicort160-2spBid, Spiriva daily, & started on Home O2 (11/16) at 2L/min w/ exercise...  ~  March 29, 2011:  Initial pulmonary consult w/ KC>        The patient is a 77year-old female who I've been asked to see for ongoing pulmonary issues. She has been given the diagnosis of COPD years ago, but also tells me that she was diagnosed with asthma in her late 61s and put on a nebulizer. The patient thinks that her breathing is worsening, and she has had 3-4 episodes this year that have been labeled as "acute exacerbation" her last flareup was the end of November, and she was treated with antibiotics and prednisone. The patient notes a one block dyspnea on exertion at a moderate pace on flat ground. She will also get winded bringing groceries in from the car, and doing any kind of light housework. She notes very significant reflux symptoms, despite being on a proton pump inhibitor. She notes a barking cough at night and also first thing in the morning upon arising. The patient currently is on Symbicort, and has taken this compliantly. She has had no recent chest x-ray or full pulmonary function studies. Of note, her weight is neutral over the last one year.       PLAN>> I suspect, with the patient's long history of tobacco abuse, that she probably does have some degree of chronic airflow obstruction. It is unclear how much asthma may be playing a role here. She has had multiple episodes this year that required antibiotics and prednisone, and I wonder how much of this is related to her underlying lung issues or possibly laryngopharyngeal reflux. She is  having persistent reflux symptoms despite taking a proton pump inhibitor. She also describes cyclical coughing as well, which is typically an upper airway issue. I would like to check a chest x-ray today, and schedule her for full pulmonary function studies. I would also like to treat her reflux more aggressively over the next 3-4 weeks. We'll also add Spiriva to her Symbicort to see if this helps  CXR 03/29/11 showed borderline heart size, ectasia of thoracic Ao, low flat diaphragms, essentially clear lungs, osteopenia & degen spondylosis in spine...  FullPFTs 05/17/11 showed FVC=3.11 (99%), FEV1=1.67 (74%), %1sec=54, mid-flows reduced at 18% predicted; no improvement in FEV1 post bronchodil; TLC=4.52 (84%), RV=1.41 (65%), RV/TLC=31%; DLCO=50% predicted.=> c/w mod airflow obstruction, borderline lung volumes, moderate decr in diffusion capacity...   ~  May 17, 2011:  ROV w/ KC>        Patient comes in today for followup after her pulmonary function studies. She also had a cough at the last visit that I felt was more upper airway in origin. She was started on a more aggressive regimen for reflux, but has not seen a big difference. Now, she is having a lot of sinus congestion and pressure, and tells me that she recently was started on CPAP. Her heater on the humidifier is set fairly low based on her just her a period she was also started on Spiriva at the last visit, and has seen improvement in her breathing since  that time.      PLAN>>  The patient's cough seems more upper airway in origin, and I had tried her on a more intensified regimen for reflux disease. She saw some improvement, but not a lot. She is having a lot of nasal congestion and pressure, but has recently been started on CPAP and has her heated humidity at a low level. I have asked her to turn the heat up on the humidifier, and have recommended sinus rinses a.m. and p.m. Until she improves. If she continues to have issues, it is  possible that she may have a sinus infection. I would like to treat for reflux more aggressively for another 4 weeks, but if she does not see a big change would go back to her prior dose of proton pump inhibitor.      The patient has moderate airflow obstruction by pulmonary function studies today, and based on history, most consistent with a combination of emphysema and asthma. She has seen considerable improvement with the addition of Spiriva, and therefore I will leave her on this along with her Symbicort. I stressed to her the importance of weight loss and conditioning, and recommended referral to pulmonary rehabilitation.  Full PFTs 05/17/11>  FVC=3.11 (99%), FEV1=1.67 (74%), %1sec=54; mid-flows reduced at 18% predicted, after bronchodil her FEV1 improved 2%; TLC=4.52 (84%), RV=1.41 (65%), RV/TLC=31%; DLCO=50%pred & DL/VA=103%;  This shows a mod obstructive ventilatory defect, no signif response to bronchodil, borderline reduced lung vols, & reduced diffusion capacity...  ~  November 15, 2011:  41mo ROV w/ KC>        Patient comes in today for followup of her known COPD. She has seen a significant improvement in her breathing with the addition of Spiriva. She has not had any acute exacerbation since last visit, nor has she required her rescue inhaler. Her cough is also much improved with b.i.d. Proton pump inhibitor, and also making adjustments on her CPAP humidifier.      PLAN>>  Much improved with increased dose of PPI, and also with adjustment of heater on her cpap machine.  The patient is doing well on Symbicort and Spiriva, and has not had any flareups since the last visit. I have stressed to her the importance of weight loss and some type of exercise program in order to improve her exertional tolerance. She also feels that her cough is much improved since being on b.i.d. Proton pump inhibitor, and we'll continue this. If she is doing well, she is to follow up in 6 months (Pt cancelled f/u appt  05/2012 & never rescheduled).  CT scans done by DrStoneking>   CT Chest 07/12/13 showed norm heart size, atherosclerotic changes in Ao & coronaries, no adenopathy; biapical pleuroparenchymal scarring & emphysema, scattered subpleural nodules- 33mm LUL, 54mm RUL, 40mm RML, 33mm RLL...   CT Chest 02/02/14 showed norm heart size, calcif atherosclerotic Ao & coronaries, no adenopathy, centrilob & paraseptal emphysema noted, mult small nonspecific pulm nodules- 59mm in LUL, 50mm in lingula, 56mm in RUL, 67mm in RML and no new or enlarging pulm lesions (stable pulm nodules)...  ~  February 06, 2015:  Initial pulm eval by SN>  Pt referred by DrStoneking for progressive dyspnea and abnormal CT Chest... The patient's granddaughter is DrStoneking's nurse.      Pt presents very concerned about her pulmonary situation having recently had a 3rd CT Chest & was told that they detected more & bigger pulmonary nodules & she is scared that she might have cancer;  she brought a disc w/ CT Chest done 01/26/15 at [facility] & on my review the scan looks different than prev- now w/ widespread patchy ground glass opac superimposed on her prev emphysema changes & mult small pulm nodules (now largely obscured by this interstitial process); I am concerned this new ILD may represent COP (cryptogenic organizing pneumonia- the new terminology for our prev BOOP diagnosis)... She is c/o cough, small amt beige sput w/o hemoptysis, notes SOB/DOE w/ exertion (she used to swim a lot but recently has been more sedentary- esp since knee surg 10/2014); notes SOB w/ housework x36mo & stress makes the SOB worse, been using her Symbicort & Spiriva just "as needed" she says; DrStoneking got her a nebulizer w/ Xopenex to use TID but she has run out of this med...       In the course of our conversations [name] tells me that she was prev diagnosed w/ Lupus by DrGates and DrLevitin years ago (1997) but we do not have any old data from this period (note from  [facility] 02/01/15 just mentioned lung nodules and PMHx has no mention of SLE, prev abn blood tests, prev Rheum eval, etc);  The pt volunteers that she had a pos ANA & was treated by DrLevitin w/ Pred intermittently over a 3 yr period;  ?why she stopped going & it was never mentioned again... She does admit to hx arthritis, hurting all over (esp knees), and she had arthroscopic knee surg by [facility] 10/26/14 "he scraped it out"; she had post op therapy  But she noted her breathing was worse...   Smoking Hx>  she is an ex-smoker, starting at age 69 & smoked for about 50 yrs up to 1.5ppd, & quit in 2003; est ~50-60 pack-yr smoking hx...  Pulmonary Hx>  She reports hx diphtheria as child, had lots of recurrent bronchitic infections in her adult life, she reports several bouts of pneumonia & wonders if these infections left her w/ scar tissue, seen by DrClance 2013 w/ remote hx asthma/ now modCOPD treated w/ Symbicort, Spriva, PPI & antireflux regimen;  ?she had sleep eval 2013 & was started on CP[facility] (?[facility], no records avail)...  Medical Hx>  DrStoneking- HL, Obesity, GERD, Divertics, colon polyps, liver AVM, osteopenia, migraines, RLS, anxiety/depression...  Family Hx>  Hx asthma in her mother, otherw no FamHx lung problems...   Occup Hx>  No known occupational exposures- asbestos, chemical toxins, no recent travel or birds...  Current Meds>  Symbicort160, Spiriva, Xopenex neb, Mucinex, Claritin10, ASA81, Lisinopril5, Fish Oil, Prilosec20-2Bid, Celexa40, calcium/ vitamins... EXAM - Afeb, VSS, O2sat=92% on RA;  Obese at 251#, 5'7"Tall, BMI=39;  HEENT- neg, mallampati2;  Chest- decr BS bilat, few basilar rales, no wheezing or consolidation;  Heart- RR gr1-2 SEM w/o r/g;  Abd- obese soft neg;  Ext- w/o c/c/e  CT Chest 01/26/15 by [facility]- report by Dr. Anabel Halon- indicates extensive bilat pulm fibrosis, mult pulm masses & nodules- neoplasia cannot be excluded, mult nodes in  mediastinum- mostly wnl in size... To my review this shows widespread patchy ground glass opac superimposed on her prev emphysema changes & mult small pulm nodules (now largely obscured by this interstitial process); I am concerned this new ILD may represent COP.   CXR 02/06/15 showed norm heart size, lungs appear hyperinflated w/ coarsened interstitial markings bilat & an opacity at left lung base (all this new compared to last CXR 2012)...  Spirometry 02/06/15 showed FVC=1.97 (62%), FEV1=1.40 (60%), %1sec=71%, mid-flows are  reduced at 49% predicted;  This is suggestive of mixed mild obstructive & mod restrictive dis...  Ambulatory oxygen saturation test 01/2015> O2sat=92% on RA at rest w/ pulse=95/min;  She ambulated in office only 1 lap w/ lowest O2sat=88% w/ pulse=124/min...  LABS 01/2015>  Chems- wnl;  CBC- wnl x WBC=19.5;  TSH=6.41;  Collagen-vasc screen:  ACE=10 (nl<52), RheumFactor=100 (Hi pos >42), Anti-CCP=213 (strong pos >59), ANA pos 1:320 homogeneous pattern, ANCA screen= NEG (MPO & PR-3), Sed=54, CRP=4.5.Marland KitchenMarland Kitchen IMP/PLAN >>       New ILD per CXR & CT Chest- c/w COP/BOOP>  The totality of the eval including labs looks like this could be Rheumatoid lung dis; we will refer her to [facility] ASAP & start Pred rx...      Underlying COPD/emphysema, former smoker>  She is on Symbicort160-2spBid, Spiriva daily, Xopenex nebs Tid, Mucinex and rec to take meds regularly, NOT prn...      Hx mult small pulmonary nodules seen on prev CT scans in 2013>  This will need to be followed w/ serial CXR/ CT scans...      OSA- eval & Rx per [facility], no data avail in Epic>  Per DrStoneking & Maxwell Caul...      GERD/ prob LPR>  She needs a vigorous antireflux regimen w/ PPI Bid, NPO after dinner, elev HOB 6"; DrMJohnson is her GI...      Abn collagen-vasc screen w/ +RA, +Anti-CCP, +ANA, Sed54>  She was prev treated by DrLevitin for Lupus 1997-2000, labs look like RA & we will refer pt to [facility] for Rheum eval  at this time...  ~  March 14, 2015:  16mo ROV w/ SN>        At the time of our initial visit we started PRED40 x2wks=> 20 x2wks w/ f/u appt 71mo;  She saw [facility] for Rheum in the interval but we don't have note from her to review;  Pt tells me that she has Hx OA w/ prev knee arthroscopy by DrCaffrey & presist w/ lots of knee pain, but she claims that while she was taking the Pred20 she suddenly developed severe pain & swelling in her hands, wrists and ankles=> called DrHawkes who wanted to get her off the Pred & weaned her down further to 10mg /d- recall that we started the Pred for prob Rheumatoid Lung w/ CT scan suggesting COP/BOOP pattern;  She further indicates to me that she fell out of bed 2wks ago- bruised leg & arm notes it's hard to walk w/ pain in feet & legs;  She called DStoneking's office & was switched from Aleve to Tylenol;  It got so bad she went to the ER yest=> given Tramadol & told to f/u here;  She has a follow up appt w/ [facility] tomorrow 03/15/15...      From the pulmonary standpoint she is about the same, but the pain in her hands/ wrist has been her main complaint for the last week or so;  She remains on Symbicort160-2spBid, Spiriva daily, NEBS w/ Xopenex tid, Mucinex prn;  She has had trouble doing the inhalers since her hands &wrist swelled up, pain w/ actuation;  She is SOB & O2sat=90% on RA today in office;  Notes sl dry cough, no sput, no hemoptysis, no CP...       EXAM shows Afeb, VSS, O2sat=90% on RA;  HEENT- neg, mallampati2;  Chest- decr BS bilat, few basilar rales, no wheezing or consolidation;  Heart- RR gr1-2 SEM w/o r/g;  Abd- obese soft neg;  Ext- swollen/ tender wrists & hands!  CXR 03/13/15 in ER> mild cardiomeg, COPD/ hyperinflation, prominent lung markings bilat, no acute changes...   EKG 03/13/15 in ER> sinus tachy, rate 106/min, NSSTTWA, NAD...  Ambulatory O2sat test 03/14/15> O2sat=91% on RA at rest w/ pulse=86/min; on standing w/ min walk O2sat dropped to 85%  w/ pulse=106/min... IMP >>      New ILD per CXR & CT Chest- c/w COP/BOOP, prob related to Reumatoid Lung>  We started Pred 40=>20 but this was further cut to 10mg /d by [facility] in the interval; pulm status is clearly no better, ?sl worse, and she needs HOME O2 to be started rec 1-2L/min rest & 2L/min exercise => hypoxemic resp failure...      Underlying COPD/emphysema, former smoker>  She is on Symbicort160-2spBid, Spiriva daily, Xopenex nebs Tid, Mucinex and rec to take meds regularly everyday...      Hx mult small pulmonary nodules seen on prev CT scans in 2015> recent CT 01/2015 showed these nodules were masked by the ground glass opac & interstitial process; this will need to be followed w/ serial CXR/ CT scans...      OSA- eval & Rx per [facility], no data avail in Epic>  Per DrStoneking & Maxwell Caul...      GERD/ prob LPR>  She needs a vigorous antireflux regimen w/ PPI Bid, NPO after dinner, elev HOB 6"; DrMJohnson is her GI...      Abn collagen-vasc screen w/ +RA, +Anti-CCP, +ANA, Sed54> Looks like RA-  She was prev treated by DrLevitin for Lupus 1997-2000;  She was referred to [facility] for Rheum & her note is pending... PLAN >>       She is asked to keep the Pred at 10mg  per day for now due to her pulmonary process;  She has ROV tomorrow w/ Rheum for follow up & to eval her acute hand, wrist, foot arthritic process;  We are starting HOME O2 due to her acute hypoxemic resp failure... We will follow up in 52month, sooner if needed. ADDENDUM>> Pt seen by [facility] 11/30-- seropos RA, OA of both knees, ILD;  Insurance won't cover TNFs therefore started on ARAVA20 and Pred increased to 20mg /d... ADDENDUM>> Full PFTs 03/30/15:  FVC=2.23 (73%), FEV1=1.50 (65%), %1sec=68, mid-flows reduced at 51% predicted; post bronchodil the FEV1 improved 6%; TLC=4.13 (77%), RV=1.80 (75%), RV/TLC=44; DLCO=50% predicted;  These PFTs are compatible w/ mild restriction and a superimposed mild obstructive defect w/ a  mod-severe reduction is DLCO...  ~  May 24, 2015:  5mo ROV w/ SN>  [name] notes that he breathing is improved w/ her home O2 therapy but she would like an Inogen One POC & we will request this from her DME;  She notes SOB/DOE stable- no progression, and mild dry cough w/o sput, no CP etc... She has remained on Pred per [facility], no recent notes scanned into Epic; on Pred20/d + Arava and she is checked Q29mo w/ slow Pred taper...      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      Underlying COPD/emphysema, former smoker>  on Symbicort160-2spBid, Spiriva daily, Xopenex nebs Tid, Mucinex and rec to take meds regularly everyday...      Hypoxemic resp failure>  On Home O2 at 2L/min w/ O2sat=97% today in office; she is requesting a smaller POC- wants the Inogen One.Marland KitchenMarland Kitchen  Hx mult small pulmonary nodules seen on prev CT scans in 2015> recent CT 01/2015 at [place] ([facility]) showed these nodules were masked by the ground glass opac & interstitial process; CXR 11/16 w/ incr interstitial markings.      OSA- eval & Rx per [facility], no data avail in Epic>  per DrStoneking & Maxwell Caul...      GERD/ prob LPR>  She needs a vigorous antireflux regimen w/ PPI (Prilosec40) Bid, NPO after dinner, elev HOB 6"; DrMJohnson is her GI...      Abn collagen-vasc screen w/ +RA, +Anti-CCP, +ANA, Sed54> Looks like RA-  She was prev treated by DrLevitin for Lupus 1997-2000;  She saw [facility] 02/2015 & started on ARAVA while slowly weaning Pred. EXAM shows Afeb, VSS, O2sat=97% on 2L/min pulse dose;  Wt=267#, 5'7"Tall, BMI=40;  HEENT- neg, mallampati2;  Chest- decr BS bilat, few basilar rales, no wheezing or consolidation;  Heart- RR gr1-2 SEM w/o r/g;  Abd- obese soft neg;  Ext- swollen/ sl tender wrists & hands! IMP/PLAN>>  She is slowly weaning the Pred per Rheum & taking the Arava20;  We will request  a POC device for her;  We plan ROV in 3mo w/ f/u CXR/ CT Chest...  ~  July 24, 2015:  16mo ROV and Illa notes that her breathing is stable> she is an ex-smoker w/ mixed obstructive (COPD/emphysema) & restrictive (ILD w/ prob RA and obesity) lung dis, chronic hypoxemic resp failure on HomeO2;  She has been using her Symbocort160-2spBid, Spiriva daily, Mucinex600- one tab daily; she has not been using her nebulizer and notes that her breathing is good;  She is followed by DrHawkes Q107mo on Arava & slow Pred taper- currently on 10mg /d...      ILD/ pulm fibrosis per CXR & CT Chest- c/w COP/BOOP, prob related to Reumatoid Lung>  On Pred (currently 10mg /d) & Arava per DrHawkes, Rheum w/ freq OVs and slow taper of the Pred...      Underlying COPD/emphysema, former smoker>  on Symbicort160-2spBid, Spiriva daily, Xopenex nebs Tid (only using this prn), Mucinex and rec to take meds regularly everyday...      HEx-induced hypoxemic resp failure>  On Home O2 at 2L/min w/ O2sat=97% on 2L in office today on her new "simply go" POC...       Hx mult small pulmonary nodules seen on prev CT scans in 2015> CT 01/2015 at [place] ([facility]) showed these nodules were masked by the ground glass opac & interstitial process; CXR 11/16 w/ incr interstitial markings.      [facility]A- eval & Rx per [facility], no data avail in Epic>  per DrStoneking & Maxwell Caul...      GERD/ prob LPR>  She needs a vigorous antireflux regimen w/ PPI (Prilosec40) Bid, NPO after dinner, elev HOB 6"; DrMJohnson is her GI...      Abn collagen-vasc screen w/ +RA, +Anti-CCP, +ANA, Sed54> Looks like RA-  She was prev treated by DrLevitin for Lupus 1997-2000;  She saw [facility] 02/2015 & started on ARAVA while slowly weaning Pred. EXAM shows Afeb, VSS, O2sat=97% on 2L/min pulse dose;  Wt=267#, 5'7"Tall, BMI=40;  HEENT- neg, mallampati2;  Chest- decr BS bilat, few basilar rales, no wheezing or consolidation;  Heart- RR gr1-2 SEM w/o r/g;  Abd- obese soft neg;   Ext- much improved wrists & hands  CT Chest => done 07/28/15> norm heart size, atherosclerotic changes in Ao & LAD, mod centrilobular & paraseptal emphysema bilaterally, mult pulmonary nodules are unchanged from 06/2013  scan and felt c/w benign process, NO MENTION MADE OF INTERSTITIAL PROCESS SEEN ON CT CHEST 01/26/15 from [facility] => now largely resolved...    IMP/PLAN>>  [name]'s breathing is stable, at her new baseline & we will proceed w/ new CT Chest to compare to the old; slow Pred taper per rheum and she desperately needs to get on diet & get weight down; continue O2, Symbicort, Spiriva, etc...  ~  January 22, 2016:  11mo ROV w/ SN>  [name] reports an acute exac of her COPD starting several days ago w/ cough, greenish sput production, mod chest congestion, no hemoptysis, sl incr SOB but no CP/ palpait/ edema; she remains on Home O2 at 1-2L/min, NEBS w/ Xopenex Tid, Symbicort160-2spBid, Spiriva daily; Her Pred was boosted to 12.5mg /d in August by [facility] for an RA flair at that time & she continues on the [place] daily;  We discussed treatment for this AB exac w/ Levaquin + probiotic...       ILD/ pulm fibrosis per CXRs & prev CT Chest- c/w COP/BOOP, prob related to Reumatoid Lung>  On Pred (currently 12.5mg /d per DrHawkes) & Arava, Rheum w/ freq OVs and slow taper of the Pred planned...      Underlying mixed COPD w/ emphysema & chr bronchitis, former smoker>  on Symbicort160-2spBid, Spiriva daily, Xopenex nebs Tid (only using this prn), Mucinex and rec to take meds regularly everyday...      Hx ex-induced hypoxemic resp failure>  On Home O2 at 2L/min w/ O2sat=97% on 2L in office today on her new "simply go" POC...       Hx mult small pulmonary nodules seen on prev CT scans in 2015> CT 01/2015 at [place] ([facility]) showed these nodules were masked by the ground glass opac & interstitial process; CXR 11/16 w/ incr interstitial markings; f/u CT 07/2015 showed stable pulm nodules and resolution of the  interstitial process prev seen...      [facility]A- eval & Rx per [facility], no data avail in Epic>  per DrStoneking & Maxwell Caul...      Obesity>  [name] weighs ~270#, 5'7"Tall, BMI=42; she has been counseled on DIET/ EXERCISE/ wt reducing strategies...      GERD/ prob LPR>  She needs a vigorous antireflux regimen w/ PPI (Prilosec40) Bid, NPO after dinner, elev HOB 6"; DrMJohnson is her GI...      Abn collagen-vasc screen w/ +RA, +Anti-CCP, +ANA, Sed54> +RA-  She was prev treated by DrLevitin for Lupus 1997-2000;  She saw [facility] 02/2015 & started on ARAVA while slowly weaning Pred. EXAM shows Afeb, VSS, O2sat=100% on 1L/min pulse dose;  Wt=268#, 5'7"Tall, BMI=40;  HEENT- neg, mallampati2;  Chest- decr BS bilat, few basilar rales, no wheezing or consolidation;  Heart- RR gr1-2 SEM w/o r/g;  Abd- obese soft neg;  Ext- much improved wrists & hands IMP/PLAN>>  I placed [name] on Levaquin for this bronchitic flair & rec that she take a probiotic like Align while on the antibiotic;  She will continue her O2, Prednisone, Symbicort, Spiriva, & Mucinex;  Reminded about the recommended antireflux regimen;  Also reminded about diet/ exercise & wt reduction strategies...   ~  July 22, 2016:  54mo ROV & pulmonary recheck> Her PCP is DrStoneking, & Rheum- DrHawkes=> outpt rehab for difficullty walking...  She states her breathing is good, no change; notes incr SOB w/ activities but ADLs are still ok (eg-  Vacuums and takes her time, but stairs are bad)... On O2 at 1L/min rest & 2L/min  activity; current Pred taper, Symbicort160-2spBid, Spiriva once daily,  XOPENEX NEBS but not using Tid- just does this Prn she says & "hasn't needed";  She has Mucinex & Claritin for as needed use too;  She remains on ARAVA per [facility]...    EXAM shows Afeb, VSS, O2sat=96% on 1L/min pulse dose;  Wt=276#, 5'7"Tall, BMI=41;  HEENT- neg, mallampati2;  Chest- decr BS bilat, few basilar rales, no wheezing or consolidation;  Heart- RR gr1-2 SEM w/o  r/g;  Abd- obese soft neg;  Ext- much improved wrists & hands.  CXR 07/22/16>  Norm heart size, hyperinflation c/w COPD, mild chr interstitial thickening- no focal abn & NAD...  IMP/PLAN>>  [name] appears stable but must desperately lose the weight!  She requests a Rx for [facility] to keep on hand & we discussed this- must DIET/ EXERCISE/ get wt down! Continue same meds...    Past Medical History:  Diagnosis Date  . Allergic rhinitis   . Asthmatic bronchitis   . Colon polyps   . COPD (chronic obstructive pulmonary disease) ([place])   . Diverticulosis   . GERD (gastroesophageal reflux disease)   . H/O: GI bleed   . Hypercholesterolemia >> PCP is DrStoneking   . Migraine headache   . Obesity   . RLS (restless legs syndrome)    Osteoarthritis    RHEUMATOID ARTHRITIS >> followed by [facility]     Past Surgical History:  Procedure Laterality Date  . TONSILLECTOMY  x2  . TOTAL ABDOMINAL HYSTERECTOMY  02/17/1989    Outpatient Encounter Prescriptions as of 07/22/2016  Medication Sig  . acetaminophen (TYLENOL) 500 MG tablet Take 1,000 mg by mouth every 6 (six) hours as needed for moderate pain.  . Ascorbic Acid (VITAMIN C) 1000 MG tablet Take 1,000 mg by mouth daily.  Marland Kitchen aspirin 81 MG tablet Take 81 mg by mouth every other day.  Marland Kitchen atorvastatin (LIPITOR) 10 MG tablet Take 10 mg by mouth daily.  . budesonide-formoterol (SYMBICORT) 160-4.5 MCG/ACT inhaler Inhale 2 puffs into the lungs 2 (two) times daily.  . Calcium Carbonate-Vitamin D (CALCIUM + D) 600-200 MG-UNIT TABS Take 1 tablet by mouth daily.    . citalopram (CELEXA) 40 MG tablet Take 40 mg by mouth daily.  Marland Kitchen guaiFENesin (MUCINEX) 600 MG 12 hr tablet Take 600 mg by mouth daily.   . Leflunomide (ARAVA PO) Take by mouth daily.  Marland Kitchen levalbuterol (XOPENEX) 1.25 MG/3ML nebulizer solution Take 1.25 mg by nebulization 3 (three) times daily. (Patient taking differently: Take 1.25 mg by nebulization 3 (three) times daily as needed. )  . levofloxacin  (LEVAQUIN) 500 MG tablet Take 1 tablet (500 mg total) by mouth daily.  Marland Kitchen lisinopril (PRINIVIL,ZESTRIL) 5 MG tablet Take 5 mg by mouth daily.   Marland Kitchen loratadine (CLARITIN) 10 MG tablet Take 10 mg by mouth daily.    . Omega-3 Fatty Acids (FISH OIL) 1200 MG CAPS Take 1 capsule by mouth daily.    Marland Kitchen omeprazole (PRILOSEC) 40 MG capsule Take 1 capsule by mouth 2 (two) times daily.  . OXYGEN Inhale into the lungs. 2 lpm with rest and exertion  . predniSONE (DELTASONE) 10 MG tablet Take 10 mg by mouth daily with breakfast.   . Tiotropium Bromide Monohydrate (SPIRIVA RESPIMAT) 1.25 MCG/ACT AERS Inhale 2 puffs into the lungs daily.  . traMADol (ULTRAM) 50 MG tablet Take 1 tablet (50 mg total) by mouth every 6 (six) hours as needed.  . valACYclovir (VALTREX) 1000 MG tablet Take 1,000 mg by mouth daily as  needed (for breakouts).   . [DISCONTINUED] levofloxacin (LEVAQUIN) 500 MG tablet Take 1 tablet (500 mg total) by mouth daily.  . [DISCONTINUED] tiotropium (SPIRIVA) 18 MCG inhalation capsule Place 1 capsule (18 mcg total) into inhaler and inhale daily.  . Tiotropium Bromide Monohydrate (SPIRIVA RESPIMAT) 1.25 MCG/ACT AERS Inhale 2 puffs into the lungs daily.  . [DISCONTINUED] budesonide-formoterol (SYMBICORT) 160-4.5 MCG/ACT inhaler Inhale 2 puffs into the lungs 2 (two) times daily.    No facility-administered encounter medications on file as of 07/22/2016.     Allergies  Allergen Reactions  . Macrolides And Ketolides   . Penicillins     Facial numbness  . Sertraline Hcl   . Shellfish Allergy   . Sulfa Antibiotics     Immunization History  Administered Date(s) Administered  . Influenza, High Dose Seasonal PF 01/17/2016  . Influenza-Unspecified 12/30/2014  . Pneumococcal Conjugate-13 07/07/2013  . Pneumococcal Polysaccharide-23 03/03/2009    Current Medications, Allergies, Past Medical History, Past Surgical History, Family History, and Social History were reviewed in [employer] record.   Review of Systems             All symptoms NEG except where BOLDED >>  Constitutional:  F/C/S, fatigue, anorexia, unexpected weight change. HEENT:  HA, visual changes, hearing loss, earache, nasal symptoms, sore throat, mouth sores, hoarseness. Resp:  cough, sputum, hemoptysis; SOB, tightness, wheezing. Cardio:  CP, palpit, DOE, orthopnea, edema. GI:  N/V/D/C, blood in stool; reflux, abd pain, distention, gas. GU:  dysuria, freq, urgency, hematuria, flank pain, voiding difficulty. MS:  joint pain, swelling, tenderness, decr ROM; neck pain, back pain, etc. Neuro:  HA, tremors, seizures, dizziness, syncope, weakness, numbness, gait abn. Skin:  suspicious lesions or skin rash. Heme:  adenopathy, bruising, bleeding. Psyche:  confusion, agitation, sleep disturbance, hallucinations, anxiety, depression suicidal.   Objective:   Physical Exam       Vital Signs:  Reviewed...   General:  WD, obese, 76 y/o WF in NAD; alert & oriented; pleasant & cooperative... HEENT:  [place]/AT; Conjunctiva- pink, Sclera- nonicteric, EOM-wnl, PERRLA, EACs-clear, TMs-wnl; NOSE-clear; THROAT-clear & wnl.  Neck:  Supple w/ fair ROM; no JVD; normal carotid impulses w/o bruits; no thyromegaly or nodules palpated; no lymphadenopathy.  Chest:  Decr BS bilat, few basilar rales, no wheezing rhonchi or signs of consolidation... Heart:  Regular Rhythm; norm S1 & S2 gr1-2/6 SEM, w/o rubs or gallops detected. Abdomen:  Soft & nontender- no guarding or rebound; normal bowel sounds; no organomegaly or masses palpated. Ext:  decrROM; without deformities +arthritic changes; no varicose veins, +venous insuffic, tr edema;  Pulses intact w/o bruits. Neuro:  No focal neuro deficits; sensory testing normal; gait OK & balance OK. Derm:  No lesions noted; no rash etc. Lymph:  No cervical, supraclavicular, axillary, or inguinal adenopathy palpated.   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      Underlying COPD/emphysema, former smoker>  on Symbicort160-2spBid, Spiriva daily, Xopenex nebs Tid (only using this prn), Mucinex and rec to take meds regularly everyday...      Hypoxemic resp failure>  On Home O2 at 2L/min w/ O2sat=97% on 2L in office today on her new "simply go" POC...       Hx mult small pulmonary nodules seen  on prev CT scans in 2015> CT 01/2015 at [place] ([facility]) showed these nodules were masked by the ground glass opac & interstitial process; CXR 11/16 w/ incr interstitial markings.      OSA- eval & Rx per [facility], no data avail in Epic>  per DrStoneking & Maxwell Caul...      GERD/ prob LPR>  She needs a vigorous antireflux regimen w/ PPI (Prilosec40) Bid, NPO after dinner, elev HOB 6"; DrMJohnson is her GI...      Abn collagen-vasc screen w/ +RA, +Anti-CCP, +ANA, Sed54> Looks like RA-  She was prev treated by DrLevitin for Lupus 1997-2000;  She saw [facility] 02/2015 & started on ARAVA while slowly weaning Pred.  PLAN >>  02/10/15:  Start Pred 20mg  tabs- 40mg  Qam for 2 wks, then 20mg /d til return;  Refer to [place], ASAP;  Continue other meds reglarly (Symbicort, Spiriva, Xopenex, Mucinex);  Needs vigorous antireflux regimen... 03/14/15:  She is asked to keep the Pred at 10mg  per day for now due to her pulmonary process;  She has ROV tomorrow w/ Rheum for follow up & to eval her acute hand, wrist, foot arthritic process;  We are starting HOME O2 due to her acute hypoxemic resp failure...  05/24/15:   She has established w/ DrHawkes for Rheum on slow Pred taper + Arava;  Breathing is stable on O2, Pred, XopenexTid, Symbicort160, Spiriva, and Mucinex... 07/24/15>   [name]'s breathing is stable, at her new baseline & we will proceed w/ new CT Chest to compare to the old; slow Pred taper per rheum and she desperately  needs to get on diet & get weight down; continue O2, Symbicort, Spiriva, etc... 01/22/16>   I placed [name] on Levaquin for this bronchitic flair & rec that she take a probiotic like Align while on the antibiotic;  She will continue her O2, Prednisone, Symbicort, Spiriva, & Mucinex;  Reminded about the recommended antireflux regimen;  Also reminded about diet/ exercise & wt reduction strategies     Plan:                                      HOME O2 at 1-2L/min at rest and 2L/min w/ activity... Patient's Medications  New Prescriptions   TIOTROPIUM BROMIDE MONOHYDRATE (SPIRIVA RESPIMAT) 1.25 MCG/ACT AERS    Inhale 2 puffs into the lungs daily.  Previous Medications   ACETAMINOPHEN (TYLENOL) 500 MG TABLET    Take 1,000 mg by mouth every 6 (six) hours as needed for moderate pain.   ASCORBIC ACID (VITAMIN C) 1000 MG TABLET    Take 1,000 mg by mouth daily.   ASPIRIN 81 MG TABLET    Take 81 mg by mouth every other day.   ATORVASTATIN (LIPITOR) 10 MG TABLET    Take 10 mg by mouth daily.   BUDESONIDE-FORMOTEROL (SYMBICORT) 160-4.5 MCG/ACT INHALER    Inhale 2 puffs into the lungs 2 (two) times daily.   CALCIUM CARBONATE-VITAMIN D (CALCIUM + D) 600-200 MG-UNIT TABS    Take 1 tablet by mouth daily.     CITALOPRAM (CELEXA) 40 MG TABLET    Take 40 mg by mouth daily.   GUAIFENESIN (MUCINEX) 600 MG 12 HR TABLET    Take 600 mg by mouth daily.    LEFLUNOMIDE (ARAVA PO)    Take by mouth daily.   LEVALBUTEROL (XOPENEX) 1.25 MG/3ML NEBULIZER SOLUTION    Take 1.25 mg by  nebulization 3 (three) times daily.   LISINOPRIL (PRINIVIL,ZESTRIL) 5 MG TABLET    Take 5 mg by mouth daily.    LORATADINE (CLARITIN) 10 MG TABLET    Take 10 mg by mouth daily.     OMEGA-3 FATTY ACIDS (FISH OIL) 1200 MG CAPS    Take 1 capsule by mouth daily.     OMEPRAZOLE (PRILOSEC) 40 MG CAPSULE    Take 1 capsule by mouth 2 (two) times daily.   OXYGEN    Inhale into the lungs. 2 lpm with rest and exertion   PREDNISONE (DELTASONE) 10 MG TABLET     Take 10 mg by mouth daily with breakfast.    TIOTROPIUM BROMIDE MONOHYDRATE (SPIRIVA RESPIMAT) 1.25 MCG/ACT AERS    Inhale 2 puffs into the lungs daily.   TRAMADOL (ULTRAM) 50 MG TABLET    Take 1 tablet (50 mg total) by mouth every 6 (six) hours as needed.   VALACYCLOVIR (VALTREX) 1000 MG TABLET    Take 1,000 mg by mouth daily as needed (for breakouts).   Modified Medications   Modified Medication Previous Medication   LEVOFLOXACIN (LEVAQUIN) 500 MG TABLET levofloxacin (LEVAQUIN) 500 MG tablet      Take 1 tablet (500 mg total) by mouth daily.    Take 1 tablet (500 mg total) by mouth daily.  Discontinued Medications   BUDESONIDE-FORMOTEROL (SYMBICORT) 160-4.5 MCG/ACT INHALER    Inhale 2 puffs into the lungs 2 (two) times daily.    TIOTROPIUM (SPIRIVA) 18 MCG INHALATION CAPSULE    Place 1 capsule (18 mcg total) into inhaler and inhale daily.

## 2016-07-22 NOTE — Patient Instructions (Signed)
Today we updated your med list in our EPIC system...    Continue your current medications the same...  We refilled your LEVAQUIN to keep on hand to use when needed...  Today we did a follow up CXR...    We will contact you w/ the results when available...   We discussed DIET & whatever exercise you can muster to help you lose the weight...  Call for any questions...  Let's plan a follow up visit in 11mo, sooner if needed for problems.Marland KitchenMarland Kitchen

## 2016-07-23 DIAGNOSIS — Z79899 Other long term (current) drug therapy: Secondary | ICD-10-CM | POA: Diagnosis not present

## 2016-07-23 DIAGNOSIS — E782 Mixed hyperlipidemia: Secondary | ICD-10-CM | POA: Diagnosis not present

## 2016-07-23 DIAGNOSIS — R739 Hyperglycemia, unspecified: Secondary | ICD-10-CM | POA: Diagnosis not present

## 2016-07-31 DIAGNOSIS — Z23 Encounter for immunization: Secondary | ICD-10-CM | POA: Diagnosis not present

## 2016-07-31 DIAGNOSIS — M79604 Pain in right leg: Secondary | ICD-10-CM | POA: Diagnosis not present

## 2016-07-31 DIAGNOSIS — I10 Essential (primary) hypertension: Secondary | ICD-10-CM | POA: Diagnosis not present

## 2016-07-31 DIAGNOSIS — J449 Chronic obstructive pulmonary disease, unspecified: Secondary | ICD-10-CM | POA: Diagnosis not present

## 2016-07-31 DIAGNOSIS — Z6841 Body Mass Index (BMI) 40.0 and over, adult: Secondary | ICD-10-CM | POA: Diagnosis not present

## 2016-07-31 DIAGNOSIS — E782 Mixed hyperlipidemia: Secondary | ICD-10-CM | POA: Diagnosis not present

## 2016-07-31 DIAGNOSIS — M79605 Pain in left leg: Secondary | ICD-10-CM | POA: Diagnosis not present

## 2016-07-31 DIAGNOSIS — J9611 Chronic respiratory failure with hypoxia: Secondary | ICD-10-CM | POA: Diagnosis not present

## 2016-08-06 DIAGNOSIS — J439 Emphysema, unspecified: Secondary | ICD-10-CM | POA: Diagnosis not present

## 2016-08-06 DIAGNOSIS — M05741 Rheumatoid arthritis with rheumatoid factor of right hand without organ or systems involvement: Secondary | ICD-10-CM | POA: Diagnosis not present

## 2016-08-06 DIAGNOSIS — J449 Chronic obstructive pulmonary disease, unspecified: Secondary | ICD-10-CM | POA: Diagnosis not present

## 2016-08-06 DIAGNOSIS — R0603 Acute respiratory distress: Secondary | ICD-10-CM | POA: Diagnosis not present

## 2016-08-06 DIAGNOSIS — G4733 Obstructive sleep apnea (adult) (pediatric): Secondary | ICD-10-CM | POA: Diagnosis not present

## 2016-08-06 DIAGNOSIS — M05742 Rheumatoid arthritis with rheumatoid factor of left hand without organ or systems involvement: Secondary | ICD-10-CM | POA: Diagnosis not present

## 2016-08-06 DIAGNOSIS — J849 Interstitial pulmonary disease, unspecified: Secondary | ICD-10-CM | POA: Diagnosis not present

## 2016-08-12 DIAGNOSIS — J849 Interstitial pulmonary disease, unspecified: Secondary | ICD-10-CM | POA: Diagnosis not present

## 2016-08-12 DIAGNOSIS — G4733 Obstructive sleep apnea (adult) (pediatric): Secondary | ICD-10-CM | POA: Diagnosis not present

## 2016-08-12 DIAGNOSIS — R0603 Acute respiratory distress: Secondary | ICD-10-CM | POA: Diagnosis not present

## 2016-08-12 DIAGNOSIS — M05742 Rheumatoid arthritis with rheumatoid factor of left hand without organ or systems involvement: Secondary | ICD-10-CM | POA: Diagnosis not present

## 2016-08-12 DIAGNOSIS — M05741 Rheumatoid arthritis with rheumatoid factor of right hand without organ or systems involvement: Secondary | ICD-10-CM | POA: Diagnosis not present

## 2016-08-12 DIAGNOSIS — J449 Chronic obstructive pulmonary disease, unspecified: Secondary | ICD-10-CM | POA: Diagnosis not present

## 2016-08-12 DIAGNOSIS — J439 Emphysema, unspecified: Secondary | ICD-10-CM | POA: Diagnosis not present

## 2016-08-29 DIAGNOSIS — N3946 Mixed incontinence: Secondary | ICD-10-CM | POA: Diagnosis not present

## 2016-08-29 DIAGNOSIS — R350 Frequency of micturition: Secondary | ICD-10-CM | POA: Diagnosis not present

## 2016-09-11 DIAGNOSIS — J849 Interstitial pulmonary disease, unspecified: Secondary | ICD-10-CM | POA: Diagnosis not present

## 2016-09-11 DIAGNOSIS — M05742 Rheumatoid arthritis with rheumatoid factor of left hand without organ or systems involvement: Secondary | ICD-10-CM | POA: Diagnosis not present

## 2016-09-11 DIAGNOSIS — M05741 Rheumatoid arthritis with rheumatoid factor of right hand without organ or systems involvement: Secondary | ICD-10-CM | POA: Diagnosis not present

## 2016-09-11 DIAGNOSIS — J449 Chronic obstructive pulmonary disease, unspecified: Secondary | ICD-10-CM | POA: Diagnosis not present

## 2016-09-11 DIAGNOSIS — J439 Emphysema, unspecified: Secondary | ICD-10-CM | POA: Diagnosis not present

## 2016-09-11 DIAGNOSIS — G4733 Obstructive sleep apnea (adult) (pediatric): Secondary | ICD-10-CM | POA: Diagnosis not present

## 2016-09-11 DIAGNOSIS — R0603 Acute respiratory distress: Secondary | ICD-10-CM | POA: Diagnosis not present

## 2016-10-09 DIAGNOSIS — M0579 Rheumatoid arthritis with rheumatoid factor of multiple sites without organ or systems involvement: Secondary | ICD-10-CM | POA: Diagnosis not present

## 2016-10-09 DIAGNOSIS — M170 Bilateral primary osteoarthritis of knee: Secondary | ICD-10-CM | POA: Diagnosis not present

## 2016-10-09 DIAGNOSIS — Z6841 Body Mass Index (BMI) 40.0 and over, adult: Secondary | ICD-10-CM | POA: Diagnosis not present

## 2016-10-09 DIAGNOSIS — M2550 Pain in unspecified joint: Secondary | ICD-10-CM | POA: Diagnosis not present

## 2016-10-09 DIAGNOSIS — Z79899 Other long term (current) drug therapy: Secondary | ICD-10-CM | POA: Diagnosis not present

## 2016-10-09 DIAGNOSIS — J849 Interstitial pulmonary disease, unspecified: Secondary | ICD-10-CM | POA: Diagnosis not present

## 2016-10-12 DIAGNOSIS — R0603 Acute respiratory distress: Secondary | ICD-10-CM | POA: Diagnosis not present

## 2016-10-12 DIAGNOSIS — J849 Interstitial pulmonary disease, unspecified: Secondary | ICD-10-CM | POA: Diagnosis not present

## 2016-10-12 DIAGNOSIS — J449 Chronic obstructive pulmonary disease, unspecified: Secondary | ICD-10-CM | POA: Diagnosis not present

## 2016-10-12 DIAGNOSIS — G4733 Obstructive sleep apnea (adult) (pediatric): Secondary | ICD-10-CM | POA: Diagnosis not present

## 2016-10-12 DIAGNOSIS — M05742 Rheumatoid arthritis with rheumatoid factor of left hand without organ or systems involvement: Secondary | ICD-10-CM | POA: Diagnosis not present

## 2016-10-12 DIAGNOSIS — M05741 Rheumatoid arthritis with rheumatoid factor of right hand without organ or systems involvement: Secondary | ICD-10-CM | POA: Diagnosis not present

## 2016-10-12 DIAGNOSIS — J439 Emphysema, unspecified: Secondary | ICD-10-CM | POA: Diagnosis not present

## 2016-11-11 DIAGNOSIS — J849 Interstitial pulmonary disease, unspecified: Secondary | ICD-10-CM | POA: Diagnosis not present

## 2016-11-11 DIAGNOSIS — M05741 Rheumatoid arthritis with rheumatoid factor of right hand without organ or systems involvement: Secondary | ICD-10-CM | POA: Diagnosis not present

## 2016-11-11 DIAGNOSIS — R0603 Acute respiratory distress: Secondary | ICD-10-CM | POA: Diagnosis not present

## 2016-11-11 DIAGNOSIS — J449 Chronic obstructive pulmonary disease, unspecified: Secondary | ICD-10-CM | POA: Diagnosis not present

## 2016-11-11 DIAGNOSIS — G4733 Obstructive sleep apnea (adult) (pediatric): Secondary | ICD-10-CM | POA: Diagnosis not present

## 2016-11-11 DIAGNOSIS — M05742 Rheumatoid arthritis with rheumatoid factor of left hand without organ or systems involvement: Secondary | ICD-10-CM | POA: Diagnosis not present

## 2016-11-11 DIAGNOSIS — J439 Emphysema, unspecified: Secondary | ICD-10-CM | POA: Diagnosis not present

## 2016-11-12 ENCOUNTER — Encounter: Payer: Self-pay | Admitting: Pulmonary Disease

## 2016-11-12 ENCOUNTER — Ambulatory Visit (INDEPENDENT_AMBULATORY_CARE_PROVIDER_SITE_OTHER): Payer: PPO | Admitting: Pulmonary Disease

## 2016-11-12 ENCOUNTER — Ambulatory Visit (INDEPENDENT_AMBULATORY_CARE_PROVIDER_SITE_OTHER)
Admission: RE | Admit: 2016-11-12 | Discharge: 2016-11-12 | Disposition: A | Discharge status: Home / Self Care | Payer: PPO | Source: Ambulatory Visit | Attending: Pulmonary Disease | Admitting: Pulmonary Disease

## 2016-11-12 VITALS — BP 128/82 | HR 72 | Temp 98.1°F | Ht 67.0 in | Wt 267.4 lb

## 2016-11-12 DIAGNOSIS — J449 Chronic obstructive pulmonary disease, unspecified: Secondary | ICD-10-CM

## 2016-11-12 DIAGNOSIS — J849 Interstitial pulmonary disease, unspecified: Secondary | ICD-10-CM

## 2016-11-12 DIAGNOSIS — R05 Cough: Secondary | ICD-10-CM | POA: Diagnosis not present

## 2016-11-12 DIAGNOSIS — R0609 Other forms of dyspnea: Secondary | ICD-10-CM | POA: Diagnosis not present

## 2016-11-12 DIAGNOSIS — M069 Rheumatoid arthritis, unspecified: Secondary | ICD-10-CM

## 2016-11-12 DIAGNOSIS — R079 Chest pain, unspecified: Secondary | ICD-10-CM | POA: Diagnosis not present

## 2016-11-12 DIAGNOSIS — R0600 Dyspnea, unspecified: Secondary | ICD-10-CM

## 2016-11-12 MED ORDER — TRAMADOL HCL 50 MG PO TABS: ORAL_TABLET | ORAL | 0 refills | Status: DC

## 2016-11-12 NOTE — Patient Instructions (Addendum)
Today we updated your med list in our EPIC system...    Continue your current medications the same...  Today we checked an EKG, CXR, and an ambulatory oximetry test -- due to your CP & dyspnea...    We will contact you w/ the results when available...   For the discomfort>>    BE CAREFUL- avoid any trauma, heavy lifting, etc...    Apply HEAT-- heating pad, hot towel, deep heat, Salonpas/ etc...    Take the TRAMADOL 50mg  1/2 to 1 tab three times daily with an extra strength Tylenol...  Keep up the good work w/ DIET, & gradually increase your exercise program to burn more calories and lose more weight!  Call for any questions...  Let's plan a follow up visit in 2-3 mo (already sched for 01/21/17 at 10:30 AM)...

## 2016-11-12 NOTE — Progress Notes (Signed)
Subjective:     Patient ID: [name], female   DOB: 12/14/1940, 76 y.o.   MRN: [id number]  HPI    76 y/o WF whose PCP is DrStoneking and followed by Rheum-DrHawkes w/ seropos RA, underlying ILD, and superimposed mixed COPD (emphysema and chr bronchitis) + exercise induced hypoxemia, & DJD; on Arava & low dose Pred plus formal PT, Xopenex NEBS Tid, Symbicort160-2spBid, Spiriva daily, & started on Home O2 (11/16) at 2L/min w/ exercise...  ~  SEE PREV EPIC NOTES FOR OLDER DATA>  ~  March 29, 2011:  Initial pulmonary consult w/ KC>        The patient is a 76year-old female who I've been asked to see for ongoing pulmonary issues. She has been given the diagnosis of COPD years ago, but also tells me that she was diagnosed with asthma in her late 75s and put on a nebulizer. The patient thinks that her breathing is worsening, and she has had 3-4 episodes this year that have been labeled as "acute exacerbation" her last flareup was the end of November, and she was treated with antibiotics and prednisone. The patient notes a one block dyspnea on exertion at a moderate pace on flat ground. She will also get winded bringing groceries in from the car, and doing any kind of light housework. She notes very significant reflux symptoms, despite being on a proton pump inhibitor. She notes a barking cough at night and also first thing in the morning upon arising. The patient currently is on Symbicort, and has taken this compliantly. She has had no recent chest x-ray or full pulmonary function studies. Of note, her weight is neutral over the last one year.       PLAN>> I suspect, with the patient's long history of tobacco abuse, that she probably does have some degree of chronic airflow obstruction. It is unclear how much asthma may be playing a role here. She has had multiple episodes this year that required antibiotics and prednisone, and I wonder how much of this is related to her underlying lung issues or  possibly laryngopharyngeal reflux. She is having persistent reflux symptoms despite taking a proton pump inhibitor. She also describes cyclical coughing as well, which is typically an upper airway issue. I would like to check a chest x-ray today, and schedule her for full pulmonary function studies. I would also like to treat her reflux more aggressively over the next 3-4 weeks. We'll also add Spiriva to her Symbicort to see if this helps   CXR 03/29/11 showed borderline heart size, ectasia of thoracic Ao, low flat diaphragms, essentially clear lungs, osteopenia & degen spondylosis in spine...  FullPFTs 05/17/11 showed FVC=3.11 (99%), FEV1=1.67 (74%), %1sec=54, mid-flows reduced at 18% predicted; no improvement in FEV1 post bronchodil; TLC=4.52 (84%), RV=1.41 (65%), RV/TLC=31%; DLCO=50% predicted.=> c/w mod airflow obstruction, borderline lung volumes, moderate decr in diffusion capacity...    CT Chest 07/12/13 per DrStoneking showed norm heart size, atherosclerotic changes in Ao & coronaries, no adenopathy; biapical pleuroparenchymal scarring & emphysema, scattered subpleural nodules- 63mm LUL, 1mm RUL, 37mm RML, 62mm RLL...   CT Chest 02/02/14 per DrStoneking showed norm heart size, calcif atherosclerotic Ao & coronaries, no adenopathy, centrilob & paraseptal emphysema noted, mult small nonspecific pulm nodules- 12mm in LUL, 67mm in lingula, 67mm in RUL, 21mm in RML and no new or enlarging pulm lesions (stable pulm nodules)...  ~  February 06, 2015:  Initial pulm eval by SN>  Pt referred by DrStoneking for progressive  dyspnea and abnormal CT Chest... The patient's granddaughter is DrStoneking's nurse.      Pt presents very concerned about her pulmonary situation having recently had a 3rd CT Chest & was told that they detected more & bigger pulmonary nodules & she is scared that she might have cancer; she brought a disc w/ CT Chest done 01/26/15 at [facility] & on my review the scan looks different than  prev- now w/ widespread patchy ground glass opac superimposed on her prev emphysema changes & mult small pulm nodules (now largely obscured by this interstitial process); I am concerned this new ILD may represent COP (cryptogenic organizing pneumonia- the new terminology for our prev BOOP diagnosis)... She is c/o cough, small amt beige sput w/o hemoptysis, notes SOB/DOE w/ exertion (she used to swim a lot but recently has been more sedentary- esp since knee surg 10/2014); notes SOB w/ housework x61mo & stress makes the SOB worse, been using her Symbicort & Spiriva just "as needed" she says; DrStoneking got her a nebulizer w/ Xopenex to use TID but she has run out of this med...       In the course of our conversations Kiki tells me that she was prev diagnosed w/ Lupus by DrGates and DrLevitin years ago (1997) but we do not have any old data from this period (note from [place] 02/01/15 just mentioned lung nodules and PMHx has no mention of SLE, prev abn blood tests, prev Rheum eval, etc);  The pt volunteers that she had a pos ANA & was treated by DrLevitin w/ Pred intermittently over a 3 yr period;  ?why she stopped going & it was never mentioned again... She does admit to hx arthritis, hurting all over (esp knees), and she had arthroscopic knee surg by [facility] 10/26/14 "he scraped it out"; she had post op therapy  But she noted her breathing was worse...   Smoking Hx>  she is an ex-smoker, starting at age 3 & smoked for about 50 yrs up to 1.5ppd, & quit in 2003; est ~50-60 pack-yr smoking hx...  Pulmonary Hx>  She reports hx diphtheria as child, had lots of recurrent bronchitic infections in her adult life, she reports several bouts of pneumonia & wonders if these infections left her w/ scar tissue, seen by DrClance 2013 w/ remote hx asthma/ now modCOPD treated w/ Symbicort, Spriva, PPI & antireflux regimen;  ?she had sleep eval 2013 & was started on CPAP (?[facility], no records avail)...  Medical  Hx>  DrStoneking- HL, Obesity, GERD, Divertics, colon polyps, liver AVM, osteopenia, migraines, RLS, anxiety/depression...  Family Hx>  Hx asthma in her mother, otherw no FamHx lung problems...   Occup Hx>  No known occupational exposures- asbestos, chemical toxins, no recent travel or birds...  Current Meds>  Symbicort160, Spiriva, Xopenex neb, Mucinex, Claritin10, ASA81, Lisinopril5, Fish Oil, Prilosec20-2Bid, Celexa40, calcium/ vitamins... EXAM - Afeb, VSS, O2sat=92% on RA;  Obese at 251#, 5'7"Tall, BMI=39;  HEENT- neg, mallampati2;  Chest- decr BS bilat, few basilar rales, no wheezing or consolidation;  Heart- RR gr1-2 SEM w/o r/g;  Abd- obese soft neg;  Ext- w/o c/c/e  CT Chest 01/26/15 by [facility]- report by Dr. Anabel Halon- indicates extensive bilat pulm fibrosis, mult pulm masses & nodules- neoplasia cannot be excluded, mult nodes in mediastinum- mostly wnl in size... To my review this shows widespread patchy ground glass opac superimposed on her prev emphysema changes & mult small pulm nodules (now largely obscured by this interstitial process);  I am concerned this new ILD may represent COP.   CXR 02/06/15 showed norm heart size, lungs appear hyperinflated w/ coarsened interstitial markings bilat & an opacity at left lung base (all this new compared to last CXR 2012)...  Spirometry 02/06/15 showed FVC=1.97 (62%), FEV1=1.40 (60%), %1sec=71%, mid-flows are reduced at 49% predicted;  This is suggestive of mixed mild obstructive & mod restrictive dis...  Ambulatory oxygen saturation test 01/2015> O2sat=92% on RA at rest w/ pulse=95/min;  She ambulated in office only 1 lap w/ lowest O2sat=88% w/ pulse=124/min...  LABS 01/2015>  Chems- wnl;  CBC- wnl x WBC=19.5;  TSH=6.41;  Collagen-vasc screen:  ACE=10 (nl<52), RheumFactor=100 (Hi pos >42), Anti-CCP=213 (strong pos >59), ANA pos 1:320 homogeneous pattern, ANCA screen= NEG (MPO & PR-3), Sed=54, CRP=4.5.Marland KitchenMarland Kitchen IMP/PLAN >>       New ILD per  CXR & CT Chest- c/w COP/BOOP>  The totality of the eval including labs looks like this could be Rheumatoid lung dis; we will refer her to [facility] ASAP & start Pred rx...      Underlying COPD/emphysema, former smoker>  She is on Symbicort160-2spBid, Spiriva daily, Xopenex nebs Tid, Mucinex and rec to take meds regularly, NOT prn...      Hx mult small pulmonary nodules seen on prev CT scans in 2013>  This will need to be followed w/ serial CXR/ CT scans...      OSA- eval & Rx per [facility], no data avail in Epic>  Per DrStoneking & Maxwell Caul...      GERD/ prob LPR>  She needs a vigorous antireflux regimen w/ PPI Bid, NPO after dinner, elev HOB 6"; DrMJohnson is her GI...      Abn collagen-vasc screen w/ +RA, +Anti-CCP, +ANA, Sed54>  She was prev treated by DrLevitin for Lupus 1997-2000, labs look like RA & we will refer pt to [facility] for Rheum eval at this time...  ~  March 14, 2015:  4mo ROV w/ SN>        At the time of our initial visit we started PRED40 x2wks=> 20 x2wks w/ f/u appt 65mo;  She saw [facility] for Rheum in the interval but we don't have note from her to review;  Pt tells me that she has Hx OA w/ prev knee arthroscopy by DrCaffrey & presist w/ lots of knee pain, but she claims that while she was taking the Pred20 she suddenly developed severe pain & swelling in her hands, wrists and ankles=> called DrHawkes who wanted to get her off the Pred & weaned her down further to 10mg /d- recall that we started the Pred for prob Rheumatoid Lung w/ CT scan suggesting COP/BOOP pattern;  She further indicates to me that she fell out of bed 2wks ago- bruised leg & arm notes it's hard to walk w/ pain in feet & legs;  She called DStoneking's office & was switched from Aleve to Tylenol;  It got so bad she went to the ER yest=> given Tramadol & told to f/u here;  She has a follow up appt w/ [facility] tomorrow 03/15/15...      From the pulmonary standpoint she is about the same, but the pain in her hands/  wrist has been her main complaint for the last week or so;  She remains on Symbicort160-2spBid, Spiriva daily, NEBS w/ Xopenex tid, Mucinex prn;  She has had trouble doing the inhalers since her hands &wrist swelled up, pain w/ actuation;  She is SOB & O2sat=90% on RA today in office;  Notes sl dry cough, no sput, no hemoptysis, no CP...       EXAM shows Afeb, VSS, O2sat=90% on RA;  HEENT- neg, mallampati2;  Chest- decr BS bilat, few basilar rales, no wheezing or consolidation;  Heart- RR gr1-2 SEM w/o r/g;  Abd- obese soft neg;  Ext- swollen/ tender wrists & hands!  CXR 03/13/15 in ER> mild cardiomeg, COPD/ hyperinflation, prominent lung markings bilat, no acute changes...   EKG 03/13/15 in ER> sinus tachy, rate 106/min, NSSTTWA, NAD...  Ambulatory O2sat test 03/14/15> O2sat=91% on RA at rest w/ pulse=86/min; on standing w/ min walk O2sat dropped to 85% w/ pulse=106/min... IMP >>      New ILD per CXR & CT Chest- c/w COP/BOOP, prob related to Reumatoid Lung>  We started Pred 40=>20 but this was further cut to 10mg /d by [facility] in the interval; pulm status is clearly no better, ?sl worse, and she needs HOME O2 to be started rec 1-2L/min rest & 2L/min exercise => hypoxemic resp failure...      Underlying COPD/emphysema, former smoker>  She is on Symbicort160-2spBid, Spiriva daily, Xopenex nebs Tid, Mucinex and rec to take meds regularly everyday...      Hx mult small pulmonary nodules seen on prev CT scans in 2015> recent CT 01/2015 showed these nodules were masked by the ground glass opac & interstitial process; this will need to be followed w/ serial CXR/ CT scans...      OSA- eval & Rx per [facility], no data avail in Epic>  Per DrStoneking & Maxwell Caul...      GERD/ prob LPR>  She needs a vigorous antireflux regimen w/ PPI Bid, NPO after dinner, elev HOB 6"; DrMJohnson is her GI...      Abn collagen-vasc screen w/ +RA, +Anti-CCP, +ANA, Sed54> Looks like RA-  She was prev treated by DrLevitin for  Lupus 1997-2000;  She was referred to [facility] for Rheum & her note is pending... PLAN >>       She is asked to keep the Pred at 10mg  per day for now due to her pulmonary process;  She has ROV tomorrow w/ Rheum for follow up & to eval her acute hand, wrist, foot arthritic process;  We are starting HOME O2 due to her acute hypoxemic resp failure... We will follow up in 54month, sooner if needed. ADDENDUM>> Pt seen by [facility] 11/30-- seropos RA, OA of both knees, ILD;  Insurance won't cover TNFs therefore started on ARAVA20 and Pred increased to 20mg /d... ADDENDUM>> Full PFTs 03/30/15:  FVC=2.23 (73%), FEV1=1.50 (65%), %1sec=68, mid-flows reduced at 51% predicted; post bronchodil the FEV1 improved 6%; TLC=4.13 (77%), RV=1.80 (75%), RV/TLC=44; DLCO=50% predicted;  These PFTs are compatible w/ mild restriction and a superimposed mild obstructive defect w/ a mod-severe reduction is DLCO...  ~  May 24, 2015:  58mo ROV w/ SN>  Brennan notes that he breathing is improved w/ her home O2 therapy but she would like an Inogen One POC & we will request this from her DME;  She notes SOB/DOE stable- no progression, and mild dry cough w/o sput, no CP etc... She has remained on Pred per [facility], no recent notes scanned into Epic; on Pred20/d + Arava and she is checked Q59mo w/ slow Pred taper...      ILD/ pulm fibrosis per CXR & CT Chest- c/w COP/BOOP, prob related to Reumatoid Lung>  On Pred & Arava per DrHawkes, Rheum w/ freq OVs and slow taper of the Pred.Marland KitchenMarland Kitchen  Underlying COPD/emphysema, former smoker>  on Symbicort160-2spBid, Spiriva daily, Xopenex nebs Tid, Mucinex and rec to take meds regularly everyday...      Hypoxemic resp failure>  On Home O2 at 2L/min w/ O2sat=97% today in office; she is requesting a smaller POC- wants the Inogen One...      Hx mult small pulmonary nodules seen on prev CT scans in 2015> recent CT 01/2015 at [place] ([facility]) showed these nodules were masked by the ground glass opac &  interstitial process; CXR 11/16 w/ incr interstitial markings.      OSA- eval & Rx per [facility], no data avail in Epic>  per DrStoneking & Maxwell Caul...      GERD/ prob LPR>  She needs a vigorous antireflux regimen w/ PPI (Prilosec40) Bid, NPO after dinner, elev HOB 6"; DrMJohnson is her GI...      Abn collagen-vasc screen w/ +RA, +Anti-CCP, +ANA, Sed54> Looks like RA-  She was prev treated by DrLevitin for Lupus 1997-2000;  She saw [facility] 02/2015 & started on ARAVA while slowly weaning Pred. EXAM shows Afeb, VSS, O2sat=97% on 2L/min pulse dose;  Wt=267#, 5'7"Tall, BMI=40;  HEENT- neg, mallampati2;  Chest- decr BS bilat, few basilar rales, no wheezing or consolidation;  Heart- RR gr1-2 SEM w/o r/g;  Abd- obese soft neg;  Ext- swollen/ sl tender wrists & hands! IMP/PLAN>>  She is slowly weaning the Pred per Rheum & taking the Arava20;  We will request a POC device for her;  We plan ROV in 79mo w/ f/u CXR/ CT Chest...  ~  July 24, 2015:  41mo ROV and Melvie notes that her breathing is stable> she is an ex-smoker w/ mixed obstructive (COPD/emphysema) & restrictive (ILD w/ prob RA and obesity) lung dis, chronic hypoxemic resp failure on HomeO2;  She has been using her Symbocort160-2spBid, Spiriva daily, Mucinex600- one tab daily; she has not been using her nebulizer and notes that her breathing is good;  She is followed by DrHawkes Q40mo on Arava & slow Pred taper- currently on 10mg /d...      ILD/ pulm fibrosis per CXR & CT Chest- c/w COP/BOOP, prob related to Reumatoid Lung>  On Pred (currently 10mg /d) & Arava per DrHawkes, Rheum w/ freq OVs and slow taper of the Pred...      Underlying COPD/emphysema, former smoker>  on Symbicort160-2spBid, Spiriva daily, Xopenex nebs Tid (only using this prn), Mucinex and rec to take meds regularly everyday...      HEx-induced hypoxemic resp failure>  On Home O2 at 2L/min w/ O2sat=97% on 2L in office today on her new "simply go" POC...       Hx mult small pulmonary  nodules seen on prev CT scans in 2015> CT 01/2015 at [place] ([facility]) showed these nodules were masked by the ground glass opac & interstitial process; CXR 11/16 w/ incr interstitial markings.      OSA- eval & Rx per [facility], no data avail in Epic>  per DrStoneking & Maxwell Caul...      GERD/ prob LPR>  She needs a vigorous antireflux regimen w/ PPI (Prilosec40) Bid, NPO after dinner, elev HOB 6"; DrMJohnson is her GI...      Abn collagen-vasc screen w/ +RA, +Anti-CCP, +ANA, Sed54> Looks like RA-  She was prev treated by DrLevitin for Lupus 1997-2000;  She saw [facility] 02/2015 & started on ARAVA while slowly weaning Pred. EXAM shows Afeb, VSS, O2sat=97% on 2L/min pulse dose;  Wt=267#, 5'7"Tall, BMI=42;  HEENT- neg, mallampati2;  Chest- decr BS bilat,  few basilar rales, no wheezing or consolidation;  Heart- RR gr1-2 SEM w/o r/g;  Abd- obese soft neg;  Ext- much improved wrists & hands  CT Chest => done 07/28/15> norm heart size, atherosclerotic changes in Ao & LAD, mod centrilobular & paraseptal emphysema bilaterally, mult pulmonary nodules are unchanged from 06/2013 scan and felt c/w benign process, NO MENTION MADE OF INTERSTITIAL PROCESS SEEN ON CT CHEST 01/26/15 from [facility] => now largely resolved...    IMP/PLAN>>  Ceara's breathing is stable, at her new baseline & we will proceed w/ new CT Chest to compare to the old; slow Pred taper per rheum and she desperately needs to get on diet & get weight down; continue O2, Symbicort, Spiriva, etc...  ~  January 22, 2016:  62mo ROV w/ SN>  Aleisha reports an acute exac of her COPD starting several days ago w/ cough, greenish sput production, mod chest congestion, no hemoptysis, sl incr SOB but no CP/ palpait/ edema; she remains on Home O2 at 1-2L/min, NEBS w/ Xopenex Tid, Symbicort160-2spBid, Spiriva daily; Her Pred was boosted to 12.5mg /d in August by [facility] for an RA flair at that time & she continues on the [place] daily;  We discussed treatment for  this AB exac w/ Levaquin + probiotic...       ILD/ pulm fibrosis per CXRs & prev CT Chest- c/w COP/BOOP, prob related to Reumatoid Lung>  On Pred (currently 12.5mg /d per DrHawkes) & Arava, Rheum w/ freq OVs and slow taper of the Pred planned...      Underlying mixed COPD w/ emphysema & chr bronchitis, former smoker>  on Symbicort160-2spBid, Spiriva daily, Xopenex nebs Tid (only using this prn), Mucinex and rec to take meds regularly everyday...      Hx ex-induced hypoxemic resp failure>  On Home O2 at 2L/min w/ O2sat=97% on 2L in office today on her new "simply go" POC...       Hx mult small pulmonary nodules seen on prev CT scans in 2015> CT 01/2015 at [place] ([facility]) showed these nodules were masked by the ground glass opac & interstitial process; CXR 11/16 w/ incr interstitial markings; f/u CT 07/2015 showed stable pulm nodules and resolution of the interstitial process prev seen...      OSA- eval & Rx per [facility], no data avail in Epic>  per DrStoneking & Maxwell Caul...      Obesity>  Dillan weighs ~270#, 5'7"Tall, BMI=42; she has been counseled on DIET/ EXERCISE/ wt reducing strategies...      GERD/ prob LPR>  She needs a vigorous antireflux regimen w/ PPI (Prilosec40) Bid, NPO after dinner, elev HOB 6"; DrMJohnson is her GI...      Abn collagen-vasc screen w/ +RA, +Anti-CCP, +ANA, Sed54> +RA-  She was prev treated by DrLevitin for Lupus 1997-2000;  She saw [facility] 02/2015 & started on ARAVA while slowly weaning Pred. EXAM shows Afeb, VSS, O2sat=100% on 1L/min pulse dose;  Wt=268#, 5'7"Tall, BMI=40;  HEENT- neg, mallampati2;  Chest- decr BS bilat, few basilar rales, no wheezing or consolidation;  Heart- RR gr1-2 SEM w/o r/g;  Abd- obese soft neg;  Ext- much improved wrists & hands IMP/PLAN>>  I placed Lakenya on Levaquin for this bronchitic flair & rec that she take a probiotic like Align while on the antibiotic;  She will continue her O2, Prednisone, Symbicort, Spiriva, & Mucinex;  Reminded  about the recommended antireflux regimen;  Also reminded about diet/ exercise & wt reduction strategies...  ~  July 22, 2016:  37mo ROV & pulmonary recheck> Her PCP is DrStoneking, & Rheum- DrHawkes=> outpt rehab for difficullty walking...  She states her breathing is good, no change; notes incr SOB w/ activities but ADLs are still ok (eg-  Vacuums and takes her time, but stairs are bad)... On O2 at 1L/min rest & 2L/min activity; current Pred taper, Symbicort160-2spBid, Spiriva once daily,  XOPENEX NEBS but not using Tid- just does this Prn she says & "hasn't needed";  She has Mucinex & Claritin for as needed use too;  She remains on ARAVA per [facility]...    EXAM shows Afeb, VSS, O2sat=96% on 1L/min pulse dose;  Wt=276#, 5'7"Tall, BMI=43;  HEENT- neg, mallampati2;  Chest- decr BS bilat, few basilar rales, no wheezing or consolidation;  Heart- RR gr1-2 SEM w/o r/g;  Abd- obese soft neg;  Ext- much improved wrists & hands.  CXR 07/22/16>  Norm heart size, hyperinflation c/w COPD, mild chr interstitial thickening- no focal abn & NAD...  IMP/PLAN>>  Sharlene appears stable but must desperately lose the weight!  She requests a Rx for [facility] to keep on hand & we discussed this- must DIET/ EXERCISE/ get wt down! Continue same meds...   ~  November 12, 2016:  49mo ROV & Denese called for an acute visit- add on appt due to a 4d hx CP & SOB, notes substernal CP that is incr w/ a deep breath/ movement/ & thru to her back; her SOB is a sensation of not getting a deep breath;  I note that her weight is down 9# to 267# today & she is watching her intake but still not exercising; notes mild cough, small amt clear sput, no hemoptysis, no f/c/s, no edema etc... She remains on Rx w/ O2 at 2L/min w/ activ & 1L/min rest; NEBs w/ Xopenex Tid; Symbicort160-2spBid & Spiriva once daily; Mucinex600 once daily; Levaquin for prn use;  She remains on Pred7.5mg /d, Plaquenil200Bid, & Arava per [facility] (last note 07/09/16);  She remains on  Alendronate70, [place] per DrStoneking, but off Lipitor due to leg cramps...  We discussed further eval w/ f/u CXR, EKG, ambulatory oximetry today>>    EXAM shows Afeb, VSS, O2sat=95% on 1L/min pulse dose;  Wt=267#, 5'7"Tall, BMI=42;  HEENT- neg, mallampati2;  Chest- decr BS bilat, few basilar rales, no wheezing or consolidation;  Heart- RR gr1-2 SEM w/o r/g;  Abd- obese soft neg;  Ext- much improved wrists & hands.  CXR 11/12/16 (independently reviewed by me in the PACS system) showed heart at upper lim of norm, hyperinflation c/w COPD, chr interstitial prominence (stable)- NAD.Marland KitchenMarland Kitchen   EKG 11/12/16 shows NSR, rate75, wnl...  Ambulatory Oximetry 11/12/16 on 1L/min showed O2sat=94% at rest w/ pulse=85/min;  She ambulated only 2 Laps (185'ea) on 1L/min w/ lowest O2sat=89% w/ heart rate= 95/min;  She could not go further due to leg fatigue...  IMP/PLAN>>  Jennette seems to have developed some CWP/ inflamm & has assoc SOB/DOE fom this & chest wall factors- her COPD, ?rheumatoid interstitial dis, etc- all appears stable;  She is rec to REST, apply HEAT, & use Tramadol50 + Tylenol for the pain;  She will continue her current breathing meds regularly & over the longer term must get on diet, incr exercise, & must lose weight... She will cll for any worsening symptoms and f/u w/ [place] in 2-49mo...    Past Medical History:  Diagnosis Date  . Allergic rhinitis   . Asthmatic bronchitis   . Colon polyps   . COPD (chronic obstructive  pulmonary disease) ([place])   . Diverticulosis   . GERD (gastroesophageal reflux disease)   . H/O: GI bleed   . Hypercholesterolemia >> PCP is DrStoneking   . Migraine headache   . Obesity   . RLS (restless legs syndrome)    Osteoarthritis    RHEUMATOID ARTHRITIS >> followed by [facility]     Past Surgical History:  Procedure Laterality Date  . TONSILLECTOMY  x2  . TOTAL ABDOMINAL HYSTERECTOMY  02/17/1989    Outpatient Encounter Prescriptions as of 11/12/2016  Medication  Sig  . acetaminophen (TYLENOL) 500 MG tablet Take 1,000 mg by mouth every 6 (six) hours as needed for moderate pain.  . Ascorbic Acid (VITAMIN C) 1000 MG tablet Take 1,000 mg by mouth daily.  Marland Kitchen aspirin 81 MG tablet Take 81 mg by mouth every other day.  . budesonide-formoterol (SYMBICORT) 160-4.5 MCG/ACT inhaler Inhale 2 puffs into the lungs 2 (two) times daily.  . Calcium Carbonate-Vitamin D (CALCIUM + D) 600-200 MG-UNIT TABS Take 1 tablet by mouth daily.    . citalopram (CELEXA) 40 MG tablet Take 40 mg by mouth daily.  Marland Kitchen guaiFENesin (MUCINEX) 600 MG 12 hr tablet Take 600 mg by mouth daily.   . Leflunomide (ARAVA PO) Take by mouth daily.  Marland Kitchen levalbuterol (XOPENEX) 1.25 MG/3ML nebulizer solution Take 1.25 mg by nebulization 3 (three) times daily. (Patient taking differently: Take 1.25 mg by nebulization 3 (three) times daily as needed. )  . levofloxacin (LEVAQUIN) 500 MG tablet Take 1 tablet (500 mg total) by mouth daily.  Marland Kitchen lisinopril (PRINIVIL,ZESTRIL) 5 MG tablet Take 5 mg by mouth daily.   Marland Kitchen loratadine (CLARITIN) 10 MG tablet Take 10 mg by mouth daily.    . Omega-3 Fatty Acids (FISH OIL) 1200 MG CAPS Take 1 capsule by mouth daily.    Marland Kitchen omeprazole (PRILOSEC) 40 MG capsule Take 1 capsule by mouth 2 (two) times daily.  . OXYGEN Inhale into the lungs. 2 lpm with rest and exertion  . predniSONE (DELTASONE) 10 MG tablet Take 10 mg by mouth daily with breakfast.   . Tiotropium Bromide Monohydrate (SPIRIVA RESPIMAT) 1.25 MCG/ACT AERS Inhale 2 puffs into the lungs daily.  . traMADol (ULTRAM) 50 MG tablet Take 1/2 to 1 tablet TID with Extra Strength Tylenol  . valACYclovir (VALTREX) 1000 MG tablet Take 1,000 mg by mouth daily as needed (for breakouts).   . [DISCONTINUED] traMADol (ULTRAM) 50 MG tablet Take 1 tablet (50 mg total) by mouth every 6 (six) hours as needed.  . Tiotropium Bromide Monohydrate (SPIRIVA RESPIMAT) 1.25 MCG/ACT AERS Inhale 2 puffs into the lungs daily.  . [DISCONTINUED]  atorvastatin (LIPITOR) 10 MG tablet Take 10 mg by mouth daily.   No facility-administered encounter medications on file as of 11/12/2016.     Allergies  Allergen Reactions  . Shellfish Allergy Anaphylaxis    With vomiting and diarrhea  . Macrolides And Ketolides   . Penicillins     Facial numbness  . Sertraline Hcl   . Sulfa Antibiotics     Facial numbness    Immunization History  Administered Date(s) Administered  . Influenza, High Dose Seasonal PF 01/17/2016  . Influenza-Unspecified 12/30/2014  . Pneumococcal Conjugate-13 07/07/2013  . Pneumococcal Polysaccharide-23 03/03/2009    Current Medications, Allergies, Past Medical History, Past Surgical History, Family History, and Social History were reviewed in [employer] record.   Review of Systems             All symptoms NEG  except where BOLDED >>  Constitutional:  F/C/S, fatigue, anorexia, unexpected weight change. HEENT:  HA, visual changes, hearing loss, earache, nasal symptoms, sore throat, mouth sores, hoarseness. Resp:  cough, sputum, hemoptysis; SOB, tightness, wheezing. Cardio:  CP, palpit, DOE, orthopnea, edema. GI:  N/V/D/C, blood in stool; reflux, abd pain, distention, gas. GU:  dysuria, freq, urgency, hematuria, flank pain, voiding difficulty. MS:  joint pain, swelling, tenderness, decr ROM; neck pain, back pain, etc. Neuro:  HA, tremors, seizures, dizziness, syncope, weakness, numbness, gait abn. Skin:  suspicious lesions or skin rash. Heme:  adenopathy, bruising, bleeding. Psyche:  confusion, agitation, sleep disturbance, hallucinations, anxiety, depression suicidal.   Objective:   Physical Exam       Vital Signs:  Reviewed...   General:  WD, obese, 76 y/o WF in NAD; alert & oriented; pleasant & cooperative... HEENT:  [place]/AT; Conjunctiva- pink, Sclera- nonicteric, EOM-wnl, PERRLA, EACs-clear, TMs-wnl; NOSE-clear; THROAT-clear & wnl.  Neck:  Supple w/ fair ROM; no JVD; normal  carotid impulses w/o bruits; no thyromegaly or nodules palpated; no lymphadenopathy.  Chest:  Decr BS bilat, few basilar rales, no wheezing rhonchi or signs of consolidation... Heart:  Regular Rhythm; norm S1 & S2 gr1-2/6 SEM, w/o rubs or gallops detected. Abdomen:  Soft & nontender- no guarding or rebound; normal bowel sounds; no organomegaly or masses palpated. Ext:  decrROM; without deformities +arthritic changes; no varicose veins, +venous insuffic, tr edema;  Pulses intact w/o bruits. Neuro:  No focal neuro deficits; sensory testing normal; gait OK & balance OK. Derm:  No lesions noted; no rash etc. Lymph:  No cervical, supraclavicular, axillary, or inguinal adenopathy palpated.   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      Underlying COPD/emphysema, former smoker>  on Symbicort160-2spBid, Spiriva daily, Xopenex nebs Tid (only using this prn), Mucinex and rec to take meds regularly everyday...      Hypoxemic resp failure>  On Home O2 at 2L/min w/ O2sat=97% on 2L in office today on her new "simply go" POC...       Hx mult small pulmonary nodules seen on prev CT scans in 2015> CT 01/2015 at [place] ([facility]) showed these nodules were masked by the ground glass opac & interstitial process; CXR 11/16 w/ incr interstitial markings.      OSA- eval & Rx per [facility], no data avail in Epic>  per DrStoneking & Maxwell Caul...      GERD/ prob LPR>  She needs a vigorous antireflux regimen w/ PPI (Prilosec40) Bid, NPO after dinner, elev HOB 6"; DrMJohnson is her GI...      Abn collagen-vasc screen w/ +RA, +Anti-CCP, +ANA, Sed54> Looks like RA-  She was prev treated by DrLevitin for Lupus 1997-2000;  She saw [facility]/2015 & started on ARAVA while slowly weaning Pred.  PLAN >>  02/10/15:  Start Pred 20mg  tabs- 40mg  Qam  for 2 wks, then 20mg /d til return;  Refer to [place], ASAP;  Continue other meds reglarly (Symbicort, Spiriva, Xopenex, Mucinex);  Needs vigorous antireflux regimen... 03/14/15:  She is asked to keep the Pred at 10mg  per day for now due to her pulmonary process;  She has ROV tomorrow w/ Rheum for follow up & to eval her acute hand, wrist, foot arthritic process;  We are  starting HOME O2 due to her acute hypoxemic resp failure...  05/24/15:   She has established w/ DrHawkes for Rheum on slow Pred taper + Arava;  Breathing is stable on O2, Pred, XopenexTid, Symbicort160, Spiriva, and Mucinex... 07/24/15>   Delynda's breathing is stable, at her new baseline & we will proceed w/ new CT Chest to compare to the old; slow Pred taper per rheum and she desperately needs to get on diet & get weight down; continue O2, Symbicort, Spiriva, etc... 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 She will cll for any worsening symptoms and f/u w/ [place] in 2-27mo.     Plan:                                      HOME O2 at 1-2L/min at rest and 2L/min w/ activity... Patient's Medications  New Prescriptions   No medications on file  Previous Medications   ACETAMINOPHEN (TYLENOL) 500 MG TABLET    Take 1,000 mg by mouth every 6 (six) hours as needed for moderate pain.   ASCORBIC ACID (VITAMIN  C) 1000 MG TABLET    Take 1,000 mg by mouth daily.   ASPIRIN 81 MG TABLET    Take 81 mg by mouth every other day.   BUDESONIDE-FORMOTEROL (SYMBICORT) 160-4.5 MCG/ACT INHALER    Inhale 2 puffs into the lungs 2 (two) times daily.   CALCIUM CARBONATE-VITAMIN D (CALCIUM + D) 600-200 MG-UNIT TABS    Take 1 tablet by mouth daily.     CITALOPRAM (CELEXA) 40 MG TABLET    Take 40 mg by mouth daily.   GUAIFENESIN (MUCINEX) 600 MG 12 HR TABLET    Take 600 mg by mouth daily.    LEFLUNOMIDE (ARAVA PO)    Take by mouth daily.   LEVALBUTEROL (XOPENEX) 1.25 MG/3ML NEBULIZER SOLUTION    Take 1.25 mg by nebulization 3 (three) times daily.   LEVOFLOXACIN (LEVAQUIN) 500 MG TABLET    Take 1 tablet (500 mg total) by mouth daily.   LISINOPRIL (PRINIVIL,ZESTRIL) 5 MG TABLET    Take 5 mg by mouth daily.    LORATADINE (CLARITIN) 10 MG TABLET    Take 10 mg by mouth daily.     OMEGA-3 FATTY ACIDS (FISH OIL) 1200 MG CAPS    Take 1 capsule by mouth daily.     OMEPRAZOLE (PRILOSEC) 40 MG CAPSULE    Take 1 capsule by mouth 2 (two) times daily.   OXYGEN    Inhale into the lungs. 2 lpm with rest and exertion   PREDNISONE (DELTASONE) 10 MG TABLET    Take 10 mg by mouth daily with breakfast.    TIOTROPIUM BROMIDE MONOHYDRATE (SPIRIVA RESPIMAT) 1.25 MCG/ACT AERS    Inhale 2  puffs into the lungs daily.   TIOTROPIUM BROMIDE MONOHYDRATE (SPIRIVA RESPIMAT) 1.25 MCG/ACT AERS    Inhale 2 puffs into the lungs daily.   VALACYCLOVIR (VALTREX) 1000 MG TABLET    Take 1,000 mg by mouth daily as needed (for breakouts).   Modified Medications   Modified Medication Previous Medication   TRAMADOL (ULTRAM) 50 MG TABLET traMADol (ULTRAM) 50 MG tablet      Take 1/2 to 1 tablet TID with Extra Strength Tylenol    Take 1 tablet (50 mg total) by mouth every 6 (six) hours as needed.  Discontinued Medications   ATORVASTATIN (LIPITOR) 10 MG TABLET    Take 10 mg by mouth daily.

## 2016-12-12 DIAGNOSIS — M05741 Rheumatoid arthritis with rheumatoid factor of right hand without organ or systems involvement: Secondary | ICD-10-CM | POA: Diagnosis not present

## 2016-12-12 DIAGNOSIS — R0603 Acute respiratory distress: Secondary | ICD-10-CM | POA: Diagnosis not present

## 2016-12-12 DIAGNOSIS — J849 Interstitial pulmonary disease, unspecified: Secondary | ICD-10-CM | POA: Diagnosis not present

## 2016-12-12 DIAGNOSIS — J449 Chronic obstructive pulmonary disease, unspecified: Secondary | ICD-10-CM | POA: Diagnosis not present

## 2016-12-12 DIAGNOSIS — M05742 Rheumatoid arthritis with rheumatoid factor of left hand without organ or systems involvement: Secondary | ICD-10-CM | POA: Diagnosis not present

## 2016-12-12 DIAGNOSIS — J439 Emphysema, unspecified: Secondary | ICD-10-CM | POA: Diagnosis not present

## 2016-12-12 DIAGNOSIS — G4733 Obstructive sleep apnea (adult) (pediatric): Secondary | ICD-10-CM | POA: Diagnosis not present

## 2016-12-15 IMAGING — CT CT CHEST W/O CM
3 of 4 series · 17 of 30 positions shown, 19 images · non-contrast
Comparison: 02/02/2014

CLINICAL DATA: Followup pulmonary nodules

EXAM:
CT CHEST WITHOUT CONTRAST
TECHNIQUE: Multidetector CT imaging of the chest was performed following the
standard protocol without IV contrast.

[Series 3: chest w/o · axial · non-contrast · 0.70mm/px · z∈[-256,-14]mm · 7 of 131 slices shown, 9 images]
[im 17/131  mediastinal]
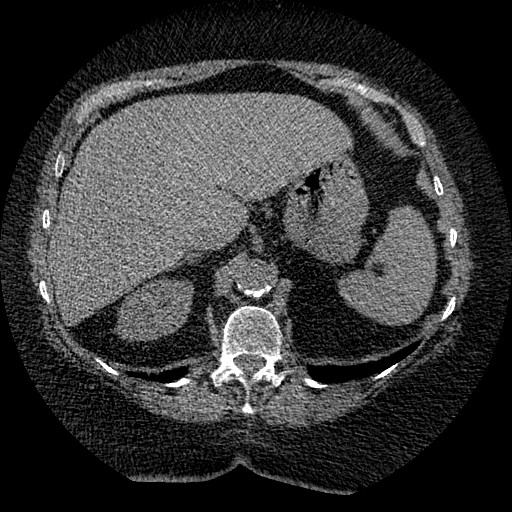
[im 17/131  lung]
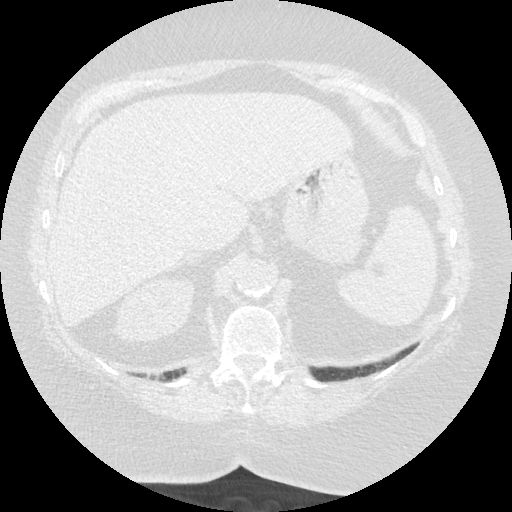
[im 33/131  lung]
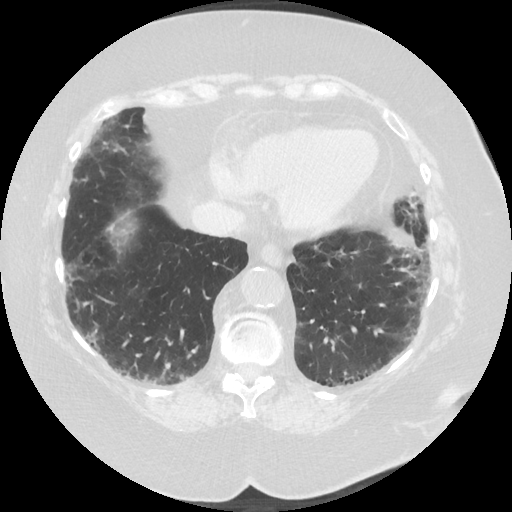
[im 49/131  lung]
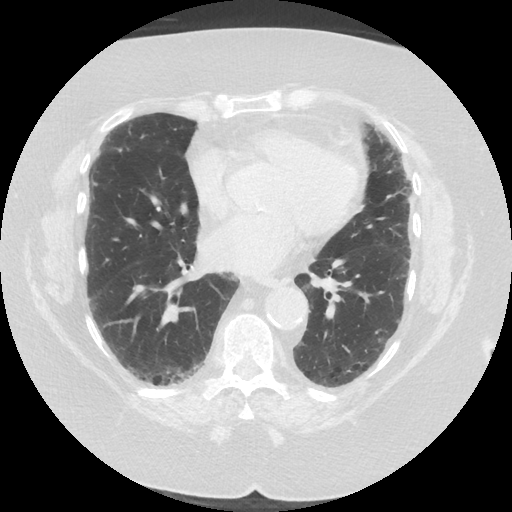
[im 66/131  lung]
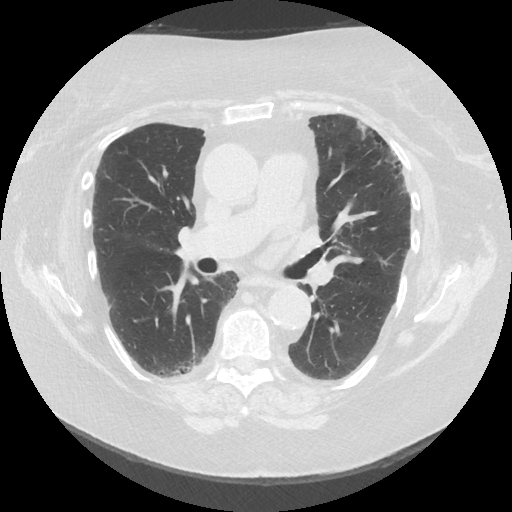
[im 82/131  mediastinal]
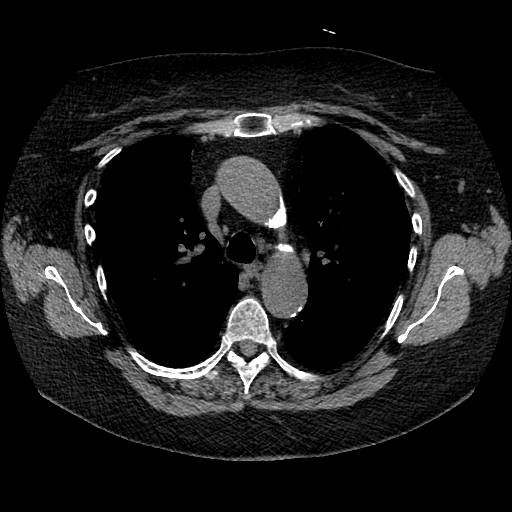
[im 82/131  lung]
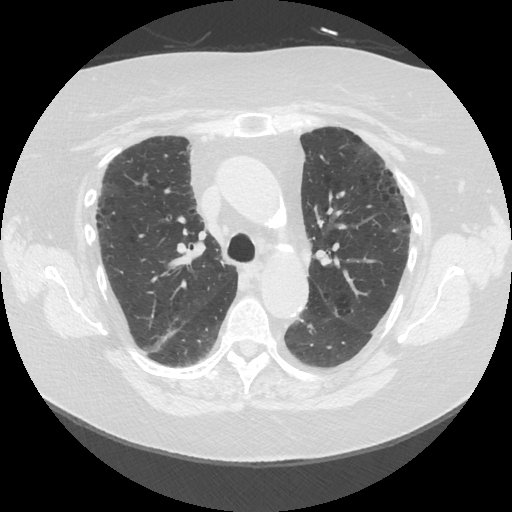
[im 98/131  lung]
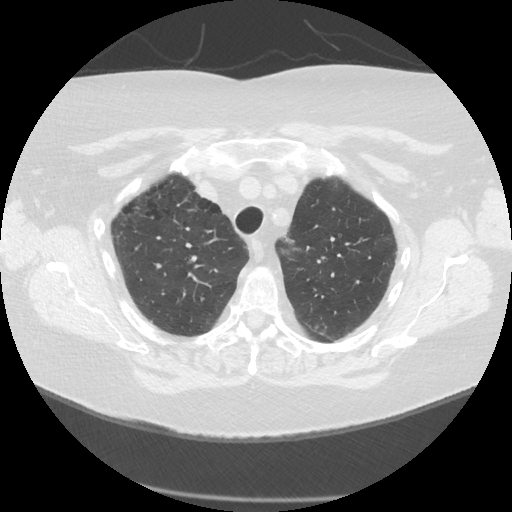
[im 114/131  lung]
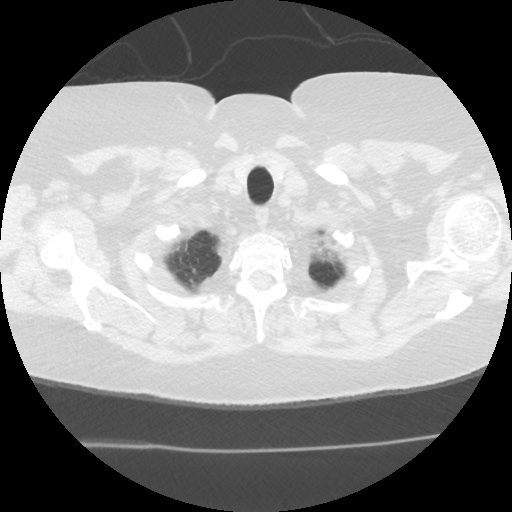

[Series 4: lung windows · axial · 0.70mm/px · z∈[-256,-14]mm · 7 of 131 slices shown]
[im 17/131  lung]
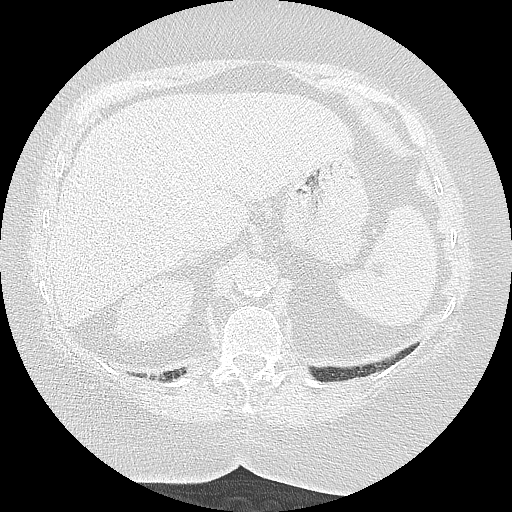
[im 33/131  lung]
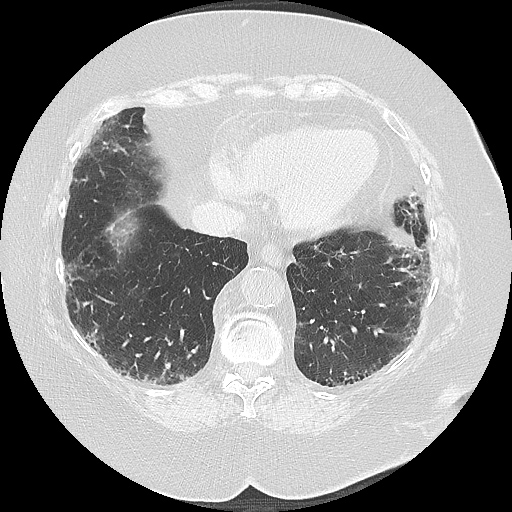
[im 49/131  lung]
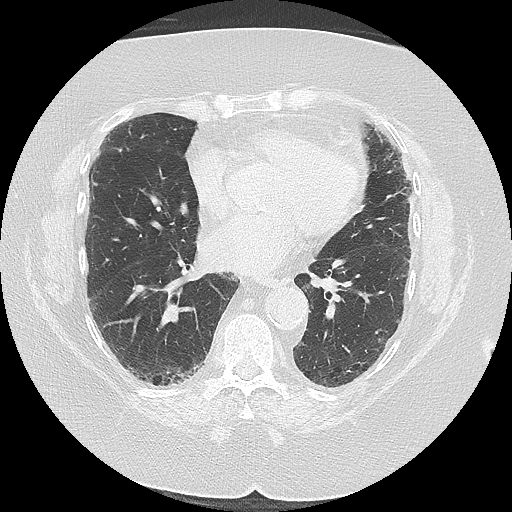
[im 66/131  lung]
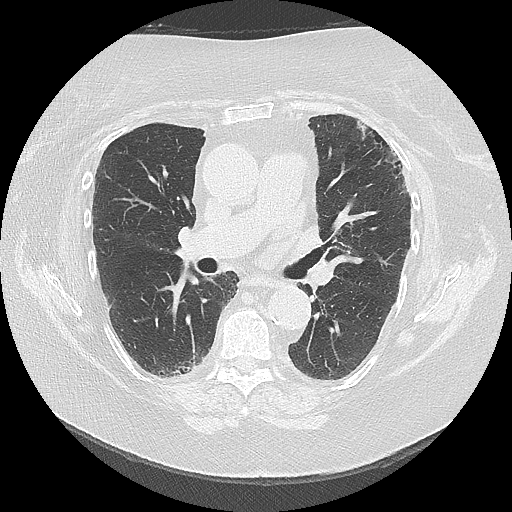
[im 82/131  lung]
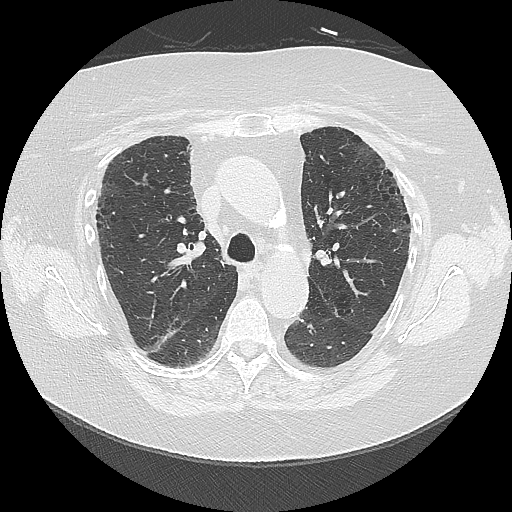
[im 98/131  lung]
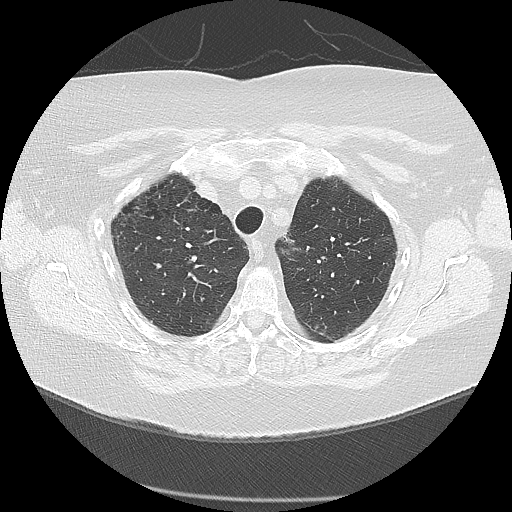
[im 114/131  lung]
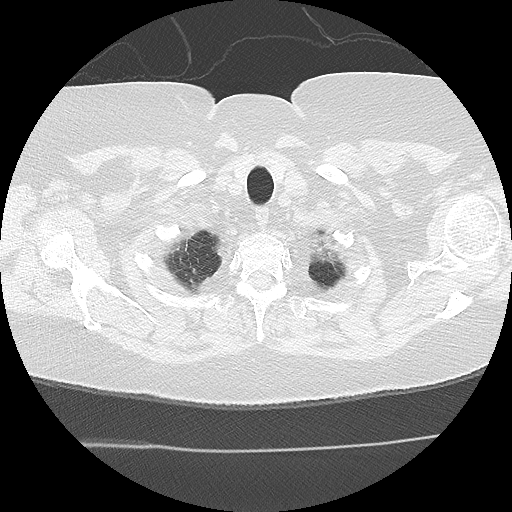

[Series 602: sagittal body · sagittal · 0.70mm/px · 3 of 145 slices shown]
[im 17/145  mediastinal]
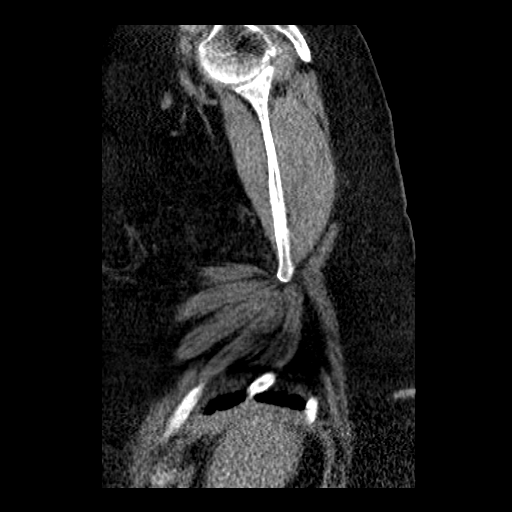
[im 33/145  mediastinal]
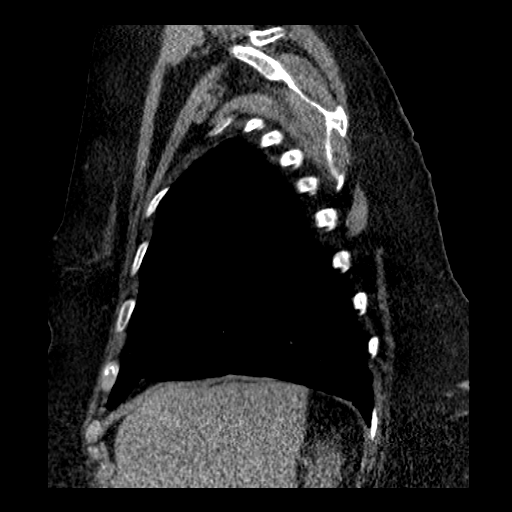
[im 49/145  mediastinal]
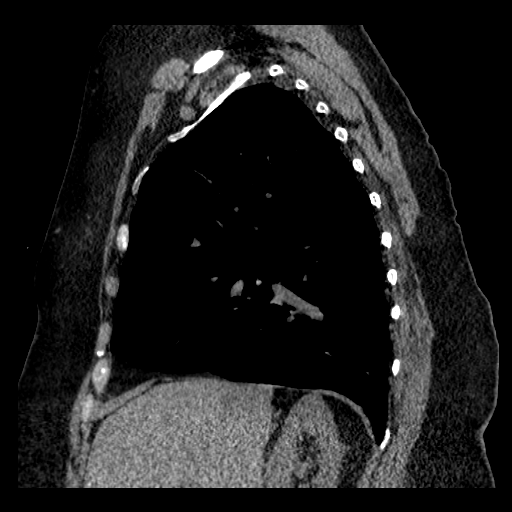

[17 of 30 positions shown; findings below may reference images not displayed]

FINDINGS: Mediastinum: Normal heart size. Aortic atherosclerosis.
Calcification within the LAD coronary artery noted. No pericardial
effusion. The trachea appears patent and is midline. Normal
appearance of the esophagus.

Lungs/Pleura: Moderate changes of centrilobular and paraseptal
emphysema are identified throughout both lungs. No airspace
consolidation or atelectasis. In the left upper lobe there is a 4 x
4 mm nodule (4 mm mean diameter) on image 42 of series 4. Unchanged
from previous exam. In the left upper lobe there is a 5 x 8 mm
nodule (7 mm mean diameter) on image 56 of series 4. Also unchanged
from previous exam. In the right upper lobe there is a 4 x 4 mm
nodule (4 mm mean diameter) on image 48 of series 4. Unchanged from
previous exam. In the posterior right upper lobe there is a 4 x 5 mm
nodule (5 mm mean diameter) on image 45 of series 4. Stable from
previous exam. In the right lower lobe there is a 5 x 5 mm nodule (5
mm mean diameter) on image 75 of series 4. Previously 4 mm.

Upper Abdomen: No acute findings identified within the upper
abdomen.

Musculoskeletal: Spondylosis is identified within the thoracic
spine. There are no aggressive lytic or sclerotic bone lesions
identified.
IMPRESSION: 1. Multiple pulmonary nodules are again noted and are stable dating
back to 07/12/2013. Twenty-four months of stability is compatible
with a benign process.
2. Diffuse bronchial wall thickening with emphysema, as above;
imaging findings suggestive of underlying COPD.
3. Aortic atherosclerosis and coronary artery calcification.

## 2017-01-01 DIAGNOSIS — N3946 Mixed incontinence: Secondary | ICD-10-CM | POA: Diagnosis not present

## 2017-01-01 DIAGNOSIS — R350 Frequency of micturition: Secondary | ICD-10-CM | POA: Diagnosis not present

## 2017-01-10 DIAGNOSIS — Z79899 Other long term (current) drug therapy: Secondary | ICD-10-CM | POA: Diagnosis not present

## 2017-01-10 DIAGNOSIS — Z6841 Body Mass Index (BMI) 40.0 and over, adult: Secondary | ICD-10-CM | POA: Diagnosis not present

## 2017-01-10 DIAGNOSIS — M2550 Pain in unspecified joint: Secondary | ICD-10-CM | POA: Diagnosis not present

## 2017-01-10 DIAGNOSIS — J849 Interstitial pulmonary disease, unspecified: Secondary | ICD-10-CM | POA: Diagnosis not present

## 2017-01-10 DIAGNOSIS — M545 Low back pain: Secondary | ICD-10-CM | POA: Diagnosis not present

## 2017-01-10 DIAGNOSIS — M0579 Rheumatoid arthritis with rheumatoid factor of multiple sites without organ or systems involvement: Secondary | ICD-10-CM | POA: Diagnosis not present

## 2017-01-12 DIAGNOSIS — J849 Interstitial pulmonary disease, unspecified: Secondary | ICD-10-CM | POA: Diagnosis not present

## 2017-01-12 DIAGNOSIS — J449 Chronic obstructive pulmonary disease, unspecified: Secondary | ICD-10-CM | POA: Diagnosis not present

## 2017-01-12 DIAGNOSIS — M05741 Rheumatoid arthritis with rheumatoid factor of right hand without organ or systems involvement: Secondary | ICD-10-CM | POA: Diagnosis not present

## 2017-01-12 DIAGNOSIS — M05742 Rheumatoid arthritis with rheumatoid factor of left hand without organ or systems involvement: Secondary | ICD-10-CM | POA: Diagnosis not present

## 2017-01-12 DIAGNOSIS — R0603 Acute respiratory distress: Secondary | ICD-10-CM | POA: Diagnosis not present

## 2017-01-12 DIAGNOSIS — J439 Emphysema, unspecified: Secondary | ICD-10-CM | POA: Diagnosis not present

## 2017-01-12 DIAGNOSIS — G4733 Obstructive sleep apnea (adult) (pediatric): Secondary | ICD-10-CM | POA: Diagnosis not present

## 2017-01-21 ENCOUNTER — Encounter: Payer: Self-pay | Admitting: Pulmonary Disease

## 2017-01-21 ENCOUNTER — Ambulatory Visit (INDEPENDENT_AMBULATORY_CARE_PROVIDER_SITE_OTHER): Payer: PPO | Admitting: Pulmonary Disease

## 2017-01-21 VITALS — BP 128/88 | HR 80 | Temp 98.7°F | Ht 67.0 in | Wt 268.0 lb

## 2017-01-21 DIAGNOSIS — J449 Chronic obstructive pulmonary disease, unspecified: Secondary | ICD-10-CM | POA: Diagnosis not present

## 2017-01-21 DIAGNOSIS — R0609 Other forms of dyspnea: Secondary | ICD-10-CM

## 2017-01-21 DIAGNOSIS — M069 Rheumatoid arthritis, unspecified: Secondary | ICD-10-CM

## 2017-01-21 DIAGNOSIS — J849 Interstitial pulmonary disease, unspecified: Secondary | ICD-10-CM

## 2017-01-21 DIAGNOSIS — R0600 Dyspnea, unspecified: Secondary | ICD-10-CM

## 2017-01-21 NOTE — Progress Notes (Signed)
Subjective:     Patient ID: [name], female   DOB: 1940/12/19, 76 y.o.   MRN: [id number]  HPI    76 y/o WF whose PCP is DrStoneking and followed by Rheum-DrHawkes w/ seropos RA, underlying ILD, and superimposed mixed COPD (emphysema and chr bronchitis) + exercise induced hypoxemia, & DJD; on Arava & low dose Pred plus formal PT, Xopenex NEBS Tid, Symbicort160-2spBid, Spiriva daily, & started on Home O2 (11/16) at 2L/min w/ exercise...  ~  SEE PREV EPIC NOTES FOR OLDER DATA>  ~  March 29, 2011:  Initial pulmonary consult w/ KC>        The patient is a 76year-old female who I've been asked to see for ongoing pulmonary issues. She has been given the diagnosis of COPD years ago, but also tells me that she was diagnosed with asthma in her late 73s and put on a nebulizer. The patient thinks that her breathing is worsening, and she has had 3-4 episodes this year that have been labeled as "acute exacerbation" her last flareup was the end of November, and she was treated with antibiotics and prednisone. The patient notes a one block dyspnea on exertion at a moderate pace on flat ground. She will also get winded bringing groceries in from the car, and doing any kind of light housework. She notes very significant reflux symptoms, despite being on a proton pump inhibitor. She notes a barking cough at night and also first thing in the morning upon arising. The patient currently is on Symbicort, and has taken this compliantly. She has had no recent chest x-ray or full pulmonary function studies. Of note, her weight is neutral over the last one year.       PLAN>> I suspect, with the patient's long history of tobacco abuse, that she probably does have some degree of chronic airflow obstruction. It is unclear how much asthma may be playing a role here. She has had multiple episodes this year that required antibiotics and prednisone, and I wonder how much of this is related to her underlying lung issues or  possibly laryngopharyngeal reflux. She is having persistent reflux symptoms despite taking a proton pump inhibitor. She also describes cyclical coughing as well, which is typically an upper airway issue. I would like to check a chest x-ray today, and schedule her for full pulmonary function studies. I would also like to treat her reflux more aggressively over the next 3-4 weeks. We'll also add Spiriva to her Symbicort to see if this helps   CXR 03/29/11 showed borderline heart size, ectasia of thoracic Ao, low flat diaphragms, essentially clear lungs, osteopenia & degen spondylosis in spine...  FullPFTs 05/17/11 showed FVC=3.11 (99%), FEV1=1.67 (74%), %1sec=54, mid-flows reduced at 18% predicted; no improvement in FEV1 post bronchodil; TLC=4.52 (84%), RV=1.41 (65%), RV/TLC=31%; DLCO=50% predicted.=> c/w mod airflow obstruction, borderline lung volumes, moderate decr in diffusion capacity...    CT Chest 07/12/13 per DrStoneking showed norm heart size, atherosclerotic changes in Ao & coronaries, no adenopathy; biapical pleuroparenchymal scarring & emphysema, scattered subpleural nodules- 68mm LUL, 93mm RUL, 20mm RML, 74mm RLL...   CT Chest 02/02/14 per DrStoneking showed norm heart size, calcif atherosclerotic Ao & coronaries, no adenopathy, centrilob & paraseptal emphysema noted, mult small nonspecific pulm nodules- 47mm in LUL, 77mm in lingula, 5mm in RUL, 76mm in RML and no new or enlarging pulm lesions (stable pulm nodules)...  ~  February 06, 2015:  Initial pulm eval by SN>  Pt referred by DrStoneking for progressive  dyspnea and abnormal CT Chest... The patient's granddaughter is DrStoneking's nurse.      Pt presents very concerned about her pulmonary situation having recently had a 3rd CT Chest & was told that they detected more & bigger pulmonary nodules & she is scared that she might have cancer; she brought a disc w/ CT Chest done 01/26/15 at [facility] & on my review the scan looks different than  prev- now w/ widespread patchy ground glass opac superimposed on her prev emphysema changes & mult small pulm nodules (now largely obscured by this interstitial process); I am concerned this new ILD may represent COP (cryptogenic organizing pneumonia- the new terminology for our prev BOOP diagnosis)... She is c/o cough, small amt beige sput w/o hemoptysis, notes SOB/DOE w/ exertion (she used to swim a lot but recently has been more sedentary- esp since knee surg 10/2014); notes SOB w/ housework x61mo & stress makes the SOB worse, been using her Symbicort & Spiriva just "as needed" she says; DrStoneking got her a nebulizer w/ Xopenex to use TID but she has run out of this med...       In the course of our conversations [name] tells me that she was prev diagnosed w/ Lupus by DrGates and DrLevitin years ago (1997) but we do not have any old data from this period (note from [place] 02/01/15 just mentioned lung nodules and PMHx has no mention of SLE, prev abn blood tests, prev Rheum eval, etc);  The pt volunteers that she had a pos ANA & was treated by DrLevitin w/ Pred intermittently over a 3 yr period;  ?why she stopped going & it was never mentioned again... She does admit to hx arthritis, hurting all over (esp knees), and she had arthroscopic knee surg by [facility] 10/26/14 "he scraped it out"; she had post op therapy  But she noted her breathing was worse...   Smoking Hx>  she is an ex-smoker, starting at age 3 & smoked for about 50 yrs up to 1.5ppd, & quit in 2003; est ~50-60 pack-yr smoking hx...  Pulmonary Hx>  She reports hx diphtheria as child, had lots of recurrent bronchitic infections in her adult life, she reports several bouts of pneumonia & wonders if these infections left her w/ scar tissue, seen by DrClance 2013 w/ remote hx asthma/ now modCOPD treated w/ Symbicort, Spriva, PPI & antireflux regimen;  ?she had sleep eval 2013 & was started on CPAP (?[facility], no records avail)...  Medical  Hx>  DrStoneking- HL, Obesity, GERD, Divertics, colon polyps, liver AVM, osteopenia, migraines, RLS, anxiety/depression...  Family Hx>  Hx asthma in her mother, otherw no FamHx lung problems...   Occup Hx>  No known occupational exposures- asbestos, chemical toxins, no recent travel or birds...  Current Meds>  Symbicort160, Spiriva, Xopenex neb, Mucinex, Claritin10, ASA81, Lisinopril5, Fish Oil, Prilosec20-2Bid, Celexa40, calcium/ vitamins... EXAM - Afeb, VSS, O2sat=92% on RA;  Obese at 251#, 5'7"Tall, BMI=39;  HEENT- neg, mallampati2;  Chest- decr BS bilat, few basilar rales, no wheezing or consolidation;  Heart- RR gr1-2 SEM w/o r/g;  Abd- obese soft neg;  Ext- w/o c/c/e  CT Chest 01/26/15 by [facility]- report by Dr. Anabel Halon- indicates extensive bilat pulm fibrosis, mult pulm masses & nodules- neoplasia cannot be excluded, mult nodes in mediastinum- mostly wnl in size... To my review this shows widespread patchy ground glass opac superimposed on her prev emphysema changes & mult small pulm nodules (now largely obscured by this interstitial process);  I am concerned this new ILD may represent COP.   CXR 02/06/15 showed norm heart size, lungs appear hyperinflated w/ coarsened interstitial markings bilat & an opacity at left lung base (all this new compared to last CXR 2012)...  Spirometry 02/06/15 showed FVC=1.97 (62%), FEV1=1.40 (60%), %1sec=71%, mid-flows are reduced at 49% predicted;  This is suggestive of mixed mild obstructive & mod restrictive dis...  Ambulatory oxygen saturation test 01/2015> O2sat=92% on RA at rest w/ pulse=95/min;  She ambulated in office only 1 lap w/ lowest O2sat=88% w/ pulse=124/min...  LABS 01/2015>  Chems- wnl;  CBC- wnl x WBC=19.5;  TSH=6.41;  Collagen-vasc screen:  ACE=10 (nl<52), RheumFactor=100 (Hi pos >42), Anti-CCP=213 (strong pos >59), ANA pos 1:320 homogeneous pattern, ANCA screen= NEG (MPO & PR-3), Sed=54, CRP=4.5.Marland KitchenMarland Kitchen IMP/PLAN >>       New ILD per  CXR & CT Chest- c/w COP/BOOP>  The totality of the eval including labs looks like this could be Rheumatoid lung dis; we will refer her to [facility] ASAP & start Pred rx...      Underlying COPD/emphysema, former smoker>  She is on Symbicort160-2spBid, Spiriva daily, Xopenex nebs Tid, Mucinex and rec to take meds regularly, NOT prn...      Hx mult small pulmonary nodules seen on prev CT scans in 2013>  This will need to be followed w/ serial CXR/ CT scans...      OSA- eval & Rx per [facility], no data avail in Epic>  Per DrStoneking & Maxwell Caul...      GERD/ prob LPR>  She needs a vigorous antireflux regimen w/ PPI Bid, NPO after dinner, elev HOB 6"; DrMJohnson is her GI...      Abn collagen-vasc screen w/ +RA, +Anti-CCP, +ANA, Sed54>  She was prev treated by DrLevitin for Lupus 1997-2000, labs look like RA & we will refer pt to [facility] for Rheum eval at this time...  ~  March 14, 2015:  4mo ROV w/ SN>        At the time of our initial visit we started PRED40 x2wks=> 20 x2wks w/ f/u appt 65mo;  She saw [facility] for Rheum in the interval but we don't have note from her to review;  Pt tells me that she has Hx OA w/ prev knee arthroscopy by DrCaffrey & presist w/ lots of knee pain, but she claims that while she was taking the Pred20 she suddenly developed severe pain & swelling in her hands, wrists and ankles=> called DrHawkes who wanted to get her off the Pred & weaned her down further to 10mg /d- recall that we started the Pred for prob Rheumatoid Lung w/ CT scan suggesting COP/BOOP pattern;  She further indicates to me that she fell out of bed 2wks ago- bruised leg & arm notes it's hard to walk w/ pain in feet & legs;  She called DStoneking's office & was switched from Aleve to Tylenol;  It got so bad she went to the ER yest=> given Tramadol & told to f/u here;  She has a follow up appt w/ [facility] tomorrow 03/15/15...      From the pulmonary standpoint she is about the same, but the pain in her hands/  wrist has been her main complaint for the last week or so;  She remains on Symbicort160-2spBid, Spiriva daily, NEBS w/ Xopenex tid, Mucinex prn;  She has had trouble doing the inhalers since her hands &wrist swelled up, pain w/ actuation;  She is SOB & O2sat=90% on RA today in office;  Notes sl dry cough, no sput, no hemoptysis, no CP...       EXAM shows Afeb, VSS, O2sat=90% on RA;  HEENT- neg, mallampati2;  Chest- decr BS bilat, few basilar rales, no wheezing or consolidation;  Heart- RR gr1-2 SEM w/o r/g;  Abd- obese soft neg;  Ext- swollen/ tender wrists & hands!  CXR 03/13/15 in ER> mild cardiomeg, COPD/ hyperinflation, prominent lung markings bilat, no acute changes...   EKG 03/13/15 in ER> sinus tachy, rate 106/min, NSSTTWA, NAD...  Ambulatory O2sat test 03/14/15> O2sat=91% on RA at rest w/ pulse=86/min; on standing w/ min walk O2sat dropped to 85% w/ pulse=106/min... IMP >>      New ILD per CXR & CT Chest- c/w COP/BOOP, prob related to Reumatoid Lung>  We started Pred 40=>20 but this was further cut to 10mg /d by [facility] in the interval; pulm status is clearly no better, ?sl worse, and she needs HOME O2 to be started rec 1-2L/min rest & 2L/min exercise => hypoxemic resp failure...      Underlying COPD/emphysema, former smoker>  She is on Symbicort160-2spBid, Spiriva daily, Xopenex nebs Tid, Mucinex and rec to take meds regularly everyday...      Hx mult small pulmonary nodules seen on prev CT scans in 2015> recent CT 01/2015 showed these nodules were masked by the ground glass opac & interstitial process; this will need to be followed w/ serial CXR/ CT scans...      OSA- eval & Rx per [facility], no data avail in Epic>  Per DrStoneking & Maxwell Caul...      GERD/ prob LPR>  She needs a vigorous antireflux regimen w/ PPI Bid, NPO after dinner, elev HOB 6"; DrMJohnson is her GI...      Abn collagen-vasc screen w/ +RA, +Anti-CCP, +ANA, Sed54> Looks like RA-  She was prev treated by DrLevitin for  Lupus 1997-2000;  She was referred to [facility] for Rheum & her note is pending... PLAN >>       She is asked to keep the Pred at 10mg  per day for now due to her pulmonary process;  She has ROV tomorrow w/ Rheum for follow up & to eval her acute hand, wrist, foot arthritic process;  We are starting HOME O2 due to her acute hypoxemic resp failure... We will follow up in 54month, sooner if needed. ADDENDUM>> Pt seen by [facility] 11/30-- seropos RA, OA of both knees, ILD;  Insurance won't cover TNFs therefore started on ARAVA20 and Pred increased to 20mg /d... ADDENDUM>> Full PFTs 03/30/15:  FVC=2.23 (73%), FEV1=1.50 (65%), %1sec=68, mid-flows reduced at 51% predicted; post bronchodil the FEV1 improved 6%; TLC=4.13 (77%), RV=1.80 (75%), RV/TLC=44; DLCO=50% predicted;  These PFTs are compatible w/ mild restriction and a superimposed mild obstructive defect w/ a mod-severe reduction is DLCO...  ~  May 24, 2015:  58mo ROV w/ SN>  Brennan notes that he breathing is improved w/ her home O2 therapy but she would like an Inogen One POC & we will request this from her DME;  She notes SOB/DOE stable- no progression, and mild dry cough w/o sput, no CP etc... She has remained on Pred per [facility], no recent notes scanned into Epic; on Pred20/d + Arava and she is checked Q59mo w/ slow Pred taper...      ILD/ pulm fibrosis per CXR & CT Chest- c/w COP/BOOP, prob related to Reumatoid Lung>  On Pred & Arava per DrHawkes, Rheum w/ freq OVs and slow taper of the Pred.Marland KitchenMarland Kitchen  Underlying COPD/emphysema, former smoker>  on Symbicort160-2spBid, Spiriva daily, Xopenex nebs Tid, Mucinex and rec to take meds regularly everyday...      Hypoxemic resp failure>  On Home O2 at 2L/min w/ O2sat=97% today in office; she is requesting a smaller POC- wants the Inogen One...      Hx mult small pulmonary nodules seen on prev CT scans in 2015> recent CT 01/2015 at [place] ([facility]) showed these nodules were masked by the ground glass opac &  interstitial process; CXR 11/16 w/ incr interstitial markings.      OSA- eval & Rx per [facility], no data avail in Epic>  per DrStoneking & Maxwell Caul...      GERD/ prob LPR>  She needs a vigorous antireflux regimen w/ PPI (Prilosec40) Bid, NPO after dinner, elev HOB 6"; DrMJohnson is her GI...      Abn collagen-vasc screen w/ +RA, +Anti-CCP, +ANA, Sed54> Looks like RA-  She was prev treated by DrLevitin for Lupus 1997-2000;  She saw [facility] 02/2015 & started on ARAVA while slowly weaning Pred. EXAM shows Afeb, VSS, O2sat=97% on 2L/min pulse dose;  Wt=267#, 5'7"Tall, BMI=40;  HEENT- neg, mallampati2;  Chest- decr BS bilat, few basilar rales, no wheezing or consolidation;  Heart- RR gr1-2 SEM w/o r/g;  Abd- obese soft neg;  Ext- swollen/ sl tender wrists & hands! IMP/PLAN>>  She is slowly weaning the Pred per Rheum & taking the Arava20;  We will request a POC device for her;  We plan ROV in 79mo w/ f/u CXR/ CT Chest...  ~  July 24, 2015:  41mo ROV and [name] notes that her breathing is stable> she is an ex-smoker w/ mixed obstructive (COPD/emphysema) & restrictive (ILD w/ prob RA and obesity) lung dis, chronic hypoxemic resp failure on HomeO2;  She has been using her Symbocort160-2spBid, Spiriva daily, Mucinex600- one tab daily; she has not been using her nebulizer and notes that her breathing is good;  She is followed by DrHawkes Q40mo on Arava & slow Pred taper- currently on 10mg /d...      ILD/ pulm fibrosis per CXR & CT Chest- c/w COP/BOOP, prob related to Reumatoid Lung>  On Pred (currently 10mg /d) & Arava per DrHawkes, Rheum w/ freq OVs and slow taper of the Pred...      Underlying COPD/emphysema, former smoker>  on Symbicort160-2spBid, Spiriva daily, Xopenex nebs Tid (only using this prn), Mucinex and rec to take meds regularly everyday...      HEx-induced hypoxemic resp failure>  On Home O2 at 2L/min w/ O2sat=97% on 2L in office today on her new "simply go" POC...       Hx mult small pulmonary  nodules seen on prev CT scans in 2015> CT 01/2015 at [place] ([facility]) showed these nodules were masked by the ground glass opac & interstitial process; CXR 11/16 w/ incr interstitial markings.      OSA- eval & Rx per [facility], no data avail in Epic>  per DrStoneking & Maxwell Caul...      GERD/ prob LPR>  She needs a vigorous antireflux regimen w/ PPI (Prilosec40) Bid, NPO after dinner, elev HOB 6"; DrMJohnson is her GI...      Abn collagen-vasc screen w/ +RA, +Anti-CCP, +ANA, Sed54> Looks like RA-  She was prev treated by DrLevitin for Lupus 1997-2000;  She saw [facility] 02/2015 & started on ARAVA while slowly weaning Pred. EXAM shows Afeb, VSS, O2sat=97% on 2L/min pulse dose;  Wt=267#, 5'7"Tall, BMI=42;  HEENT- neg, mallampati2;  Chest- decr BS bilat,  few basilar rales, no wheezing or consolidation;  Heart- RR gr1-2 SEM w/o r/g;  Abd- obese soft neg;  Ext- much improved wrists & hands  CT Chest => done 07/28/15> norm heart size, atherosclerotic changes in Ao & LAD, mod centrilobular & paraseptal emphysema bilaterally, mult pulmonary nodules are unchanged from 06/2013 scan and felt c/w benign process, NO MENTION MADE OF INTERSTITIAL PROCESS SEEN ON CT CHEST 01/26/15 from [facility] => now largely resolved...    IMP/PLAN>>  Ceara's breathing is stable, at her new baseline & we will proceed w/ new CT Chest to compare to the old; slow Pred taper per rheum and she desperately needs to get on diet & get weight down; continue O2, Symbicort, Spiriva, etc...  ~  January 22, 2016:  62mo ROV w/ SN>  [name] reports an acute exac of her COPD starting several days ago w/ cough, greenish sput production, mod chest congestion, no hemoptysis, sl incr SOB but no CP/ palpait/ edema; she remains on Home O2 at 1-2L/min, NEBS w/ Xopenex Tid, Symbicort160-2spBid, Spiriva daily; Her Pred was boosted to 12.5mg /d in August by [facility] for an RA flair at that time & she continues on the [place] daily;  We discussed treatment for  this AB exac w/ Levaquin + probiotic...       ILD/ pulm fibrosis per CXRs & prev CT Chest- c/w COP/BOOP, prob related to Reumatoid Lung>  On Pred (currently 12.5mg /d per DrHawkes) & Arava, Rheum w/ freq OVs and slow taper of the Pred planned...      Underlying mixed COPD w/ emphysema & chr bronchitis, former smoker>  on Symbicort160-2spBid, Spiriva daily, Xopenex nebs Tid (only using this prn), Mucinex and rec to take meds regularly everyday...      Hx ex-induced hypoxemic resp failure>  On Home O2 at 2L/min w/ O2sat=97% on 2L in office today on her new "simply go" POC...       Hx mult small pulmonary nodules seen on prev CT scans in 2015> CT 01/2015 at [place] ([facility]) showed these nodules were masked by the ground glass opac & interstitial process; CXR 11/16 w/ incr interstitial markings; f/u CT 07/2015 showed stable pulm nodules and resolution of the interstitial process prev seen...      OSA- eval & Rx per [facility], no data avail in Epic>  per DrStoneking & Maxwell Caul...      Obesity>  Dillan weighs ~270#, 5'7"Tall, BMI=42; she has been counseled on DIET/ EXERCISE/ wt reducing strategies...      GERD/ prob LPR>  She needs a vigorous antireflux regimen w/ PPI (Prilosec40) Bid, NPO after dinner, elev HOB 6"; DrMJohnson is her GI...      Abn collagen-vasc screen w/ +RA, +Anti-CCP, +ANA, Sed54> +RA-  She was prev treated by DrLevitin for Lupus 1997-2000;  She saw [facility] 02/2015 & started on ARAVA while slowly weaning Pred. EXAM shows Afeb, VSS, O2sat=100% on 1L/min pulse dose;  Wt=268#, 5'7"Tall, BMI=40;  HEENT- neg, mallampati2;  Chest- decr BS bilat, few basilar rales, no wheezing or consolidation;  Heart- RR gr1-2 SEM w/o r/g;  Abd- obese soft neg;  Ext- much improved wrists & hands IMP/PLAN>>  I placed [name] on Levaquin for this bronchitic flair & rec that she take a probiotic like Align while on the antibiotic;  She will continue her O2, Prednisone, Symbicort, Spiriva, & Mucinex;  Reminded  about the recommended antireflux regimen;  Also reminded about diet/ exercise & wt reduction strategies...  ~  July 22, 2016:  77mo ROV & pulmonary recheck> Her PCP is DrStoneking, & Rheum- DrHawkes=> outpt rehab for difficullty walking...  She states her breathing is good, no change; notes incr SOB w/ activities but ADLs are still ok (eg-  Vacuums and takes her time, but stairs are bad)... On O2 at 1L/min rest & 2L/min activity; current Pred taper, Symbicort160-2spBid, Spiriva once daily,  XOPENEX NEBS but not using Tid- just does this Prn she says & "hasn't needed";  She has Mucinex & Claritin for as needed use too;  She remains on ARAVA per [facility]...    EXAM shows Afeb, VSS, O2sat=96% on 1L/min pulse dose;  Wt=276#, 5'7"Tall, BMI=43;  HEENT- neg, mallampati2;  Chest- decr BS bilat, few basilar rales, no wheezing or consolidation;  Heart- RR gr1-2 SEM w/o r/g;  Abd- obese soft neg;  Ext- much improved wrists & hands.  CXR 07/22/16>  Norm heart size, hyperinflation c/w COPD, mild chr interstitial thickening- no focal abn & NAD...  IMP/PLAN>>  [name] appears stable but must desperately lose the weight!  She requests a Rx for [facility] to keep on hand & we discussed this- must DIET/ EXERCISE/ get wt down! Continue same meds...  ~  November 12, 2016:  61mo ROV & [name] called for an acute visit- add on appt due to a 4d hx CP & SOB, notes substernal CP that is incr w/ a deep breath/ movement/ & thru to her back; her SOB is a sensation of not getting a deep breath;  I note that her weight is down 9# to 267# today & she is watching her intake but still not exercising; notes mild cough, small amt clear sput, no hemoptysis, no f/c/s, no edema etc... She remains on Rx w/ O2 at 2L/min w/ activ & 1L/min rest; NEBs w/ Xopenex Tid; Symbicort160-2spBid & Spiriva once daily; Mucinex600 once daily; Levaquin for prn use;  She remains on Pred7.5mg /d, Plaquenil200Bid, & Arava per [facility] (last note 07/09/16);  She remains on  Alendronate70, Mel[place] per DrStoneking, but off Lipitor due to leg cramps...  We discussed further eval w/ f/u CXR, EKG, ambulatory oximetry today>>    EXAM shows Afeb, VSS, O2sat=95% on 1L/min pulse dose;  Wt=267#, 5'7"Tall, BMI=42;  HEENT- neg, mallampati2;  Chest- decr BS bilat, few basilar rales, no wheezing or consolidation;  Heart- RR gr1-2 SEM w/o r/g;  Abd- obese soft neg;  Ext- much improved wrists & hands.  CXR 11/12/16 (independently reviewed by me in the PACS system) showed heart at upper lim of norm, hyperinflation c/w COPD, chr interstitial prominence (stable)- NAD.Marland KitchenMarland Kitchen   EKG 11/12/16 shows NSR, rate75, wnl...  Ambulatory Oximetry 11/12/16 on 1L/min showed O2sat=94% at rest w/ pulse=85/min;  She ambulated only 2 Laps (185'ea) on 1L/min w/ lowest O2sat=89% w/ heart rate= 95/min;  She could not go further due to leg fatigue...  IMP/PLAN>>  Anamaria seems to have developed some CWP/ inflamm & has assoc SOB/DOE fom this & chest wall factors- her COPD, ?rheumatoid interstitial dis, etc- all appears stable;  She is rec to REST, apply HEAT, & use Tramadol50 + Tylenol for the pain;  She will continue her current breathing meds regularly & over the longer term must get on diet, incr exercise, & must lose weight... She will call for any worsening symptoms and f/u w/ [place] in 2-67mo...   ~  January 21, 2017:  68mo ROV & pulm f/u visit>  [name] reports a good interval- her CWP/inflamm resolved w/ the rest, heat, Tramadol; and her  assoc SOB has resolved; min cough, no sput at present; she tells me that she has stopped doing her NEBS w/ Xopenex (stopped on her own) but she has continued the Symbicort160-2spBid & Spiriva daily regularly;  She also has Home O2 & received a smaller simply Go Mini last year but now she tells me "I don't use it when I go outside" (supposed to be on 1L/min rest & 2L/min exercise);  In addition [name] has not lost any weight- still at 268# w/ BMI=42- we again reviewed the importanceof  using her O2 w/ exercise, getting on diet & getting her weight down!  We reviewed the following medical problems during today's office visit >>       ILD/ pulm fibrosis per CXRs & prev CT Chest- c/w COP/BOOP, prob related to Reumatoid Lung>  On Pred (currently 7.5mg /d per DrHawkes) + Plaquenil & Arava per Rheum w/ freq OVs and slow taper of the Pred planned.      Underlying mixed COPD w/ emphysema & chr bronchitis, former smoker>  on Symbicort160-2spBid, Spiriva daily, Xopenex nebs Tid (only using this prn), Mucinex and rec to take meds regularly everyday...      Hx ex-induced hypoxemic resp failure>  On Home O2 at 2L/min w/ O2sat=97% on 2L in office today on her new "simply go" POC...       Hx mult small pulmonary nodules seen on prev CT scans in 2015> CT 01/2015 at [place] ([facility]) showed these nodules were masked by the ground glass opac & interstitial process; CXR 11/16 w/ incr interstitial markings; f/u CT 07/2015 showed stable pulm nodules and resolution of the interstitial process prev seen...      [facility]- eval & Rx per [facility], no data avail in Epic>  per DrStoneking & Maxwell Caul...      Obesity>  [name] weighs ~268#, 5'7"Tall, BMI=42; she has been counseled on DIET/ EXERCISE/ wt reducing strategies...      GERD/ prob LPR>  She needs a vigorous antireflux regimen w/ PPI (Prilosec40) Bid, NPO after dinner, elev HOB 6"; DrMJohnson is her GI...      Abn collagen-vasc screen w/ +RA, +Anti-CCP, +ANA, Sed54> +RA-  She was prev treated by DrLevitin for Lupus 1997-2000;  She saw [facility] 02/2015 & started on ARAVA while slowly weaning Pred. EXAM shows Afeb, VSS, O2sat=97% on 2L/min pulse dose;  Wt=268#, 5'7"Tall, BMI=40;  HEENT- neg, mallampati2;  Chest- decr BS bilat, few basilar rales, no wheezing or consolidation;  Heart- RR gr1-2 SEM w/o r/g;  Abd- obese soft neg;  Ext- much improved wrists & hands IMP/PLAN>>  Problems as noted and she is stable on current regimen- Pred/ Plaquenil/ Arava per Rheum  and O2, NEBS, Symbicort/ Spiriva, and Mucinex from [place];  We again reviewed the imperative to lose weight thru diet/ exercise/ etc...     Past Medical History:  Diagnosis Date  . Allergic rhinitis   . Asthmatic bronchitis   . Colon polyps   . COPD (chronic obstructive pulmonary disease) ([place])   . Diverticulosis   . GERD (gastroesophageal reflux disease)   . H/O: GI bleed   . Hypercholesterolemia >> PCP is DrStoneking   . Migraine headache   . Obesity   . RLS (restless legs syndrome)    Osteoarthritis    RHEUMATOID ARTHRITIS >> followed by [facility]     Past Surgical History:  Procedure Laterality Date  . TONSILLECTOMY  x2  . TOTAL ABDOMINAL HYSTERECTOMY  02/17/1989    Outpatient Encounter Prescriptions as of  01/21/2017  Medication Sig  . acetaminophen (TYLENOL) 500 MG tablet Take 1,000 mg by mouth every 6 (six) hours as needed for moderate pain.  . Ascorbic Acid (VITAMIN C) 1000 MG tablet Take 1,000 mg by mouth daily.  Marland Kitchen aspirin 81 MG tablet Take 81 mg by mouth every other day.  . budesonide-formoterol (SYMBICORT) 160-4.5 MCG/ACT inhaler Inhale 2 puffs into the lungs 2 (two) times daily.  . Calcium Carbonate-Vitamin D (CALCIUM + D) 600-200 MG-UNIT TABS Take 1 tablet by mouth daily.    . citalopram (CELEXA) 40 MG tablet Take 40 mg by mouth daily.  Marland Kitchen guaiFENesin (MUCINEX) 600 MG 12 hr tablet Take 600 mg by mouth daily.   . Leflunomide (ARAVA PO) Take by mouth daily.  Marland Kitchen levalbuterol (XOPENEX) 1.25 MG/3ML nebulizer solution Take 1.25 mg by nebulization 3 (three) times daily. (Patient taking differently: Take 1.25 mg by nebulization 3 (three) times daily as needed. )  . lisinopril (PRINIVIL,ZESTRIL) 5 MG tablet Take 5 mg by mouth daily.   Marland Kitchen loratadine (CLARITIN) 10 MG tablet Take 10 mg by mouth daily.    . Omega-3 Fatty Acids (FISH OIL) 1200 MG CAPS Take 1 capsule by mouth daily.    Marland Kitchen omeprazole (PRILOSEC) 40 MG capsule Take 1 capsule by mouth 2 (two) times daily.  . OXYGEN Inhale  into the lungs. 2 lpm with rest and exertion  . predniSONE (DELTASONE) 5 MG tablet Take 7.5 mg by mouth daily with breakfast.  . Tiotropium Bromide Monohydrate (SPIRIVA RESPIMAT) 1.25 MCG/ACT AERS Inhale 2 puffs into the lungs daily.  . traMADol (ULTRAM) 50 MG tablet Take 1/2 to 1 tablet TID with Extra Strength Tylenol  . valACYclovir (VALTREX) 1000 MG tablet Take 1,000 mg by mouth daily as needed (for breakouts).   . Tiotropium Bromide Monohydrate (SPIRIVA RESPIMAT) 1.25 MCG/ACT AERS Inhale 2 puffs into the lungs daily.  . [DISCONTINUED] levofloxacin (LEVAQUIN) 500 MG tablet Take 1 tablet (500 mg total) by mouth daily. (Patient not taking: Reported on 01/21/2017)  . [DISCONTINUED] predniSONE (DELTASONE) 10 MG tablet Take 10 mg by mouth daily with breakfast.    No facility-administered encounter medications on file as of 01/21/2017.     Allergies  Allergen Reactions  . Shellfish Allergy Anaphylaxis    With vomiting and diarrhea  . Macrolides And Ketolides   . Penicillins     Facial numbness  . Sertraline Hcl   . Sulfa Antibiotics     Facial numbness    Immunization History  Administered Date(s) Administered  . Influenza, High Dose Seasonal PF 01/17/2016, 01/14/2017  . Influenza-Unspecified 12/30/2014  . Pneumococcal Conjugate-13 07/07/2013  . Pneumococcal Polysaccharide-23 03/03/2009  . Zoster Recombinat (Shingrix) 12/14/2016    Current Medications, Allergies, Past Medical History, Past Surgical History, Family History, and Social History were reviewed in [employer] record.   Review of Systems             All symptoms NEG except where BOLDED >>  Constitutional:  F/C/S, fatigue, anorexia, unexpected weight change. HEENT:  HA, visual changes, hearing loss, earache, nasal symptoms, sore throat, mouth sores, hoarseness. Resp:  cough, sputum, hemoptysis; SOB, tightness, wheezing. Cardio:  CP, palpit, DOE, orthopnea, edema. GI:  N/V/D/C, blood in stool;  reflux, abd pain, distention, gas. GU:  dysuria, freq, urgency, hematuria, flank pain, voiding difficulty. MS:  joint pain, swelling, tenderness, decr ROM; neck pain, back pain, etc. Neuro:  HA, tremors, seizures, dizziness, syncope, weakness, numbness, gait abn. Skin:  suspicious lesions or skin  rash. Heme:  adenopathy, bruising, bleeding. Psyche:  confusion, agitation, sleep disturbance, hallucinations, anxiety, depression suicidal.   Objective:   Physical Exam       Vital Signs:  Reviewed...   General:  WD, obese, 76 y/o WF in NAD; alert & oriented; pleasant & cooperative... HEENT:  /AT; Conjunctiva- pink, Sclera- nonicteric, EOM-wnl, PERRLA, EACs-clear, TMs-wnl; NOSE-clear; THROAT-clear & wnl.  Neck:  Supple w/ fair ROM; no JVD; normal carotid impulses w/o bruits; no thyromegaly or nodules palpated; no lymphadenopathy.  Chest:  Decr BS bilat, few basilar rales, no wheezing rhonchi or signs of consolidation... Heart:  Regular Rhythm; norm S1 & S2 gr1-2/6 SEM, w/o rubs or gallops detected. Abdomen:  Soft & nontender- no guarding or rebound; normal bowel sounds; no organomegaly or masses palpated. Ext:  decrROM; without deformities +arthritic changes; no varicose veins, +venous insuffic, tr edema;  Pulses intact w/o bruits. Neuro:  No focal neuro deficits; sensory testing normal; gait OK & balance OK. Derm:  No lesions noted; no rash etc. Lymph:  No cervical, supraclavicular, axillary, or inguinal adenopathy palpated.   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      Underlying COPD/emphysema, former smoker>  on Symbicort160-2spBid, Spiriva daily, Xopenex nebs Tid (only using this prn), Mucinex and rec to take meds regularly everyday...      Hypoxemic resp failure>  On Home O2 at 2L/min w/ O2sat=97% on 2L in office  today on her new "simply go" POC...       Hx mult small pulmonary nodules seen on prev CT scans in 2015> CT 01/2015 at [place] ([facility]) showed these nodules were masked by the ground glass opac & interstitial process; CXR 11/16 w/ incr interstitial markings.      [facility]- eval & Rx per [facility], no data avail in Epic>  per DrStoneking & Maxwell Caul...      GERD/ prob LPR>  She needs a vigorous antireflux regimen w/ PPI (Prilosec40) Bid, NPO after dinner, elev HOB 6"; DrMJohnson is her GI...      Abn collagen-vasc screen w/ +RA, +Anti-CCP, +ANA, Sed54> Looks like RA-  She was prev treated by DrLevitin for Lupus 1997-2000;  [facility]/2015 & started on ARAVA while slowly weaning Pred.  PLAN >>  02/10/15>  Start Pred 20mg  tabs- 40mg  Qam for 2 wks, then 20mg /d til return;  Refer to [place], ASAP;  Continue other meds reglarly (Symbicort, Spiriva, Xopenex, Mucinex);  Needs vigorous antireflux regimen... 03/14/15>  She is asked to keep the Pred at 10mg  per day for now due to her pulmonary process;  She has ROV tomorrow w/ Rheum for follow up & to eval her acute hand, wrist, foot arthritic process;  We are starting HOME O2 due to her acute hypoxemic resp failure...  05/24/15>   She has established w/ DrHawkes for Rheum on slow Pred taper + Arava;  Breathing is stable on O2, Pred, XopenexTid, Symbicort160, Spiriva, and Mucinex... 07/24/15>   [name]'s breathing is stable, at her new baseline & we will proceed w/ new CT Chest to compare to the old; slow Pred taper per rheum and she desperately needs to get on diet & get weight down; continue O2, Symbicort, Spiriva, etc... 01/22/16>   I placed [name] on Levaquin for this  bronchitic flair & rec that she take a probiotic like Align while on the antibiotic;  She will continue her O2, Prednisone, Symbicort, Spiriva, & Mucinex;  Reminded about the recommended antireflux regimen;  Also reminded about diet/ exercise & wt reduction strategies 11/12/16>   [name]  seems to have developed some CWP/ inflamm & has assoc SOB/DOE fom this & chest wall factors- her COPD, ?rheumatoid interstitial dis, etc- all appears stable;  She is rec to REST, apply HEAT, & use Tramadol50 + Tylenol for the pain;  She will continue her current breathing meds regularly & over the longer term must get on diet, incr exercise, & must lose weight... She will cll for any worsening symptoms and f/u w/ [place] in 2-70mo. 01/21/17>   Problems as noted and she is stable on current regimen- Pred/ Plaquenil/ Arava per Rheum and O2, NEBS, Symbicort/ Spiriva, and Mucinex from [place];  We again reviewed the imperative to lose weight thru diet/ exercise/ etc     Plan:                                      HOME O2 at 1-2L/min at rest and 2L/min w/ activity... Patient's Medications  New Prescriptions   No medications on file  Previous Medications   ACETAMINOPHEN (TYLENOL) 500 MG TABLET    Take 1,000 mg by mouth every 6 (six) hours as needed for moderate pain.   ASCORBIC ACID (VITAMIN C) 1000 MG TABLET    Take 1,000 mg by mouth daily.   ASPIRIN 81 MG TABLET    Take 81 mg by mouth every other day.   BUDESONIDE-FORMOTEROL (SYMBICORT) 160-4.5 MCG/ACT INHALER    Inhale 2 puffs into the lungs 2 (two) times daily.   CALCIUM CARBONATE-VITAMIN D (CALCIUM + D) 600-200 MG-UNIT TABS    Take 1 tablet by mouth daily.     CITALOPRAM (CELEXA) 40 MG TABLET    Take 40 mg by mouth daily.   GUAIFENESIN (MUCINEX) 600 MG 12 HR TABLET    Take 600 mg by mouth daily.    LEFLUNOMIDE (ARAVA PO)    Take by mouth daily.   LEVALBUTEROL (XOPENEX) 1.25 MG/3ML NEBULIZER SOLUTION    Take 1.25 mg by nebulization 3 (three) times daily.   LISINOPRIL (PRINIVIL,ZESTRIL) 5 MG TABLET    Take 5 mg by mouth daily.    LORATADINE (CLARITIN) 10 MG TABLET    Take 10 mg by mouth daily.     OMEGA-3 FATTY ACIDS (FISH OIL) 1200 MG CAPS    Take 1 capsule by mouth daily.     OMEPRAZOLE (PRILOSEC) 40 MG CAPSULE    Take 1 capsule by mouth 2 (two) times daily.    OXYGEN    Inhale into the lungs. 2 lpm with rest and exertion   PREDNISONE (DELTASONE) 5 MG TABLET    Take 7.5 mg by mouth daily with breakfast.   TIOTROPIUM BROMIDE MONOHYDRATE (SPIRIVA RESPIMAT) 1.25 MCG/ACT AERS    Inhale 2 puffs into the lungs daily.   TIOTROPIUM BROMIDE MONOHYDRATE (SPIRIVA RESPIMAT) 1.25 MCG/ACT AERS    Inhale 2 puffs into the lungs daily.   TRAMADOL (ULTRAM) 50 MG TABLET    Take 1/2 to 1 tablet TID with Extra Strength Tylenol   VALACYCLOVIR (VALTREX) 1000 MG TABLET    Take 1,000 mg by mouth daily as needed (for breakouts).   Modified Medications   No medications on  file  Discontinued Medications   LEVOFLOXACIN (LEVAQUIN) 500 MG TABLET    Take 1 tablet (500 mg total) by mouth daily.   PREDNISONE (DELTASONE) 10 MG TABLET    Take 10 mg by mouth daily with breakfast.

## 2017-01-21 NOTE — Patient Instructions (Signed)
Today we updated your med list in our EPIC system...    Continue your current medications the same...  Let's get on track w/ our diet/ exercise/ & weight reduction plan!!!  Call for any questions...  Let's plan a follow up visit in 96mo, sooner if needed for problems.Marland KitchenMarland Kitchen

## 2017-01-24 ENCOUNTER — Other Ambulatory Visit: Payer: Self-pay | Admitting: Geriatric Medicine

## 2017-01-24 DIAGNOSIS — Z1231 Encounter for screening mammogram for malignant neoplasm of breast: Secondary | ICD-10-CM

## 2017-01-27 DIAGNOSIS — I10 Essential (primary) hypertension: Secondary | ICD-10-CM | POA: Diagnosis not present

## 2017-01-27 DIAGNOSIS — G2581 Restless legs syndrome: Secondary | ICD-10-CM | POA: Diagnosis not present

## 2017-01-27 DIAGNOSIS — Z6841 Body Mass Index (BMI) 40.0 and over, adult: Secondary | ICD-10-CM | POA: Diagnosis not present

## 2017-01-27 DIAGNOSIS — J9611 Chronic respiratory failure with hypoxia: Secondary | ICD-10-CM | POA: Diagnosis not present

## 2017-01-27 DIAGNOSIS — J449 Chronic obstructive pulmonary disease, unspecified: Secondary | ICD-10-CM | POA: Diagnosis not present

## 2017-01-27 DIAGNOSIS — R202 Paresthesia of skin: Secondary | ICD-10-CM | POA: Diagnosis not present

## 2017-02-11 DIAGNOSIS — J449 Chronic obstructive pulmonary disease, unspecified: Secondary | ICD-10-CM | POA: Diagnosis not present

## 2017-02-11 DIAGNOSIS — G4733 Obstructive sleep apnea (adult) (pediatric): Secondary | ICD-10-CM | POA: Diagnosis not present

## 2017-02-11 DIAGNOSIS — J439 Emphysema, unspecified: Secondary | ICD-10-CM | POA: Diagnosis not present

## 2017-02-11 DIAGNOSIS — M05742 Rheumatoid arthritis with rheumatoid factor of left hand without organ or systems involvement: Secondary | ICD-10-CM | POA: Diagnosis not present

## 2017-02-11 DIAGNOSIS — R0603 Acute respiratory distress: Secondary | ICD-10-CM | POA: Diagnosis not present

## 2017-02-11 DIAGNOSIS — J849 Interstitial pulmonary disease, unspecified: Secondary | ICD-10-CM | POA: Diagnosis not present

## 2017-02-11 DIAGNOSIS — M05741 Rheumatoid arthritis with rheumatoid factor of right hand without organ or systems involvement: Secondary | ICD-10-CM | POA: Diagnosis not present

## 2017-03-10 ENCOUNTER — Ambulatory Visit
Admission: RE | Admit: 2017-03-10 | Discharge: 2017-03-10 | Disposition: A | Discharge status: Home / Self Care | Payer: PPO | Source: Ambulatory Visit | Attending: Geriatric Medicine | Admitting: Geriatric Medicine

## 2017-03-10 DIAGNOSIS — Z1231 Encounter for screening mammogram for malignant neoplasm of breast: Secondary | ICD-10-CM | POA: Diagnosis not present

## 2017-03-14 DIAGNOSIS — G4733 Obstructive sleep apnea (adult) (pediatric): Secondary | ICD-10-CM | POA: Diagnosis not present

## 2017-03-14 DIAGNOSIS — J439 Emphysema, unspecified: Secondary | ICD-10-CM | POA: Diagnosis not present

## 2017-03-14 DIAGNOSIS — M05741 Rheumatoid arthritis with rheumatoid factor of right hand without organ or systems involvement: Secondary | ICD-10-CM | POA: Diagnosis not present

## 2017-03-14 DIAGNOSIS — J449 Chronic obstructive pulmonary disease, unspecified: Secondary | ICD-10-CM | POA: Diagnosis not present

## 2017-03-14 DIAGNOSIS — M05742 Rheumatoid arthritis with rheumatoid factor of left hand without organ or systems involvement: Secondary | ICD-10-CM | POA: Diagnosis not present

## 2017-03-14 DIAGNOSIS — J849 Interstitial pulmonary disease, unspecified: Secondary | ICD-10-CM | POA: Diagnosis not present

## 2017-03-14 DIAGNOSIS — R0603 Acute respiratory distress: Secondary | ICD-10-CM | POA: Diagnosis not present

## 2017-04-13 DIAGNOSIS — J439 Emphysema, unspecified: Secondary | ICD-10-CM | POA: Diagnosis not present

## 2017-04-13 DIAGNOSIS — J449 Chronic obstructive pulmonary disease, unspecified: Secondary | ICD-10-CM | POA: Diagnosis not present

## 2017-04-13 DIAGNOSIS — G4733 Obstructive sleep apnea (adult) (pediatric): Secondary | ICD-10-CM | POA: Diagnosis not present

## 2017-04-13 DIAGNOSIS — J849 Interstitial pulmonary disease, unspecified: Secondary | ICD-10-CM | POA: Diagnosis not present

## 2017-04-13 DIAGNOSIS — M05741 Rheumatoid arthritis with rheumatoid factor of right hand without organ or systems involvement: Secondary | ICD-10-CM | POA: Diagnosis not present

## 2017-04-13 DIAGNOSIS — R0603 Acute respiratory distress: Secondary | ICD-10-CM | POA: Diagnosis not present

## 2017-04-13 DIAGNOSIS — M05742 Rheumatoid arthritis with rheumatoid factor of left hand without organ or systems involvement: Secondary | ICD-10-CM | POA: Diagnosis not present

## 2017-04-16 DIAGNOSIS — M0579 Rheumatoid arthritis with rheumatoid factor of multiple sites without organ or systems involvement: Secondary | ICD-10-CM | POA: Diagnosis not present

## 2017-04-16 DIAGNOSIS — M2550 Pain in unspecified joint: Secondary | ICD-10-CM | POA: Diagnosis not present

## 2017-04-16 DIAGNOSIS — Z79899 Other long term (current) drug therapy: Secondary | ICD-10-CM | POA: Diagnosis not present

## 2017-04-16 DIAGNOSIS — Z6841 Body Mass Index (BMI) 40.0 and over, adult: Secondary | ICD-10-CM | POA: Diagnosis not present

## 2017-04-16 DIAGNOSIS — J849 Interstitial pulmonary disease, unspecified: Secondary | ICD-10-CM | POA: Diagnosis not present

## 2017-05-14 DIAGNOSIS — M05741 Rheumatoid arthritis with rheumatoid factor of right hand without organ or systems involvement: Secondary | ICD-10-CM | POA: Diagnosis not present

## 2017-05-14 DIAGNOSIS — G4733 Obstructive sleep apnea (adult) (pediatric): Secondary | ICD-10-CM | POA: Diagnosis not present

## 2017-05-14 DIAGNOSIS — J449 Chronic obstructive pulmonary disease, unspecified: Secondary | ICD-10-CM | POA: Diagnosis not present

## 2017-05-14 DIAGNOSIS — M05742 Rheumatoid arthritis with rheumatoid factor of left hand without organ or systems involvement: Secondary | ICD-10-CM | POA: Diagnosis not present

## 2017-05-14 DIAGNOSIS — R0603 Acute respiratory distress: Secondary | ICD-10-CM | POA: Diagnosis not present

## 2017-05-14 DIAGNOSIS — J439 Emphysema, unspecified: Secondary | ICD-10-CM | POA: Diagnosis not present

## 2017-05-14 DIAGNOSIS — J849 Interstitial pulmonary disease, unspecified: Secondary | ICD-10-CM | POA: Diagnosis not present

## 2017-05-26 ENCOUNTER — Encounter: Payer: Self-pay | Admitting: Pulmonary Disease

## 2017-05-26 ENCOUNTER — Ambulatory Visit: Payer: PPO | Admitting: Pulmonary Disease

## 2017-05-26 VITALS — BP 136/76 | HR 80 | Temp 97.8°F | Ht 67.5 in | Wt 282.2 lb

## 2017-05-26 DIAGNOSIS — R0609 Other forms of dyspnea: Secondary | ICD-10-CM | POA: Diagnosis not present

## 2017-05-26 DIAGNOSIS — J849 Interstitial pulmonary disease, unspecified: Secondary | ICD-10-CM | POA: Diagnosis not present

## 2017-05-26 DIAGNOSIS — J449 Chronic obstructive pulmonary disease, unspecified: Secondary | ICD-10-CM

## 2017-05-26 DIAGNOSIS — R0600 Dyspnea, unspecified: Secondary | ICD-10-CM

## 2017-05-26 DIAGNOSIS — M069 Rheumatoid arthritis, unspecified: Secondary | ICD-10-CM | POA: Diagnosis not present

## 2017-05-26 NOTE — Patient Instructions (Signed)
Today we updated your med list in our EPIC system...    Continue your current medications the same...    We added the Plaquenil & Mirapex meds from [facility] & DrStoneking...  Continue the Symbicort & Spiriva as you are doing... Don't be afraid to use the NEBULIZER if necessary   Continue your Oxygen at 1-2L/min as we discussed...  Call for any questions...  Let's plan a follow up visit in 70mo, sooner if needed for problems.Marland KitchenMarland Kitchen

## 2017-05-26 NOTE — Progress Notes (Signed)
Subjective:     Patient ID: [name], female   DOB: 1940/12/19, 77 y.o.   MRN: [id number]  HPI    77 y/o WF whose PCP is DrStoneking and followed by Rheum-DrHawkes w/ seropos RA, underlying ILD, and superimposed mixed COPD (emphysema and chr bronchitis) + exercise induced hypoxemia, & DJD; on Arava & low dose Pred plus formal PT, Xopenex NEBS Tid, Symbicort160-2spBid, Spiriva daily, & started on Home O2 (11/16) at 2L/min w/ exercise...  ~  SEE PREV EPIC NOTES FOR OLDER DATA>  ~  March 29, 2011:  Initial pulmonary consult w/ KC>        The patient is a 77year-old female who I've been asked to see for ongoing pulmonary issues. She has been given the diagnosis of COPD years ago, but also tells me that she was diagnosed with asthma in her late 73s and put on a nebulizer. The patient thinks that her breathing is worsening, and she has had 3-4 episodes this year that have been labeled as "acute exacerbation" her last flareup was the end of November, and she was treated with antibiotics and prednisone. The patient notes a one block dyspnea on exertion at a moderate pace on flat ground. She will also get winded bringing groceries in from the car, and doing any kind of light housework. She notes very significant reflux symptoms, despite being on a proton pump inhibitor. She notes a barking cough at night and also first thing in the morning upon arising. The patient currently is on Symbicort, and has taken this compliantly. She has had no recent chest x-ray or full pulmonary function studies. Of note, her weight is neutral over the last one year.       PLAN>> I suspect, with the patient's long history of tobacco abuse, that she probably does have some degree of chronic airflow obstruction. It is unclear how much asthma may be playing a role here. She has had multiple episodes this year that required antibiotics and prednisone, and I wonder how much of this is related to her underlying lung issues or  possibly laryngopharyngeal reflux. She is having persistent reflux symptoms despite taking a proton pump inhibitor. She also describes cyclical coughing as well, which is typically an upper airway issue. I would like to check a chest x-ray today, and schedule her for full pulmonary function studies. I would also like to treat her reflux more aggressively over the next 3-4 weeks. We'll also add Spiriva to her Symbicort to see if this helps   CXR 03/29/11 showed borderline heart size, ectasia of thoracic Ao, low flat diaphragms, essentially clear lungs, osteopenia & degen spondylosis in spine...  FullPFTs 05/17/11 showed FVC=3.11 (99%), FEV1=1.67 (74%), %1sec=54, mid-flows reduced at 18% predicted; no improvement in FEV1 post bronchodil; TLC=4.52 (84%), RV=1.41 (65%), RV/TLC=31%; DLCO=50% predicted.=> c/w mod airflow obstruction, borderline lung volumes, moderate decr in diffusion capacity...    CT Chest 07/12/13 per DrStoneking showed norm heart size, atherosclerotic changes in Ao & coronaries, no adenopathy; biapical pleuroparenchymal scarring & emphysema, scattered subpleural nodules- 68mm LUL, 93mm RUL, 20mm RML, 74mm RLL...   CT Chest 02/02/14 per DrStoneking showed norm heart size, calcif atherosclerotic Ao & coronaries, no adenopathy, centrilob & paraseptal emphysema noted, mult small nonspecific pulm nodules- 47mm in LUL, 77mm in lingula, 5mm in RUL, 76mm in RML and no new or enlarging pulm lesions (stable pulm nodules)...  ~  February 06, 2015:  Initial pulm eval by SN>  Pt referred by DrStoneking for progressive  dyspnea and abnormal CT Chest... The patient's granddaughter is DrStoneking's nurse.      Pt presents very concerned about her pulmonary situation having recently had a 3rd CT Chest & was told that they detected more & bigger pulmonary nodules & she is scared that she might have cancer; she brought a disc w/ CT Chest done 01/26/15 at [facility] & on my review the scan looks different than  prev- now w/ widespread patchy ground glass opac superimposed on her prev emphysema changes & mult small pulm nodules (now largely obscured by this interstitial process); I am concerned this new ILD may represent COP (cryptogenic organizing pneumonia- the new terminology for our prev BOOP diagnosis)... She is c/o cough, small amt beige sput w/o hemoptysis, notes SOB/DOE w/ exertion (she used to swim a lot but recently has been more sedentary- esp since knee surg 10/2014); notes SOB w/ housework x61mo & stress makes the SOB worse, been using her Symbicort & Spiriva just "as needed" she says; DrStoneking got her a nebulizer w/ Xopenex to use TID but she has run out of this med...       In the course of our conversations [name] tells me that she was prev diagnosed w/ Lupus by DrGates and DrLevitin years ago (1997) but we do not have any old data from this period (note from [place] 02/01/15 just mentioned lung nodules and PMHx has no mention of SLE, prev abn blood tests, prev Rheum eval, etc);  The pt volunteers that she had a pos ANA & was treated by DrLevitin w/ Pred intermittently over a 3 yr period;  ?why she stopped going & it was never mentioned again... She does admit to hx arthritis, hurting all over (esp knees), and she had arthroscopic knee surg by [facility] 10/26/14 "he scraped it out"; she had post op therapy  But she noted her breathing was worse...   Smoking Hx>  she is an ex-smoker, starting at age 3 & smoked for about 50 yrs up to 1.5ppd, & quit in 2003; est ~50-60 pack-yr smoking hx...  Pulmonary Hx>  She reports hx diphtheria as child, had lots of recurrent bronchitic infections in her adult life, she reports several bouts of pneumonia & wonders if these infections left her w/ scar tissue, seen by DrClance 2013 w/ remote hx asthma/ now modCOPD treated w/ Symbicort, Spriva, PPI & antireflux regimen;  ?she had sleep eval 2013 & was started on CPAP (?[facility], no records avail)...  Medical  Hx>  DrStoneking- HL, Obesity, GERD, Divertics, colon polyps, liver AVM, osteopenia, migraines, RLS, anxiety/depression...  Family Hx>  Hx asthma in her mother, otherw no FamHx lung problems...   Occup Hx>  No known occupational exposures- asbestos, chemical toxins, no recent travel or birds...  Current Meds>  Symbicort160, Spiriva, Xopenex neb, Mucinex, Claritin10, ASA81, Lisinopril5, Fish Oil, Prilosec20-2Bid, Celexa40, calcium/ vitamins... EXAM - Afeb, VSS, O2sat=92% on RA;  Obese at 251#, 5'7"Tall, BMI=39;  HEENT- neg, mallampati2;  Chest- decr BS bilat, few basilar rales, no wheezing or consolidation;  Heart- RR gr1-2 SEM w/o r/g;  Abd- obese soft neg;  Ext- w/o c/c/e  CT Chest 01/26/15 by [facility]- report by Dr. Anabel Halon- indicates extensive bilat pulm fibrosis, mult pulm masses & nodules- neoplasia cannot be excluded, mult nodes in mediastinum- mostly wnl in size... To my review this shows widespread patchy ground glass opac superimposed on her prev emphysema changes & mult small pulm nodules (now largely obscured by this interstitial process);  I am concerned this new ILD may represent COP.   CXR 02/06/15 showed norm heart size, lungs appear hyperinflated w/ coarsened interstitial markings bilat & an opacity at left lung base (all this new compared to last CXR 2012)...  Spirometry 02/06/15 showed FVC=1.97 (62%), FEV1=1.40 (60%), %1sec=71%, mid-flows are reduced at 49% predicted;  This is suggestive of mixed mild obstructive & mod restrictive dis...  Ambulatory oxygen saturation test 01/2015> O2sat=92% on RA at rest w/ pulse=95/min;  She ambulated in office only 1 lap w/ lowest O2sat=88% w/ pulse=124/min...  LABS 01/2015>  Chems- wnl;  CBC- wnl x WBC=19.5;  TSH=6.41;  Collagen-vasc screen:  ACE=10 (nl<52), RheumFactor=100 (Hi pos >42), Anti-CCP=213 (strong pos >59), ANA pos 1:320 homogeneous pattern, ANCA screen= NEG (MPO & PR-3), Sed=54, CRP=4.5.Marland KitchenMarland Kitchen IMP/PLAN >>       New ILD per  CXR & CT Chest- c/w COP/BOOP>  The totality of the eval including labs looks like this could be Rheumatoid lung dis; we will refer her to [facility] ASAP & start Pred rx...      Underlying COPD/emphysema, former smoker>  She is on Symbicort160-2spBid, Spiriva daily, Xopenex nebs Tid, Mucinex and rec to take meds regularly, NOT prn...      Hx mult small pulmonary nodules seen on prev CT scans in 2013>  This will need to be followed w/ serial CXR/ CT scans...      OSA- eval & Rx per [facility], no data avail in Epic>  Per DrStoneking & Maxwell Caul...      GERD/ prob LPR>  She needs a vigorous antireflux regimen w/ PPI Bid, NPO after dinner, elev HOB 6"; DrMJohnson is her GI...      Abn collagen-vasc screen w/ +RA, +Anti-CCP, +ANA, Sed54>  She was prev treated by DrLevitin for Lupus 1997-2000, labs look like RA & we will refer pt to [facility] for Rheum eval at this time...  ~  March 14, 2015:  4mo ROV w/ SN>        At the time of our initial visit we started PRED40 x2wks=> 20 x2wks w/ f/u appt 65mo;  She saw [facility] for Rheum in the interval but we don't have note from her to review;  Pt tells me that she has Hx OA w/ prev knee arthroscopy by DrCaffrey & presist w/ lots of knee pain, but she claims that while she was taking the Pred20 she suddenly developed severe pain & swelling in her hands, wrists and ankles=> called DrHawkes who wanted to get her off the Pred & weaned her down further to 10mg /d- recall that we started the Pred for prob Rheumatoid Lung w/ CT scan suggesting COP/BOOP pattern;  She further indicates to me that she fell out of bed 2wks ago- bruised leg & arm notes it's hard to walk w/ pain in feet & legs;  She called DStoneking's office & was switched from Aleve to Tylenol;  It got so bad she went to the ER yest=> given Tramadol & told to f/u here;  She has a follow up appt w/ [facility] tomorrow 03/15/15...      From the pulmonary standpoint she is about the same, but the pain in her hands/  wrist has been her main complaint for the last week or so;  She remains on Symbicort160-2spBid, Spiriva daily, NEBS w/ Xopenex tid, Mucinex prn;  She has had trouble doing the inhalers since her hands &wrist swelled up, pain w/ actuation;  She is SOB & O2sat=90% on RA today in office;  Notes sl dry cough, no sput, no hemoptysis, no CP...       EXAM shows Afeb, VSS, O2sat=90% on RA;  HEENT- neg, mallampati2;  Chest- decr BS bilat, few basilar rales, no wheezing or consolidation;  Heart- RR gr1-2 SEM w/o r/g;  Abd- obese soft neg;  Ext- swollen/ tender wrists & hands!  CXR 03/13/15 in ER> mild cardiomeg, COPD/ hyperinflation, prominent lung markings bilat, no acute changes...   EKG 03/13/15 in ER> sinus tachy, rate 106/min, NSSTTWA, NAD...  Ambulatory O2sat test 03/14/15> O2sat=91% on RA at rest w/ pulse=86/min; on standing w/ min walk O2sat dropped to 85% w/ pulse=106/min... IMP >>      New ILD per CXR & CT Chest- c/w COP/BOOP, prob related to Reumatoid Lung>  We started Pred 40=>20 but this was further cut to 10mg /d by [facility] in the interval; pulm status is clearly no better, ?sl worse, and she needs HOME O2 to be started rec 1-2L/min rest & 2L/min exercise => hypoxemic resp failure...      Underlying COPD/emphysema, former smoker>  She is on Symbicort160-2spBid, Spiriva daily, Xopenex nebs Tid, Mucinex and rec to take meds regularly everyday...      Hx mult small pulmonary nodules seen on prev CT scans in 2015> recent CT 01/2015 showed these nodules were masked by the ground glass opac & interstitial process; this will need to be followed w/ serial CXR/ CT scans...      OSA- eval & Rx per [facility], no data avail in Epic>  Per DrStoneking & Maxwell Caul...      GERD/ prob LPR>  She needs a vigorous antireflux regimen w/ PPI Bid, NPO after dinner, elev HOB 6"; DrMJohnson is her GI...      Abn collagen-vasc screen w/ +RA, +Anti-CCP, +ANA, Sed54> Looks like RA-  She was prev treated by DrLevitin for  Lupus 1997-2000;  She was referred to [facility] for Rheum & her note is pending... PLAN >>       She is asked to keep the Pred at 10mg  per day for now due to her pulmonary process;  She has ROV tomorrow w/ Rheum for follow up & to eval her acute hand, wrist, foot arthritic process;  We are starting HOME O2 due to her acute hypoxemic resp failure... We will follow up in 54month, sooner if needed. ADDENDUM>> Pt seen by [facility] 11/30-- seropos RA, OA of both knees, ILD;  Insurance won't cover TNFs therefore started on ARAVA20 and Pred increased to 20mg /d... ADDENDUM>> Full PFTs 03/30/15:  FVC=2.23 (73%), FEV1=1.50 (65%), %1sec=68, mid-flows reduced at 51% predicted; post bronchodil the FEV1 improved 6%; TLC=4.13 (77%), RV=1.80 (75%), RV/TLC=44; DLCO=50% predicted;  These PFTs are compatible w/ mild restriction and a superimposed mild obstructive defect w/ a mod-severe reduction is DLCO...  ~  May 24, 2015:  58mo ROV w/ SN>  [name] notes that he breathing is improved w/ her home O2 therapy but she would like an Inogen One POC & we will request this from her DME;  She notes SOB/DOE stable- no progression, and mild dry cough w/o sput, no CP etc... She has remained on Pred per [facility], no recent notes scanned into Epic; on Pred20/d + Arava and she is checked Q59mo w/ slow Pred taper...      ILD/ pulm fibrosis per CXR & CT Chest- c/w COP/BOOP, prob related to Reumatoid Lung>  On Pred & Arava per DrHawkes, Rheum w/ freq OVs and slow taper of the Pred.Marland KitchenMarland Kitchen  Underlying COPD/emphysema, former smoker>  on Symbicort160-2spBid, Spiriva daily, Xopenex nebs Tid, Mucinex and rec to take meds regularly everyday...      Hypoxemic resp failure>  On Home O2 at 2L/min w/ O2sat=97% today in office; she is requesting a smaller POC- wants the Inogen One...      Hx mult small pulmonary nodules seen on prev CT scans in 2015> recent CT 01/2015 at [place] ([facility]) showed these nodules were masked by the ground glass opac &  interstitial process; CXR 11/16 w/ incr interstitial markings.      OSA- eval & Rx per [facility], no data avail in Epic>  per DrStoneking & Maxwell Caul...      GERD/ prob LPR>  She needs a vigorous antireflux regimen w/ PPI (Prilosec40) Bid, NPO after dinner, elev HOB 6"; DrMJohnson is her GI...      Abn collagen-vasc screen w/ +RA, +Anti-CCP, +ANA, Sed54> Looks like RA-  She was prev treated by DrLevitin for Lupus 1997-2000;  She saw [facility] 02/2015 & started on ARAVA while slowly weaning Pred. EXAM shows Afeb, VSS, O2sat=97% on 2L/min pulse dose;  Wt=267#, 5'7"Tall, BMI=40;  HEENT- neg, mallampati2;  Chest- decr BS bilat, few basilar rales, no wheezing or consolidation;  Heart- RR gr1-2 SEM w/o r/g;  Abd- obese soft neg;  Ext- swollen/ sl tender wrists & hands! IMP/PLAN>>  She is slowly weaning the Pred per Rheum & taking the Arava20;  We will request a POC device for her;  We plan ROV in 79mo w/ f/u CXR/ CT Chest...  ~  July 24, 2015:  41mo ROV and Melvie notes that her breathing is stable> she is an ex-smoker w/ mixed obstructive (COPD/emphysema) & restrictive (ILD w/ prob RA and obesity) lung dis, chronic hypoxemic resp failure on HomeO2;  She has been using her Symbocort160-2spBid, Spiriva daily, Mucinex600- one tab daily; she has not been using her nebulizer and notes that her breathing is good;  She is followed by DrHawkes Q40mo on Arava & slow Pred taper- currently on 10mg /d...      ILD/ pulm fibrosis per CXR & CT Chest- c/w COP/BOOP, prob related to Reumatoid Lung>  On Pred (currently 10mg /d) & Arava per DrHawkes, Rheum w/ freq OVs and slow taper of the Pred...      Underlying COPD/emphysema, former smoker>  on Symbicort160-2spBid, Spiriva daily, Xopenex nebs Tid (only using this prn), Mucinex and rec to take meds regularly everyday...      HEx-induced hypoxemic resp failure>  On Home O2 at 2L/min w/ O2sat=97% on 2L in office today on her new "simply go" POC...       Hx mult small pulmonary  nodules seen on prev CT scans in 2015> CT 01/2015 at [place] ([facility]) showed these nodules were masked by the ground glass opac & interstitial process; CXR 11/16 w/ incr interstitial markings.      OSA- eval & Rx per [facility], no data avail in Epic>  per DrStoneking & Maxwell Caul...      GERD/ prob LPR>  She needs a vigorous antireflux regimen w/ PPI (Prilosec40) Bid, NPO after dinner, elev HOB 6"; DrMJohnson is her GI...      Abn collagen-vasc screen w/ +RA, +Anti-CCP, +ANA, Sed54> Looks like RA-  She was prev treated by DrLevitin for Lupus 1997-2000;  She saw [facility] 02/2015 & started on ARAVA while slowly weaning Pred. EXAM shows Afeb, VSS, O2sat=97% on 2L/min pulse dose;  Wt=267#, 5'7"Tall, BMI=42;  HEENT- neg, mallampati2;  Chest- decr BS bilat,  few basilar rales, no wheezing or consolidation;  Heart- RR gr1-2 SEM w/o r/g;  Abd- obese soft neg;  Ext- much improved wrists & hands  CT Chest => done 07/28/15> norm heart size, atherosclerotic changes in Ao & LAD, mod centrilobular & paraseptal emphysema bilaterally, mult pulmonary nodules are unchanged from 06/2013 scan and felt c/w benign process, NO MENTION MADE OF INTERSTITIAL PROCESS SEEN ON CT CHEST 01/26/15 from [facility] => now largely resolved...    IMP/PLAN>>  [name]'s breathing is stable, at her new baseline & we will proceed w/ new CT Chest to compare to the old; slow Pred taper per rheum and she desperately needs to get on diet & get weight down; continue O2, Symbicort, Spiriva, etc...  ~  January 22, 2016:  62mo ROV w/ SN>  [name] reports an acute exac of her COPD starting several days ago w/ cough, greenish sput production, mod chest congestion, no hemoptysis, sl incr SOB but no CP/ palpait/ edema; she remains on Home O2 at 1-2L/min, NEBS w/ Xopenex Tid, Symbicort160-2spBid, Spiriva daily; Her Pred was boosted to 12.5mg /d in August by [facility] for an RA flair at that time & she continues on the [place] daily;  We discussed treatment for  this AB exac w/ Levaquin + probiotic...       ILD/ pulm fibrosis per CXRs & prev CT Chest- c/w COP/BOOP, prob related to Reumatoid Lung>  On Pred (currently 12.5mg /d per DrHawkes) & Arava, Rheum w/ freq OVs and slow taper of the Pred planned...      Underlying mixed COPD w/ emphysema & chr bronchitis, former smoker>  on Symbicort160-2spBid, Spiriva daily, Xopenex nebs Tid (only using this prn), Mucinex and rec to take meds regularly everyday...      Hx ex-induced hypoxemic resp failure>  On Home O2 at 2L/min w/ O2sat=97% on 2L in office today on her new "simply go" POC...       Hx mult small pulmonary nodules seen on prev CT scans in 2015> CT 01/2015 at [place] ([facility]) showed these nodules were masked by the ground glass opac & interstitial process; CXR 11/16 w/ incr interstitial markings; f/u CT 07/2015 showed stable pulm nodules and resolution of the interstitial process prev seen...      OSA- eval & Rx per [facility], no data avail in Epic>  per DrStoneking & Maxwell Caul...      Obesity>  [name] weighs ~270#, 5'7"Tall, BMI=42; she has been counseled on DIET/ EXERCISE/ wt reducing strategies...      GERD/ prob LPR>  She needs a vigorous antireflux regimen w/ PPI (Prilosec40) Bid, NPO after dinner, elev HOB 6"; DrMJohnson is her GI...      Abn collagen-vasc screen w/ +RA, +Anti-CCP, +ANA, Sed54> +RA-  She was prev treated by DrLevitin for Lupus 1997-2000;  She saw [facility] 02/2015 & started on ARAVA while slowly weaning Pred. EXAM shows Afeb, VSS, O2sat=100% on 1L/min pulse dose;  Wt=268#, 5'7"Tall, BMI=40;  HEENT- neg, mallampati2;  Chest- decr BS bilat, few basilar rales, no wheezing or consolidation;  Heart- RR gr1-2 SEM w/o r/g;  Abd- obese soft neg;  Ext- much improved wrists & hands IMP/PLAN>>  I placed Lakenya on Levaquin for this bronchitic flair & rec that she take a probiotic like Align while on the antibiotic;  She will continue her O2, Prednisone, Symbicort, Spiriva, & Mucinex;  Reminded  about the recommended antireflux regimen;  Also reminded about diet/ exercise & wt reduction strategies...  ~  July 22, 2016:  1mo ROV & pulmonary recheck> Her PCP is DrStoneking, & Rheum- DrHawkes=> outpt rehab for difficullty walking...  She states her breathing is good, no change; notes incr SOB w/ activities but ADLs are still ok (eg-  Vacuums and takes her time, but stairs are bad)... On O2 at 1L/min rest & 2L/min activity; current Pred taper, Symbicort160-2spBid, Spiriva once daily,  XOPENEX NEBS but not using Tid- just does this Prn she says & "hasn't needed";  She has Mucinex & Claritin for as needed use too;  She remains on ARAVA per [facility]...    EXAM shows Afeb, VSS, O2sat=96% on 1L/min pulse dose;  Wt=276#, 5'7"Tall, BMI=43;  HEENT- neg, mallampati2;  Chest- decr BS bilat, few basilar rales, no wheezing or consolidation;  Heart- RR gr1-2 SEM w/o r/g;  Abd- obese soft neg;  Ext- much improved wrists & hands.  CXR 07/22/16>  Norm heart size, hyperinflation c/w COPD, mild chr interstitial thickening- no focal abn & NAD...  IMP/PLAN>>  Dashanae appears stable but must desperately lose the weight!  She requests a Rx for [facility] to keep on hand & we discussed this- must DIET/ EXERCISE/ get wt down! Continue same meds...  ~  November 12, 2016:  21mo ROV & [name] called for an acute visit- add on appt due to a 4d hx CP & SOB, notes substernal CP that is incr w/ a deep breath/ movement/ & thru to her back; her SOB is a sensation of not getting a deep breath;  I note that her weight is down 9# to 267# today & she is watching her intake but still not exercising; notes mild cough, small amt clear sput, no hemoptysis, no f/c/s, no edema etc... She remains on Rx w/ O2 at 2L/min w/ activ & 1L/min rest; NEBs w/ Xopenex Tid; Symbicort160-2spBid & Spiriva once daily; Mucinex600 once daily; Levaquin for prn use;  She remains on Pred7.5mg /d, Plaquenil200Bid, & Arava per [facility] (last note 07/09/16);  She remains on  Alendronate70, Betances per DrStoneking, but off Lipitor due to leg cramps...  We discussed further eval w/ f/u CXR, EKG, ambulatory oximetry today>>    EXAM shows Afeb, VSS, O2sat=95% on 1L/min pulse dose;  Wt=267#, 5'7"Tall, BMI=42;  HEENT- neg, mallampati2;  Chest- decr BS bilat, few basilar rales, no wheezing or consolidation;  Heart- RR gr1-2 SEM w/o r/g;  Abd- obese soft neg;  Ext- much improved wrists & hands.  CXR 11/12/16 (independently reviewed by me in the PACS system) showed heart at upper lim of norm, hyperinflation c/w COPD, chr interstitial prominence (stable)- NAD.Marland KitchenMarland Kitchen   EKG 11/12/16 shows NSR, rate75, wnl...  Ambulatory Oximetry 11/12/16 on 1L/min showed O2sat=94% at rest w/ pulse=85/min;  She ambulated only 2 Laps (185'ea) on 1L/min w/ lowest O2sat=89% w/ heart rate= 95/min;  She could not go further due to leg fatigue...  IMP/PLAN>>  [name] seems to have developed some CWP/ inflamm & has assoc SOB/DOE fom this & chest wall factors- her COPD, ?rheumatoid interstitial dis, etc- all appears stable;  She is rec to REST, apply HEAT, & use Tramadol50 + Tylenol for the pain;  She will continue her current breathing meds regularly & over the longer term must get on diet, incr exercise, & must lose weight... She will call for any worsening symptoms and f/u w/ [place] in 2-80mo...  ~  January 21, 2017:  28mo ROV & pulm f/u visit>  [name] reports a good interval- her CWP/inflamm resolved w/ the rest, heat, Tramadol; and her assoc  SOB has resolved; min cough, no sput at present; she tells me that she has stopped doing her NEBS w/ Xopenex (stopped on her own) but she has continued the Symbicort160-2spBid & Spiriva daily regularly;  She also has Home O2 & received a smaller simply Go Mini last year but now she tells me "I don't use it when I go outside" (supposed to be on 1L/min rest & 2L/min exercise);  In addition Keilin has not lost any weight- still at 268# w/ BMI=42- we again reviewed the importanceof  using her O2 w/ exercise, getting on diet & getting her weight down!  We reviewed the following medical problems during today's office visit >>       ILD/ pulm fibrosis per CXRs & prev CT Chest- c/w COP/BOOP, prob related to Reumatoid Lung>  On Pred (currently 7.5mg /d per DrHawkes) + Plaquenil & Arava per Rheum w/ freq OVs and slow taper of the Pred planned.      Underlying mixed COPD w/ emphysema & chr bronchitis, former smoker>  on Symbicort160-2spBid, Spiriva daily, Xopenex nebs Tid (only using this prn), Mucinex and rec to take meds regularly everyday...      Hx ex-induced hypoxemic resp failure>  On Home O2 at 2L/min w/ O2sat=97% on 2L in office today on her new "simply go" POC...       Hx mult small pulmonary nodules seen on prev CT scans in 2015> CT 01/2015 at [place] ([facility]) showed these nodules were masked by the ground glass opac & interstitial process; CXR 11/16 w/ incr interstitial markings; f/u CT 07/2015 showed stable pulm nodules and resolution of the interstitial process prev seen...      OSA- eval & Rx per [facility], no data avail in Epic>  per DrStoneking & Maxwell Caul...      Obesity>  Teiara weighs ~268#, 5'7"Tall, BMI=42; she has been counseled on DIET/ EXERCISE/ wt reducing strategies...      GERD/ prob LPR>  She needs a vigorous antireflux regimen w/ PPI (Prilosec40) Bid, NPO after dinner, elev HOB 6"; DrMJohnson is her GI...      Abn collagen-vasc screen w/ +RA, +Anti-CCP, +ANA, Sed54>  She was prev treated by DrLevitin for Lupus 1997-2000;  She saw [facility] 02/2015 & started on ARAVA while slowly weaning Pred. EXAM shows Afeb, VSS, O2sat=97% on 2L/min pulse dose;  Wt=268#, 5'7"Tall, BMI=40;  HEENT- neg, mallampati2;  Chest- decr BS bilat, few basilar rales, no wheezing or consolidation;  Heart- RR gr1-2 SEM w/o r/g;  Abd- obese soft neg;  Ext- much improved wrists & hands IMP/PLAN>>  Problems as noted and she is stable on current regimen- Pred/ Plaquenil/ Arava per Rheum and  O2, NEBS, Symbicort/ Spiriva, and Mucinex from [place];  We again reviewed the imperative to lose weight thru diet/ exercise/ etc...    ~  May 26, 2017:  55mo ROV and pulmonary follow up visit>  [name] is a 77y/o F w/ hx seropos RA, underlying pulm fibrosis & mixed COPD (chr bronchitis & emphysema), ex-smoker w/ ~50 pack-yr hx & quit ~2003, hypoxemic resp failure on Home O2 at 2L/min, OSA on CPAP per [facility], morbid obesity, sedentary lifestyle & deconditioning;  She is also quite anxious & this contributes another layer to her chr dyspnea...  She reports that she had an RA "flair-up" about 8mo ago & DrHawkes increased her Pred, continued the [place] & adjusted her Plaquenil;  She was also having some incr RLS symptoms and DrStoneking started her on Mirapex (  we do not have notes from these providers)...  We reviewed the following medical problems during today's office visit>        ILD/ pulm fibrosis per CXRs & prev CT Chest c/w COP/BOOP & prob underlying Reumatoid Lung>  On Pred (currently 7.5mg /d per DrHawkes) + Plaquenil & Arava per Rheum w/ freq OVs and slow taper of the Pred per Rheum... Last CXR 10/2016 showed heart at upper lim of norm, hyperinflation c/w COPD, chr interstitial prominence (stable)- NAD      Underlying mixed COPD w/ emphysema & chr bronchitis, former smoker quit 2003>  on Symbicort160-2spBid, Spiriva daily, Xopenex nebs Tid (only using this prn), Mucinex and rec to take meds regularly everyday...      Hx ex-induced hypoxemic resp failure>  On Home O2 at 2L/min w/ O2sat=96% on 1L in office today on her new "simply go" POC...       Hx mult small pulmonary nodules seen on prev CT scans in 2015> CT 01/2015 at [place] ([facility]) showed these nodules were masked by the ground glass opac & interstitial process; CXR 11/16 w/ incr interstitial markings; f/u CT 07/2015 showed stable pulm nodules and resolution of the interstitial process prev seen...      OSA- eval & Rx per  [facility], no data avail in Epic>  per DrStoneking & Maxwell Caul...      Obesity>  [name] weighs ~282# now, 5'7"Tall, BMI=44; she has been counseled on DIET/ EXERCISE/ wt reducing strategies & she understands that weight reduction is critical to her improving her breathing...      GERD/ prob LPR>  She needs a vigorous antireflux regimen w/ PPI (Prilosec40) Bid, NPO after dinner, elev HOB 6"; DrMJohnson is her GI...      Abn collagen-vasc screen w/ +RA, +Anti-CCP, +ANA, Sed54>  She was prev treated by DrLevitin for Lupus 1997-2000;  She saw [facility] 02/2015 & started on ARAVA while slowly weaning Pred. EXAM shows Afeb, VSS, O2sat=96% on 1L/min pulse dose;  Wt=282#, 5'7"Tall, BMI=44;  HEENT- neg, mallampati2;  Chest- decr BS bilat, few basilar rales, no wheezing or consolidation;  Heart- RR gr1-2 SEM w/o r/g;  Abd- obese soft neg;  Ext- much improved wrists & hands  NO RECENT LAB DATA TO REVIEW IN Epic> pt indicates that labs done by DrStoneking & DrHawkes IMP/PLAN>>  [name] has mult medical issues and a good bit of anxiety & stress;  Her RA & mild underlying ILD is controlled w/ Pred/ Arava/ Plaquenil from [facility];  Her mixed COPD is treated w/ NEBS, Symbicort, Spiriva, Mucinex;  Her hypoxemic resp failure w/ O2;  Her OSA w/ CPAP qhs from [place];  She is morbidly obese & desperately needs to lose some weight- we reviewed DIET, EXERCISE, wt reduction strategies & she should review this further w/ her PCP (they also monitor her labs), she is way too sedentary & deconditioned!  Finally she is quite anxious & under some stress- hopefully she will be able to de-stress her life, and that the [facility] 0.5mg  - 1/2 to 1 tab bid will also provide a measure of relief...    Past Medical History:  Diagnosis Date  . Allergic rhinitis   . Asthmatic bronchitis   . Colon polyps   . COPD (chronic obstructive pulmonary disease) ([place])   . Diverticulosis   . GERD (gastroesophageal reflux disease)   .  H/O: GI bleed   . Hypercholesterolemia >> PCP is DrStoneking   . Migraine headache   . Obesity   .  RLS (restless legs syndrome)    Osteoarthritis    RHEUMATOID ARTHRITIS >> followed by [facility]     Past Surgical History:  Procedure Laterality Date  . TONSILLECTOMY  x2  . TOTAL ABDOMINAL HYSTERECTOMY  02/17/1989    Outpatient Encounter Medications as of 05/26/2017  Medication Sig  . acetaminophen (TYLENOL) 500 MG tablet Take 1,000 mg by mouth every 6 (six) hours as needed for moderate pain.  . Ascorbic Acid (VITAMIN C) 1000 MG tablet Take 1,000 mg by mouth daily.  Marland Kitchen aspirin 81 MG tablet Take 81 mg by mouth every other day.  . budesonide-formoterol (SYMBICORT) 160-4.5 MCG/ACT inhaler Inhale 2 puffs into the lungs 2 (two) times daily.  . Calcium Carbonate-Vitamin D (CALCIUM + D) 600-200 MG-UNIT TABS Take 1 tablet by mouth daily.    . citalopram (CELEXA) 40 MG tablet Take 40 mg by mouth daily.  Marland Kitchen guaiFENesin (MUCINEX) 600 MG 12 hr tablet Take 600 mg by mouth daily.   . hydroxychloroquine (PLAQUENIL) 200 MG tablet Take 200 mg by mouth 2 (two) times daily.  . Leflunomide (ARAVA PO) Take by mouth daily.  Marland Kitchen levalbuterol (XOPENEX) 1.25 MG/3ML nebulizer solution Take 1.25 mg by nebulization 3 (three) times daily. (Patient taking differently: Take 1.25 mg by nebulization 3 (three) times daily as needed. )  . lisinopril (PRINIVIL,ZESTRIL) 5 MG tablet Take 5 mg by mouth daily.   Marland Kitchen loratadine (CLARITIN) 10 MG tablet Take 10 mg by mouth daily.    . Omega-3 Fatty Acids (FISH OIL) 1200 MG CAPS Take 1 capsule by mouth daily.    Marland Kitchen omeprazole (PRILOSEC) 40 MG capsule Take 1 capsule by mouth 2 (two) times daily.  . OXYGEN Inhale into the lungs. 2 lpm with rest and exertion  . pramipexole (MIRAPEX) 0.25 MG tablet Take 0.25 mg by mouth daily as needed (restless leg).  . predniSONE (DELTASONE) 5 MG tablet Take 10 mg by mouth daily with breakfast.   . traMADol (ULTRAM) 50 MG tablet Take 1/2 to 1 tablet TID  with Extra Strength Tylenol  . valACYclovir (VALTREX) 1000 MG tablet Take 1,000 mg by mouth daily as needed (for breakouts).   . [DISCONTINUED] Tiotropium Bromide Monohydrate (SPIRIVA RESPIMAT) 1.25 MCG/ACT AERS Inhale 2 puffs into the lungs daily.  . Tiotropium Bromide Monohydrate (SPIRIVA RESPIMAT) 1.25 MCG/ACT AERS Inhale 2 puffs into the lungs daily.   No facility-administered encounter medications on file as of 05/26/2017.     Allergies  Allergen Reactions  . Shellfish Allergy Anaphylaxis    With vomiting and diarrhea  . Macrolides And Ketolides   . Penicillins     Facial numbness  . Sertraline Hcl   . Sulfa Antibiotics     Facial numbness    Immunization History  Administered Date(s) Administered  . Influenza, High Dose Seasonal PF 01/17/2016, 01/14/2017  . Influenza-Unspecified 12/30/2014  . Pneumococcal Conjugate-13 07/07/2013  . Pneumococcal Polysaccharide-23 03/03/2009  . Zoster Recombinat (Shingrix) 12/14/2016    Current Medications, Allergies, Past Medical History, Past Surgical History, Family History, and Social History were reviewed in [employer] record.   Review of Systems             All symptoms NEG except where BOLDED >>  Constitutional:  F/C/S, fatigue, anorexia, unexpected weight change. HEENT:  HA, visual changes, hearing loss, earache, nasal symptoms, sore throat, mouth sores, hoarseness. Resp:  cough, sputum, hemoptysis; SOB, tightness, wheezing. Cardio:  CP, palpit, DOE, orthopnea, edema. GI:  N/V/D/C, blood in stool; reflux, abd pain,  distention, gas. GU:  dysuria, freq, urgency, hematuria, flank pain, voiding difficulty. MS:  joint pain, swelling, tenderness, decr ROM; neck pain, back pain, etc. Neuro:  HA, tremors, seizures, dizziness, syncope, weakness, numbness, gait abn. Skin:  suspicious lesions or skin rash. Heme:  adenopathy, bruising, bleeding. Psyche:  confusion, agitation, sleep disturbance, hallucinations,  anxiety, depression suicidal.   Objective:   Physical Exam       Vital Signs:  Reviewed...   General:  WD, obese, 77 y/o WF in NAD; alert & oriented; pleasant & cooperative... HEENT:  [employer]/AT; Conjunctiva- pink, Sclera- nonicteric, EOM-wnl, PERRLA, EACs-clear, TMs-wnl; NOSE-clear; THROAT-clear & wnl.  Neck:  Supple w/ fair ROM; no JVD; normal carotid impulses w/o bruits; no thyromegaly or nodules palpated; no lymphadenopathy.  Chest:  Decr BS bilat, few basilar rales, no wheezing rhonchi or signs of consolidation... Heart:  Regular Rhythm; norm S1 & S2 gr1-2/6 SEM, w/o rubs or gallops detected. Abdomen:  Soft & nontender- no guarding or rebound; normal bowel sounds; no organomegaly or masses palpated. Ext:  decrROM; without deformities +arthritic changes; no varicose veins, +venous insuffic, tr edema;  Pulses intact w/o bruits. Neuro:  No focal neuro deficits; sensory testing normal; gait OK & balance OK. Derm:  No lesions noted; no rash etc. Lymph:  No cervical, supraclavicular, axillary, or inguinal adenopathy palpated.   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      Underlying COPD/emphysema, former smoker>  on Symbicort160-2spBid, Spiriva daily, Xopenex nebs Tid (only using this prn), Mucinex and rec to take meds regularly everyday...      Hypoxemic resp failure>  On Home O2 at 2L/min w/ O2sat=97% on 2L in office today on her new "simply go" POC...       Hx mult small pulmonary nodules seen on prev CT scans in 2015> CT 01/2015 at [place] ([facility]) showed these nodules were masked by the ground glass opac & interstitial process; CXR 11/16 w/ incr interstitial markings.      OSA- eval & Rx per [facility], no data avail in Epic>  per DrStoneking & Maxwell Caul...      GERD/ prob LPR>  She needs a vigorous antireflux  regimen w/ PPI (Prilosec40) Bid, NPO after dinner, elev HOB 6"; DrMJohnson is her GI...      Abn collagen-vasc screen w/ +RA, +Anti-CCP, +ANA, Sed54> Looks like RA-  She was prev treated by DrLevitin for Lupus 1997-2000;  She saw [facility] 02/2015 & started on ARAVA while slowly weaning Pred.  PLAN >>  02/10/15>  Start Pred 20mg  tabs- 40mg  Qam for 2 wks, then 20mg /d til return;  Refer to [place], ASAP;  Continue other meds reglarly (Symbicort, Spiriva, Xopenex, Mucinex);  Needs vigorous antireflux regimen... 03/14/15>  She is asked to keep the Pred at 10mg  per day for now due to her pulmonary process;  She has ROV tomorrow w/ Rheum for follow up & to eval her acute hand, wrist, foot arthritic process;  We are starting HOME O2 due to her acute hypoxemic resp failure...  05/24/15>   She has established w/ DrHawkes for Rheum on slow Pred taper + Arava;  Breathing is stable on O2, Pred, XopenexTid, Symbicort160, Spiriva, and Mucinex... 07/24/15>   [name]'s breathing is stable, at her new baseline & we  will proceed w/ new CT Chest to compare to the old; slow Pred taper per rheum and she desperately needs to get on diet & get weight down; continue O2, Symbicort, Spiriva, etc... 01/22/16>   I placed [name] on Levaquin for this bronchitic flair & rec that she take a probiotic like Align while on the antibiotic;  She will continue her O2, Prednisone, Symbicort, Spiriva, & Mucinex;  Reminded about the recommended antireflux regimen;  Also reminded about diet/ exercise & wt reduction strategies 11/12/16>   [name] seems to have developed some CWP/ inflamm & has assoc SOB/DOE fom this & chest wall factors- her COPD, ?rheumatoid interstitial dis, etc- all appears stable;  She is rec to REST, apply HEAT, & use Tramadol50 + Tylenol for the pain;  She will continue her current breathing meds regularly & over the longer term must get on diet, incr exercise, & must lose weight... She will cll for any worsening symptoms and f/u w/ [place]  in 2-34mo. 01/21/17>   Problems as noted and she is stable on current regimen- Pred/ Plaquenil/ Arava per Rheum and O2, NEBS, Symbicort/ Spiriva, and Mucinex from [place];  We again reviewed the imperative to lose weight thru diet/ exercise/ etc 05/26/17>   Khiev has mult medical issues and a good bit of anxiety & stress;  Her RA & mild underlying ILD is controlled w/ Pred/ Arava/ Plaquenil from [facility];  Her mixed COPD is treated w/ NEBS, Symbicort, Spiriva, Mucinex;  Her hypoxemic resp failure w/ O2;  Her OSA w/ CPAP qhs from [place];  She is morbidly obese & desperately needs to lose some weight- we reviewed DIET, EXERCISE, wt reduction strategies & she should review this further w/ her PCP (they also monitor her labs), she is way too sedentary & deconditioned!  Finally she is quite anxious & under some stress- hopefully she will be able to de-stress her life, and that the [facility] 0.5mg  - 1/2 to 1 tab bid will also provide a measure of relief.   Plan:                                      HOME O2 at 1-2L/min at rest and 2L/min w/ activity... Patient's Medications  New Prescriptions   No medications on file  Previous Medications   ACETAMINOPHEN (TYLENOL) 500 MG TABLET    Take 1,000 mg by mouth every 6 (six) hours as needed for moderate pain.   ASCORBIC ACID (VITAMIN C) 1000 MG TABLET    Take 1,000 mg by mouth daily.   ASPIRIN 81 MG TABLET    Take 81 mg by mouth every other day.   BUDESONIDE-FORMOTEROL (SYMBICORT) 160-4.5 MCG/ACT INHALER    Inhale 2 puffs into the lungs 2 (two) times daily.   CALCIUM CARBONATE-VITAMIN D (CALCIUM + D) 600-200 MG-UNIT TABS    Take 1 tablet by mouth daily.     CITALOPRAM (CELEXA) 40 MG TABLET    Take 40 mg by mouth daily.   GUAIFENESIN (MUCINEX) 600 MG 12 HR TABLET    Take 600 mg by mouth daily.    HYDROXYCHLOROQUINE (PLAQUENIL) 200 MG TABLET    Take 200 mg by mouth 2 (two) times daily.   LEFLUNOMIDE (ARAVA PO)    Take by mouth daily.   LEVALBUTEROL  (XOPENEX) 1.25 MG/3ML NEBULIZER SOLUTION    Take 1.25 mg by nebulization 3 (three) times daily.  LISINOPRIL (PRINIVIL,ZESTRIL) 5 MG TABLET    Take 5 mg by mouth daily.    LORATADINE (CLARITIN) 10 MG TABLET    Take 10 mg by mouth daily.     OMEGA-3 FATTY ACIDS (FISH OIL) 1200 MG CAPS    Take 1 capsule by mouth daily.     OMEPRAZOLE (PRILOSEC) 40 MG CAPSULE    Take 1 capsule by mouth 2 (two) times daily.   OXYGEN    Inhale into the lungs. 2 lpm with rest and exertion   PRAMIPEXOLE (MIRAPEX) 0.25 MG TABLET    Take 0.25 mg by mouth daily as needed (restless leg).   PREDNISONE (DELTASONE) 5 MG TABLET    Take 10 mg by mouth daily with breakfast.    TIOTROPIUM BROMIDE MONOHYDRATE (SPIRIVA RESPIMAT) 1.25 MCG/ACT AERS    Inhale 2 puffs into the lungs daily.   TRAMADOL (ULTRAM) 50 MG TABLET    Take 1/2 to 1 tablet TID with Extra Strength Tylenol   VALACYCLOVIR (VALTREX) 1000 MG TABLET    Take 1,000 mg by mouth daily as needed (for breakouts).   Modified Medications   No medications on file  Discontinued Medications   TIOTROPIUM BROMIDE MONOHYDRATE (SPIRIVA RESPIMAT) 1.25 MCG/ACT AERS    Inhale 2 puffs into the lungs daily.

## 2017-05-30 ENCOUNTER — Telehealth: Payer: Self-pay | Admitting: Pulmonary Disease

## 2017-05-30 DIAGNOSIS — J849 Interstitial pulmonary disease, unspecified: Secondary | ICD-10-CM

## 2017-05-30 NOTE — Telephone Encounter (Signed)
Called and spoke with patient, she is wanting to switch to inogen g3. Advised patient that per Clayborne Dana we need to send a message to [facility] and advise him that patient is wanting to switch so that we could do appropriate paperwork. SN please advise if we can do this.

## 2017-06-03 NOTE — Telephone Encounter (Signed)
SN please advise if ok to switch patient from Simply Go Mini to Inogen G3 POC.  Thanks!

## 2017-06-04 DIAGNOSIS — J439 Emphysema, unspecified: Secondary | ICD-10-CM | POA: Diagnosis not present

## 2017-06-04 DIAGNOSIS — G4733 Obstructive sleep apnea (adult) (pediatric): Secondary | ICD-10-CM | POA: Diagnosis not present

## 2017-06-04 DIAGNOSIS — M05741 Rheumatoid arthritis with rheumatoid factor of right hand without organ or systems involvement: Secondary | ICD-10-CM | POA: Diagnosis not present

## 2017-06-04 DIAGNOSIS — J449 Chronic obstructive pulmonary disease, unspecified: Secondary | ICD-10-CM | POA: Diagnosis not present

## 2017-06-04 DIAGNOSIS — J849 Interstitial pulmonary disease, unspecified: Secondary | ICD-10-CM | POA: Diagnosis not present

## 2017-06-04 DIAGNOSIS — M05742 Rheumatoid arthritis with rheumatoid factor of left hand without organ or systems involvement: Secondary | ICD-10-CM | POA: Diagnosis not present

## 2017-06-04 DIAGNOSIS — R0603 Acute respiratory distress: Secondary | ICD-10-CM | POA: Diagnosis not present

## 2017-06-05 NOTE — Telephone Encounter (Signed)
Dr. Nadel please advise.  Thanks! 

## 2017-06-09 NOTE — Telephone Encounter (Signed)
SN, please advise. Thanks!

## 2017-06-09 NOTE — Telephone Encounter (Signed)
Per SN ok if Patient wants Inogen G3.

## 2017-06-10 ENCOUNTER — Other Ambulatory Visit: Payer: Self-pay | Admitting: Pulmonary Disease

## 2017-06-10 DIAGNOSIS — R0600 Dyspnea, unspecified: Secondary | ICD-10-CM

## 2017-06-10 DIAGNOSIS — R0609 Other forms of dyspnea: Principal | ICD-10-CM

## 2017-06-10 NOTE — Telephone Encounter (Signed)
Order placed to [facility]. Pt advised order was sent.  Pt states she has been coughing up mucus but it is clear in color. She started Levaquin Tuesday but she is wheezing and feels like she has a lot of congestion. She has taken her mucinex and she is using her nebulizer. She just wanted to let SN know to see if he had any recommendations. She is on prednisone daily already 5 mg already. SN please advise.   Current Outpatient Medications on File Prior to Visit  Medication Sig Dispense Refill  . acetaminophen (TYLENOL) 500 MG tablet Take 1,000 mg by mouth every 6 (six) hours as needed for moderate pain.    . Ascorbic Acid (VITAMIN C) 1000 MG tablet Take 1,000 mg by mouth daily.    Marland Kitchen aspirin 81 MG tablet Take 81 mg by mouth every other day.    . budesonide-formoterol (SYMBICORT) 160-4.5 MCG/ACT inhaler Inhale 2 puffs into the lungs 2 (two) times daily. 2 Inhaler 0  . Calcium Carbonate-Vitamin D (CALCIUM + D) 600-200 MG-UNIT TABS Take 1 tablet by mouth daily.      . citalopram (CELEXA) 40 MG tablet Take 40 mg by mouth daily.    Marland Kitchen guaiFENesin (MUCINEX) 600 MG 12 hr tablet Take 600 mg by mouth daily.     . hydroxychloroquine (PLAQUENIL) 200 MG tablet Take 200 mg by mouth 2 (two) times daily.    . Leflunomide (ARAVA PO) Take by mouth daily.    Marland Kitchen levalbuterol (XOPENEX) 1.25 MG/3ML nebulizer solution Take 1.25 mg by nebulization 3 (three) times daily. (Patient taking differently: Take 1.25 mg by nebulization 3 (three) times daily as needed. ) 270 mL prn  . lisinopril (PRINIVIL,ZESTRIL) 5 MG tablet Take 5 mg by mouth daily.   6  . loratadine (CLARITIN) 10 MG tablet Take 10 mg by mouth daily.      . Omega-3 Fatty Acids (FISH OIL) 1200 MG CAPS Take 1 capsule by mouth daily.      Marland Kitchen omeprazole (PRILOSEC) 40 MG capsule Take 1 capsule by mouth 2 (two) times daily.    . OXYGEN Inhale into the lungs. 2 lpm with rest and exertion    . pramipexole (MIRAPEX) 0.25 MG tablet Take 0.25 mg by mouth daily as needed  (restless leg).    . predniSONE (DELTASONE) 5 MG tablet Take 10 mg by mouth daily with breakfast.     . Tiotropium Bromide Monohydrate (SPIRIVA RESPIMAT) 1.25 MCG/ACT AERS Inhale 2 puffs into the lungs daily. 2 Inhaler 0  . traMADol (ULTRAM) 50 MG tablet Take 1/2 to 1 tablet TID with Extra Strength Tylenol 90 tablet 0  . valACYclovir (VALTREX) 1000 MG tablet Take 1,000 mg by mouth daily as needed (for breakouts).      No current facility-administered medications on file prior to visit.    Current Outpatient Medications on File Prior to Visit  Medication Sig Dispense Refill  . acetaminophen (TYLENOL) 500 MG tablet Take 1,000 mg by mouth every 6 (six) hours as needed for moderate pain.    . Ascorbic Acid (VITAMIN C) 1000 MG tablet Take 1,000 mg by mouth daily.    Marland Kitchen aspirin 81 MG tablet Take 81 mg by mouth every other day.    . budesonide-formoterol (SYMBICORT) 160-4.5 MCG/ACT inhaler Inhale 2 puffs into the lungs 2 (two) times daily. 2 Inhaler 0  . Calcium Carbonate-Vitamin D (CALCIUM + D) 600-200 MG-UNIT TABS Take 1 tablet by mouth daily.      . citalopram (CELEXA) 40  MG tablet Take 40 mg by mouth daily.    Marland Kitchen guaiFENesin (MUCINEX) 600 MG 12 hr tablet Take 600 mg by mouth daily.     . hydroxychloroquine (PLAQUENIL) 200 MG tablet Take 200 mg by mouth 2 (two) times daily.    . Leflunomide (ARAVA PO) Take by mouth daily.    Marland Kitchen levalbuterol (XOPENEX) 1.25 MG/3ML nebulizer solution Take 1.25 mg by nebulization 3 (three) times daily. (Patient taking differently: Take 1.25 mg by nebulization 3 (three) times daily as needed. ) 270 mL prn  . lisinopril (PRINIVIL,ZESTRIL) 5 MG tablet Take 5 mg by mouth daily.   6  . loratadine (CLARITIN) 10 MG tablet Take 10 mg by mouth daily.      . Omega-3 Fatty Acids (FISH OIL) 1200 MG CAPS Take 1 capsule by mouth daily.      Marland Kitchen omeprazole (PRILOSEC) 40 MG capsule Take 1 capsule by mouth 2 (two) times daily.    . OXYGEN Inhale into the lungs. 2 lpm with rest and  exertion    . pramipexole (MIRAPEX) 0.25 MG tablet Take 0.25 mg by mouth daily as needed (restless leg).    . predniSONE (DELTASONE) 5 MG tablet Take 10 mg by mouth daily with breakfast.     . Tiotropium Bromide Monohydrate (SPIRIVA RESPIMAT) 1.25 MCG/ACT AERS Inhale 2 puffs into the lungs daily. 2 Inhaler 0  . traMADol (ULTRAM) 50 MG tablet Take 1/2 to 1 tablet TID with Extra Strength Tylenol 90 tablet 0  . valACYclovir (VALTREX) 1000 MG tablet Take 1,000 mg by mouth daily as needed (for breakouts).      No current facility-administered medications on file prior to visit.

## 2017-06-10 NOTE — Telephone Encounter (Signed)
Spoke with Dr. Lenna Gilford. He recommended the following regimen:   Morning:  *Use Xopenex neb solution * 2 puffs of Symbicort 160 *Take 600mg  of Mucinex  Mid-Day: *Use Xopenex neb solution *2 puffs of Spiriva *Take 600mg  of Mucinex  Evening: *Use Xopenex neb solution *2 puffs of Symbicort 160 *Take 600mg  of Mucinex  Advised patient of SN's recs. She verbalized understanding. Nothing else needed at time of call.

## 2017-06-12 ENCOUNTER — Telehealth: Payer: Self-pay | Admitting: Pulmonary Disease

## 2017-06-12 DIAGNOSIS — M05742 Rheumatoid arthritis with rheumatoid factor of left hand without organ or systems involvement: Secondary | ICD-10-CM | POA: Diagnosis not present

## 2017-06-12 DIAGNOSIS — M05741 Rheumatoid arthritis with rheumatoid factor of right hand without organ or systems involvement: Secondary | ICD-10-CM | POA: Diagnosis not present

## 2017-06-12 DIAGNOSIS — G4733 Obstructive sleep apnea (adult) (pediatric): Secondary | ICD-10-CM | POA: Diagnosis not present

## 2017-06-12 DIAGNOSIS — J849 Interstitial pulmonary disease, unspecified: Secondary | ICD-10-CM | POA: Diagnosis not present

## 2017-06-12 DIAGNOSIS — R0603 Acute respiratory distress: Secondary | ICD-10-CM | POA: Diagnosis not present

## 2017-06-12 DIAGNOSIS — J439 Emphysema, unspecified: Secondary | ICD-10-CM | POA: Diagnosis not present

## 2017-06-12 DIAGNOSIS — J449 Chronic obstructive pulmonary disease, unspecified: Secondary | ICD-10-CM | POA: Diagnosis not present

## 2017-06-12 NOTE — Telephone Encounter (Signed)
Spoke with patient. She stated that she has decided to stay with [facility] instead of switching to [facility]. She was not pleased with [employer]'s customer service. She will keep her Simply Go Mini.   Nothing else needed at time of call.

## 2017-06-17 ENCOUNTER — Telehealth: Payer: Self-pay | Admitting: Pulmonary Disease

## 2017-06-17 NOTE — Telephone Encounter (Signed)
Per 06/12/17 phone note- pt decided to stay with [facility] instead of switching to [facility]. I have spoken with pt, who verified that she does not wish to switch to [facility].  I have spoken to [facility] and asked that they cancel the order per pt request. Nothing further is needed.

## 2017-06-30 ENCOUNTER — Other Ambulatory Visit: Payer: Self-pay | Admitting: Pulmonary Disease

## 2017-07-12 DIAGNOSIS — J849 Interstitial pulmonary disease, unspecified: Secondary | ICD-10-CM | POA: Diagnosis not present

## 2017-07-12 DIAGNOSIS — J449 Chronic obstructive pulmonary disease, unspecified: Secondary | ICD-10-CM | POA: Diagnosis not present

## 2017-07-12 DIAGNOSIS — G4733 Obstructive sleep apnea (adult) (pediatric): Secondary | ICD-10-CM | POA: Diagnosis not present

## 2017-07-12 DIAGNOSIS — M05742 Rheumatoid arthritis with rheumatoid factor of left hand without organ or systems involvement: Secondary | ICD-10-CM | POA: Diagnosis not present

## 2017-07-12 DIAGNOSIS — M05741 Rheumatoid arthritis with rheumatoid factor of right hand without organ or systems involvement: Secondary | ICD-10-CM | POA: Diagnosis not present

## 2017-07-12 DIAGNOSIS — J439 Emphysema, unspecified: Secondary | ICD-10-CM | POA: Diagnosis not present

## 2017-07-12 DIAGNOSIS — R0603 Acute respiratory distress: Secondary | ICD-10-CM | POA: Diagnosis not present

## 2017-07-15 DIAGNOSIS — Z79899 Other long term (current) drug therapy: Secondary | ICD-10-CM | POA: Diagnosis not present

## 2017-07-15 DIAGNOSIS — J849 Interstitial pulmonary disease, unspecified: Secondary | ICD-10-CM | POA: Diagnosis not present

## 2017-07-15 DIAGNOSIS — M0579 Rheumatoid arthritis with rheumatoid factor of multiple sites without organ or systems involvement: Secondary | ICD-10-CM | POA: Diagnosis not present

## 2017-07-15 DIAGNOSIS — M2550 Pain in unspecified joint: Secondary | ICD-10-CM | POA: Diagnosis not present

## 2017-07-15 DIAGNOSIS — Z6841 Body Mass Index (BMI) 40.0 and over, adult: Secondary | ICD-10-CM | POA: Diagnosis not present

## 2017-07-29 DIAGNOSIS — Z79899 Other long term (current) drug therapy: Secondary | ICD-10-CM | POA: Diagnosis not present

## 2017-07-29 DIAGNOSIS — H524 Presbyopia: Secondary | ICD-10-CM | POA: Diagnosis not present

## 2017-07-29 DIAGNOSIS — H5203 Hypermetropia, bilateral: Secondary | ICD-10-CM | POA: Diagnosis not present

## 2017-07-29 DIAGNOSIS — H2513 Age-related nuclear cataract, bilateral: Secondary | ICD-10-CM | POA: Diagnosis not present

## 2017-07-29 DIAGNOSIS — M0609 Rheumatoid arthritis without rheumatoid factor, multiple sites: Secondary | ICD-10-CM | POA: Diagnosis not present

## 2017-07-29 DIAGNOSIS — H25013 Cortical age-related cataract, bilateral: Secondary | ICD-10-CM | POA: Diagnosis not present

## 2017-07-30 ENCOUNTER — Telehealth: Payer: Self-pay | Admitting: Pulmonary Disease

## 2017-07-30 NOTE — Telephone Encounter (Signed)
SN is aware. 

## 2017-07-30 NOTE — Telephone Encounter (Signed)
I called pt & we discussed her upcoming cataract surg...  SMN

## 2017-07-30 NOTE — Telephone Encounter (Signed)
Spoke with pt. States that she is wanting to speak to Dr. Lenna Gilford. Pt has upcoming surgeries scheduled for cataract removal. Pt's son is very nervous about her having this done and wants to talk to Dr. Lenna Gilford about the surgeries.  Dr. Lenna Gilford - please advise. Thanks.

## 2017-08-06 DIAGNOSIS — H2513 Age-related nuclear cataract, bilateral: Secondary | ICD-10-CM | POA: Diagnosis not present

## 2017-08-06 DIAGNOSIS — H25013 Cortical age-related cataract, bilateral: Secondary | ICD-10-CM | POA: Diagnosis not present

## 2017-08-12 DIAGNOSIS — R0603 Acute respiratory distress: Secondary | ICD-10-CM | POA: Diagnosis not present

## 2017-08-12 DIAGNOSIS — J849 Interstitial pulmonary disease, unspecified: Secondary | ICD-10-CM | POA: Diagnosis not present

## 2017-08-12 DIAGNOSIS — M05741 Rheumatoid arthritis with rheumatoid factor of right hand without organ or systems involvement: Secondary | ICD-10-CM | POA: Diagnosis not present

## 2017-08-12 DIAGNOSIS — G4733 Obstructive sleep apnea (adult) (pediatric): Secondary | ICD-10-CM | POA: Diagnosis not present

## 2017-08-12 DIAGNOSIS — J439 Emphysema, unspecified: Secondary | ICD-10-CM | POA: Diagnosis not present

## 2017-08-12 DIAGNOSIS — M05742 Rheumatoid arthritis with rheumatoid factor of left hand without organ or systems involvement: Secondary | ICD-10-CM | POA: Diagnosis not present

## 2017-08-12 DIAGNOSIS — J449 Chronic obstructive pulmonary disease, unspecified: Secondary | ICD-10-CM | POA: Diagnosis not present

## 2017-08-15 DIAGNOSIS — Z79899 Other long term (current) drug therapy: Secondary | ICD-10-CM | POA: Diagnosis not present

## 2017-08-15 DIAGNOSIS — Z6841 Body Mass Index (BMI) 40.0 and over, adult: Secondary | ICD-10-CM | POA: Diagnosis not present

## 2017-08-15 DIAGNOSIS — G2581 Restless legs syndrome: Secondary | ICD-10-CM | POA: Diagnosis not present

## 2017-08-15 DIAGNOSIS — I10 Essential (primary) hypertension: Secondary | ICD-10-CM | POA: Diagnosis not present

## 2017-08-15 DIAGNOSIS — M05741 Rheumatoid arthritis with rheumatoid factor of right hand without organ or systems involvement: Secondary | ICD-10-CM | POA: Diagnosis not present

## 2017-08-15 DIAGNOSIS — G4733 Obstructive sleep apnea (adult) (pediatric): Secondary | ICD-10-CM | POA: Diagnosis not present

## 2017-08-15 DIAGNOSIS — J449 Chronic obstructive pulmonary disease, unspecified: Secondary | ICD-10-CM | POA: Diagnosis not present

## 2017-08-15 DIAGNOSIS — J9611 Chronic respiratory failure with hypoxia: Secondary | ICD-10-CM | POA: Diagnosis not present

## 2017-08-15 DIAGNOSIS — Z1389 Encounter for screening for other disorder: Secondary | ICD-10-CM | POA: Diagnosis not present

## 2017-08-15 DIAGNOSIS — E782 Mixed hyperlipidemia: Secondary | ICD-10-CM | POA: Diagnosis not present

## 2017-08-15 DIAGNOSIS — Z0000 Encounter for general adult medical examination without abnormal findings: Secondary | ICD-10-CM | POA: Diagnosis not present

## 2017-08-27 DIAGNOSIS — H25011 Cortical age-related cataract, right eye: Secondary | ICD-10-CM | POA: Diagnosis not present

## 2017-08-27 DIAGNOSIS — H25012 Cortical age-related cataract, left eye: Secondary | ICD-10-CM | POA: Diagnosis not present

## 2017-08-27 DIAGNOSIS — H2511 Age-related nuclear cataract, right eye: Secondary | ICD-10-CM | POA: Diagnosis not present

## 2017-08-27 DIAGNOSIS — H2512 Age-related nuclear cataract, left eye: Secondary | ICD-10-CM | POA: Diagnosis not present

## 2017-09-10 DIAGNOSIS — H2512 Age-related nuclear cataract, left eye: Secondary | ICD-10-CM | POA: Diagnosis not present

## 2017-09-10 DIAGNOSIS — H25012 Cortical age-related cataract, left eye: Secondary | ICD-10-CM | POA: Diagnosis not present

## 2017-09-11 DIAGNOSIS — R0603 Acute respiratory distress: Secondary | ICD-10-CM | POA: Diagnosis not present

## 2017-09-11 DIAGNOSIS — J449 Chronic obstructive pulmonary disease, unspecified: Secondary | ICD-10-CM | POA: Diagnosis not present

## 2017-09-11 DIAGNOSIS — J439 Emphysema, unspecified: Secondary | ICD-10-CM | POA: Diagnosis not present

## 2017-09-11 DIAGNOSIS — J849 Interstitial pulmonary disease, unspecified: Secondary | ICD-10-CM | POA: Diagnosis not present

## 2017-09-11 DIAGNOSIS — G4733 Obstructive sleep apnea (adult) (pediatric): Secondary | ICD-10-CM | POA: Diagnosis not present

## 2017-09-11 DIAGNOSIS — M05742 Rheumatoid arthritis with rheumatoid factor of left hand without organ or systems involvement: Secondary | ICD-10-CM | POA: Diagnosis not present

## 2017-09-11 DIAGNOSIS — M05741 Rheumatoid arthritis with rheumatoid factor of right hand without organ or systems involvement: Secondary | ICD-10-CM | POA: Diagnosis not present

## 2017-10-12 DIAGNOSIS — G4733 Obstructive sleep apnea (adult) (pediatric): Secondary | ICD-10-CM | POA: Diagnosis not present

## 2017-10-12 DIAGNOSIS — M05742 Rheumatoid arthritis with rheumatoid factor of left hand without organ or systems involvement: Secondary | ICD-10-CM | POA: Diagnosis not present

## 2017-10-12 DIAGNOSIS — M05741 Rheumatoid arthritis with rheumatoid factor of right hand without organ or systems involvement: Secondary | ICD-10-CM | POA: Diagnosis not present

## 2017-10-12 DIAGNOSIS — J439 Emphysema, unspecified: Secondary | ICD-10-CM | POA: Diagnosis not present

## 2017-10-12 DIAGNOSIS — R0603 Acute respiratory distress: Secondary | ICD-10-CM | POA: Diagnosis not present

## 2017-10-12 DIAGNOSIS — J849 Interstitial pulmonary disease, unspecified: Secondary | ICD-10-CM | POA: Diagnosis not present

## 2017-10-12 DIAGNOSIS — J449 Chronic obstructive pulmonary disease, unspecified: Secondary | ICD-10-CM | POA: Diagnosis not present

## 2017-10-14 DIAGNOSIS — M2550 Pain in unspecified joint: Secondary | ICD-10-CM | POA: Diagnosis not present

## 2017-10-14 DIAGNOSIS — F5101 Primary insomnia: Secondary | ICD-10-CM | POA: Diagnosis not present

## 2017-10-14 DIAGNOSIS — M170 Bilateral primary osteoarthritis of knee: Secondary | ICD-10-CM | POA: Diagnosis not present

## 2017-10-14 DIAGNOSIS — Z6841 Body Mass Index (BMI) 40.0 and over, adult: Secondary | ICD-10-CM | POA: Diagnosis not present

## 2017-10-14 DIAGNOSIS — J849 Interstitial pulmonary disease, unspecified: Secondary | ICD-10-CM | POA: Diagnosis not present

## 2017-10-14 DIAGNOSIS — Z79899 Other long term (current) drug therapy: Secondary | ICD-10-CM | POA: Diagnosis not present

## 2017-10-14 DIAGNOSIS — M0579 Rheumatoid arthritis with rheumatoid factor of multiple sites without organ or systems involvement: Secondary | ICD-10-CM | POA: Diagnosis not present

## 2017-11-11 DIAGNOSIS — R0603 Acute respiratory distress: Secondary | ICD-10-CM | POA: Diagnosis not present

## 2017-11-11 DIAGNOSIS — G4733 Obstructive sleep apnea (adult) (pediatric): Secondary | ICD-10-CM | POA: Diagnosis not present

## 2017-11-11 DIAGNOSIS — J449 Chronic obstructive pulmonary disease, unspecified: Secondary | ICD-10-CM | POA: Diagnosis not present

## 2017-11-11 DIAGNOSIS — M05741 Rheumatoid arthritis with rheumatoid factor of right hand without organ or systems involvement: Secondary | ICD-10-CM | POA: Diagnosis not present

## 2017-11-11 DIAGNOSIS — J439 Emphysema, unspecified: Secondary | ICD-10-CM | POA: Diagnosis not present

## 2017-11-11 DIAGNOSIS — M05742 Rheumatoid arthritis with rheumatoid factor of left hand without organ or systems involvement: Secondary | ICD-10-CM | POA: Diagnosis not present

## 2017-11-11 DIAGNOSIS — J849 Interstitial pulmonary disease, unspecified: Secondary | ICD-10-CM | POA: Diagnosis not present

## 2017-11-20 ENCOUNTER — Encounter: Payer: Self-pay | Admitting: Nurse Practitioner

## 2017-11-20 ENCOUNTER — Ambulatory Visit: Payer: PPO | Admitting: Nurse Practitioner

## 2017-11-20 ENCOUNTER — Telehealth: Payer: Self-pay | Admitting: General Surgery

## 2017-11-20 VITALS — BP 128/82 | HR 112 | Ht 67.0 in | Wt 275.2 lb

## 2017-11-20 DIAGNOSIS — R0609 Other forms of dyspnea: Secondary | ICD-10-CM

## 2017-11-20 DIAGNOSIS — J849 Interstitial pulmonary disease, unspecified: Secondary | ICD-10-CM

## 2017-11-20 DIAGNOSIS — J449 Chronic obstructive pulmonary disease, unspecified: Secondary | ICD-10-CM | POA: Diagnosis not present

## 2017-11-20 DIAGNOSIS — R0600 Dyspnea, unspecified: Secondary | ICD-10-CM

## 2017-11-20 MED ORDER — LORAZEPAM 0.5 MG PO TABS: 0.5000 mg | ORAL_TABLET | Freq: Two times a day (BID) | ORAL | 0 refills | Status: DC

## 2017-11-20 MED ORDER — LEVALBUTEROL HCL 0.63 MG/3ML IN NEBU
0.6300 mg | INHALATION_SOLUTION | Freq: Once | RESPIRATORY_TRACT | Status: AC
  Administered 2017-11-20: 0.63 mg via RESPIRATORY_TRACT

## 2017-11-20 MED ORDER — BUDESONIDE-FORMOTEROL FUMARATE 160-4.5 MCG/ACT IN AERO: 2.0000 | INHALATION_SPRAY | Freq: Two times a day (BID) | RESPIRATORY_TRACT | 0 refills | Status: DC

## 2017-11-20 NOTE — Patient Instructions (Addendum)
Will start on low dose  ativan BID Take nebulizer treatments as needed Continue current medications Please follow up with Dr. Maxwell Caul for CPAP Follow up with Dr. Lenna Gilford on Monday

## 2017-11-20 NOTE — Progress Notes (Signed)
@Patient  ID: [name], female    DOB: 1941-01-23, 77 y.o.   MRN: [id number]  Chief Complaint  Patient presents with  . Follow-up    Feels smothered at night, as if she could not catch her breath.    Referring provider: Lajean Manes, MD   77 year old female Followed by Dr. Lenna Gilford whose PCP is Dr. Felipa Eth and followed by Rheum-Dr. Trudie Reed w/ seropos RA, underlying ILD, and superimposed mixed COPD (emphysema and chr bronchitis) + exercise induced hypoxemia, & DJD; on Arava & low dose Pred plus formal PT, Xopenex NEBS Tid, Symbicort160-2spBid, Spiriva daily, & started on Home O2 (11/16) at 2L/min w/ exercise  HPI  Patient is being seen today for shortness of breath. She states that she woke up this morning at 4:00 a.m. She states that she felt like she was smothering and states that she has been short of breath all day. States that she feels like she can not take a deep breath in. Admits that she did not take any of her prn nebulizer treatments. She did take her regularly scheduled medications as directed. Denies any fever or chest pain.   Recent [facility] Encounters:   Plan from OV 05-26-17: Today we updated your med list in our EPIC system...    Continue your current medications the same...    We added the Plaquenil & Mirapex meds from [facility]...  Continue the Symbicort & Spiriva as you are doing... Don't be afraid to use the NEBULIZER if necessary   Continue your Oxygen at 1-2L/min as we discussed...  Call for any questions...  Let's plan a follow up visit in 63mo, sooner if needed for problems...    Tests:    CXR 11/12/16 (independently reviewed by me in the PACS system) showed heart at upper lim of norm, hyperinflation c/w COPD, chr interstitial prominence (stable)- NAD.Marland KitchenMarland Kitchen   EKG 11/12/16 shows NSR, rate75, wnl...  Ambulatory Oximetry 11/12/16 on 1L/min showed O2sat=94% at rest w/ pulse=85/min;  She ambulated only 2 Laps (185'ea) on 1L/min w/  lowest O2sat=89% w/ heart rate= 95/min;  She could not go further due to leg fatigue...       Allergies  Allergen Reactions  . Shellfish Allergy Anaphylaxis    With vomiting and diarrhea  . Macrolides And Ketolides   . Penicillins     Facial numbness  . Sertraline Hcl   . Sulfa Antibiotics     Facial numbness    Immunization History  Administered Date(s) Administered  . Influenza, High Dose Seasonal PF 01/17/2016, 01/14/2017  . Influenza-Unspecified 12/30/2014  . Pneumococcal Conjugate-13 07/07/2013  . Pneumococcal Polysaccharide-23 03/03/2009  . Zoster Recombinat (Shingrix) 12/14/2016    Past Medical History:  Diagnosis Date  . Allergic rhinitis   . Asthma   . Asthmatic bronchitis   . Colon polyps   . COPD (chronic obstructive pulmonary disease) ([place])   . Diverticulosis   . GERD (gastroesophageal reflux disease)   . H/O: GI bleed   . Hypercholesterolemia   . Migraine headache   . Obesity   . RLS (restless legs syndrome)     Tobacco History: Social History   Tobacco Use  Smoking Status Former Smoker  . Packs/day: 1.00  . Years: 45.00  . Pack years: 45.00  . Types: Cigarettes  . Last attempt to quit: 04/15/2001  . Years since quitting: 16.6  Smokeless Tobacco Never Used   Counseling given: Former smoker   Outpatient Encounter Medications as of 11/20/2017  Medication Sig  . acetaminophen (TYLENOL) 500 MG tablet Take 1,000 mg by mouth every 6 (six) hours as needed for moderate pain.  . Ascorbic Acid (VITAMIN C) 1000 MG tablet Take 1,000 mg by mouth daily.  Marland Kitchen aspirin 81 MG tablet Take 81 mg by mouth every other day.  . budesonide-formoterol (SYMBICORT) 160-4.5 MCG/ACT inhaler Inhale 2 puffs into the lungs 2 (two) times daily.  . Calcium Carbonate-Vitamin D (CALCIUM + D) 600-200 MG-UNIT TABS Take 1 tablet by mouth daily.    Marland Kitchen guaiFENesin (MUCINEX) 600 MG 12 hr tablet Take 600 mg by mouth daily.   . hydroxychloroquine (PLAQUENIL) 200 MG tablet Take 200 mg by  mouth 2 (two) times daily.  . Leflunomide (ARAVA PO) Take by mouth daily.  Marland Kitchen levalbuterol (XOPENEX) 1.25 MG/3ML nebulizer solution Take 1.25 mg by nebulization 3 (three) times daily. (Patient taking differently: Take 1.25 mg by nebulization 3 (three) times daily as needed. )  . lisinopril (PRINIVIL,ZESTRIL) 5 MG tablet Take 5 mg by mouth daily.   Marland Kitchen loratadine (CLARITIN) 10 MG tablet Take 10 mg by mouth daily.    . Omega-3 Fatty Acids (FISH OIL) 1200 MG CAPS Take 1 capsule by mouth daily.    Marland Kitchen omeprazole (PRILOSEC) 40 MG capsule Take 1 capsule by mouth 2 (two) times daily.  . OXYGEN Inhale into the lungs. 2 lpm with rest and exertion  . pramipexole (MIRAPEX) 0.5 MG tablet   . predniSONE (DELTASONE) 5 MG tablet Take 10 mg by mouth daily with breakfast.   . traMADol (ULTRAM) 50 MG tablet Take 1/2 to 1 tablet TID with Extra Strength Tylenol  . valACYclovir (VALTREX) 1000 MG tablet Take 1,000 mg by mouth daily as needed (for breakouts).   Marland Kitchen alendronate (FOSAMAX) 70 MG tablet   . budesonide-formoterol (SYMBICORT) 160-4.5 MCG/ACT inhaler Inhale 2 puffs into the lungs 2 (two) times daily.  Marland Kitchen LORazepam (ATIVAN) 0.5 MG tablet Take 1 tablet (0.5 mg total) by mouth 2 (two) times daily.  . Tiotropium Bromide Monohydrate (SPIRIVA RESPIMAT) 1.25 MCG/ACT AERS Inhale 2 puffs into the lungs daily.  . [DISCONTINUED] citalopram (CELEXA) 40 MG tablet Take 40 mg by mouth daily.  . [DISCONTINUED] pramipexole (MIRAPEX) 0.25 MG tablet Take 0.25 mg by mouth daily as needed (restless leg).  . [EXPIRED] levalbuterol (XOPENEX) nebulizer solution 0.63 mg    No facility-administered encounter medications on file as of 11/20/2017.      Review of Systems  Review of Systems  Constitutional: Negative.   HENT: Negative.   Respiratory: Positive for shortness of breath. Negative for cough.   Cardiovascular: Negative.   Gastrointestinal: Negative.   Skin: Negative.   Allergic/Immunologic: Negative.   Neurological:  Negative.   Psychiatric/Behavioral: Negative.        Very anxious today       Physical Exam  BP 128/82 (BP Location: Left Arm, Patient Position: Sitting, Cuff Size: Large)   Pulse (!) 112   Ht 5\' 7"  (1.702 m)   Wt 275 lb 3.2 oz (124.8 kg)   SpO2 97% Comment: On pulse oxygen - level 2  BMI 43.10 kg/m   Wt Readings from Last 5 Encounters:  11/20/17 275 lb 3.2 oz (124.8 kg)  05/26/17 282 lb 3.2 oz (128 kg)  01/21/17 268 lb (121.6 kg)  11/12/16 267 lb 6 oz (121.3 kg)  07/22/16 276 lb (125.2 kg)     Physical Exam  Constitutional: She is oriented to person, place, and time. She appears well-developed and well-nourished. No distress.  Cardiovascular: Normal rate and regular rhythm.  Pulmonary/Chest: Effort normal and breath sounds normal.  Neurological: She is alert and oriented to person, place, and time.  Psychiatric: She has a normal mood and affect.  Nursing note and vitals reviewed.    Lab Results:  CBC    Component Value Date/Time   WBC 14.7 (H) 03/13/2015 1107   RBC 4.12 03/13/2015 1107   HGB 12.6 03/13/2015 1107   HCT 38.7 03/13/2015 1107   PLT 341 03/13/2015 1107   MCV 93.9 03/13/2015 1107   MCH 30.6 03/13/2015 1107   MCHC 32.6 03/13/2015 1107   RDW 16.0 (H) 03/13/2015 1107   LYMPHSABS 3.2 02/06/2015 1129   MONOABS 1.7 (H) 02/06/2015 1129   EOSABS 0.3 02/06/2015 1129   BASOSABS 0.1 02/06/2015 1129    BMET    Component Value Date/Time   NA 141 03/13/2015 1107   K 4.2 03/13/2015 1107   CL 105 03/13/2015 1107   CO2 24 03/13/2015 1107   GLUCOSE 146 (H) 03/13/2015 1107   BUN 12 03/13/2015 1107   CREATININE 0.71 03/13/2015 1107   CALCIUM 9.4 03/13/2015 1107   GFRNONAA >60 03/13/2015 1107   GFRAA >60 03/13/2015 1107      Assessment & Plan:   COPD mixed type [facility]) Patient Instructions  Patient is very anxious today Will start on low dose  ativan BID Take nebulizer treatments as needed Continue current medications Please follow up with Dr.  Felipa Eth for CPAP Follow up with Dr. Lenna Gilford in 3 weeks       Fenton Foy, NP 11/21/2017

## 2017-11-20 NOTE — Assessment & Plan Note (Signed)
Patient Instructions  Patient is very anxious today Will start on low dose  ativan BID Take nebulizer treatments as needed Continue current medications Please follow up with Dr. Felipa Eth for CPAP Follow up with Dr. Lenna Gilford in 3 weeks

## 2017-11-21 NOTE — Telephone Encounter (Signed)
ATC pt, no answer. Left message for pt to call back.  

## 2017-11-21 NOTE — Telephone Encounter (Signed)
LMOM TCB x1 for [place] w/ Dr Felipa Eth to see if they manage pt's CPAP (office note says [facility] but [facility] says Dr. Maxwell Caul with whom Hinton Dyer left msg for Lauren @ [facility]).  If they do not manage pt's CPAP, can they tell us who does?  LMOM TCB x1 with Lauren to inform her that we've reached out to Dr Carlyle Lipa office  Routing to [place] for follow up

## 2017-11-21 NOTE — Telephone Encounter (Signed)
[place] returned call - she confirmed that Dr. Felipa Eth does manage pt's cpap. Advised that patient is in need of new one - she said that she will start working on that order for the new cpap machine. She can be reached at [phone]

## 2017-11-21 NOTE — Telephone Encounter (Signed)
Lauren at [place] called back states that patient has never been seen by a provider there. Patient has seen Dr. Felipa Eth who may be willing to sign order for new cpap - Lauren can be reached at [phone] -pr

## 2017-11-21 NOTE — Telephone Encounter (Signed)
Routing to [place] for follow up.

## 2017-11-21 NOTE — Telephone Encounter (Signed)
[name] is returning call. Cb is [phone]

## 2017-11-24 ENCOUNTER — Ambulatory Visit: Payer: PPO | Admitting: Pulmonary Disease

## 2017-11-24 ENCOUNTER — Encounter: Payer: Self-pay | Admitting: Pulmonary Disease

## 2017-11-24 ENCOUNTER — Ambulatory Visit (INDEPENDENT_AMBULATORY_CARE_PROVIDER_SITE_OTHER)
Admission: RE | Admit: 2017-11-24 | Discharge: 2017-11-24 | Disposition: A | Discharge status: Home / Self Care | Payer: PPO | Source: Ambulatory Visit | Attending: Pulmonary Disease | Admitting: Pulmonary Disease

## 2017-11-24 VITALS — BP 136/82 | HR 84 | Temp 98.0°F | Ht 67.0 in | Wt 272.6 lb

## 2017-11-24 DIAGNOSIS — R0609 Other forms of dyspnea: Secondary | ICD-10-CM

## 2017-11-24 DIAGNOSIS — M069 Rheumatoid arthritis, unspecified: Secondary | ICD-10-CM | POA: Diagnosis not present

## 2017-11-24 DIAGNOSIS — J449 Chronic obstructive pulmonary disease, unspecified: Secondary | ICD-10-CM

## 2017-11-24 DIAGNOSIS — J849 Interstitial pulmonary disease, unspecified: Secondary | ICD-10-CM

## 2017-11-24 DIAGNOSIS — R0600 Dyspnea, unspecified: Secondary | ICD-10-CM

## 2017-11-24 DIAGNOSIS — R0602 Shortness of breath: Secondary | ICD-10-CM | POA: Diagnosis not present

## 2017-11-24 MED ORDER — LORAZEPAM 0.5 MG PO TABS: 0.5000 mg | ORAL_TABLET | Freq: Two times a day (BID) | ORAL | 5 refills | Status: AC

## 2017-11-24 NOTE — Patient Instructions (Addendum)
Today we updated your med list in our EPIC system...    Continue your current medications the same...  Continue the LORAZEPAM 0.5mg  tabs taking 1/2 to 1 tab as we discussed (lim= 2 tabs per day)...  Today we did a follow up CXR...    We will contact you w/ the results when available...   Call for any questions or if we can be of service in any way...  Let's plan a follow up visit in 46mo, sooner if needed for problems.Marland KitchenMarland Kitchen

## 2017-11-24 NOTE — Telephone Encounter (Signed)
Please see the information below pertaining to the patient's CPAP. The order will be handled by patient PCP.

## 2017-11-25 ENCOUNTER — Encounter: Payer: Self-pay | Admitting: Pulmonary Disease

## 2017-11-25 NOTE — Progress Notes (Addendum)
Subjective:     Patient ID: Albin Fischer, female   DOB: 1940/12/19, 77 y.o.   MRN: [id number]  HPI    77 y/o WF whose PCP is DrStoneking and followed by Rheum-DrHawkes w/ seropos RA, underlying ILD, and superimposed mixed COPD (emphysema and chr bronchitis) + exercise induced hypoxemia, & DJD; on Arava & low dose Pred plus formal PT, Xopenex NEBS Tid, Symbicort160-2spBid, Spiriva daily, & started on Home O2 (11/16) at 2L/min w/ exercise...  ~  SEE PREV EPIC NOTES FOR OLDER DATA>  ~  March 29, 2011:  Initial pulmonary consult w/ KC>        The patient is a 77year-old female who I've been asked to see for ongoing pulmonary issues. She has been given the diagnosis of COPD years ago, but also tells me that she was diagnosed with asthma in her late 73s and put on a nebulizer. The patient thinks that her breathing is worsening, and she has had 3-4 episodes this year that have been labeled as "acute exacerbation" her last flareup was the end of November, and she was treated with antibiotics and prednisone. The patient notes a one block dyspnea on exertion at a moderate pace on flat ground. She will also get winded bringing groceries in from the car, and doing any kind of light housework. She notes very significant reflux symptoms, despite being on a proton pump inhibitor. She notes a barking cough at night and also first thing in the morning upon arising. The patient currently is on Symbicort, and has taken this compliantly. She has had no recent chest x-ray or full pulmonary function studies. Of note, her weight is neutral over the last one year.       PLAN>> I suspect, with the patient's long history of tobacco abuse, that she probably does have some degree of chronic airflow obstruction. It is unclear how much asthma may be playing a role here. She has had multiple episodes this year that required antibiotics and prednisone, and I wonder how much of this is related to her underlying lung issues or  possibly laryngopharyngeal reflux. She is having persistent reflux symptoms despite taking a proton pump inhibitor. She also describes cyclical coughing as well, which is typically an upper airway issue. I would like to check a chest x-ray today, and schedule her for full pulmonary function studies. I would also like to treat her reflux more aggressively over the next 3-4 weeks. We'll also add Spiriva to her Symbicort to see if this helps   CXR 03/29/11 showed borderline heart size, ectasia of thoracic Ao, low flat diaphragms, essentially clear lungs, osteopenia & degen spondylosis in spine...  FullPFTs 05/17/11 showed FVC=3.11 (99%), FEV1=1.67 (74%), %1sec=54, mid-flows reduced at 18% predicted; no improvement in FEV1 post bronchodil; TLC=4.52 (84%), RV=1.41 (65%), RV/TLC=31%; DLCO=50% predicted.=> c/w mod airflow obstruction, borderline lung volumes, moderate decr in diffusion capacity...    CT Chest 07/12/13 per DrStoneking showed norm heart size, atherosclerotic changes in Ao & coronaries, no adenopathy; biapical pleuroparenchymal scarring & emphysema, scattered subpleural nodules- 68mm LUL, 93mm RUL, 20mm RML, 74mm RLL...   CT Chest 02/02/14 per DrStoneking showed norm heart size, calcif atherosclerotic Ao & coronaries, no adenopathy, centrilob & paraseptal emphysema noted, mult small nonspecific pulm nodules- 47mm in LUL, 77mm in lingula, 5mm in RUL, 76mm in RML and no new or enlarging pulm lesions (stable pulm nodules)...  ~  February 06, 2015:  Initial pulm eval by SN>  Pt referred by DrStoneking for progressive  dyspnea and abnormal CT Chest... The patient's granddaughter is DrStoneking's nurse.      Pt presents very concerned about her pulmonary situation having recently had a 3rd CT Chest & was told that they detected more & bigger pulmonary nodules & she is scared that she might have cancer; she brought a disc w/ CT Chest done 01/26/15 at [facility] & on my review the scan looks different than  prev- now w/ widespread patchy ground glass opac superimposed on her prev emphysema changes & mult small pulm nodules (now largely obscured by this interstitial process); I am concerned this new ILD may represent COP (cryptogenic organizing pneumonia- the new terminology for our prev BOOP diagnosis)... She is c/o cough, small amt beige sput w/o hemoptysis, notes SOB/DOE w/ exertion (she used to swim a lot but recently has been more sedentary- esp since knee surg 10/2014); notes SOB w/ housework x61mo & stress makes the SOB worse, been using her Symbicort & Spiriva just "as needed" she says; DrStoneking got her a nebulizer w/ Xopenex to use TID but she has run out of this med...       In the course of our conversations Kiki tells me that she was prev diagnosed w/ Lupus by DrGates and DrLevitin years ago (1997) but we do not have any old data from this period (note from [place] 02/01/15 just mentioned lung nodules and PMHx has no mention of SLE, prev abn blood tests, prev Rheum eval, etc);  The pt volunteers that she had a pos ANA & was treated by DrLevitin w/ Pred intermittently over a 3 yr period;  ?why she stopped going & it was never mentioned again... She does admit to hx arthritis, hurting all over (esp knees), and she had arthroscopic knee surg by [facility] 10/26/14 "he scraped it out"; she had post op therapy  But she noted her breathing was worse...   Smoking Hx>  she is an ex-smoker, starting at age 3 & smoked for about 50 yrs up to 1.5ppd, & quit in 2003; est ~50-60 pack-yr smoking hx...  Pulmonary Hx>  She reports hx diphtheria as child, had lots of recurrent bronchitic infections in her adult life, she reports several bouts of pneumonia & wonders if these infections left her w/ scar tissue, seen by DrClance 2013 w/ remote hx asthma/ now modCOPD treated w/ Symbicort, Spriva, PPI & antireflux regimen;  ?she had sleep eval 2013 & was started on CPAP (?[facility], no records avail)...  Medical  Hx>  DrStoneking- HL, Obesity, GERD, Divertics, colon polyps, liver AVM, osteopenia, migraines, RLS, anxiety/depression...  Family Hx>  Hx asthma in her mother, otherw no FamHx lung problems...   Occup Hx>  No known occupational exposures- asbestos, chemical toxins, no recent travel or birds...  Current Meds>  Symbicort160, Spiriva, Xopenex neb, Mucinex, Claritin10, ASA81, Lisinopril5, Fish Oil, Prilosec20-2Bid, Celexa40, calcium/ vitamins... EXAM - Afeb, VSS, O2sat=92% on RA;  Obese at 251#, 5'7"Tall, BMI=39;  HEENT- neg, mallampati2;  Chest- decr BS bilat, few basilar rales, no wheezing or consolidation;  Heart- RR gr1-2 SEM w/o r/g;  Abd- obese soft neg;  Ext- w/o c/c/e  CT Chest 01/26/15 by [facility]- report by Dr. Anabel Halon- indicates extensive bilat pulm fibrosis, mult pulm masses & nodules- neoplasia cannot be excluded, mult nodes in mediastinum- mostly wnl in size... To my review this shows widespread patchy ground glass opac superimposed on her prev emphysema changes & mult small pulm nodules (now largely obscured by this interstitial process);  I am concerned this new ILD may represent COP.   CXR 02/06/15 showed norm heart size, lungs appear hyperinflated w/ coarsened interstitial markings bilat & an opacity at left lung base (all this new compared to last CXR 2012)...  Spirometry 02/06/15 showed FVC=1.97 (62%), FEV1=1.40 (60%), %1sec=71%, mid-flows are reduced at 49% predicted;  This is suggestive of mixed mild obstructive & mod restrictive dis...  Ambulatory oxygen saturation test 01/2015> O2sat=92% on RA at rest w/ pulse=95/min;  She ambulated in office only 1 lap w/ lowest O2sat=88% w/ pulse=124/min...  LABS 01/2015>  Chems- wnl;  CBC- wnl x WBC=19.5;  TSH=6.41;  Collagen-vasc screen:  ACE=10 (nl<52), RheumFactor=100 (Hi pos >42), Anti-CCP=213 (strong pos >59), ANA pos 1:320 homogeneous pattern, ANCA screen= NEG (MPO & PR-3), Sed=54, CRP=4.5.Marland KitchenMarland Kitchen IMP/PLAN >>       New ILD per  CXR & CT Chest- c/w COP/BOOP>  The totality of the eval including labs looks like this could be Rheumatoid lung dis; we will refer her to [facility] ASAP & start Pred rx...      Underlying COPD/emphysema, former smoker>  She is on Symbicort160-2spBid, Spiriva daily, Xopenex nebs Tid, Mucinex and rec to take meds regularly, NOT prn...      Hx mult small pulmonary nodules seen on prev CT scans in 2013>  This will need to be followed w/ serial CXR/ CT scans...      OSA- eval & Rx per [facility], no data avail in Epic>  Per DrStoneking & Maxwell Caul...      GERD/ prob LPR>  She needs a vigorous antireflux regimen w/ PPI Bid, NPO after dinner, elev HOB 6"; DrMJohnson is her GI...      Abn collagen-vasc screen w/ +RA, +Anti-CCP, +ANA, Sed54>  She was prev treated by DrLevitin for Lupus 1997-2000, labs look like RA & we will refer pt to [facility] for Rheum eval at this time...  ~  March 14, 2015:  4mo ROV w/ SN>        At the time of our initial visit we started PRED40 x2wks=> 20 x2wks w/ f/u appt 65mo;  She saw [facility] for Rheum in the interval but we don't have note from her to review;  Pt tells me that she has Hx OA w/ prev knee arthroscopy by DrCaffrey & presist w/ lots of knee pain, but she claims that while she was taking the Pred20 she suddenly developed severe pain & swelling in her hands, wrists and ankles=> called DrHawkes who wanted to get her off the Pred & weaned her down further to 10mg /d- recall that we started the Pred for prob Rheumatoid Lung w/ CT scan suggesting COP/BOOP pattern;  She further indicates to me that she fell out of bed 2wks ago- bruised leg & arm notes it's hard to walk w/ pain in feet & legs;  She called DStoneking's office & was switched from Aleve to Tylenol;  It got so bad she went to the ER yest=> given Tramadol & told to f/u here;  She has a follow up appt w/ [facility] tomorrow 03/15/15...      From the pulmonary standpoint she is about the same, but the pain in her hands/  wrist has been her main complaint for the last week or so;  She remains on Symbicort160-2spBid, Spiriva daily, NEBS w/ Xopenex tid, Mucinex prn;  She has had trouble doing the inhalers since her hands &wrist swelled up, pain w/ actuation;  She is SOB & O2sat=90% on RA today in office;  Notes sl dry cough, no sput, no hemoptysis, no CP...       EXAM shows Afeb, VSS, O2sat=90% on RA;  HEENT- neg, mallampati2;  Chest- decr BS bilat, few basilar rales, no wheezing or consolidation;  Heart- RR gr1-2 SEM w/o r/g;  Abd- obese soft neg;  Ext- swollen/ tender wrists & hands!  CXR 03/13/15 in ER> mild cardiomeg, COPD/ hyperinflation, prominent lung markings bilat, no acute changes...   EKG 03/13/15 in ER> sinus tachy, rate 106/min, NSSTTWA, NAD...  Ambulatory O2sat test 03/14/15> O2sat=91% on RA at rest w/ pulse=86/min; on standing w/ min walk O2sat dropped to 85% w/ pulse=106/min... IMP >>      New ILD per CXR & CT Chest- c/w COP/BOOP, prob related to Reumatoid Lung>  We started Pred 40=>20 but this was further cut to 10mg /d by [facility] in the interval; pulm status is clearly no better, ?sl worse, and she needs HOME O2 to be started rec 1-2L/min rest & 2L/min exercise => hypoxemic resp failure...      Underlying COPD/emphysema, former smoker>  She is on Symbicort160-2spBid, Spiriva daily, Xopenex nebs Tid, Mucinex and rec to take meds regularly everyday...      Hx mult small pulmonary nodules seen on prev CT scans in 2015> recent CT 01/2015 showed these nodules were masked by the ground glass opac & interstitial process; this will need to be followed w/ serial CXR/ CT scans...      OSA- eval & Rx per [facility], no data avail in Epic>  Per DrStoneking & Maxwell Caul...      GERD/ prob LPR>  She needs a vigorous antireflux regimen w/ PPI Bid, NPO after dinner, elev HOB 6"; DrMJohnson is her GI...      Abn collagen-vasc screen w/ +RA, +Anti-CCP, +ANA, Sed54> Looks like RA-  She was prev treated by DrLevitin for  Lupus 1997-2000;  She was referred to [facility] for Rheum & her note is pending... PLAN >>       She is asked to keep the Pred at 10mg  per day for now due to her pulmonary process;  She has ROV tomorrow w/ Rheum for follow up & to eval her acute hand, wrist, foot arthritic process;  We are starting HOME O2 due to her acute hypoxemic resp failure... We will follow up in 54month, sooner if needed. ADDENDUM>> Pt seen by [facility] 11/30-- seropos RA, OA of both knees, ILD;  Insurance won't cover TNFs therefore started on ARAVA20 and Pred increased to 20mg /d... ADDENDUM>> Full PFTs 03/30/15:  FVC=2.23 (73%), FEV1=1.50 (65%), %1sec=68, mid-flows reduced at 51% predicted; post bronchodil the FEV1 improved 6%; TLC=4.13 (77%), RV=1.80 (75%), RV/TLC=44; DLCO=50% predicted;  These PFTs are compatible w/ mild restriction and a superimposed mild obstructive defect w/ a mod-severe reduction is DLCO...  ~  May 24, 2015:  58mo ROV w/ SN>  Brennan notes that he breathing is improved w/ her home O2 therapy but she would like an Inogen One POC & we will request this from her DME;  She notes SOB/DOE stable- no progression, and mild dry cough w/o sput, no CP etc... She has remained on Pred per [facility], no recent notes scanned into Epic; on Pred20/d + Arava and she is checked Q59mo w/ slow Pred taper...      ILD/ pulm fibrosis per CXR & CT Chest- c/w COP/BOOP, prob related to Reumatoid Lung>  On Pred & Arava per DrHawkes, Rheum w/ freq OVs and slow taper of the Pred.Marland KitchenMarland Kitchen  Underlying COPD/emphysema, former smoker>  on Symbicort160-2spBid, Spiriva daily, Xopenex nebs Tid, Mucinex and rec to take meds regularly everyday...      Hypoxemic resp failure>  On Home O2 at 2L/min w/ O2sat=97% today in office; she is requesting a smaller POC- wants the Inogen One...      Hx mult small pulmonary nodules seen on prev CT scans in 2015> recent CT 01/2015 at [place] ([facility]) showed these nodules were masked by the ground glass opac &  interstitial process; CXR 11/16 w/ incr interstitial markings.      OSA- eval & Rx per [facility], no data avail in Epic>  per DrStoneking & Maxwell Caul...      GERD/ prob LPR>  She needs a vigorous antireflux regimen w/ PPI (Prilosec40) Bid, NPO after dinner, elev HOB 6"; DrMJohnson is her GI...      Abn collagen-vasc screen w/ +RA, +Anti-CCP, +ANA, Sed54> Looks like RA-  She was prev treated by DrLevitin for Lupus 1997-2000;  She saw [facility] 02/2015 & started on ARAVA while slowly weaning Pred. EXAM shows Afeb, VSS, O2sat=97% on 2L/min pulse dose;  Wt=267#, 5'7"Tall, BMI=40;  HEENT- neg, mallampati2;  Chest- decr BS bilat, few basilar rales, no wheezing or consolidation;  Heart- RR gr1-2 SEM w/o r/g;  Abd- obese soft neg;  Ext- swollen/ sl tender wrists & hands! IMP/PLAN>>  She is slowly weaning the Pred per Rheum & taking the Arava20;  We will request a POC device for her;  We plan ROV in 79mo w/ f/u CXR/ CT Chest...  ~  July 24, 2015:  41mo ROV and Melvie notes that her breathing is stable> she is an ex-smoker w/ mixed obstructive (COPD/emphysema) & restrictive (ILD w/ prob RA and obesity) lung dis, chronic hypoxemic resp failure on HomeO2;  She has been using her Symbocort160-2spBid, Spiriva daily, Mucinex600- one tab daily; she has not been using her nebulizer and notes that her breathing is good;  She is followed by DrHawkes Q40mo on Arava & slow Pred taper- currently on 10mg /d...      ILD/ pulm fibrosis per CXR & CT Chest- c/w COP/BOOP, prob related to Reumatoid Lung>  On Pred (currently 10mg /d) & Arava per DrHawkes, Rheum w/ freq OVs and slow taper of the Pred...      Underlying COPD/emphysema, former smoker>  on Symbicort160-2spBid, Spiriva daily, Xopenex nebs Tid (only using this prn), Mucinex and rec to take meds regularly everyday...      HEx-induced hypoxemic resp failure>  On Home O2 at 2L/min w/ O2sat=97% on 2L in office today on her new "simply go" POC...       Hx mult small pulmonary  nodules seen on prev CT scans in 2015> CT 01/2015 at [place] ([facility]) showed these nodules were masked by the ground glass opac & interstitial process; CXR 11/16 w/ incr interstitial markings.      OSA- eval & Rx per [facility], no data avail in Epic>  per DrStoneking & Maxwell Caul...      GERD/ prob LPR>  She needs a vigorous antireflux regimen w/ PPI (Prilosec40) Bid, NPO after dinner, elev HOB 6"; DrMJohnson is her GI...      Abn collagen-vasc screen w/ +RA, +Anti-CCP, +ANA, Sed54> Looks like RA-  She was prev treated by DrLevitin for Lupus 1997-2000;  She saw [facility] 02/2015 & started on ARAVA while slowly weaning Pred. EXAM shows Afeb, VSS, O2sat=97% on 2L/min pulse dose;  Wt=267#, 5'7"Tall, BMI=42;  HEENT- neg, mallampati2;  Chest- decr BS bilat,  few basilar rales, no wheezing or consolidation;  Heart- RR gr1-2 SEM w/o r/g;  Abd- obese soft neg;  Ext- much improved wrists & hands  CT Chest => done 07/28/15> norm heart size, atherosclerotic changes in Ao & LAD, mod centrilobular & paraseptal emphysema bilaterally, mult pulmonary nodules are unchanged from 06/2013 scan and felt c/w benign process, NO MENTION MADE OF INTERSTITIAL PROCESS SEEN ON CT CHEST 01/26/15 from [facility] => now largely resolved...    IMP/PLAN>>  Ceara's breathing is stable, at her new baseline & we will proceed w/ new CT Chest to compare to the old; slow Pred taper per rheum and she desperately needs to get on diet & get weight down; continue O2, Symbicort, Spiriva, etc...  ~  January 22, 2016:  62mo ROV w/ SN>  Aleisha reports an acute exac of her COPD starting several days ago w/ cough, greenish sput production, mod chest congestion, no hemoptysis, sl incr SOB but no CP/ palpait/ edema; she remains on Home O2 at 1-2L/min, NEBS w/ Xopenex Tid, Symbicort160-2spBid, Spiriva daily; Her Pred was boosted to 12.5mg /d in August by [facility] for an RA flair at that time & she continues on the [place] daily;  We discussed treatment for  this AB exac w/ Levaquin + probiotic...       ILD/ pulm fibrosis per CXRs & prev CT Chest- c/w COP/BOOP, prob related to Reumatoid Lung>  On Pred (currently 12.5mg /d per DrHawkes) & Arava, Rheum w/ freq OVs and slow taper of the Pred planned...      Underlying mixed COPD w/ emphysema & chr bronchitis, former smoker>  on Symbicort160-2spBid, Spiriva daily, Xopenex nebs Tid (only using this prn), Mucinex and rec to take meds regularly everyday...      Hx ex-induced hypoxemic resp failure>  On Home O2 at 2L/min w/ O2sat=97% on 2L in office today on her new "simply go" POC...       Hx mult small pulmonary nodules seen on prev CT scans in 2015> CT 01/2015 at [place] ([facility]) showed these nodules were masked by the ground glass opac & interstitial process; CXR 11/16 w/ incr interstitial markings; f/u CT 07/2015 showed stable pulm nodules and resolution of the interstitial process prev seen...      OSA- eval & Rx per [facility], no data avail in Epic>  per DrStoneking & Maxwell Caul...      Obesity>  Dillan weighs ~270#, 5'7"Tall, BMI=42; she has been counseled on DIET/ EXERCISE/ wt reducing strategies...      GERD/ prob LPR>  She needs a vigorous antireflux regimen w/ PPI (Prilosec40) Bid, NPO after dinner, elev HOB 6"; DrMJohnson is her GI...      Abn collagen-vasc screen w/ +RA, +Anti-CCP, +ANA, Sed54> +RA-  She was prev treated by DrLevitin for Lupus 1997-2000;  She saw [facility] 02/2015 & started on ARAVA while slowly weaning Pred. EXAM shows Afeb, VSS, O2sat=100% on 1L/min pulse dose;  Wt=268#, 5'7"Tall, BMI=40;  HEENT- neg, mallampati2;  Chest- decr BS bilat, few basilar rales, no wheezing or consolidation;  Heart- RR gr1-2 SEM w/o r/g;  Abd- obese soft neg;  Ext- much improved wrists & hands IMP/PLAN>>  I placed Lakenya on Levaquin for this bronchitic flair & rec that she take a probiotic like Align while on the antibiotic;  She will continue her O2, Prednisone, Symbicort, Spiriva, & Mucinex;  Reminded  about the recommended antireflux regimen;  Also reminded about diet/ exercise & wt reduction strategies...  ~  July 22, 2016:  1mo ROV & pulmonary recheck> Her PCP is DrStoneking, & Rheum- DrHawkes=> outpt rehab for difficullty walking...  She states her breathing is good, no change; notes incr SOB w/ activities but ADLs are still ok (eg-  Vacuums and takes her time, but stairs are bad)... On O2 at 1L/min rest & 2L/min activity; current Pred taper, Symbicort160-2spBid, Spiriva once daily,  XOPENEX NEBS but not using Tid- just does this Prn she says & "hasn't needed";  She has Mucinex & Claritin for as needed use too;  She remains on ARAVA per [facility]...    EXAM shows Afeb, VSS, O2sat=96% on 1L/min pulse dose;  Wt=276#, 5'7"Tall, BMI=43;  HEENT- neg, mallampati2;  Chest- decr BS bilat, few basilar rales, no wheezing or consolidation;  Heart- RR gr1-2 SEM w/o r/g;  Abd- obese soft neg;  Ext- much improved wrists & hands.  CXR 07/22/16>  Norm heart size, hyperinflation c/w COPD, mild chr interstitial thickening- no focal abn & NAD...  IMP/PLAN>>  Dashanae appears stable but must desperately lose the weight!  She requests a Rx for [facility] to keep on hand & we discussed this- must DIET/ EXERCISE/ get wt down! Continue same meds...  ~  November 12, 2016:  21mo ROV & Joelene called for an acute visit- add on appt due to a 4d hx CP & SOB, notes substernal CP that is incr w/ a deep breath/ movement/ & thru to her back; her SOB is a sensation of not getting a deep breath;  I note that her weight is down 9# to 267# today & she is watching her intake but still not exercising; notes mild cough, small amt clear sput, no hemoptysis, no f/c/s, no edema etc... She remains on Rx w/ O2 at 2L/min w/ activ & 1L/min rest; NEBs w/ Xopenex Tid; Symbicort160-2spBid & Spiriva once daily; Mucinex600 once daily; Levaquin for prn use;  She remains on Pred7.5mg /d, Plaquenil200Bid, & Arava per [facility] (last note 07/09/16);  She remains on  Alendronate70, Betances per DrStoneking, but off Lipitor due to leg cramps...  We discussed further eval w/ f/u CXR, EKG, ambulatory oximetry today>>    EXAM shows Afeb, VSS, O2sat=95% on 1L/min pulse dose;  Wt=267#, 5'7"Tall, BMI=42;  HEENT- neg, mallampati2;  Chest- decr BS bilat, few basilar rales, no wheezing or consolidation;  Heart- RR gr1-2 SEM w/o r/g;  Abd- obese soft neg;  Ext- much improved wrists & hands.  CXR 11/12/16 (independently reviewed by me in the PACS system) showed heart at upper lim of norm, hyperinflation c/w COPD, chr interstitial prominence (stable)- NAD.Marland KitchenMarland Kitchen   EKG 11/12/16 shows NSR, rate75, wnl...  Ambulatory Oximetry 11/12/16 on 1L/min showed O2sat=94% at rest w/ pulse=85/min;  She ambulated only 2 Laps (185'ea) on 1L/min w/ lowest O2sat=89% w/ heart rate= 95/min;  She could not go further due to leg fatigue...  IMP/PLAN>>  Chaselyn seems to have developed some CWP/ inflamm & has assoc SOB/DOE fom this & chest wall factors- her COPD, ?rheumatoid interstitial dis, etc- all appears stable;  She is rec to REST, apply HEAT, & use Tramadol50 + Tylenol for the pain;  She will continue her current breathing meds regularly & over the longer term must get on diet, incr exercise, & must lose weight... She will call for any worsening symptoms and f/u w/ [place] in 2-80mo...  ~  January 21, 2017:  28mo ROV & pulm f/u visit>  Linna reports a good interval- her CWP/inflamm resolved w/ the rest, heat, Tramadol; and her assoc  SOB has resolved; min cough, no sput at present; she tells me that she has stopped doing her NEBS w/ Xopenex (stopped on her own) but she has continued the Symbicort160-2spBid & Spiriva daily regularly;  She also has Home O2 & received a smaller simply Go Mini last year but now she tells me "I don't use it when I go outside" (supposed to be on 1L/min rest & 2L/min exercise);  In addition Tabathia has not lost any weight- still at 268# w/ BMI=42- we again reviewed the importanceof  using her O2 w/ exercise, getting on diet & getting her weight down!  We reviewed the following medical problems during today's office visit >>       ILD/ pulm fibrosis per CXRs & prev CT Chest- c/w COP/BOOP, prob related to Reumatoid Lung>  On Pred (currently 7.5mg /d per DrHawkes) + Plaquenil & Arava per Rheum w/ freq OVs and slow taper of the Pred planned.      Underlying mixed COPD w/ emphysema & chr bronchitis, former smoker>  on Symbicort160-2spBid, Spiriva daily, Xopenex nebs Tid (only using this prn), Mucinex and rec to take meds regularly everyday...      Hx ex-induced hypoxemic resp failure>  On Home O2 at 2L/min w/ O2sat=97% on 2L in office today on her new "simply go" POC...       Hx mult small pulmonary nodules seen on prev CT scans in 2015> CT 01/2015 at [place] ([facility]) showed these nodules were masked by the ground glass opac & interstitial process; CXR 11/16 w/ incr interstitial markings; f/u CT 07/2015 showed stable pulm nodules and resolution of the interstitial process prev seen...      OSA- eval & Rx per [facility], no data avail in Epic>  per DrStoneking & Maxwell Caul...      Obesity>  Elody weighs ~268#, 5'7"Tall, BMI=42; she has been counseled on DIET/ EXERCISE/ wt reducing strategies...      GERD/ prob LPR>  She needs a vigorous antireflux regimen w/ PPI (Prilosec40) Bid, NPO after dinner, elev HOB 6"; DrMJohnson is her GI...      Abn collagen-vasc screen w/ +RA, +Anti-CCP, +ANA, Sed54>  She was prev treated by DrLevitin for Lupus 1997-2000;  She saw [facility] 02/2015 & started on ARAVA while slowly weaning Pred. EXAM shows Afeb, VSS, O2sat=97% on 2L/min pulse dose;  Wt=268#, 5'7"Tall, BMI=40;  HEENT- neg, mallampati2;  Chest- decr BS bilat, few basilar rales, no wheezing or consolidation;  Heart- RR gr1-2 SEM w/o r/g;  Abd- obese soft neg;  Ext- much improved wrists & hands IMP/PLAN>>  Problems as noted and she is stable on current regimen- Pred/ Plaquenil/ Arava per Rheum and  O2, NEBS, Symbicort/ Spiriva, and Mucinex from [place];  We again reviewed the imperative to lose weight thru diet/ exercise/ etc...   ~  May 26, 2017:  50mo ROV and pulmonary follow up visit>  Galligan is a 77y/o F w/ hx seropos RA, underlying pulm fibrosis & mixed COPD (chr bronchitis & emphysema), ex-smoker w/ ~50 pack-yr hx & quit ~2003, hypoxemic resp failure on Home O2 at 2L/min, OSA on CPAP per [facility] [place], morbid obesity, sedentary lifestyle & deconditioning;  She is also quite anxious & this contributes another layer to her chr dyspnea...  She reports that she had an RA "flair-up" about 58mo ago & DrHawkes increased her Pred, continued the [place] & adjusted her Plaquenil;  She was also having some incr RLS symptoms and DrStoneking started her on Mirapex (we  do not have notes from these providers)...  We reviewed the following medical problems during today's office visit>        ILD/ pulm fibrosis per CXRs & prev CT Chest c/w COP/BOOP & prob underlying Reumatoid Lung>  On Pred (currently 7.5mg /d per DrHawkes) + Plaquenil & Arava per Rheum w/ freq OVs and slow taper of the Pred per Rheum... Last CXR 10/2016 showed heart at upper lim of norm, hyperinflation c/w COPD, chr interstitial prominence (stable)- NAD      Underlying mixed COPD w/ emphysema & chr bronchitis, former smoker quit 2003>  on Symbicort160-2spBid, Spiriva daily, Xopenex nebs Tid (only using this prn), Mucinex and rec to take meds regularly everyday...      Hx ex-induced hypoxemic resp failure>  On Home O2 at 2L/min w/ O2sat=96% on 1L in office today on her new "simply go" POC...       Hx mult small pulmonary nodules seen on prev CT scans in 2015> CT 01/2015 at [place] ([facility]) showed these nodules were masked by the ground glass opac & interstitial process; CXR 11/16 w/ incr interstitial markings; f/u CT 07/2015 showed stable pulm nodules and resolution of the interstitial process prev seen...      OSA- eval & Rx per  [facility], no data avail in Epic>  per DrStoneking & Maxwell Caul...      Obesity>  Cloey weighs ~282# now, 5'7"Tall, BMI=44; she has been counseled on DIET/ EXERCISE/ wt reducing strategies & she understands that weight reduction is critical to her improving her breathing...      GERD/ prob LPR>  She needs a vigorous antireflux regimen w/ PPI (Prilosec40) Bid, NPO after dinner, elev HOB 6"; DrMJohnson is her GI...      Abn collagen-vasc screen w/ +RA, +Anti-CCP, +ANA, Sed54>  She was prev treated by DrLevitin for Lupus 1997-2000;  She saw [facility] 02/2015 & started on ARAVA while slowly weaning Pred. EXAM shows Afeb, VSS, O2sat=96% on 1L/min pulse dose;  Wt=282#, 5'7"Tall, BMI=44;  HEENT- neg, mallampati2;  Chest- decr BS bilat, few basilar rales, no wheezing or consolidation;  Heart- RR gr1-2 SEM w/o r/g;  Abd- obese soft neg;  Ext- much improved wrists & hands  NO RECENT LAB DATA TO REVIEW IN Epic> pt indicates that labs done by DrStoneking & DrHawkes IMP/PLAN>>  Popson has mult medical issues and a good bit of anxiety & stress;  Her RA & mild underlying ILD is controlled w/ Pred/ Arava/ Plaquenil from [facility];  Her mixed COPD is treated w/ NEBS, Symbicort, Spiriva, Mucinex;  Her hypoxemic resp failure w/ O2;  Her OSA w/ CPAP qhs from [place];  She is morbidly obese & desperately needs to lose some weight- we reviewed DIET, EXERCISE, wt reduction strategies & she should review this further w/ her PCP (they also monitor her labs), she is way too sedentary & deconditioned!  Finally she is quite anxious & under some stress- hopefully she will be able to de-stress her life, and that the [facility] 0.5mg  - 1/2 to 1 tab bid will also provide a measure of relief...   ~  November 24, 2017:  51mo ROV & pt had called [place] for new CPAP machine (current apparatus from 2003) but DrStoneking/ [facility] handles her OSA/ CPAP eval & Rx, we contacted their office & they have initiated the process for a new machine...  Pt saw TNichols,NP in our office 11/20/17 for incr dyspnea, awoke at 4AM smothering, SOB all day long, cannot take a  deep breath "in", has not used her nebulizer but is taking her Symbicort & spiriva, she remains on O2 at 1-2L/min flow; her exam was unremarkable x for anxiety, wt down 7# to 275#; she started her back on low dose Ativan 0.5mg Bid...     NOTE: she had bilat cataract surg in [id number] by DrShapiro...    She saw RHEUM- DrHawkes on 07/15/17>  f/u RA, underlying mild ILD, on Pred10mg  & tried to decr but had incr joint pain, also stated that she had incr dyspnea (but we know this to be due to a combination of anxiety & obesity and not so much from her rheum fibrosis); she continues on [place]; they did labs but these results are not avail to [place] to review...  Today Shadi reports that her breathing is better, she feels she can get a deeper breath, feels better breathing, denies chest tightness, wheezing etc; she has chr stable DOE 7 multifactorial dyspnea...  We reviewed the following medical problems during today's office visit>        ILD/ pulm fibrosis per CXRs & prev CT Chest c/w COP/BOOP & prob underlying Reumatoid Lung>  On Pred (currently 10mg /d per DrHawkes) + Plaquenil & Arava per Rheum w/ freq OVs and slow taper of the Pred per Rheum... Last CXR 10/2016 showed heart at upper lim of norm, hyperinflation c/w COPD, chr interstitial prominence (stable)- NAD... We repeated CXR today 11/24/17 and it is stable, no acute changes...      Underlying mixed COPD w/ emphysema & chr bronchitis, former smoker quit 2003>  on Symbicort160-2spBid, Spiriva daily, Xopenex nebs Tid (only using this prn), Mucinex and rec to take meds regularly everyday...      Hx ex-induced hypoxemic resp failure>  On Home O2 at 2L/min w/ O2sat=96% on 1L in office today on her new "simply go" POC...       Hx mult small pulmonary nodules seen on prev CT scans in 2015> CT 01/2015 at [place] ([facility]) showed these nodules were  masked by the ground glass opac & interstitial process; CXR 11/16 w/ incr interstitial markings; f/u CT 07/2015 showed stable pulm nodules and resolution of the interstitial process prev seen (COP/BOOP)      OSA- eval & Rx per [facility], no data avail in Epic>  per DrStoneking & Maxwell Caul => [place] requests new machine & she is referred back to [place] [facility]...      Obesity>  Hiba weighs ~273# now, 5'7"Tall, BMI=43; she has been counseled on DIET/ EXERCISE/ wt reducing strategies & she understands that weight reduction is critical to her improving her breathing...      GERD/ prob LPR>  She needs a vigorous antireflux regimen w/ PPI (Prilosec40) Bid, NPO after dinner, elev HOB 6"; DrMJohnson is her GI...      Abn collagen-vasc screen w/ +RA, +Anti-CCP, +ANA, Sed54> SEROPOS RA>  She was prev treated by DrLevitin for Lupus 1997-2000;  She saw [facility] 02/2015 & started on ARAVA while slowly weaning Pred. EXAM shows Afeb, VSS, O2sat=96% on 1L/min pulse dose;  Wt=273#, 5'7"Tall, BMI=43;  HEENT- neg, mallampati2;  Chest- decr BS bilat, few basilar rales, no wheezing or consolidation;  Heart- RR gr1-2 SEM w/o r/g;  Abd- obese soft neg;  Ext- much improved wrists & hands  CXR 11/24/17 (independently reviewed by me in the PACS system) showed stable heart size, aortic atherosclerotic changes, stable large lung volumes + coarse lung markings- NO ACUTE CHANGES...  Again there is no recent lab data  avail for [place] to review- pt indicates that labs are followed by [facility] and [place] & not in Epic-EMR... IMP/PLAN>>  Juliahna has a long hx of multifactorial dyspnea- exsmokes w/ COPD, hx COP/BOOP improved on Pred rx, prob mild underlying rheumatoid fibrosis stable on her slow Pred wean per [facility], morbid obesity w/ pulm restriction and OSA on CPAP per [facility], hypoxemic resp failure on home O2, ANXIETY w/ chr dyspnea and sensation of not being able to get a deep breath "in" improved on Ativan... She is reassured &  requested to continue her O2, Symbicort, Spiriva, Xopenex NEBS, Ativan, and Pred taper pper DrHawkes... We will recheck pt in 90mo prior to my planned retirement.    Past Medical History:  Diagnosis Date  . Allergic rhinitis   . Asthmatic bronchitis   . Colon polyps   . COPD (chronic obstructive pulmonary disease) ([place])   . Diverticulosis   . GERD (gastroesophageal reflux disease)   . H/O: GI bleed   . Hypercholesterolemia >> PCP is DrStoneking   . Migraine headache   . Obesity   . RLS (restless legs syndrome)    Osteoarthritis    RHEUMATOID ARTHRITIS >> followed by [facility]     Past Surgical History:  Procedure Laterality Date  . TONSILLECTOMY  x2  . TOTAL ABDOMINAL HYSTERECTOMY  02/17/1989    Outpatient Encounter Medications as of 11/24/2017  Medication Sig  . acetaminophen (TYLENOL) 500 MG tablet Take 1,000 mg by mouth every 6 (six) hours as needed for moderate pain.  Marland Kitchen alendronate (FOSAMAX) 70 MG tablet   . Ascorbic Acid (VITAMIN C) 1000 MG tablet Take 1,000 mg by mouth daily.  Marland Kitchen aspirin 81 MG tablet Take 81 mg by mouth every other day.  . budesonide-formoterol (SYMBICORT) 160-4.5 MCG/ACT inhaler Inhale 2 puffs into the lungs 2 (two) times daily.  . Calcium Carbonate-Vitamin D (CALCIUM + D) 600-200 MG-UNIT TABS Take 1 tablet by mouth daily.    Marland Kitchen guaiFENesin (MUCINEX) 600 MG 12 hr tablet Take 600 mg by mouth daily.   . hydroxychloroquine (PLAQUENIL) 200 MG tablet Take 200 mg by mouth 2 (two) times daily.  . Leflunomide (ARAVA PO) Take by mouth daily.  Marland Kitchen levalbuterol (XOPENEX) 1.25 MG/3ML nebulizer solution Take 1.25 mg by nebulization 3 (three) times daily. (Patient taking differently: Take 1.25 mg by nebulization 3 (three) times daily as needed. )  . lisinopril (PRINIVIL,ZESTRIL) 5 MG tablet Take 5 mg by mouth daily.   Marland Kitchen loratadine (CLARITIN) 10 MG tablet Take 10 mg by mouth daily.    Marland Kitchen LORazepam (ATIVAN) 0.5 MG tablet Take 1 tablet (0.5 mg total) by mouth 2 (two) times  daily.  . Omega-3 Fatty Acids (FISH OIL) 1200 MG CAPS Take 1 capsule by mouth daily.    Marland Kitchen omeprazole (PRILOSEC) 40 MG capsule Take 1 capsule by mouth 2 (two) times daily.  . OXYGEN Inhale into the lungs. 2 lpm with rest and exertion  . pramipexole (MIRAPEX) 0.5 MG tablet   . predniSONE (DELTASONE) 5 MG tablet Take 10 mg by mouth daily with breakfast.   . traMADol (ULTRAM) 50 MG tablet Take 1/2 to 1 tablet TID with Extra Strength Tylenol  . valACYclovir (VALTREX) 1000 MG tablet Take 1,000 mg by mouth daily as needed (for breakouts).   . [DISCONTINUED] budesonide-formoterol (SYMBICORT) 160-4.5 MCG/ACT inhaler Inhale 2 puffs into the lungs 2 (two) times daily.  . [DISCONTINUED] LORazepam (ATIVAN) 0.5 MG tablet Take 1 tablet (0.5 mg total) by mouth 2 (two) times daily.  Marland Kitchen  Tiotropium Bromide Monohydrate (SPIRIVA RESPIMAT) 1.25 MCG/ACT AERS Inhale 2 puffs into the lungs daily.   No facility-administered encounter medications on file as of 11/24/2017.     Allergies  Allergen Reactions  . Shellfish Allergy Anaphylaxis    With vomiting and diarrhea  . Macrolides And Ketolides   . Penicillins     Facial numbness  . Sertraline Hcl   . Sulfa Antibiotics     Facial numbness    Immunization History  Administered Date(s) Administered  . Influenza, High Dose Seasonal PF 01/17/2016, 01/14/2017  . Influenza-Unspecified 12/30/2014  . Pneumococcal Conjugate-13 07/07/2013  . Pneumococcal Polysaccharide-23 03/03/2009  . Zoster Recombinat (Shingrix) 12/14/2016    Current Medications, Allergies, Past Medical History, Past Surgical History, Family History, and Social History were reviewed in [employer] record.   Review of Systems             All symptoms NEG except where BOLDED >>  Constitutional:  F/C/S, fatigue, anorexia, unexpected weight change. HEENT:  HA, visual changes, hearing loss, earache, nasal symptoms, sore throat, mouth sores, hoarseness. Resp:  cough, sputum,  hemoptysis; SOB, tightness, wheezing. Cardio:  CP, palpit, DOE, orthopnea, edema. GI:  N/V/D/C, blood in stool; reflux, abd pain, distention, gas. GU:  dysuria, freq, urgency, hematuria, flank pain, voiding difficulty. MS:  joint pain, swelling, tenderness, decr ROM; neck pain, back pain, etc. Neuro:  HA, tremors, seizures, dizziness, syncope, weakness, numbness, gait abn. Skin:  suspicious lesions or skin rash. Heme:  adenopathy, bruising, bleeding. Psyche:  confusion, agitation, sleep disturbance, hallucinations, anxiety, depression suicidal.   Objective:   Physical Exam       Vital Signs:  Reviewed...   General:  WD, obese, 77 y/o WF in NAD; alert & oriented; pleasant & cooperative... HEENT:  [place]/AT; Conjunctiva- pink, Sclera- nonicteric, EOM-wnl, PERRLA, EACs-clear, TMs-wnl; NOSE-clear; THROAT-clear & wnl.  Neck:  Supple w/ fair ROM; no JVD; normal carotid impulses w/o bruits; no thyromegaly or nodules palpated; no lymphadenopathy.  Chest:  Decr BS bilat, few basilar rales, no wheezing rhonchi or signs of consolidation... Heart:  Regular Rhythm; norm S1 & S2 gr1-2/6 SEM, w/o rubs or gallops detected. Abdomen:  Soft & nontender- no guarding or rebound; normal bowel sounds; no organomegaly or masses palpated. Ext:  decrROM; without deformities +arthritic changes; no varicose veins, +venous insuffic, tr edema;  Pulses intact w/o bruits. Neuro:  No focal neuro deficits; sensory testing normal; gait OK & balance OK. Derm:  No lesions noted; no rash etc. Lymph:  No cervical, supraclavicular, axillary, or inguinal adenopathy palpated.   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      Underlying COPD/emphysema, former smoker>  on Symbicort160-2spBid, Spiriva daily, Xopenex nebs Tid (only using this prn), Mucinex and rec to  take meds regularly everyday...      Hypoxemic resp failure>  On Home O2 at 2L/min w/ O2sat=97% on 2L in office today on her new "simply go" POC...       Hx mult small pulmonary nodules seen on prev CT scans in 2015> CT 01/2015 at [place] ([facility]) showed these nodules were masked by the ground glass opac & interstitial process; CXR 11/16 w/ incr interstitial markings.      OSA- eval & Rx per [facility], no  data avail in Epic>  per DrStoneking & Maxwell Caul...      GERD/ prob LPR>  She needs a vigorous antireflux regimen w/ PPI (Prilosec40) Bid, NPO after dinner, elev HOB 6"; DrMJohnson is her GI...      Abn collagen-vasc screen w/ +RA, +Anti-CCP, +ANA, Sed54> Looks like RA-  She was prev treated by DrLevitin for Lupus 1997-2000;  She saw [facility] 02/2015 & started on ARAVA while slowly weaning Pred.  PLAN >>  02/10/15>  Start Pred 20mg  tabs- 40mg  Qam for 2 wks, then 20mg /d til return;  Refer to [place], ASAP;  Continue other meds reglarly (Symbicort, Spiriva, Xopenex, Mucinex);  Needs vigorous antireflux regimen... 03/14/15>  She is asked to keep the Pred at 10mg  per day for now due to her pulmonary process;  She has ROV tomorrow w/ Rheum for follow up & to eval her acute hand, wrist, foot arthritic process;  We are starting HOME O2 due to her acute hypoxemic resp failure...  05/24/15>   She has established w/ DrHawkes for Rheum on slow Pred taper + Arava;  Breathing is stable on O2, Pred, XopenexTid, Symbicort160, Spiriva, and Mucinex... 07/24/15>   Simmie's breathing is stable, at her new baseline & we will proceed w/ new CT Chest to compare to the old; slow Pred taper per rheum and she desperately needs to get on diet & get weight down; continue O2, Symbicort, Spiriva, etc... 01/22/16>   I placed Ainara on Levaquin for this bronchitic flair & rec that she take a probiotic like Align while on the antibiotic;  She will continue her O2, Prednisone, Symbicort, Spiriva, & Mucinex;  Reminded about the  recommended antireflux regimen;  Also reminded about diet/ exercise & wt reduction strategies 11/12/16>   Lorilei seems to have developed some CWP/ inflamm & has assoc SOB/DOE fom this & chest wall factors- her COPD, ?rheumatoid interstitial dis, etc- all appears stable;  She is rec to REST, apply HEAT, & use Tramadol50 + Tylenol for the pain;  She will continue her current breathing meds regularly & over the longer term must get on diet, incr exercise, & must lose weight... She will cll for any worsening symptoms and f/u w/ [place] in 2-1mo. 01/21/17>   Problems as noted and she is stable on current regimen- Pred/ Plaquenil/ Arava per Rheum and O2, NEBS, Symbicort/ Spiriva, and Mucinex from [place];  We again reviewed the imperative to lose weight thru diet/ exercise/ etc 05/26/17>   Melito has mult medical issues and a good bit of anxiety & stress;  Her RA & mild underlying ILD is controlled w/ Pred/ Arava/ Plaquenil from [facility];  Her mixed COPD is treated w/ NEBS, Symbicort, Spiriva, Mucinex;  Her hypoxemic resp failure w/ O2;  Her OSA w/ CPAP qhs from [place];  She is morbidly obese & desperately needs to lose some weight- we reviewed DIET, EXERCISE, wt reduction strategies & she should review this further w/ her PCP (they also monitor her labs), she is way too sedentary & deconditioned!  Finally she is quite anxious & under some stress- hopefully she will be able to de-stress her life, and that the [facility] 0.5mg  - 1/2 to 1 tab bid will also provide a measure of relief. 11/24/17>   Leler has a long hx of multifactorial dyspnea- exsmokes w/ COPD, hx COP/BOOP improved on Pred rx, prob mild underlying rheumatoid fibrosis stable on her slow Pred wean per [facility], morbid obesity w/ pulm restriction and OSA on CPAP per  [facility]Dr[facility], hypoxemic resp failure on home O2, ANXIETY w/ chr dyspnea and sensation of not being able to get a deep breath "in" improved on Ativan... She is reassured & requested to continue her  O2, Symbicort, Spiriva, Xopenex NEBS, Ativan, and Pred taper pper DrHawkes... We will recheck pt in 24mo prior to my planned retirement.   Plan:                                      HOME O2 at 1-2L/min at rest and 2L/min w/ activity... Patient's Medications  New Prescriptions   No medications on file  Previous Medications   ACETAMINOPHEN (TYLENOL) 500 MG TABLET    Take 1,000 mg by mouth every 6 (six) hours as needed for moderate pain.   ALENDRONATE (FOSAMAX) 70 MG TABLET       ASCORBIC ACID (VITAMIN C) 1000 MG TABLET    Take 1,000 mg by mouth daily.   ASPIRIN 81 MG TABLET    Take 81 mg by mouth every other day.   BUDESONIDE-FORMOTEROL (SYMBICORT) 160-4.5 MCG/ACT INHALER    Inhale 2 puffs into the lungs 2 (two) times daily.   CALCIUM CARBONATE-VITAMIN D (CALCIUM + D) 600-200 MG-UNIT TABS    Take 1 tablet by mouth daily.     GUAIFENESIN (MUCINEX) 600 MG 12 HR TABLET    Take 600 mg by mouth daily.    HYDROXYCHLOROQUINE (PLAQUENIL) 200 MG TABLET    Take 200 mg by mouth 2 (two) times daily.   LEFLUNOMIDE (ARAVA PO)    Take by mouth daily.   LEVALBUTEROL (XOPENEX) 1.25 MG/3ML NEBULIZER SOLUTION    Take 1.25 mg by nebulization 3 (three) times daily.   LISINOPRIL (PRINIVIL,ZESTRIL) 5 MG TABLET    Take 5 mg by mouth daily.    LORATADINE (CLARITIN) 10 MG TABLET    Take 10 mg by mouth daily.     OMEGA-3 FATTY ACIDS (FISH OIL) 1200 MG CAPS    Take 1 capsule by mouth daily.     OMEPRAZOLE (PRILOSEC) 40 MG CAPSULE    Take 1 capsule by mouth 2 (two) times daily.   OXYGEN    Inhale into the lungs. 2 lpm with rest and exertion   PRAMIPEXOLE (MIRAPEX) 0.5 MG TABLET       PREDNISONE (DELTASONE) 5 MG TABLET    Take 10 mg by mouth daily with breakfast.    TIOTROPIUM BROMIDE MONOHYDRATE (SPIRIVA RESPIMAT) 1.25 MCG/ACT AERS    Inhale 2 puffs into the lungs daily.   TRAMADOL (ULTRAM) 50 MG TABLET    Take 1/2 to 1 tablet TID with Extra Strength Tylenol   VALACYCLOVIR (VALTREX) 1000 MG TABLET    Take 1,000 mg by  mouth daily as needed (for breakouts).   Modified Medications   Modified Medication Previous Medication   LORAZEPAM (ATIVAN) 0.5 MG TABLET LORazepam (ATIVAN) 0.5 MG tablet      Take 1 tablet (0.5 mg total) by mouth 2 (two) times daily.    Take 1 tablet (0.5 mg total) by mouth 2 (two) times daily.  Discontinued Medications   BUDESONIDE-FORMOTEROL (SYMBICORT) 160-4.5 MCG/ACT INHALER    Inhale 2 puffs into the lungs 2 (two) times daily.

## 2017-11-27 DIAGNOSIS — M25512 Pain in left shoulder: Secondary | ICD-10-CM | POA: Diagnosis not present

## 2017-11-28 DIAGNOSIS — E782 Mixed hyperlipidemia: Secondary | ICD-10-CM | POA: Diagnosis not present

## 2017-11-28 DIAGNOSIS — J441 Chronic obstructive pulmonary disease with (acute) exacerbation: Secondary | ICD-10-CM | POA: Diagnosis not present

## 2017-11-28 DIAGNOSIS — I10 Essential (primary) hypertension: Secondary | ICD-10-CM | POA: Diagnosis not present

## 2017-11-28 DIAGNOSIS — J9611 Chronic respiratory failure with hypoxia: Secondary | ICD-10-CM | POA: Diagnosis not present

## 2017-11-28 DIAGNOSIS — S76012A Strain of muscle, fascia and tendon of left hip, initial encounter: Secondary | ICD-10-CM | POA: Diagnosis not present

## 2017-11-28 DIAGNOSIS — G2581 Restless legs syndrome: Secondary | ICD-10-CM | POA: Diagnosis not present

## 2017-11-28 DIAGNOSIS — J449 Chronic obstructive pulmonary disease, unspecified: Secondary | ICD-10-CM | POA: Diagnosis not present

## 2017-11-28 DIAGNOSIS — G4733 Obstructive sleep apnea (adult) (pediatric): Secondary | ICD-10-CM | POA: Diagnosis not present

## 2017-12-10 DIAGNOSIS — J449 Chronic obstructive pulmonary disease, unspecified: Secondary | ICD-10-CM | POA: Diagnosis not present

## 2017-12-10 DIAGNOSIS — J439 Emphysema, unspecified: Secondary | ICD-10-CM | POA: Diagnosis not present

## 2017-12-10 DIAGNOSIS — G4733 Obstructive sleep apnea (adult) (pediatric): Secondary | ICD-10-CM | POA: Diagnosis not present

## 2017-12-10 DIAGNOSIS — M05741 Rheumatoid arthritis with rheumatoid factor of right hand without organ or systems involvement: Secondary | ICD-10-CM | POA: Diagnosis not present

## 2017-12-10 DIAGNOSIS — J849 Interstitial pulmonary disease, unspecified: Secondary | ICD-10-CM | POA: Diagnosis not present

## 2017-12-10 DIAGNOSIS — M05742 Rheumatoid arthritis with rheumatoid factor of left hand without organ or systems involvement: Secondary | ICD-10-CM | POA: Diagnosis not present

## 2017-12-10 DIAGNOSIS — R0603 Acute respiratory distress: Secondary | ICD-10-CM | POA: Diagnosis not present

## 2017-12-12 DIAGNOSIS — M05742 Rheumatoid arthritis with rheumatoid factor of left hand without organ or systems involvement: Secondary | ICD-10-CM | POA: Diagnosis not present

## 2017-12-12 DIAGNOSIS — J439 Emphysema, unspecified: Secondary | ICD-10-CM | POA: Diagnosis not present

## 2017-12-12 DIAGNOSIS — M05741 Rheumatoid arthritis with rheumatoid factor of right hand without organ or systems involvement: Secondary | ICD-10-CM | POA: Diagnosis not present

## 2017-12-12 DIAGNOSIS — R0603 Acute respiratory distress: Secondary | ICD-10-CM | POA: Diagnosis not present

## 2017-12-12 DIAGNOSIS — G4733 Obstructive sleep apnea (adult) (pediatric): Secondary | ICD-10-CM | POA: Diagnosis not present

## 2017-12-12 DIAGNOSIS — J449 Chronic obstructive pulmonary disease, unspecified: Secondary | ICD-10-CM | POA: Diagnosis not present

## 2017-12-12 DIAGNOSIS — J849 Interstitial pulmonary disease, unspecified: Secondary | ICD-10-CM | POA: Diagnosis not present

## 2017-12-31 DIAGNOSIS — J439 Emphysema, unspecified: Secondary | ICD-10-CM | POA: Diagnosis not present

## 2017-12-31 DIAGNOSIS — R0603 Acute respiratory distress: Secondary | ICD-10-CM | POA: Diagnosis not present

## 2017-12-31 DIAGNOSIS — G4733 Obstructive sleep apnea (adult) (pediatric): Secondary | ICD-10-CM | POA: Diagnosis not present

## 2017-12-31 DIAGNOSIS — M05741 Rheumatoid arthritis with rheumatoid factor of right hand without organ or systems involvement: Secondary | ICD-10-CM | POA: Diagnosis not present

## 2017-12-31 DIAGNOSIS — M05742 Rheumatoid arthritis with rheumatoid factor of left hand without organ or systems involvement: Secondary | ICD-10-CM | POA: Diagnosis not present

## 2017-12-31 DIAGNOSIS — J449 Chronic obstructive pulmonary disease, unspecified: Secondary | ICD-10-CM | POA: Diagnosis not present

## 2017-12-31 DIAGNOSIS — J849 Interstitial pulmonary disease, unspecified: Secondary | ICD-10-CM | POA: Diagnosis not present

## 2018-01-10 DIAGNOSIS — M05741 Rheumatoid arthritis with rheumatoid factor of right hand without organ or systems involvement: Secondary | ICD-10-CM | POA: Diagnosis not present

## 2018-01-10 DIAGNOSIS — G4733 Obstructive sleep apnea (adult) (pediatric): Secondary | ICD-10-CM | POA: Diagnosis not present

## 2018-01-10 DIAGNOSIS — J439 Emphysema, unspecified: Secondary | ICD-10-CM | POA: Diagnosis not present

## 2018-01-10 DIAGNOSIS — M05742 Rheumatoid arthritis with rheumatoid factor of left hand without organ or systems involvement: Secondary | ICD-10-CM | POA: Diagnosis not present

## 2018-01-10 DIAGNOSIS — J449 Chronic obstructive pulmonary disease, unspecified: Secondary | ICD-10-CM | POA: Diagnosis not present

## 2018-01-10 DIAGNOSIS — R0603 Acute respiratory distress: Secondary | ICD-10-CM | POA: Diagnosis not present

## 2018-01-10 DIAGNOSIS — J849 Interstitial pulmonary disease, unspecified: Secondary | ICD-10-CM | POA: Diagnosis not present

## 2018-01-12 DIAGNOSIS — J849 Interstitial pulmonary disease, unspecified: Secondary | ICD-10-CM | POA: Diagnosis not present

## 2018-01-12 DIAGNOSIS — R0603 Acute respiratory distress: Secondary | ICD-10-CM | POA: Diagnosis not present

## 2018-01-12 DIAGNOSIS — M05741 Rheumatoid arthritis with rheumatoid factor of right hand without organ or systems involvement: Secondary | ICD-10-CM | POA: Diagnosis not present

## 2018-01-12 DIAGNOSIS — J449 Chronic obstructive pulmonary disease, unspecified: Secondary | ICD-10-CM | POA: Diagnosis not present

## 2018-01-12 DIAGNOSIS — J439 Emphysema, unspecified: Secondary | ICD-10-CM | POA: Diagnosis not present

## 2018-01-12 DIAGNOSIS — G4733 Obstructive sleep apnea (adult) (pediatric): Secondary | ICD-10-CM | POA: Diagnosis not present

## 2018-01-12 DIAGNOSIS — M05742 Rheumatoid arthritis with rheumatoid factor of left hand without organ or systems involvement: Secondary | ICD-10-CM | POA: Diagnosis not present

## 2018-01-14 DIAGNOSIS — J849 Interstitial pulmonary disease, unspecified: Secondary | ICD-10-CM | POA: Diagnosis not present

## 2018-01-14 DIAGNOSIS — M2550 Pain in unspecified joint: Secondary | ICD-10-CM | POA: Diagnosis not present

## 2018-01-14 DIAGNOSIS — M0579 Rheumatoid arthritis with rheumatoid factor of multiple sites without organ or systems involvement: Secondary | ICD-10-CM | POA: Diagnosis not present

## 2018-01-14 DIAGNOSIS — Z6841 Body Mass Index (BMI) 40.0 and over, adult: Secondary | ICD-10-CM | POA: Diagnosis not present

## 2018-01-14 DIAGNOSIS — Z79899 Other long term (current) drug therapy: Secondary | ICD-10-CM | POA: Diagnosis not present

## 2018-01-16 DIAGNOSIS — Z961 Presence of intraocular lens: Secondary | ICD-10-CM | POA: Diagnosis not present

## 2018-01-30 ENCOUNTER — Other Ambulatory Visit: Payer: Self-pay | Admitting: Geriatric Medicine

## 2018-01-30 DIAGNOSIS — Z1231 Encounter for screening mammogram for malignant neoplasm of breast: Secondary | ICD-10-CM

## 2018-02-09 DIAGNOSIS — J439 Emphysema, unspecified: Secondary | ICD-10-CM | POA: Diagnosis not present

## 2018-02-09 DIAGNOSIS — M05742 Rheumatoid arthritis with rheumatoid factor of left hand without organ or systems involvement: Secondary | ICD-10-CM | POA: Diagnosis not present

## 2018-02-09 DIAGNOSIS — G4733 Obstructive sleep apnea (adult) (pediatric): Secondary | ICD-10-CM | POA: Diagnosis not present

## 2018-02-09 DIAGNOSIS — M05741 Rheumatoid arthritis with rheumatoid factor of right hand without organ or systems involvement: Secondary | ICD-10-CM | POA: Diagnosis not present

## 2018-02-09 DIAGNOSIS — J449 Chronic obstructive pulmonary disease, unspecified: Secondary | ICD-10-CM | POA: Diagnosis not present

## 2018-02-09 DIAGNOSIS — J849 Interstitial pulmonary disease, unspecified: Secondary | ICD-10-CM | POA: Diagnosis not present

## 2018-02-09 DIAGNOSIS — R0603 Acute respiratory distress: Secondary | ICD-10-CM | POA: Diagnosis not present

## 2018-02-11 DIAGNOSIS — J849 Interstitial pulmonary disease, unspecified: Secondary | ICD-10-CM | POA: Diagnosis not present

## 2018-02-11 DIAGNOSIS — R0603 Acute respiratory distress: Secondary | ICD-10-CM | POA: Diagnosis not present

## 2018-02-11 DIAGNOSIS — M05742 Rheumatoid arthritis with rheumatoid factor of left hand without organ or systems involvement: Secondary | ICD-10-CM | POA: Diagnosis not present

## 2018-02-11 DIAGNOSIS — J449 Chronic obstructive pulmonary disease, unspecified: Secondary | ICD-10-CM | POA: Diagnosis not present

## 2018-02-11 DIAGNOSIS — G4733 Obstructive sleep apnea (adult) (pediatric): Secondary | ICD-10-CM | POA: Diagnosis not present

## 2018-02-11 DIAGNOSIS — M05741 Rheumatoid arthritis with rheumatoid factor of right hand without organ or systems involvement: Secondary | ICD-10-CM | POA: Diagnosis not present

## 2018-02-11 DIAGNOSIS — J439 Emphysema, unspecified: Secondary | ICD-10-CM | POA: Diagnosis not present

## 2018-02-17 DIAGNOSIS — J441 Chronic obstructive pulmonary disease with (acute) exacerbation: Secondary | ICD-10-CM | POA: Diagnosis not present

## 2018-02-17 DIAGNOSIS — I10 Essential (primary) hypertension: Secondary | ICD-10-CM | POA: Diagnosis not present

## 2018-02-17 DIAGNOSIS — J9611 Chronic respiratory failure with hypoxia: Secondary | ICD-10-CM | POA: Diagnosis not present

## 2018-02-17 DIAGNOSIS — I872 Venous insufficiency (chronic) (peripheral): Secondary | ICD-10-CM | POA: Diagnosis not present

## 2018-02-17 DIAGNOSIS — Z6841 Body Mass Index (BMI) 40.0 and over, adult: Secondary | ICD-10-CM | POA: Diagnosis not present

## 2018-02-17 DIAGNOSIS — J0100 Acute maxillary sinusitis, unspecified: Secondary | ICD-10-CM | POA: Diagnosis not present

## 2018-02-17 DIAGNOSIS — G4733 Obstructive sleep apnea (adult) (pediatric): Secondary | ICD-10-CM | POA: Diagnosis not present

## 2018-02-24 ENCOUNTER — Ambulatory Visit (INDEPENDENT_AMBULATORY_CARE_PROVIDER_SITE_OTHER): Payer: PPO

## 2018-02-24 ENCOUNTER — Ambulatory Visit (INDEPENDENT_AMBULATORY_CARE_PROVIDER_SITE_OTHER): Payer: PPO | Admitting: Pulmonary Disease

## 2018-02-24 ENCOUNTER — Encounter: Payer: Self-pay | Admitting: Pulmonary Disease

## 2018-02-24 VITALS — BP 136/78 | HR 112 | Temp 98.4°F | Ht 67.0 in | Wt 274.6 lb

## 2018-02-24 DIAGNOSIS — J849 Interstitial pulmonary disease, unspecified: Secondary | ICD-10-CM

## 2018-02-24 DIAGNOSIS — J449 Chronic obstructive pulmonary disease, unspecified: Secondary | ICD-10-CM | POA: Diagnosis not present

## 2018-02-24 DIAGNOSIS — R0600 Dyspnea, unspecified: Secondary | ICD-10-CM

## 2018-02-24 DIAGNOSIS — M0579 Rheumatoid arthritis with rheumatoid factor of multiple sites without organ or systems involvement: Secondary | ICD-10-CM

## 2018-02-24 DIAGNOSIS — R05 Cough: Secondary | ICD-10-CM

## 2018-02-24 DIAGNOSIS — R059 Cough, unspecified: Secondary | ICD-10-CM

## 2018-02-24 MED ORDER — MECLIZINE HCL 25 MG PO TABS: ORAL_TABLET | ORAL | 2 refills | Status: DC

## 2018-02-24 MED ORDER — LEVOFLOXACIN 500 MG PO TABS: 500.0000 mg | ORAL_TABLET | Freq: Every day | ORAL | 2 refills | Status: DC

## 2018-02-24 MED ORDER — METHYLPREDNISOLONE ACETATE 80 MG/ML IJ SUSP
120.0000 mg | Freq: Once | INTRAMUSCULAR | Status: AC
  Administered 2018-02-24: 120 mg via INTRAMUSCULAR

## 2018-02-24 MED ORDER — TIOTROPIUM BROMIDE MONOHYDRATE 1.25 MCG/ACT IN AERS: 2.0000 | INHALATION_SPRAY | Freq: Every day | RESPIRATORY_TRACT | 0 refills | Status: DC

## 2018-02-24 MED ORDER — BUDESONIDE-FORMOTEROL FUMARATE 160-4.5 MCG/ACT IN AERO: 2.0000 | INHALATION_SPRAY | Freq: Two times a day (BID) | RESPIRATORY_TRACT | 0 refills | Status: DC

## 2018-02-24 NOTE — Patient Instructions (Addendum)
Today we updated your med list in our EPIC system...    Continue your current medications the same...  We gave you a Depo shot today... We wrote for Levaquin 500mg  #7 to keep on hand for sinus infection or bronchitis... We wrote a new prescription for Antivert (Meclizine) 25mg  tabs to try 1/2 to 1 tab every 6h as needed for dizziness...  We will arrang e for a follow up appt w/ Dr. Rodman Pickle in 3-4 months...  [name]-- it has been my great honor t have been one of your doctors over these last few years...   Sending you & your family my very best wishes for good health & much happiness in the years to come!!!

## 2018-02-24 NOTE — Addendum Note (Signed)
Addended by: Noralee Space on: 02/24/2018 07:05 AM   Modules accepted: Level of Service

## 2018-02-24 NOTE — Progress Notes (Signed)
Subjective:     Patient ID: [name], female   DOB: [birth year], 77 y.o.   MRN: [id number]  HPI    77 y/o WF whose PCP is DrStoneking and followed by Rheum-DrHawkes w/ seropos RA, underlying ILD, and superimposed mixed COPD (emphysema and chr bronchitis) + exercise induced hypoxemia, & DJD; on Arava & low dose Pred plus formal PT, Xopenex NEBS Tid, Symbicort160-2spBid, Spiriva daily, & started on Home O2 (11/16) at 2L/min w/ exercise...  ~  SEE PREV EPIC NOTES FOR OLDER DATA>  ~  March 29, 2011:  Initial pulmonary consult w/ KC>        The patient is a 77year-old female who I've been asked to see for ongoing pulmonary issues. She has been given the diagnosis of COPD years ago, but also tells me that she was diagnosed with asthma in her late 63s and put on a nebulizer. The patient thinks that her breathing is worsening, and she has had 3-4 episodes this year that have been labeled as "acute exacerbation" her last flareup was the end of November, and she was treated with antibiotics and prednisone. The patient notes a one block dyspnea on exertion at a moderate pace on flat ground. She will also get winded bringing groceries in from the car, and doing any kind of light housework. She notes very significant reflux symptoms, despite being on a proton pump inhibitor. She notes a barking cough at night and also first thing in the morning upon arising. The patient currently is on Symbicort, and has taken this compliantly. She has had no recent chest x-ray or full pulmonary function studies. Of note, her weight is neutral over the last one year.       PLAN>> I suspect, with the patient's long history of tobacco abuse, that she probably does have some degree of chronic airflow obstruction. It is unclear how much asthma may be playing a role here. She has had multiple episodes this year that required antibiotics and prednisone, and I wonder how much of this is related to her underlying lung issues or  possibly laryngopharyngeal reflux. She is having persistent reflux symptoms despite taking a proton pump inhibitor. She also describes cyclical coughing as well, which is typically an upper airway issue. I would like to check a chest x-ray today, and schedule her for full pulmonary function studies. I would also like to treat her reflux more aggressively over the next 3-4 weeks. We'll also add Spiriva to her Symbicort to see if this helps   CXR 03/29/11 showed borderline heart size, ectasia of thoracic Ao, low flat diaphragms, essentially clear lungs, osteopenia & degen spondylosis in spine...  FullPFTs 05/17/11 showed FVC=3.11 (99%), FEV1=1.67 (74%), %1sec=54, mid-flows reduced at 18% predicted; no improvement in FEV1 post bronchodil; TLC=4.52 (84%), RV=1.41 (65%), RV/TLC=31%; DLCO=50% predicted.=> c/w mod airflow obstruction, borderline lung volumes, moderate decr in diffusion capacity...    CT Chest 07/12/13 per DrStoneking showed norm heart size, atherosclerotic changes in Ao & coronaries, no adenopathy; biapical pleuroparenchymal scarring & emphysema, scattered subpleural nodules- 32mm LUL, 66mm RUL, 51mm RML, 18mm RLL...   CT Chest 02/02/14 per DrStoneking showed norm heart size, calcif atherosclerotic Ao & coronaries, no adenopathy, centrilob & paraseptal emphysema noted, mult small nonspecific pulm nodules- 70mm in LUL, 107mm in lingula, 51mm in RUL, 23mm in RML and no new or enlarging pulm lesions (stable pulm nodules)...  ~  February 06, 2015:  Initial pulm eval by SN>  Pt referred by DrStoneking for progressive  dyspnea and abnormal CT Chest... The patient's granddaughter is DrStoneking's nurse.      Pt presents very concerned about her pulmonary situation having recently had a 3rd CT Chest & was told that they detected more & bigger pulmonary nodules & she is scared that she might have cancer; she brought a disc w/ CT Chest done 01/26/15 at [facility] & on my review the scan looks different than  prev- now w/ widespread patchy ground glass opac superimposed on her prev emphysema changes & mult small pulm nodules (now largely obscured by this interstitial process); I am concerned this new ILD may represent COP (cryptogenic organizing pneumonia- the new terminology for our prev BOOP diagnosis)... She is c/o cough, small amt beige sput w/o hemoptysis, notes SOB/DOE w/ exertion (she used to swim a lot but recently has been more sedentary- esp since knee surg 10/2014); notes SOB w/ housework x61mo & stress makes the SOB worse, been using her Symbicort & Spiriva just "as needed" she says; DrStoneking got her a nebulizer w/ Xopenex to use TID but she has run out of this med...       In the course of our conversations Kiki tells me that she was prev diagnosed w/ Lupus by DrGates and DrLevitin years ago (1997) but we do not have any old data from this period (note from [place] 02/01/15 just mentioned lung nodules and PMHx has no mention of SLE, prev abn blood tests, prev Rheum eval, etc);  The pt volunteers that she had a pos ANA & was treated by DrLevitin w/ Pred intermittently over a 3 yr period;  ?why she stopped going & it was never mentioned again... She does admit to hx arthritis, hurting all over (esp knees), and she had arthroscopic knee surg by [facility] 10/26/14 "he scraped it out"; she had post op therapy  But she noted her breathing was worse...   Smoking Hx>  she is an ex-smoker, starting at age 3 & smoked for about 50 yrs up to 1.5ppd, & quit in 2003; est ~50-60 pack-yr smoking hx...  Pulmonary Hx>  She reports hx diphtheria as child, had lots of recurrent bronchitic infections in her adult life, she reports several bouts of pneumonia & wonders if these infections left her w/ scar tissue, seen by DrClance 2013 w/ remote hx asthma/ now modCOPD treated w/ Symbicort, Spriva, PPI & antireflux regimen;  ?she had sleep eval 2013 & was started on CPAP (?[facility], no records avail)...  Medical  Hx>  DrStoneking- HL, Obesity, GERD, Divertics, colon polyps, liver AVM, osteopenia, migraines, RLS, anxiety/depression...  Family Hx>  Hx asthma in her mother, otherw no FamHx lung problems...   Occup Hx>  No known occupational exposures- asbestos, chemical toxins, no recent travel or birds...  Current Meds>  Symbicort160, Spiriva, Xopenex neb, Mucinex, Claritin10, ASA81, Lisinopril5, Fish Oil, Prilosec20-2Bid, Celexa40, calcium/ vitamins... EXAM - Afeb, VSS, O2sat=92% on RA;  Obese at 251#, 5'7"Tall, BMI=39;  HEENT- neg, mallampati2;  Chest- decr BS bilat, few basilar rales, no wheezing or consolidation;  Heart- RR gr1-2 SEM w/o r/g;  Abd- obese soft neg;  Ext- w/o c/c/e  CT Chest 01/26/15 by [facility]- report by Dr. Anabel Halon- indicates extensive bilat pulm fibrosis, mult pulm masses & nodules- neoplasia cannot be excluded, mult nodes in mediastinum- mostly wnl in size... To my review this shows widespread patchy ground glass opac superimposed on her prev emphysema changes & mult small pulm nodules (now largely obscured by this interstitial process);  I am concerned this new ILD may represent COP.   CXR 02/06/15 showed norm heart size, lungs appear hyperinflated w/ coarsened interstitial markings bilat & an opacity at left lung base (all this new compared to last CXR 2012)...  Spirometry 02/06/15 showed FVC=1.97 (62%), FEV1=1.40 (60%), %1sec=71%, mid-flows are reduced at 49% predicted;  This is suggestive of mixed mild obstructive & mod restrictive dis...  Ambulatory oxygen saturation test 01/2015> O2sat=92% on RA at rest w/ pulse=95/min;  She ambulated in office only 1 lap w/ lowest O2sat=88% w/ pulse=124/min...  LABS 01/2015>  Chems- wnl;  CBC- wnl x WBC=19.5;  TSH=6.41;  Collagen-vasc screen:  ACE=10 (nl<52), RheumFactor=100 (Hi pos >42), Anti-CCP=213 (strong pos >59), ANA pos 1:320 homogeneous pattern, ANCA screen= NEG (MPO & PR-3), Sed=54, CRP=4.5.Marland KitchenMarland Kitchen IMP/PLAN >>       New ILD per  CXR & CT Chest- c/w COP/BOOP>  The totality of the eval including labs looks like this could be Rheumatoid lung dis; we will refer her to [facility] ASAP & start Pred rx...      Underlying COPD/emphysema, former smoker>  She is on Symbicort160-2spBid, Spiriva daily, Xopenex nebs Tid, Mucinex and rec to take meds regularly, NOT prn...      Hx mult small pulmonary nodules seen on prev CT scans in 2013>  This will need to be followed w/ serial CXR/ CT scans...      OSA- eval & Rx per [facility], no data avail in Epic>  Per DrStoneking & Maxwell Caul...      GERD/ prob LPR>  She needs a vigorous antireflux regimen w/ PPI Bid, NPO after dinner, elev HOB 6"; DrMJohnson is her GI...      Abn collagen-vasc screen w/ +RA, +Anti-CCP, +ANA, Sed54>  She was prev treated by DrLevitin for Lupus 1997-2000, labs look like RA & we will refer pt to [facility] for Rheum eval at this time...  ~  March 14, 2015:  4mo ROV w/ SN>        At the time of our initial visit we started PRED40 x2wks=> 20 x2wks w/ f/u appt 65mo;  She saw [facility] for Rheum in the interval but we don't have note from her to review;  Pt tells me that she has Hx OA w/ prev knee arthroscopy by DrCaffrey & presist w/ lots of knee pain, but she claims that while she was taking the Pred20 she suddenly developed severe pain & swelling in her hands, wrists and ankles=> called DrHawkes who wanted to get her off the Pred & weaned her down further to 10mg /d- recall that we started the Pred for prob Rheumatoid Lung w/ CT scan suggesting COP/BOOP pattern;  She further indicates to me that she fell out of bed 2wks ago- bruised leg & arm notes it's hard to walk w/ pain in feet & legs;  She called DStoneking's office & was switched from Aleve to Tylenol;  It got so bad she went to the ER yest=> given Tramadol & told to f/u here;  She has a follow up appt w/ [facility] tomorrow 03/15/15...      From the pulmonary standpoint she is about the same, but the pain in her hands/  wrist has been her main complaint for the last week or so;  She remains on Symbicort160-2spBid, Spiriva daily, NEBS w/ Xopenex tid, Mucinex prn;  She has had trouble doing the inhalers since her hands &wrist swelled up, pain w/ actuation;  She is SOB & O2sat=90% on RA today in office;  Notes sl dry cough, no sput, no hemoptysis, no CP...       EXAM shows Afeb, VSS, O2sat=90% on RA;  HEENT- neg, mallampati2;  Chest- decr BS bilat, few basilar rales, no wheezing or consolidation;  Heart- RR gr1-2 SEM w/o r/g;  Abd- obese soft neg;  Ext- swollen/ tender wrists & hands!  CXR 03/13/15 in ER> mild cardiomeg, COPD/ hyperinflation, prominent lung markings bilat, no acute changes...   EKG 03/13/15 in ER> sinus tachy, rate 106/min, NSSTTWA, NAD...  Ambulatory O2sat test 03/14/15> O2sat=91% on RA at rest w/ pulse=86/min; on standing w/ min walk O2sat dropped to 85% w/ pulse=106/min... IMP >>      New ILD per CXR & CT Chest- c/w COP/BOOP, prob related to Reumatoid Lung>  We started Pred 40=>20 but this was further cut to 10mg /d by [facility] in the interval; pulm status is clearly no better, ?sl worse, and she needs HOME O2 to be started rec 1-2L/min rest & 2L/min exercise => hypoxemic resp failure...      Underlying COPD/emphysema, former smoker>  She is on Symbicort160-2spBid, Spiriva daily, Xopenex nebs Tid, Mucinex and rec to take meds regularly everyday...      Hx mult small pulmonary nodules seen on prev CT scans in 2015> recent CT 01/2015 showed these nodules were masked by the ground glass opac & interstitial process; this will need to be followed w/ serial CXR/ CT scans...      OSA- eval & Rx per [facility], no data avail in Epic>  Per DrStoneking & Maxwell Caul...      GERD/ prob LPR>  She needs a vigorous antireflux regimen w/ PPI Bid, NPO after dinner, elev HOB 6"; DrMJohnson is her GI...      Abn collagen-vasc screen w/ +RA, +Anti-CCP, +ANA, Sed54> Looks like RA-  She was prev treated by DrLevitin for  Lupus 1997-2000;  She was referred to [facility] for Rheum & her note is pending... PLAN >>       She is asked to keep the Pred at 10mg  per day for now due to her pulmonary process;  She has ROV tomorrow w/ Rheum for follow up & to eval her acute hand, wrist, foot arthritic process;  We are starting HOME O2 due to her acute hypoxemic resp failure... We will follow up in 54month, sooner if needed. ADDENDUM>> Pt seen by [facility] 11/30-- seropos RA, OA of both knees, ILD;  Insurance won't cover TNFs therefore started on ARAVA20 and Pred increased to 20mg /d... ADDENDUM>> Full PFTs 03/30/15:  FVC=2.23 (73%), FEV1=1.50 (65%), %1sec=68, mid-flows reduced at 51% predicted; post bronchodil the FEV1 improved 6%; TLC=4.13 (77%), RV=1.80 (75%), RV/TLC=44; DLCO=50% predicted;  These PFTs are compatible w/ mild restriction and a superimposed mild obstructive defect w/ a mod-severe reduction is DLCO...  ~  May 24, 2015:  58mo ROV w/ SN>  Brennan notes that he breathing is improved w/ her home O2 therapy but she would like an Inogen One POC & we will request this from her DME;  She notes SOB/DOE stable- no progression, and mild dry cough w/o sput, no CP etc... She has remained on Pred per [facility], no recent notes scanned into Epic; on Pred20/d + Arava and she is checked Q59mo w/ slow Pred taper...      ILD/ pulm fibrosis per CXR & CT Chest- c/w COP/BOOP, prob related to Reumatoid Lung>  On Pred & Arava per DrHawkes, Rheum w/ freq OVs and slow taper of the Pred.Marland KitchenMarland Kitchen  Underlying COPD/emphysema, former smoker>  on Symbicort160-2spBid, Spiriva daily, Xopenex nebs Tid, Mucinex and rec to take meds regularly everyday...      Hypoxemic resp failure>  On Home O2 at 2L/min w/ O2sat=97% today in office; she is requesting a smaller POC- wants the Inogen One...      Hx mult small pulmonary nodules seen on prev CT scans in 2015> recent CT 01/2015 at [place] ([facility]) showed these nodules were masked by the ground glass opac &  interstitial process; CXR 11/16 w/ incr interstitial markings.      OSA- eval & Rx per [facility], no data avail in Epic>  per DrStoneking & Maxwell Caul...      GERD/ prob LPR>  She needs a vigorous antireflux regimen w/ PPI (Prilosec40) Bid, NPO after dinner, elev HOB 6"; DrMJohnson is her GI...      Abn collagen-vasc screen w/ +RA, +Anti-CCP, +ANA, Sed54> Looks like RA-  She was prev treated by DrLevitin for Lupus 1997-2000;  She saw [facility] 02/2015 & started on ARAVA while slowly weaning Pred. EXAM shows Afeb, VSS, O2sat=97% on 2L/min pulse dose;  Wt=267#, 5'7"Tall, BMI=40;  HEENT- neg, mallampati2;  Chest- decr BS bilat, few basilar rales, no wheezing or consolidation;  Heart- RR gr1-2 SEM w/o r/g;  Abd- obese soft neg;  Ext- swollen/ sl tender wrists & hands! IMP/PLAN>>  She is slowly weaning the Pred per Rheum & taking the Arava20;  We will request a POC device for her;  We plan ROV in 79mo w/ f/u CXR/ CT Chest...  ~  July 24, 2015:  41mo ROV and Melvie notes that her breathing is stable> she is an ex-smoker w/ mixed obstructive (COPD/emphysema) & restrictive (ILD w/ prob RA and obesity) lung dis, chronic hypoxemic resp failure on HomeO2;  She has been using her Symbocort160-2spBid, Spiriva daily, Mucinex600- one tab daily; she has not been using her nebulizer and notes that her breathing is good;  She is followed by DrHawkes Q40mo on Arava & slow Pred taper- currently on 10mg /d...      ILD/ pulm fibrosis per CXR & CT Chest- c/w COP/BOOP, prob related to Reumatoid Lung>  On Pred (currently 10mg /d) & Arava per DrHawkes, Rheum w/ freq OVs and slow taper of the Pred...      Underlying COPD/emphysema, former smoker>  on Symbicort160-2spBid, Spiriva daily, Xopenex nebs Tid (only using this prn), Mucinex and rec to take meds regularly everyday...      HEx-induced hypoxemic resp failure>  On Home O2 at 2L/min w/ O2sat=97% on 2L in office today on her new "simply go" POC...       Hx mult small pulmonary  nodules seen on prev CT scans in 2015> CT 01/2015 at [place] ([facility]) showed these nodules were masked by the ground glass opac & interstitial process; CXR 11/16 w/ incr interstitial markings.      OSA- eval & Rx per [facility], no data avail in Epic>  per DrStoneking & Maxwell Caul...      GERD/ prob LPR>  She needs a vigorous antireflux regimen w/ PPI (Prilosec40) Bid, NPO after dinner, elev HOB 6"; DrMJohnson is her GI...      Abn collagen-vasc screen w/ +RA, +Anti-CCP, +ANA, Sed54> Looks like RA-  She was prev treated by DrLevitin for Lupus 1997-2000;  She saw [facility] 02/2015 & started on ARAVA while slowly weaning Pred. EXAM shows Afeb, VSS, O2sat=97% on 2L/min pulse dose;  Wt=267#, 5'7"Tall, BMI=42;  HEENT- neg, mallampati2;  Chest- decr BS bilat,  few basilar rales, no wheezing or consolidation;  Heart- RR gr1-2 SEM w/o r/g;  Abd- obese soft neg;  Ext- much improved wrists & hands  CT Chest => done 07/28/15> norm heart size, atherosclerotic changes in Ao & LAD, mod centrilobular & paraseptal emphysema bilaterally, mult pulmonary nodules are unchanged from 06/2013 scan and felt c/w benign process, NO MENTION MADE OF INTERSTITIAL PROCESS SEEN ON CT CHEST 01/26/15 from [facility] => now largely resolved...    IMP/PLAN>>  Ceara's breathing is stable, at her new baseline & we will proceed w/ new CT Chest to compare to the old; slow Pred taper per rheum and she desperately needs to get on diet & get weight down; continue O2, Symbicort, Spiriva, etc...  ~  January 22, 2016:  62mo ROV w/ SN>  Aleisha reports an acute exac of her COPD starting several days ago w/ cough, greenish sput production, mod chest congestion, no hemoptysis, sl incr SOB but no CP/ palpait/ edema; she remains on Home O2 at 1-2L/min, NEBS w/ Xopenex Tid, Symbicort160-2spBid, Spiriva daily; Her Pred was boosted to 12.5mg /d in August by [facility] for an RA flair at that time & she continues on the [place] daily;  We discussed treatment for  this AB exac w/ Levaquin + probiotic...       ILD/ pulm fibrosis per CXRs & prev CT Chest- c/w COP/BOOP, prob related to Reumatoid Lung>  On Pred (currently 12.5mg /d per DrHawkes) & Arava, Rheum w/ freq OVs and slow taper of the Pred planned...      Underlying mixed COPD w/ emphysema & chr bronchitis, former smoker>  on Symbicort160-2spBid, Spiriva daily, Xopenex nebs Tid (only using this prn), Mucinex and rec to take meds regularly everyday...      Hx ex-induced hypoxemic resp failure>  On Home O2 at 2L/min w/ O2sat=97% on 2L in office today on her new "simply go" POC...       Hx mult small pulmonary nodules seen on prev CT scans in 2015> CT 01/2015 at [place] ([facility]) showed these nodules were masked by the ground glass opac & interstitial process; CXR 11/16 w/ incr interstitial markings; f/u CT 07/2015 showed stable pulm nodules and resolution of the interstitial process prev seen...      OSA- eval & Rx per [facility], no data avail in Epic>  per DrStoneking & Maxwell Caul...      Obesity>  Dillan weighs ~270#, 5'7"Tall, BMI=42; she has been counseled on DIET/ EXERCISE/ wt reducing strategies...      GERD/ prob LPR>  She needs a vigorous antireflux regimen w/ PPI (Prilosec40) Bid, NPO after dinner, elev HOB 6"; DrMJohnson is her GI...      Abn collagen-vasc screen w/ +RA, +Anti-CCP, +ANA, Sed54> +RA-  She was prev treated by DrLevitin for Lupus 1997-2000;  She saw [facility] 02/2015 & started on ARAVA while slowly weaning Pred. EXAM shows Afeb, VSS, O2sat=100% on 1L/min pulse dose;  Wt=268#, 5'7"Tall, BMI=40;  HEENT- neg, mallampati2;  Chest- decr BS bilat, few basilar rales, no wheezing or consolidation;  Heart- RR gr1-2 SEM w/o r/g;  Abd- obese soft neg;  Ext- much improved wrists & hands IMP/PLAN>>  I placed Lakenya on Levaquin for this bronchitic flair & rec that she take a probiotic like Align while on the antibiotic;  She will continue her O2, Prednisone, Symbicort, Spiriva, & Mucinex;  Reminded  about the recommended antireflux regimen;  Also reminded about diet/ exercise & wt reduction strategies...  ~  July 22, 2016:  1mo ROV & pulmonary recheck> Her PCP is DrStoneking, & Rheum- DrHawkes=> outpt rehab for difficullty walking...  She states her breathing is good, no change; notes incr SOB w/ activities but ADLs are still ok (eg-  Vacuums and takes her time, but stairs are bad)... On O2 at 1L/min rest & 2L/min activity; current Pred taper, Symbicort160-2spBid, Spiriva once daily,  XOPENEX NEBS but not using Tid- just does this Prn she says & "hasn't needed";  She has Mucinex & Claritin for as needed use too;  She remains on ARAVA per [facility]...    EXAM shows Afeb, VSS, O2sat=96% on 1L/min pulse dose;  Wt=276#, 5'7"Tall, BMI=43;  HEENT- neg, mallampati2;  Chest- decr BS bilat, few basilar rales, no wheezing or consolidation;  Heart- RR gr1-2 SEM w/o r/g;  Abd- obese soft neg;  Ext- much improved wrists & hands.  CXR 07/22/16>  Norm heart size, hyperinflation c/w COPD, mild chr interstitial thickening- no focal abn & NAD...  IMP/PLAN>>  Dashanae appears stable but must desperately lose the weight!  She requests a Rx for [facility] to keep on hand & we discussed this- must DIET/ EXERCISE/ get wt down! Continue same meds...  ~  November 12, 2016:  21mo ROV & Joelene called for an acute visit- add on appt due to a 4d hx CP & SOB, notes substernal CP that is incr w/ a deep breath/ movement/ & thru to her back; her SOB is a sensation of not getting a deep breath;  I note that her weight is down 9# to 267# today & she is watching her intake but still not exercising; notes mild cough, small amt clear sput, no hemoptysis, no f/c/s, no edema etc... She remains on Rx w/ O2 at 2L/min w/ activ & 1L/min rest; NEBs w/ Xopenex Tid; Symbicort160-2spBid & Spiriva once daily; Mucinex600 once daily; Levaquin for prn use;  She remains on Pred7.5mg /d, Plaquenil200Bid, & Arava per [facility] (last note 07/09/16);  She remains on  Alendronate70, Betances per DrStoneking, but off Lipitor due to leg cramps...  We discussed further eval w/ f/u CXR, EKG, ambulatory oximetry today>>    EXAM shows Afeb, VSS, O2sat=95% on 1L/min pulse dose;  Wt=267#, 5'7"Tall, BMI=42;  HEENT- neg, mallampati2;  Chest- decr BS bilat, few basilar rales, no wheezing or consolidation;  Heart- RR gr1-2 SEM w/o r/g;  Abd- obese soft neg;  Ext- much improved wrists & hands.  CXR 11/12/16 (independently reviewed by me in the PACS system) showed heart at upper lim of norm, hyperinflation c/w COPD, chr interstitial prominence (stable)- NAD.Marland KitchenMarland Kitchen   EKG 11/12/16 shows NSR, rate75, wnl...  Ambulatory Oximetry 11/12/16 on 1L/min showed O2sat=94% at rest w/ pulse=85/min;  She ambulated only 2 Laps (185'ea) on 1L/min w/ lowest O2sat=89% w/ heart rate= 95/min;  She could not go further due to leg fatigue...  IMP/PLAN>>  Chaselyn seems to have developed some CWP/ inflamm & has assoc SOB/DOE fom this & chest wall factors- her COPD, ?rheumatoid interstitial dis, etc- all appears stable;  She is rec to REST, apply HEAT, & use Tramadol50 + Tylenol for the pain;  She will continue her current breathing meds regularly & over the longer term must get on diet, incr exercise, & must lose weight... She will call for any worsening symptoms and f/u w/ [place] in 2-80mo...  ~  January 21, 2017:  28mo ROV & pulm f/u visit>  Linna reports a good interval- her CWP/inflamm resolved w/ the rest, heat, Tramadol; and her assoc  SOB has resolved; min cough, no sput at present; she tells me that she has stopped doing her NEBS w/ Xopenex (stopped on her own) but she has continued the Symbicort160-2spBid & Spiriva daily regularly;  She also has Home O2 & received a smaller simply Go Mini last year but now she tells me "I don't use it when I go outside" (supposed to be on 1L/min rest & 2L/min exercise);  In addition Tabathia has not lost any weight- still at 268# w/ BMI=42- we again reviewed the importanceof  using her O2 w/ exercise, getting on diet & getting her weight down!  We reviewed the following medical problems during today's office visit >>       ILD/ pulm fibrosis per CXRs & prev CT Chest- c/w COP/BOOP, prob related to Reumatoid Lung>  On Pred (currently 7.5mg /d per DrHawkes) + Plaquenil & Arava per Rheum w/ freq OVs and slow taper of the Pred planned.      Underlying mixed COPD w/ emphysema & chr bronchitis, former smoker>  on Symbicort160-2spBid, Spiriva daily, Xopenex nebs Tid (only using this prn), Mucinex and rec to take meds regularly everyday...      Hx ex-induced hypoxemic resp failure>  On Home O2 at 2L/min w/ O2sat=97% on 2L in office today on her new "simply go" POC...       Hx mult small pulmonary nodules seen on prev CT scans in 2015> CT 01/2015 at [place] ([facility]) showed these nodules were masked by the ground glass opac & interstitial process; CXR 11/16 w/ incr interstitial markings; f/u CT 07/2015 showed stable pulm nodules and resolution of the interstitial process prev seen...      OSA- eval & Rx per [facility], no data avail in Epic>  per DrStoneking & Maxwell Caul...      Obesity>  Elody weighs ~268#, 5'7"Tall, BMI=42; she has been counseled on DIET/ EXERCISE/ wt reducing strategies...      GERD/ prob LPR>  She needs a vigorous antireflux regimen w/ PPI (Prilosec40) Bid, NPO after dinner, elev HOB 6"; DrMJohnson is her GI...      Abn collagen-vasc screen w/ +RA, +Anti-CCP, +ANA, Sed54>  She was prev treated by DrLevitin for Lupus 1997-2000;  She saw [facility] 02/2015 & started on ARAVA while slowly weaning Pred. EXAM shows Afeb, VSS, O2sat=97% on 2L/min pulse dose;  Wt=268#, 5'7"Tall, BMI=40;  HEENT- neg, mallampati2;  Chest- decr BS bilat, few basilar rales, no wheezing or consolidation;  Heart- RR gr1-2 SEM w/o r/g;  Abd- obese soft neg;  Ext- much improved wrists & hands IMP/PLAN>>  Problems as noted and she is stable on current regimen- Pred/ Plaquenil/ Arava per Rheum and  O2, NEBS, Symbicort/ Spiriva, and Mucinex from [place];  We again reviewed the imperative to lose weight thru diet/ exercise/ etc...   ~  May 26, 2017:  50mo ROV and pulmonary follow up visit>  Galligan is a 77y/o F w/ hx seropos RA, underlying pulm fibrosis & mixed COPD (chr bronchitis & emphysema), ex-smoker w/ ~50 pack-yr hx & quit ~2003, hypoxemic resp failure on Home O2 at 2L/min, OSA on CPAP per [facility] [place], morbid obesity, sedentary lifestyle & deconditioning;  She is also quite anxious & this contributes another layer to her chr dyspnea...  She reports that she had an RA "flair-up" about 58mo ago & DrHawkes increased her Pred, continued the [place] & adjusted her Plaquenil;  She was also having some incr RLS symptoms and DrStoneking started her on Mirapex (we  do not have notes from these providers)...  We reviewed the following medical problems during today's office visit>        ILD/ pulm fibrosis per CXRs & prev CT Chest c/w COP/BOOP & prob underlying Reumatoid Lung>  On Pred (currently 7.5mg /d per DrHawkes) + Plaquenil & Arava per Rheum w/ freq OVs and slow taper of the Pred per Rheum... Last CXR 10/2016 showed heart at upper lim of norm, hyperinflation c/w COPD, chr interstitial prominence (stable)- NAD      Underlying mixed COPD w/ emphysema & chr bronchitis, former smoker quit 2003>  on Symbicort160-2spBid, Spiriva daily, Xopenex nebs Tid (only using this prn), Mucinex and rec to take meds regularly everyday...      Hx ex-induced hypoxemic resp failure>  On Home O2 at 2L/min w/ O2sat=96% on 1L in office today on her new "simply go" POC...       Hx mult small pulmonary nodules seen on prev CT scans in 2015> CT 01/2015 at [place] ([facility]) showed these nodules were masked by the ground glass opac & interstitial process; CXR 11/16 w/ incr interstitial markings; f/u CT 07/2015 showed stable pulm nodules and resolution of the interstitial process prev seen...      OSA- eval & Rx per  [facility], no data avail in Epic>  per DrStoneking & Maxwell Caul...      Obesity>  Malan weighs ~282# now, 5'7"Tall, BMI=44; she has been counseled on DIET/ EXERCISE/ wt reducing strategies & she understands that weight reduction is critical to her improving her breathing...      GERD/ prob LPR>  She needs a vigorous antireflux regimen w/ PPI (Prilosec40) Bid, NPO after dinner, elev HOB 6"; DrMJohnson is her GI...      Abn collagen-vasc screen w/ +RA, +Anti-CCP, +ANA, Sed54>  She was prev treated by DrLevitin for Lupus 1997-2000;  She saw [facility] 02/2015 & started on ARAVA while slowly weaning Pred. EXAM shows Afeb, VSS, O2sat=96% on 1L/min pulse dose;  Wt=282#, 5'7"Tall, BMI=44;  HEENT- neg, mallampati2;  Chest- decr BS bilat, few basilar rales, no wheezing or consolidation;  Heart- RR gr1-2 SEM w/o r/g;  Abd- obese soft neg;  Ext- much improved wrists & hands  NO RECENT LAB DATA TO REVIEW IN Epic> pt indicates that labs done by DrStoneking & DrHawkes IMP/PLAN>>  Coil has mult medical issues and a good bit of anxiety & stress;  Her RA & mild underlying ILD is controlled w/ Pred/ Arava/ Plaquenil from [facility];  Her mixed COPD is treated w/ NEBS, Symbicort, Spiriva, Mucinex;  Her hypoxemic resp failure w/ O2;  Her OSA w/ CPAP qhs from [place];  She is morbidly obese & desperately needs to lose some weight- we reviewed DIET, EXERCISE, wt reduction strategies & she should review this further w/ her PCP (they also monitor her labs), she is way too sedentary & deconditioned!  Finally she is quite anxious & under some stress- hopefully she will be able to de-stress her life, and that the [facility] 0.5mg  - 1/2 to 1 tab bid will also provide a measure of relief...  ~  November 24, 2017:  9mo ROV & pt had called [place] for new CPAP machine (current apparatus from 2003) but DrStoneking/ [facility] handles her OSA/ CPAP eval & Rx, we contacted their office & they have initiated the process for a new machine...  Pt saw TNichols,NP in our office 11/20/17 for incr dyspnea, awoke at 4AM smothering, SOB all day long, cannot take a deep  breath "in", has not used her nebulizer but is taking her Symbicort & spiriva, she remains on O2 at 1-2L/min flow; her exam was unremarkable x for anxiety, wt down 7# to 275#; she started her back on low dose Ativan 0.5mg Bid...     NOTE: she had bilat cataract surg in [id number] by DrShapiro...    She saw RHEUM- DrHawkes on 07/15/17>  f/u RA, underlying mild ILD, on Pred10mg  & tried to decr but had incr joint pain, also stated that she had incr dyspnea (but we know this to be due to a combination of anxiety & obesity and not so much from her rheum fibrosis); she continues on [place]; they did labs but these results are not avail to [place] to review...  Today Leara reports that her breathing is better, she feels she can get a deeper breath, feels better breathing, denies chest tightness, wheezing etc; she has chr stable DOE 7 multifactorial dyspnea...  We reviewed the following medical problems during today's office visit>        ILD/ pulm fibrosis per CXRs & prev CT Chest c/w COP/BOOP & prob underlying Reumatoid Lung>  On Pred (currently 10mg /d per DrHawkes) + Plaquenil & Arava per Rheum w/ freq OVs and slow taper of the Pred per Rheum... Last CXR 10/2016 showed heart at upper lim of norm, hyperinflation c/w COPD, chr interstitial prominence (stable)- NAD... We repeated CXR today 11/24/17 and it is stable, no acute changes...      Underlying mixed COPD w/ emphysema & chr bronchitis, former smoker quit 2003>  on Symbicort160-2spBid, Spiriva daily, Xopenex nebs Tid (only using this prn), Mucinex and rec to take meds regularly everyday...      Hx ex-induced hypoxemic resp failure>  On Home O2 at 2L/min w/ O2sat=96% on 1L in office today on her new "simply go" POC...       Hx mult small pulmonary nodules seen on prev CT scans in 2015> CT 01/2015 at [place] ([facility]) showed these nodules were  masked by the ground glass opac & interstitial process; CXR 11/16 w/ incr interstitial markings; f/u CT 07/2015 showed stable pulm nodules and resolution of the interstitial process prev seen (COP/BOOP)      OSA- eval & Rx per [facility], no data avail in Epic>  per DrStoneking & Maxwell Caul => Jacquelyn requests new machine & she is referred back to [place] [facility]...      Obesity>  Inaya weighs ~273# now, 5'7"Tall, BMI=43; she has been counseled on DIET/ EXERCISE/ wt reducing strategies & she understands that weight reduction is critical to her improving her breathing...      GERD/ prob LPR>  She needs a vigorous antireflux regimen w/ PPI (Prilosec40) Bid, NPO after dinner, elev HOB 6"; DrMJohnson is her GI...      Abn collagen-vasc screen w/ +RA, +Anti-CCP, +ANA, Sed54> SEROPOS RA>  She was prev treated by DrLevitin for Lupus 1997-2000;  She saw [facility] 02/2015 & started on ARAVA while slowly weaning Pred. EXAM shows Afeb, VSS, O2sat=96% on 1L/min pulse dose;  Wt=273#, 5'7"Tall, BMI=43;  HEENT- neg, mallampati2;  Chest- decr BS bilat, few basilar rales, no wheezing or consolidation;  Heart- RR gr1-2 SEM w/o r/g;  Abd- obese soft neg;  Ext- much improved wrists & hands  CXR 11/24/17 (independently reviewed by me in the PACS system) showed stable heart size, aortic atherosclerotic changes, stable large lung volumes + coarse lung markings- NO ACUTE CHANGES...  Again there is no recent lab data avail  for [place] to review- pt indicates that labs are followed by [facility] and [place] & not in Epic-EMR... IMP/PLAN>>  Malgorzata has a long hx of multifactorial dyspnea- exsmokes w/ COPD, hx COP/BOOP improved on Pred rx, prob mild underlying rheumatoid fibrosis stable on her slow Pred wean per [facility], morbid obesity w/ pulm restriction and OSA on CPAP per [facility]Dr[facility], hypoxemic resp failure on home O2, ANXIETY w/ chr dyspnea and sensation of not being able to get a deep breath "in" improved on Ativan... She is reassured &  requested to continue her O2, Symbicort, Spiriva, Xopenex NEBS, Ativan, and Pred taper pper DrHawkes... We will recheck pt in 17mo prior to my planned retirement.   ~  February 24, 2018:  46mo ROV & pulmonary follow up visit>         Past Medical History:  Diagnosis Date  . Allergic rhinitis   . Asthmatic bronchitis   . Colon polyps   . COPD (chronic obstructive pulmonary disease) ([place])   . Diverticulosis   . GERD (gastroesophageal reflux disease)   . H/O: GI bleed   . Hypercholesterolemia >> PCP is DrStoneking   . Migraine headache   . Obesity   . RLS (restless legs syndrome)    Osteoarthritis    RHEUMATOID ARTHRITIS >> followed by [facility]     Past Surgical History:  Procedure Laterality Date  . TONSILLECTOMY  x2  . TOTAL ABDOMINAL HYSTERECTOMY  02/17/1989    Outpatient Encounter Medications as of 02/24/2018  Medication Sig  . acetaminophen (TYLENOL) 500 MG tablet Take 1,000 mg by mouth every 6 (six) hours as needed for moderate pain.  Marland Kitchen alendronate (FOSAMAX) 70 MG tablet   . Ascorbic Acid (VITAMIN C) 1000 MG tablet Take 1,000 mg by mouth daily.  Marland Kitchen aspirin 81 MG tablet Take 81 mg by mouth every other day.  . budesonide-formoterol (SYMBICORT) 160-4.5 MCG/ACT inhaler Inhale 2 puffs into the lungs 2 (two) times daily.  . Calcium Carbonate-Vitamin D (CALCIUM + D) 600-200 MG-UNIT TABS Take 1 tablet by mouth daily.    Marland Kitchen gabapentin (NEURONTIN) 300 MG capsule Take 300 mg by mouth at bedtime.  Marland Kitchen guaiFENesin (MUCINEX) 600 MG 12 hr tablet Take 600 mg by mouth daily.   . hydroxychloroquine (PLAQUENIL) 200 MG tablet Take 200 mg by mouth 2 (two) times daily.  . Leflunomide (ARAVA PO) Take by mouth daily.  Marland Kitchen levalbuterol (XOPENEX) 1.25 MG/3ML nebulizer solution Take 1.25 mg by nebulization 3 (three) times daily. (Patient taking differently: Take 1.25 mg by nebulization 3 (three) times daily as needed. )  . lisinopril (PRINIVIL,ZESTRIL) 5 MG tablet Take 5 mg by mouth daily.   Marland Kitchen  loratadine (CLARITIN) 10 MG tablet Take 10 mg by mouth daily.    Marland Kitchen LORazepam (ATIVAN) 0.5 MG tablet Take 0.5 mg by mouth 2 (two) times daily.  . Omega-3 Fatty Acids (FISH OIL) 1200 MG CAPS Take 1 capsule by mouth daily.    Marland Kitchen omeprazole (PRILOSEC) 40 MG capsule Take 1 capsule by mouth 2 (two) times daily.  . OXYGEN Inhale into the lungs. 2 lpm with rest and exertion  . pramipexole (MIRAPEX) 0.5 MG tablet   . predniSONE (DELTASONE) 5 MG tablet Take 10 mg by mouth daily with breakfast.   . traMADol (ULTRAM) 50 MG tablet Take 1/2 to 1 tablet TID with Extra Strength Tylenol  . valACYclovir (VALTREX) 1000 MG tablet Take 1,000 mg by mouth daily as needed (for breakouts).   . budesonide-formoterol (SYMBICORT) 160-4.5 MCG/ACT inhaler Inhale 2  puffs into the lungs 2 (two) times daily.  Marland Kitchen levofloxacin (LEVAQUIN) 500 MG tablet Take 1 tablet (500 mg total) by mouth daily.  . meclizine (ANTIVERT) 25 MG tablet 1/2 to 1 tab every 6 hours as needed for dizzyness  . Tiotropium Bromide Monohydrate (SPIRIVA RESPIMAT) 1.25 MCG/ACT AERS Inhale 2 puffs into the lungs daily.  . Tiotropium Bromide Monohydrate (SPIRIVA RESPIMAT) 1.25 MCG/ACT AERS Inhale 2 puffs into the lungs daily.   No facility-administered encounter medications on file as of 02/24/2018.     Allergies  Allergen Reactions  . Shellfish Allergy Anaphylaxis    With vomiting and diarrhea  . Macrolides And Ketolides   . Penicillins     Facial numbness  . Sertraline Hcl   . Sulfa Antibiotics     Facial numbness    Immunization History  Administered Date(s) Administered  . Influenza, High Dose Seasonal PF 01/17/2016, 01/14/2017, 12/25/2017  . Influenza-Unspecified 12/30/2014  . Pneumococcal Conjugate-13 07/07/2013  . Pneumococcal Polysaccharide-23 03/03/2009  . Zoster Recombinat (Shingrix) 12/14/2016    Current Medications, Allergies, Past Medical History, Past Surgical History, Family History, and Social History were reviewed in [employer] record.   Review of Systems             All symptoms NEG except where BOLDED >>  Constitutional:  F/C/S, fatigue, anorexia, unexpected weight change. HEENT:  HA, visual changes, hearing loss, earache, nasal symptoms, sore throat, mouth sores, hoarseness. Resp:  cough, sputum, hemoptysis; SOB, tightness, wheezing. Cardio:  CP, palpit, DOE, orthopnea, edema. GI:  N/V/D/C, blood in stool; reflux, abd pain, distention, gas. GU:  dysuria, freq, urgency, hematuria, flank pain, voiding difficulty. MS:  joint pain, swelling, tenderness, decr ROM; neck pain, back pain, etc. Neuro:  HA, tremors, seizures, dizziness, syncope, weakness, numbness, gait abn. Skin:  suspicious lesions or skin rash. Heme:  adenopathy, bruising, bleeding. Psyche:  confusion, agitation, sleep disturbance, hallucinations, anxiety, depression suicidal.   Objective:   Physical Exam       Vital Signs:  Reviewed...   General:  WD, obese, 77 y/o WF in NAD; alert & oriented; pleasant & cooperative... HEENT:  [place]/AT; Conjunctiva- pink, Sclera- nonicteric, EOM-wnl, PERRLA, EACs-clear, TMs-wnl; NOSE-clear; THROAT-clear & wnl.  Neck:  Supple w/ fair ROM; no JVD; normal carotid impulses w/o bruits; no thyromegaly or nodules palpated; no lymphadenopathy.  Chest:  Decr BS bilat, few basilar rales, no wheezing rhonchi or signs of consolidation... Heart:  Regular Rhythm; norm S1 & S2 gr1-2/6 SEM, w/o rubs or gallops detected. Abdomen:  Soft & nontender- no guarding or rebound; normal bowel sounds; no organomegaly or masses palpated. Ext:  decrROM; without deformities +arthritic changes; no varicose veins, +venous insuffic, tr edema;  Pulses intact w/o bruits. Neuro:  No focal neuro deficits; sensory testing normal; gait OK & balance OK. Derm:  No lesions noted; no rash etc. Lymph:  No cervical, supraclavicular, axillary, or inguinal adenopathy palpated.   Assessment:      IMP >>      ILD/ pulm fibrosis  per CXR & CT Chest- c/w COP/BOOP, prob related to Reumatoid Lung>  On Pred (currently 10mg /d) & Arava per DrHawkes, Rheum w/ freq OVs and slow taper of the Pred...      Underlying COPD/emphysema, former smoker>  on Symbicort160-2spBid, Spiriva daily, Xopenex nebs Tid (only using this prn), Mucinex and rec to take meds regularly everyday...      Hypoxemic resp failure>  On Home O2 at 2L/min w/ O2sat=97% on 2L in office  today on her new "simply go" POC...       Hx mult small pulmonary nodules seen on prev CT scans in 2015> CT 01/2015 at [place] ([facility]) showed these nodules were masked by the ground glass opac & interstitial process; CXR 11/16 w/ incr interstitial markings.      OSA- eval & Rx per [facility], no data avail in Epic>  per DrStoneking & Maxwell Caul...      GERD/ prob LPR>  She needs a vigorous antireflux regimen w/ PPI (Prilosec40) Bid, NPO after dinner, elev HOB 6"; DrMJohnson is her GI...      Abn collagen-vasc screen w/ +RA, +Anti-CCP, +ANA, Sed54> Looks like RA-  She was prev treated by DrLevitin for Lupus 1997-2000;  She saw [facility] 02/2015 & started on ARAVA while slowly weaning Pred.  PLAN >>  02/10/15>  Start Pred 20mg  tabs- 40mg  Qam for 2 wks, then 20mg /d til return;  Refer to [place], ASAP;  Continue other meds reglarly (Symbicort, Spiriva, Xopenex, Mucinex);  Needs vigorous antireflux regimen... 03/14/15>  She is asked to keep the Pred at 10mg  per day for now due to her pulmonary process;  She has ROV tomorrow w/ Rheum for follow up & to eval her acute hand, wrist, foot arthritic process;  We are starting HOME O2 due to her acute hypoxemic resp failure...  05/24/15>   She has established w/ DrHawkes for Rheum on slow Pred taper + Arava;  Breathing is stable on O2, Pred, XopenexTid, Symbicort160, Spiriva, and Mucinex... 07/24/15>   Shakevia's breathing is stable, at her new baseline & we will proceed w/ new CT Chest to compare to the old; slow Pred taper per rheum and she  desperately needs to get on diet & get weight down; continue O2, Symbicort, Spiriva, etc... 01/22/16>   I placed Delitha on Levaquin for this bronchitic flair & rec that she take a probiotic like Align while on the antibiotic;  She will continue her O2, Prednisone, Symbicort, Spiriva, & Mucinex;  Reminded about the recommended antireflux regimen;  Also reminded about diet/ exercise & wt reduction strategies 11/12/16>   Kaaren seems to have developed some CWP/ inflamm & has assoc SOB/DOE fom this & chest wall factors- her COPD, ?rheumatoid interstitial dis, etc- all appears stable;  She is rec to REST, apply HEAT, & use Tramadol50 + Tylenol for the pain;  She will continue her current breathing meds regularly & over the longer term must get on diet, incr exercise, & must lose weight... She will cll for any worsening symptoms and f/u w/ [place] in 2-33mo. 01/21/17>   Problems as noted and she is stable on current regimen- Pred/ Plaquenil/ Arava per Rheum and O2, NEBS, Symbicort/ Spiriva, and Mucinex from [place];  We again reviewed the imperative to lose weight thru diet/ exercise/ etc 05/26/17>   Ewell has mult medical issues and a good bit of anxiety & stress;  Her RA & mild underlying ILD is controlled w/ Pred/ Arava/ Plaquenil from [facility];  Her mixed COPD is treated w/ NEBS, Symbicort, Spiriva, Mucinex;  Her hypoxemic resp failure w/ O2;  Her OSA w/ CPAP qhs from [place];  She is morbidly obese & desperately needs to lose some weight- we reviewed DIET, EXERCISE, wt reduction strategies & she should review this further w/ her PCP (they also monitor her labs), she is way too sedentary & deconditioned!  Finally she is quite anxious & under some stress- hopefully she will be able to de-stress her  life, and that the [facility] 0.5mg  - 1/2 to 1 tab bid will also provide a measure of relief. 11/24/17>   Ranee has a long hx of multifactorial dyspnea- exsmokes w/ COPD, hx COP/BOOP improved on Pred rx, prob mild underlying  rheumatoid fibrosis stable on her slow Pred wean per [facility], morbid obesity w/ pulm restriction and OSA on CPAP per [facility]DrStoneking, hypoxemic resp failure on home O2, ANXIETY w/ chr dyspnea and sensation of not being able to get a deep breath "in" improved on Ativan... She is reassured & requested to continue her O2, Symbicort, Spiriva, Xopenex NEBS, Ativan, and Pred taper pper DrHawkes... We will recheck pt in 74mo prior to my planned retirement.   Plan:                                      HOME O2 at 1-2L/min at rest and 2L/min w/ activity... Patient's Medications  New Prescriptions   BUDESONIDE-FORMOTEROL (SYMBICORT) 160-4.5 MCG/ACT INHALER    Inhale 2 puffs into the lungs 2 (two) times daily.   LEVOFLOXACIN (LEVAQUIN) 500 MG TABLET    Take 1 tablet (500 mg total) by mouth daily.   MECLIZINE (ANTIVERT) 25 MG TABLET    1/2 to 1 tab every 6 hours as needed for dizzyness   TIOTROPIUM BROMIDE MONOHYDRATE (SPIRIVA RESPIMAT) 1.25 MCG/ACT AERS    Inhale 2 puffs into the lungs daily.  Previous Medications   ACETAMINOPHEN (TYLENOL) 500 MG TABLET    Take 1,000 mg by mouth every 6 (six) hours as needed for moderate pain.   ALENDRONATE (FOSAMAX) 70 MG TABLET       ASCORBIC ACID (VITAMIN C) 1000 MG TABLET    Take 1,000 mg by mouth daily.   ASPIRIN 81 MG TABLET    Take 81 mg by mouth every other day.   BUDESONIDE-FORMOTEROL (SYMBICORT) 160-4.5 MCG/ACT INHALER    Inhale 2 puffs into the lungs 2 (two) times daily.   CALCIUM CARBONATE-VITAMIN D (CALCIUM + D) 600-200 MG-UNIT TABS    Take 1 tablet by mouth daily.     GABAPENTIN (NEURONTIN) 300 MG CAPSULE    Take 300 mg by mouth at bedtime.   GUAIFENESIN (MUCINEX) 600 MG 12 HR TABLET    Take 600 mg by mouth daily.    HYDROXYCHLOROQUINE (PLAQUENIL) 200 MG TABLET    Take 200 mg by mouth 2 (two) times daily.   LEFLUNOMIDE (ARAVA PO)    Take by mouth daily.   LEVALBUTEROL (XOPENEX) 1.25 MG/3ML NEBULIZER SOLUTION    Take 1.25 mg by nebulization 3 (three) times  daily.   LISINOPRIL (PRINIVIL,ZESTRIL) 5 MG TABLET    Take 5 mg by mouth daily.    LORATADINE (CLARITIN) 10 MG TABLET    Take 10 mg by mouth daily.     LORAZEPAM (ATIVAN) 0.5 MG TABLET    Take 0.5 mg by mouth 2 (two) times daily.   OMEGA-3 FATTY ACIDS (FISH OIL) 1200 MG CAPS    Take 1 capsule by mouth daily.     OMEPRAZOLE (PRILOSEC) 40 MG CAPSULE    Take 1 capsule by mouth 2 (two) times daily.   OXYGEN    Inhale into the lungs. 2 lpm with rest and exertion   PRAMIPEXOLE (MIRAPEX) 0.5 MG TABLET       PREDNISONE (DELTASONE) 5 MG TABLET    Take 10 mg by mouth daily with breakfast.    TIOTROPIUM  BROMIDE MONOHYDRATE (SPIRIVA RESPIMAT) 1.25 MCG/ACT AERS    Inhale 2 puffs into the lungs daily.   TRAMADOL (ULTRAM) 50 MG TABLET    Take 1/2 to 1 tablet TID with Extra Strength Tylenol   VALACYCLOVIR (VALTREX) 1000 MG TABLET    Take 1,000 mg by mouth daily as needed (for breakouts).   Modified Medications   No medications on file  Discontinued Medications   No medications on file

## 2018-03-05 DIAGNOSIS — B353 Tinea pedis: Secondary | ICD-10-CM | POA: Diagnosis not present

## 2018-03-05 DIAGNOSIS — L570 Actinic keratosis: Secondary | ICD-10-CM | POA: Diagnosis not present

## 2018-03-05 DIAGNOSIS — L820 Inflamed seborrheic keratosis: Secondary | ICD-10-CM | POA: Diagnosis not present

## 2018-03-05 DIAGNOSIS — D485 Neoplasm of uncertain behavior of skin: Secondary | ICD-10-CM | POA: Diagnosis not present

## 2018-03-05 DIAGNOSIS — D045 Carcinoma in situ of skin of trunk: Secondary | ICD-10-CM | POA: Diagnosis not present

## 2018-03-11 ENCOUNTER — Ambulatory Visit
Admission: RE | Admit: 2018-03-11 | Discharge: 2018-03-11 | Disposition: A | Discharge status: Home / Self Care | Payer: PPO | Source: Ambulatory Visit | Attending: Geriatric Medicine | Admitting: Geriatric Medicine

## 2018-03-11 DIAGNOSIS — Z1231 Encounter for screening mammogram for malignant neoplasm of breast: Secondary | ICD-10-CM

## 2018-03-12 DIAGNOSIS — R0603 Acute respiratory distress: Secondary | ICD-10-CM | POA: Diagnosis not present

## 2018-03-12 DIAGNOSIS — J439 Emphysema, unspecified: Secondary | ICD-10-CM | POA: Diagnosis not present

## 2018-03-12 DIAGNOSIS — J449 Chronic obstructive pulmonary disease, unspecified: Secondary | ICD-10-CM | POA: Diagnosis not present

## 2018-03-12 DIAGNOSIS — G4733 Obstructive sleep apnea (adult) (pediatric): Secondary | ICD-10-CM | POA: Diagnosis not present

## 2018-03-12 DIAGNOSIS — J849 Interstitial pulmonary disease, unspecified: Secondary | ICD-10-CM | POA: Diagnosis not present

## 2018-03-12 DIAGNOSIS — M05742 Rheumatoid arthritis with rheumatoid factor of left hand without organ or systems involvement: Secondary | ICD-10-CM | POA: Diagnosis not present

## 2018-03-12 DIAGNOSIS — M05741 Rheumatoid arthritis with rheumatoid factor of right hand without organ or systems involvement: Secondary | ICD-10-CM | POA: Diagnosis not present

## 2018-03-20 ENCOUNTER — Telehealth: Payer: Self-pay | Admitting: Pulmonary Disease

## 2018-03-20 NOTE — Telephone Encounter (Signed)
Call made to patient, patient states [employer] is going to remove her from the [employer] program if they don't get update [employer] forms. [employer] forms printed and update information filled out and faxed back to [employer] and marked urgent. Confirmation received, nothing further is needed at this time.

## 2018-03-24 ENCOUNTER — Telehealth: Payer: Self-pay | Admitting: Pulmonary Disease

## 2018-03-24 NOTE — Telephone Encounter (Signed)
Called and spoke with pt who stated [employer] forms were filled out by Dr. Lenna Gilford and pt but a printed name of Dr. Jeannine Kitten name had been left off. Pt stated she received two copies of the forms from [place] again but states the medical equipment had been left off of the other forms that had been resent.  Per pt, some more information had been missing from the forms. Pt stated again, more information had been left off of the forms in regards to the information with pt's health problems.  Pt would like Lattie Haw to contact her directly to get the information taken care of that needs to be on the [employer] Forms.  Lattie Haw, please advise on this for pt.Thanks!

## 2018-03-25 NOTE — Telephone Encounter (Signed)
[name] - Med Records/CHAPS states they recd [employer] forms faxed from our office.  She is faxing these back over to [place], as they do not handle this and forms will need to be faxed to [employer].

## 2018-03-25 NOTE — Telephone Encounter (Signed)
Copy of form given to SN today, 03/25/18.  Will refax once he has finished.  Patient is aware. Will call Patient once form faxed.

## 2018-03-25 NOTE — Telephone Encounter (Signed)
Forms faxed to [employer].  Confirmation received.  Patient notified.  Nothing further at this time.

## 2018-03-25 NOTE — Telephone Encounter (Signed)
Went and checked up front to see if the forms had been refaxed to our office yet by Medical Records and there was not anything on the fax machine nor in [facility]'s box.  Lattie Haw, please advise if you have received forms back that need to be taken care of for pt. Thanks!

## 2018-03-26 ENCOUNTER — Telehealth: Payer: Self-pay | Admitting: Pulmonary Disease

## 2018-03-26 NOTE — Telephone Encounter (Signed)
Spoke with pt, I advised her that [facility] faxed in the [place] form yesterday and received confirmation but agreed to send it again today. Pt understood and nothing further is needed.   I advised her to call back if they did not receive it, maybe we can send it another way. Pt agreed.

## 2018-03-27 ENCOUNTER — Telehealth: Payer: Self-pay | Admitting: Pulmonary Disease

## 2018-03-27 NOTE — Telephone Encounter (Signed)
[employer] forms,faxed multiple times per day this week, confirmation received for each fax.  Called [employer] and spoke with Cherylin. She checked with her supervisor, and was told that there is no one at there office to tell her if they received or did not receive fax.  Fax goes to a different department, and it takes several days after fax received to update.  If the fax was not receive, someone would contact her, and they would not just take her off the medical alert list.  Fax numbers [phone] and [phone], are the correct fax numbers, that were used.  Called and spoke with Patient.  Explained to Patient.  Understanding stated. Nothing further at this time.

## 2018-04-11 DIAGNOSIS — R0603 Acute respiratory distress: Secondary | ICD-10-CM | POA: Diagnosis not present

## 2018-04-11 DIAGNOSIS — G4733 Obstructive sleep apnea (adult) (pediatric): Secondary | ICD-10-CM | POA: Diagnosis not present

## 2018-04-11 DIAGNOSIS — J449 Chronic obstructive pulmonary disease, unspecified: Secondary | ICD-10-CM | POA: Diagnosis not present

## 2018-04-11 DIAGNOSIS — J439 Emphysema, unspecified: Secondary | ICD-10-CM | POA: Diagnosis not present

## 2018-04-11 DIAGNOSIS — M05742 Rheumatoid arthritis with rheumatoid factor of left hand without organ or systems involvement: Secondary | ICD-10-CM | POA: Diagnosis not present

## 2018-04-11 DIAGNOSIS — J849 Interstitial pulmonary disease, unspecified: Secondary | ICD-10-CM | POA: Diagnosis not present

## 2018-04-11 DIAGNOSIS — M05741 Rheumatoid arthritis with rheumatoid factor of right hand without organ or systems involvement: Secondary | ICD-10-CM | POA: Diagnosis not present

## 2018-04-16 DIAGNOSIS — M0579 Rheumatoid arthritis with rheumatoid factor of multiple sites without organ or systems involvement: Secondary | ICD-10-CM | POA: Diagnosis not present

## 2018-04-16 DIAGNOSIS — Z6841 Body Mass Index (BMI) 40.0 and over, adult: Secondary | ICD-10-CM | POA: Diagnosis not present

## 2018-04-16 DIAGNOSIS — J849 Interstitial pulmonary disease, unspecified: Secondary | ICD-10-CM | POA: Diagnosis not present

## 2018-04-16 DIAGNOSIS — Z79899 Other long term (current) drug therapy: Secondary | ICD-10-CM | POA: Diagnosis not present

## 2018-04-16 DIAGNOSIS — M2550 Pain in unspecified joint: Secondary | ICD-10-CM | POA: Diagnosis not present

## 2018-04-16 DIAGNOSIS — R600 Localized edema: Secondary | ICD-10-CM | POA: Diagnosis not present

## 2018-04-16 DIAGNOSIS — M170 Bilateral primary osteoarthritis of knee: Secondary | ICD-10-CM | POA: Diagnosis not present

## 2018-05-04 ENCOUNTER — Telehealth: Payer: Self-pay | Admitting: Pulmonary Disease

## 2018-05-04 NOTE — Telephone Encounter (Signed)
Called and spoke with patient. She stated that she received a letter from [employer] stating that she was removed from program. Patient stated that she spoke with someone this morning, stating that 2 boxes were checked under Patients condition box.  Condition list corrected.  Fax sent to [phone], confirmation confirmed.  Nothing further at this time.

## 2018-05-05 NOTE — Telephone Encounter (Signed)
Received new medical alert form from [facility] [employer].  Completed and signed by Dr. Lenna Gilford.  Faxed back to [employer] [phone], confirmation received. Patient aware new form has been sent.  Nothing further at this time.

## 2018-05-12 DIAGNOSIS — G4733 Obstructive sleep apnea (adult) (pediatric): Secondary | ICD-10-CM | POA: Diagnosis not present

## 2018-05-12 DIAGNOSIS — J439 Emphysema, unspecified: Secondary | ICD-10-CM | POA: Diagnosis not present

## 2018-05-12 DIAGNOSIS — J449 Chronic obstructive pulmonary disease, unspecified: Secondary | ICD-10-CM | POA: Diagnosis not present

## 2018-05-12 DIAGNOSIS — M05742 Rheumatoid arthritis with rheumatoid factor of left hand without organ or systems involvement: Secondary | ICD-10-CM | POA: Diagnosis not present

## 2018-05-12 DIAGNOSIS — M05741 Rheumatoid arthritis with rheumatoid factor of right hand without organ or systems involvement: Secondary | ICD-10-CM | POA: Diagnosis not present

## 2018-05-12 DIAGNOSIS — R0603 Acute respiratory distress: Secondary | ICD-10-CM | POA: Diagnosis not present

## 2018-05-12 DIAGNOSIS — J849 Interstitial pulmonary disease, unspecified: Secondary | ICD-10-CM | POA: Diagnosis not present

## 2018-06-12 DIAGNOSIS — J849 Interstitial pulmonary disease, unspecified: Secondary | ICD-10-CM | POA: Diagnosis not present

## 2018-06-12 DIAGNOSIS — M05741 Rheumatoid arthritis with rheumatoid factor of right hand without organ or systems involvement: Secondary | ICD-10-CM | POA: Diagnosis not present

## 2018-06-12 DIAGNOSIS — G4733 Obstructive sleep apnea (adult) (pediatric): Secondary | ICD-10-CM | POA: Diagnosis not present

## 2018-06-12 DIAGNOSIS — R0603 Acute respiratory distress: Secondary | ICD-10-CM | POA: Diagnosis not present

## 2018-06-12 DIAGNOSIS — J449 Chronic obstructive pulmonary disease, unspecified: Secondary | ICD-10-CM | POA: Diagnosis not present

## 2018-06-12 DIAGNOSIS — J439 Emphysema, unspecified: Secondary | ICD-10-CM | POA: Diagnosis not present

## 2018-06-12 DIAGNOSIS — M05742 Rheumatoid arthritis with rheumatoid factor of left hand without organ or systems involvement: Secondary | ICD-10-CM | POA: Diagnosis not present

## 2018-07-01 ENCOUNTER — Other Ambulatory Visit: Payer: Self-pay | Admitting: Pulmonary Disease

## 2018-07-01 DIAGNOSIS — M0579 Rheumatoid arthritis with rheumatoid factor of multiple sites without organ or systems involvement: Secondary | ICD-10-CM | POA: Diagnosis not present

## 2018-07-02 NOTE — Telephone Encounter (Signed)
I will not be refilling this medication.  She needs to follow-up with a provider who has seen her before.  This can need to be her primary care.  Or we can see if Dr. Lenna Gilford is comfortable refilling this medication as he is familiar with the patient.  May be Salome Arnt is willing to follow-up with him on that.  Wyn Quaker, FNP

## 2018-07-02 NOTE — Telephone Encounter (Signed)
Plwas advise on refill, SN pt. Provider of the day please advise.   Patient Instructions by Rachel Space, MD at 02/24/2018 10:30 AM  Author: Noralee Space, MD Author Type: Physician Filed: 02/24/2018 12:19 PM  Note Status: Addendum [name]: Cosign Not Required Encounter Date: 02/24/2018  Editor: Rachel Space, MD (Physician)  Prior Versions: 1. Rachel Space, MD (Physician) at 02/24/2018 11:48 AM - Signed    Today we updated your med list in our EPIC system...    Continue your current medications the same...  We gave you a Depo shot today... We wrote for Levaquin 500mg  #7 to keep on hand for sinus infection or bronchitis... We wrote a new prescription for Antivert (Meclizine) 25mg  tabs to try 1/2 to 1 tab every 6h as needed for dizziness...  We will arrang e for a follow up appt w/ Dr. Rodman Pickle in 3-4 months...  [facility]-- it has been my great honor t have been one of your doctors over these last few years...   Sending you & your family my very best wishes for good health & much happiness in the years

## 2018-07-11 DIAGNOSIS — J849 Interstitial pulmonary disease, unspecified: Secondary | ICD-10-CM | POA: Diagnosis not present

## 2018-07-11 DIAGNOSIS — G4733 Obstructive sleep apnea (adult) (pediatric): Secondary | ICD-10-CM | POA: Diagnosis not present

## 2018-07-11 DIAGNOSIS — R063 Periodic breathing: Secondary | ICD-10-CM | POA: Diagnosis not present

## 2018-07-11 DIAGNOSIS — J449 Chronic obstructive pulmonary disease, unspecified: Secondary | ICD-10-CM | POA: Diagnosis not present

## 2018-07-14 ENCOUNTER — Other Ambulatory Visit: Payer: Self-pay | Admitting: Pulmonary Disease

## 2018-07-14 MED ORDER — LORAZEPAM 0.5 MG PO TABS: 0.5000 mg | ORAL_TABLET | Freq: Two times a day (BID) | ORAL | 0 refills | Status: DC

## 2018-07-14 NOTE — Telephone Encounter (Signed)
Instructions   Today we updated your med list in our EPIC system...    Continue your current medications the same...  We gave you a Depo shot today... We wrote for Levaquin 500mg  #7 to keep on hand for sinus infection or bronchitis... We wrote a new prescription for Antivert (Meclizine) 25mg  tabs to try 1/2 to 1 tab every 6h as needed for dizziness...  We will arrang e for a follow up appt w/ Dr. Rodman Pickle in 3-4 months...  [facility]-- it has been my great honor t have been one of your doctors over these last few years...   Sending you & your family my very best wishes for good health & much happiness in the years to come!!!     ____________________  Hulen Skains and spoke with patient she is needing a refill of her lorazepam. Last prescribed by SN.   Please advise, thank you.

## 2018-07-14 NOTE — Telephone Encounter (Signed)
Reviewed patient's chart. Last OV: 02/24/18 with SN, was advised to follow up with Dr. Loanne Drilling.   Last refill was on 11/24/17 for 60 tabs, 5 refills  Last refill per pharmacy was on 04/27/18 from [facility].   Per TP, she is ok with a refill for one month with NO refills. Patient will need to ask for refills from PCP. Patient verbalized understanding.   Recall has been placed for her to see Loanne Drilling in June.   RX has been called into pharmacy.   Nothing further needed at time of call.

## 2018-08-11 DIAGNOSIS — R063 Periodic breathing: Secondary | ICD-10-CM | POA: Diagnosis not present

## 2018-08-11 DIAGNOSIS — G4733 Obstructive sleep apnea (adult) (pediatric): Secondary | ICD-10-CM | POA: Diagnosis not present

## 2018-08-11 DIAGNOSIS — J849 Interstitial pulmonary disease, unspecified: Secondary | ICD-10-CM | POA: Diagnosis not present

## 2018-08-11 DIAGNOSIS — J449 Chronic obstructive pulmonary disease, unspecified: Secondary | ICD-10-CM | POA: Diagnosis not present

## 2018-08-18 DIAGNOSIS — J449 Chronic obstructive pulmonary disease, unspecified: Secondary | ICD-10-CM | POA: Diagnosis not present

## 2018-08-18 DIAGNOSIS — G4733 Obstructive sleep apnea (adult) (pediatric): Secondary | ICD-10-CM | POA: Diagnosis not present

## 2018-08-18 DIAGNOSIS — R063 Periodic breathing: Secondary | ICD-10-CM | POA: Diagnosis not present

## 2018-08-26 DIAGNOSIS — L97529 Non-pressure chronic ulcer of other part of left foot with unspecified severity: Secondary | ICD-10-CM | POA: Diagnosis not present

## 2018-08-26 DIAGNOSIS — G2581 Restless legs syndrome: Secondary | ICD-10-CM | POA: Diagnosis not present

## 2018-08-26 DIAGNOSIS — I10 Essential (primary) hypertension: Secondary | ICD-10-CM | POA: Diagnosis not present

## 2018-08-26 DIAGNOSIS — J449 Chronic obstructive pulmonary disease, unspecified: Secondary | ICD-10-CM | POA: Diagnosis not present

## 2018-08-26 DIAGNOSIS — I700 Atherosclerosis of aorta: Secondary | ICD-10-CM | POA: Diagnosis not present

## 2018-08-26 DIAGNOSIS — J9611 Chronic respiratory failure with hypoxia: Secondary | ICD-10-CM | POA: Diagnosis not present

## 2018-09-10 DIAGNOSIS — R063 Periodic breathing: Secondary | ICD-10-CM | POA: Diagnosis not present

## 2018-09-10 DIAGNOSIS — G4733 Obstructive sleep apnea (adult) (pediatric): Secondary | ICD-10-CM | POA: Diagnosis not present

## 2018-09-10 DIAGNOSIS — J849 Interstitial pulmonary disease, unspecified: Secondary | ICD-10-CM | POA: Diagnosis not present

## 2018-09-10 DIAGNOSIS — J449 Chronic obstructive pulmonary disease, unspecified: Secondary | ICD-10-CM | POA: Diagnosis not present

## 2018-09-18 DIAGNOSIS — R269 Unspecified abnormalities of gait and mobility: Secondary | ICD-10-CM | POA: Diagnosis not present

## 2018-09-21 DIAGNOSIS — M170 Bilateral primary osteoarthritis of knee: Secondary | ICD-10-CM | POA: Diagnosis not present

## 2018-09-21 DIAGNOSIS — M2550 Pain in unspecified joint: Secondary | ICD-10-CM | POA: Diagnosis not present

## 2018-09-21 DIAGNOSIS — M0579 Rheumatoid arthritis with rheumatoid factor of multiple sites without organ or systems involvement: Secondary | ICD-10-CM | POA: Diagnosis not present

## 2018-09-21 DIAGNOSIS — J849 Interstitial pulmonary disease, unspecified: Secondary | ICD-10-CM | POA: Diagnosis not present

## 2018-09-21 DIAGNOSIS — Z79899 Other long term (current) drug therapy: Secondary | ICD-10-CM | POA: Diagnosis not present

## 2018-09-21 DIAGNOSIS — M25471 Effusion, right ankle: Secondary | ICD-10-CM | POA: Diagnosis not present

## 2018-09-21 DIAGNOSIS — R600 Localized edema: Secondary | ICD-10-CM | POA: Diagnosis not present

## 2018-09-24 DIAGNOSIS — M25471 Effusion, right ankle: Secondary | ICD-10-CM | POA: Diagnosis not present

## 2018-09-24 DIAGNOSIS — Z79899 Other long term (current) drug therapy: Secondary | ICD-10-CM | POA: Diagnosis not present

## 2018-09-24 DIAGNOSIS — M2550 Pain in unspecified joint: Secondary | ICD-10-CM | POA: Diagnosis not present

## 2018-09-24 DIAGNOSIS — J849 Interstitial pulmonary disease, unspecified: Secondary | ICD-10-CM | POA: Diagnosis not present

## 2018-09-24 DIAGNOSIS — M170 Bilateral primary osteoarthritis of knee: Secondary | ICD-10-CM | POA: Diagnosis not present

## 2018-09-24 DIAGNOSIS — M0579 Rheumatoid arthritis with rheumatoid factor of multiple sites without organ or systems involvement: Secondary | ICD-10-CM | POA: Diagnosis not present

## 2018-09-24 DIAGNOSIS — R600 Localized edema: Secondary | ICD-10-CM | POA: Diagnosis not present

## 2018-09-29 ENCOUNTER — Other Ambulatory Visit: Payer: Self-pay | Admitting: Orthopedic Surgery

## 2018-09-29 DIAGNOSIS — M25571 Pain in right ankle and joints of right foot: Secondary | ICD-10-CM

## 2018-09-29 DIAGNOSIS — M25471 Effusion, right ankle: Secondary | ICD-10-CM

## 2018-10-02 ENCOUNTER — Ambulatory Visit
Admission: RE | Admit: 2018-10-02 | Discharge: 2018-10-02 | Disposition: A | Discharge status: Home / Self Care | Payer: PPO | Source: Ambulatory Visit | Attending: Orthopedic Surgery | Admitting: Orthopedic Surgery

## 2018-10-02 DIAGNOSIS — M25471 Effusion, right ankle: Secondary | ICD-10-CM

## 2018-10-02 DIAGNOSIS — M84661A Pathological fracture in other disease, right tibia, initial encounter for fracture: Secondary | ICD-10-CM | POA: Diagnosis not present

## 2018-10-02 DIAGNOSIS — M25571 Pain in right ankle and joints of right foot: Secondary | ICD-10-CM

## 2018-10-06 DIAGNOSIS — S82391A Other fracture of lower end of right tibia, initial encounter for closed fracture: Secondary | ICD-10-CM | POA: Diagnosis not present

## 2018-10-07 DIAGNOSIS — I10 Essential (primary) hypertension: Secondary | ICD-10-CM | POA: Diagnosis not present

## 2018-10-07 DIAGNOSIS — J441 Chronic obstructive pulmonary disease with (acute) exacerbation: Secondary | ICD-10-CM | POA: Diagnosis not present

## 2018-10-07 DIAGNOSIS — J9611 Chronic respiratory failure with hypoxia: Secondary | ICD-10-CM | POA: Diagnosis not present

## 2018-10-07 DIAGNOSIS — M25571 Pain in right ankle and joints of right foot: Secondary | ICD-10-CM | POA: Diagnosis not present

## 2018-10-11 DIAGNOSIS — R063 Periodic breathing: Secondary | ICD-10-CM | POA: Diagnosis not present

## 2018-10-11 DIAGNOSIS — J849 Interstitial pulmonary disease, unspecified: Secondary | ICD-10-CM | POA: Diagnosis not present

## 2018-10-11 DIAGNOSIS — G4733 Obstructive sleep apnea (adult) (pediatric): Secondary | ICD-10-CM | POA: Diagnosis not present

## 2018-10-11 DIAGNOSIS — J449 Chronic obstructive pulmonary disease, unspecified: Secondary | ICD-10-CM | POA: Diagnosis not present

## 2018-11-03 DIAGNOSIS — S82391D Other fracture of lower end of right tibia, subsequent encounter for closed fracture with routine healing: Secondary | ICD-10-CM | POA: Diagnosis not present

## 2018-11-10 DIAGNOSIS — J849 Interstitial pulmonary disease, unspecified: Secondary | ICD-10-CM | POA: Diagnosis not present

## 2018-11-10 DIAGNOSIS — G4733 Obstructive sleep apnea (adult) (pediatric): Secondary | ICD-10-CM | POA: Diagnosis not present

## 2018-11-10 DIAGNOSIS — J449 Chronic obstructive pulmonary disease, unspecified: Secondary | ICD-10-CM | POA: Diagnosis not present

## 2018-11-10 DIAGNOSIS — R063 Periodic breathing: Secondary | ICD-10-CM | POA: Diagnosis not present

## 2018-11-12 DIAGNOSIS — G4733 Obstructive sleep apnea (adult) (pediatric): Secondary | ICD-10-CM | POA: Diagnosis not present

## 2018-11-12 DIAGNOSIS — J449 Chronic obstructive pulmonary disease, unspecified: Secondary | ICD-10-CM | POA: Diagnosis not present

## 2018-11-12 DIAGNOSIS — R063 Periodic breathing: Secondary | ICD-10-CM | POA: Diagnosis not present

## 2018-11-12 DIAGNOSIS — J849 Interstitial pulmonary disease, unspecified: Secondary | ICD-10-CM | POA: Diagnosis not present

## 2018-12-08 DIAGNOSIS — S82391D Other fracture of lower end of right tibia, subsequent encounter for closed fracture with routine healing: Secondary | ICD-10-CM | POA: Diagnosis not present

## 2018-12-11 DIAGNOSIS — G4733 Obstructive sleep apnea (adult) (pediatric): Secondary | ICD-10-CM | POA: Diagnosis not present

## 2018-12-11 DIAGNOSIS — J449 Chronic obstructive pulmonary disease, unspecified: Secondary | ICD-10-CM | POA: Diagnosis not present

## 2018-12-11 DIAGNOSIS — R063 Periodic breathing: Secondary | ICD-10-CM | POA: Diagnosis not present

## 2018-12-11 DIAGNOSIS — J849 Interstitial pulmonary disease, unspecified: Secondary | ICD-10-CM | POA: Diagnosis not present

## 2018-12-13 DIAGNOSIS — J849 Interstitial pulmonary disease, unspecified: Secondary | ICD-10-CM | POA: Diagnosis not present

## 2018-12-13 DIAGNOSIS — J449 Chronic obstructive pulmonary disease, unspecified: Secondary | ICD-10-CM | POA: Diagnosis not present

## 2018-12-13 DIAGNOSIS — R063 Periodic breathing: Secondary | ICD-10-CM | POA: Diagnosis not present

## 2018-12-13 DIAGNOSIS — G4733 Obstructive sleep apnea (adult) (pediatric): Secondary | ICD-10-CM | POA: Diagnosis not present

## 2018-12-23 DIAGNOSIS — J849 Interstitial pulmonary disease, unspecified: Secondary | ICD-10-CM | POA: Diagnosis not present

## 2018-12-23 DIAGNOSIS — M170 Bilateral primary osteoarthritis of knee: Secondary | ICD-10-CM | POA: Diagnosis not present

## 2018-12-23 DIAGNOSIS — Z6841 Body Mass Index (BMI) 40.0 and over, adult: Secondary | ICD-10-CM | POA: Diagnosis not present

## 2018-12-23 DIAGNOSIS — D539 Nutritional anemia, unspecified: Secondary | ICD-10-CM | POA: Diagnosis not present

## 2018-12-23 DIAGNOSIS — D649 Anemia, unspecified: Secondary | ICD-10-CM | POA: Diagnosis not present

## 2018-12-23 DIAGNOSIS — M810 Age-related osteoporosis without current pathological fracture: Secondary | ICD-10-CM | POA: Diagnosis not present

## 2018-12-23 DIAGNOSIS — M0579 Rheumatoid arthritis with rheumatoid factor of multiple sites without organ or systems involvement: Secondary | ICD-10-CM | POA: Diagnosis not present

## 2018-12-23 DIAGNOSIS — M2550 Pain in unspecified joint: Secondary | ICD-10-CM | POA: Diagnosis not present

## 2018-12-23 DIAGNOSIS — Z79899 Other long term (current) drug therapy: Secondary | ICD-10-CM | POA: Diagnosis not present

## 2018-12-31 ENCOUNTER — Other Ambulatory Visit: Payer: Self-pay | Admitting: Rheumatology

## 2018-12-31 DIAGNOSIS — M810 Age-related osteoporosis without current pathological fracture: Secondary | ICD-10-CM

## 2018-12-31 DIAGNOSIS — Z1231 Encounter for screening mammogram for malignant neoplasm of breast: Secondary | ICD-10-CM

## 2019-01-06 DIAGNOSIS — J449 Chronic obstructive pulmonary disease, unspecified: Secondary | ICD-10-CM | POA: Diagnosis not present

## 2019-01-06 DIAGNOSIS — M05741 Rheumatoid arthritis with rheumatoid factor of right hand without organ or systems involvement: Secondary | ICD-10-CM | POA: Diagnosis not present

## 2019-01-06 DIAGNOSIS — I10 Essential (primary) hypertension: Secondary | ICD-10-CM | POA: Diagnosis not present

## 2019-01-06 DIAGNOSIS — J9611 Chronic respiratory failure with hypoxia: Secondary | ICD-10-CM | POA: Diagnosis not present

## 2019-01-11 DIAGNOSIS — J449 Chronic obstructive pulmonary disease, unspecified: Secondary | ICD-10-CM | POA: Diagnosis not present

## 2019-01-11 DIAGNOSIS — G4733 Obstructive sleep apnea (adult) (pediatric): Secondary | ICD-10-CM | POA: Diagnosis not present

## 2019-01-11 DIAGNOSIS — J849 Interstitial pulmonary disease, unspecified: Secondary | ICD-10-CM | POA: Diagnosis not present

## 2019-01-11 DIAGNOSIS — R063 Periodic breathing: Secondary | ICD-10-CM | POA: Diagnosis not present

## 2019-01-13 DIAGNOSIS — J449 Chronic obstructive pulmonary disease, unspecified: Secondary | ICD-10-CM | POA: Diagnosis not present

## 2019-01-13 DIAGNOSIS — G4733 Obstructive sleep apnea (adult) (pediatric): Secondary | ICD-10-CM | POA: Diagnosis not present

## 2019-01-13 DIAGNOSIS — R063 Periodic breathing: Secondary | ICD-10-CM | POA: Diagnosis not present

## 2019-01-13 DIAGNOSIS — J849 Interstitial pulmonary disease, unspecified: Secondary | ICD-10-CM | POA: Diagnosis not present

## 2019-01-26 ENCOUNTER — Ambulatory Visit: Payer: PPO | Admitting: Pulmonary Disease

## 2019-01-26 ENCOUNTER — Encounter: Payer: Self-pay | Admitting: Pulmonary Disease

## 2019-01-26 ENCOUNTER — Other Ambulatory Visit: Payer: Self-pay

## 2019-01-26 VITALS — BP 144/80 | HR 108 | Temp 98.0°F | Ht 66.0 in | Wt 280.4 lb

## 2019-01-26 DIAGNOSIS — J849 Interstitial pulmonary disease, unspecified: Secondary | ICD-10-CM | POA: Diagnosis not present

## 2019-01-26 DIAGNOSIS — J9611 Chronic respiratory failure with hypoxia: Secondary | ICD-10-CM | POA: Diagnosis not present

## 2019-01-26 DIAGNOSIS — R942 Abnormal results of pulmonary function studies: Secondary | ICD-10-CM | POA: Diagnosis not present

## 2019-01-26 MED ORDER — SPIRIVA RESPIMAT 1.25 MCG/ACT IN AERS: 2.0000 | INHALATION_SPRAY | Freq: Every day | RESPIRATORY_TRACT | 0 refills | Status: DC

## 2019-01-26 MED ORDER — BUDESONIDE-FORMOTEROL FUMARATE 160-4.5 MCG/ACT IN AERO: 2.0000 | INHALATION_SPRAY | Freq: Two times a day (BID) | RESPIRATORY_TRACT | 0 refills | Status: DC

## 2019-01-26 MED ORDER — SPIRIVA RESPIMAT 1.25 MCG/ACT IN AERS: 2.0000 | INHALATION_SPRAY | Freq: Every day | RESPIRATORY_TRACT | 6 refills | Status: DC

## 2019-01-26 MED ORDER — LORAZEPAM 0.5 MG PO TABS: 0.5000 mg | ORAL_TABLET | Freq: Two times a day (BID) | ORAL | 0 refills | Status: DC

## 2019-01-26 MED ORDER — BUDESONIDE-FORMOTEROL FUMARATE 160-4.5 MCG/ACT IN AERO: 2.0000 | INHALATION_SPRAY | Freq: Two times a day (BID) | RESPIRATORY_TRACT | 6 refills | Status: DC

## 2019-01-26 NOTE — Progress Notes (Signed)
Subjective:   PATIENT ID: [name] GENDER: female DOB: [birth year], MRN: [id number]   HPI  Chief Complaint  Patient presents with  . Follow-up    former Dr. Lenna Gilford patient , COPD, Emphysema, Asthma    Reason for Visit: Follow-up   [name] is a 78 year old female former smoker with COPD/emphysema and RA-related ILD who presents for follow-up.  She is a former patient of Dr. Lenna Gilford. She was last seen on 02/2018 for management of RA-related ILD, obstructive lung disease on home O2, OSA, hx of pulmonary nodules and reflux. Prior notes were reviewed in EMR including last visit from 02/2018.  She has yearly respiratory infection that usually requires is treated with Levaquin. She continues on prednisone 10 mg, HCQ and Leflunomide for RA and ILD which is managed by her Rheumatologist Dr. Trudie Reed. She is compliant with her Symbicort BID and Spriva daily. She rarely uses her Xopenex, usually twice a year. Last spring was the last time. Denies chronic cough. Occasional wheezing if she congested. She is compliant with her home oxygen requiring 2L with exertion. Currently, she denies any current respiratory symptoms except for dyspnea on exertion which is baseline for her.  She wears her CPAP nightly, wearing for 5-7 hours. She reports less fatigue and improved quality of sleep when using her machine.  Social History: Former smoker. Quit in 2003. 46 pack year history.  I have personally reviewed patient's past medical/family/social history, allergies, current medications.  Past Medical History:  Diagnosis Date  . Allergic rhinitis   . Asthma   . Asthmatic bronchitis   . Colon polyps   . COPD (chronic obstructive pulmonary disease) ([place])   . Diverticulosis   . GERD (gastroesophageal reflux disease)   . H/O: GI bleed   . Hypercholesterolemia   . Migraine headache   . Obesity   . RLS (restless legs syndrome)      Family History  Problem Relation Age of Onset  . Allergies  Mother   . Asthma Mother   . Breast cancer Mother   . Allergies Father      Social History   Occupational History  . Occupation: retired from office work  Tobacco Use  . Smoking status: Former Smoker    Packs/day: 1.00    Years: 45.00    Pack years: 45.00    Types: Cigarettes    Quit date: 04/15/2001    Years since quitting: 17.7  . Smokeless tobacco: Never Used  Substance and Sexual Activity  . Alcohol use: No  . Drug use: No  . Sexual activity: Not on file    Allergies  Allergen Reactions  . Shellfish Allergy Anaphylaxis    With vomiting and diarrhea  . Macrolides And Ketolides   . Penicillins     Facial numbness  . Sertraline Hcl   . Sulfa Antibiotics     Facial numbness     Outpatient Medications Prior to Visit  Medication Sig Dispense Refill  . acetaminophen (TYLENOL) 500 MG tablet Take 1,000 mg by mouth every 6 (six) hours as needed for moderate pain.    Marland Kitchen alendronate (FOSAMAX) 70 MG tablet     . Ascorbic Acid (VITAMIN C) 1000 MG tablet Take 1,000 mg by mouth daily.    Marland Kitchen aspirin 81 MG tablet Take 81 mg by mouth every other day.    . budesonide-formoterol (SYMBICORT) 160-4.5 MCG/ACT inhaler Inhale 2 puffs into the lungs 2 (two) times daily. 2 Inhaler 0  .  budesonide-formoterol (SYMBICORT) 160-4.5 MCG/ACT inhaler Inhale 2 puffs into the lungs 2 (two) times daily. 1 Inhaler 0  . Calcium Carbonate-Vitamin D (CALCIUM + D) 600-200 MG-UNIT TABS Take 1 tablet by mouth daily.      Marland Kitchen gabapentin (NEURONTIN) 300 MG capsule Take 300 mg by mouth at bedtime.    Marland Kitchen guaiFENesin (MUCINEX) 600 MG 12 hr tablet Take 600 mg by mouth daily.     . hydroxychloroquine (PLAQUENIL) 200 MG tablet Take 200 mg by mouth 2 (two) times daily.    . Leflunomide (ARAVA PO) Take by mouth daily.    Marland Kitchen levalbuterol (XOPENEX) 1.25 MG/3ML nebulizer solution Take 1.25 mg by nebulization 3 (three) times daily. (Patient taking differently: Take 1.25 mg by nebulization 3 (three) times daily as needed. ) 270 mL  prn  . levofloxacin (LEVAQUIN) 500 MG tablet Take 1 tablet (500 mg total) by mouth daily. 7 tablet 2  . lisinopril (PRINIVIL,ZESTRIL) 5 MG tablet Take 5 mg by mouth daily.   6  . loratadine (CLARITIN) 10 MG tablet Take 10 mg by mouth daily.      Marland Kitchen LORazepam (ATIVAN) 0.5 MG tablet Take 1 tablet (0.5 mg total) by mouth 2 (two) times daily. 60 tablet 0  . meclizine (ANTIVERT) 25 MG tablet 1/2 to 1 tab every 6 hours as needed for dizzyness 50 tablet 2  . Omega-3 Fatty Acids (FISH OIL) 1200 MG CAPS Take 1 capsule by mouth daily.      Marland Kitchen omeprazole (PRILOSEC) 40 MG capsule Take 1 capsule by mouth 2 (two) times daily.    . OXYGEN Inhale into the lungs. 2 lpm with rest and exertion    . pramipexole (MIRAPEX) 0.5 MG tablet     . predniSONE (DELTASONE) 5 MG tablet Take 10 mg by mouth daily with breakfast.     . Tiotropium Bromide Monohydrate (SPIRIVA RESPIMAT) 1.25 MCG/ACT AERS Inhale 2 puffs into the lungs daily. 2 Inhaler 0  . Tiotropium Bromide Monohydrate (SPIRIVA RESPIMAT) 1.25 MCG/ACT AERS Inhale 2 puffs into the lungs daily. 1 Inhaler 0  . traMADol (ULTRAM) 50 MG tablet Take 1/2 to 1 tablet TID with Extra Strength Tylenol 90 tablet 0  . valACYclovir (VALTREX) 1000 MG tablet Take 1,000 mg by mouth daily as needed (for breakouts).      No facility-administered medications prior to visit.     Review of Systems  Constitutional: Negative for chills, diaphoresis, fever, malaise/fatigue and weight loss.  HENT: Negative for congestion, ear pain and sore throat.   Respiratory: Positive for shortness of breath. Negative for cough, hemoptysis, sputum production and wheezing.   Cardiovascular: Positive for leg swelling. Negative for chest pain and palpitations.  Gastrointestinal: Negative for abdominal pain, heartburn and nausea.  Genitourinary: Positive for frequency.  Musculoskeletal: Positive for joint pain and myalgias.  Skin: Negative for itching and rash.  Neurological: Positive for weakness.  Negative for dizziness and headaches.  Endo/Heme/Allergies: Bruises/bleeds easily.  Psychiatric/Behavioral: Negative for depression. The patient is nervous/anxious.      Objective:   Vitals:   01/26/19 1103 01/26/19 1104  BP:  (!) 144/80  Pulse:  (!) 108  Temp: 98 F (36.7 C)   TempSrc: Temporal   SpO2:  91%  Weight: 280 lb 6.4 oz (127.2 kg)   Height: 5\' 6"  (1.676 m)       Physical Exam: General: Well-appearing, no acute distress HENT: [place], AT Eyes: EOMI, no scleral icterus Respiratory: Clear to auscultation bilaterally.  No crackles, wheezing or rales Cardiovascular:  RRR, -M/R/G, no JVD GI: BS+, soft, nontender Extremities:-Edema,-tenderness Neuro: AAO x4, CNII-XII grossly intact Skin: Intact, no rashes or bruising Psych: Normal mood, normal affect  Data Reviewed:  Imaging: CT Chest 07/28/15 - multiple stable pulmonary nodules, emphysema. Subpleural reticulation in lower lobes in the lung  PFT: 03/30/15 FVC 2.29 (75%) FEV1 1.61 (70%) Ratio 68  TLC 77% DLCO 50% Interpretation: Mild obstructive and restrictive lung disease with reduced gas exchange  Labs: CBC    Component Value Date/Time   WBC 14.7 (H) 03/13/2015 1107   RBC 4.12 03/13/2015 1107   HGB 12.6 03/13/2015 1107   HCT 38.7 03/13/2015 1107   PLT 341 03/13/2015 1107   MCV 93.9 03/13/2015 1107   MCH 30.6 03/13/2015 1107   MCHC 32.6 03/13/2015 1107   RDW 16.0 (H) 03/13/2015 1107   LYMPHSABS 3.2 02/06/2015 1129   MONOABS 1.7 (H) 02/06/2015 1129   EOSABS 0.3 02/06/2015 1129   BASOSABS 0.1 02/06/2015 1129   BMET    Component Value Date/Time   NA 141 03/13/2015 1107   K 4.2 03/13/2015 1107   CL 105 03/13/2015 1107   CO2 24 03/13/2015 1107   GLUCOSE 146 (H) 03/13/2015 1107   BUN 12 03/13/2015 1107   CREATININE 0.71 03/13/2015 1107   CALCIUM 9.4 03/13/2015 1107   GFRNONAA >60 03/13/2015 1107   GFRAA >60 03/13/2015 1107   Imaging, labs and tests noted above have been reviewed independently by me.     Assessment & Plan:   Discussion: 78 year old female former smoker with COPD/emphysema and RA-related ILD who presents for follow-up.  Chronic hypoxemic respiratory failure RA-related interstitial lung disease --CONTINUE your inhalers as directed --Will order prescription for Symbicort and Spiriva inhalers --Refilled lorazepam --Please call our office if you develop signs and symptoms concerning for respiratory infection. Levaquin has worked well for you in the past and would be a reasonable antibiotic to consider in the future.  Health Maintenance Immunization History  Administered Date(s) Administered  . Influenza, High Dose Seasonal PF 01/17/2016, 01/14/2017, 12/25/2017, 01/11/2019  . Influenza-Unspecified 12/30/2014  . Pneumococcal Conjugate-13 07/07/2013  . Pneumococcal Polysaccharide-23 03/03/2009  . Zoster Recombinat (Shingrix) 12/14/2016    No orders of the defined types were placed in this encounter.  Meds ordered this encounter  Medications  . LORazepam (ATIVAN) 0.5 MG tablet    Sig: Take 1 tablet (0.5 mg total) by mouth 2 (two) times daily.    Dispense:  60 tablet    Refill:  0  . budesonide-formoterol (SYMBICORT) 160-4.5 MCG/ACT inhaler    Sig: Inhale 2 puffs into the lungs 2 (two) times daily.    Dispense:  1 Inhaler    Refill:  0    Order Specific Question:   Lot Number?    [id number]    Order Specific Question:   Expiration Date?    Answer:   11/14/2019    Order Specific Question:   Manufacturer?    Answer:   [employer] [[id number]]    Order Specific Question:   Quantity    Answer:   2  . budesonide-formoterol (SYMBICORT) 160-4.5 MCG/ACT inhaler    Sig: Inhale 2 puffs into the lungs 2 (two) times daily.    Dispense:  1 Inhaler    Refill:  6  . Tiotropium Bromide Monohydrate (SPIRIVA RESPIMAT) 1.25 MCG/ACT AERS    Sig: Inhale 2 puffs into the lungs daily.    Dispense:  4 g    Refill:  6  .  Tiotropium Bromide Monohydrate (SPIRIVA RESPIMAT) 1.25  MCG/ACT AERS    Sig: Inhale 2 puffs into the lungs daily.    Dispense:  4 g    Refill:  0    Order Specific Question:   Lot Number?    Answer:   [id number]    Order Specific Question:   Expiration Date?    Answer:   07/14/2020    Order Specific Question:   Quantity    Answer:   2    Return in about 4 months (around 05/29/2019).  [place], MD [facility] 01/26/2019 7:52 AM  Office Number [phone]

## 2019-01-26 NOTE — Patient Instructions (Signed)
Chronic hypoxemic respiratory failure RA-related interstitial lung disease --CONTINUE your inhalers as directed --Will order prescription for Symbicort and Spiriva inhalers --Refilled lorazepam --Please call our office if you develop signs and symptoms concerning for respiratory infection. Levaquin has worked well for you in the past and would be a reasonable antibiotic to consider in the future.  Follow-up in 4 months

## 2019-02-04 ENCOUNTER — Telehealth: Payer: Self-pay | Admitting: Adult Health

## 2019-02-04 NOTE — Telephone Encounter (Signed)
[facility] office is closed  Will leave in triage

## 2019-02-05 NOTE — Telephone Encounter (Signed)
LMTCB x1 for [facility] with [facility].

## 2019-02-05 NOTE — Telephone Encounter (Signed)
Spoke with Jenny Reichmann from Rheumatology, Dr. Trudie Reed office. She states Tammy Parrett keeps ordering lorazepam for patient and patient is Rx'd tramodol from Dr. Trudie Reed every month. Patient has been getting Lorazepam from [facility] also.  These two drugs cannot be taken together. Dr. Trudie Reed just wanted to let know patient as refilled this month but if patient needs to be on Lorazepam an alternative needs to be addressed, or [facility] can take over the tramodol order as well.   Will route to [place] as she last ordered this.  JE back in office on 02/08/19 Nothing further needed

## 2019-02-05 NOTE — Telephone Encounter (Signed)
Jenny Reichmann from [facility] Rheumatology returning call.  [phone]

## 2019-02-12 DIAGNOSIS — J849 Interstitial pulmonary disease, unspecified: Secondary | ICD-10-CM | POA: Diagnosis not present

## 2019-02-12 DIAGNOSIS — Z79899 Other long term (current) drug therapy: Secondary | ICD-10-CM | POA: Diagnosis not present

## 2019-02-12 DIAGNOSIS — J449 Chronic obstructive pulmonary disease, unspecified: Secondary | ICD-10-CM | POA: Diagnosis not present

## 2019-02-12 DIAGNOSIS — R063 Periodic breathing: Secondary | ICD-10-CM | POA: Diagnosis not present

## 2019-02-12 DIAGNOSIS — H524 Presbyopia: Secondary | ICD-10-CM | POA: Diagnosis not present

## 2019-02-12 DIAGNOSIS — Z961 Presence of intraocular lens: Secondary | ICD-10-CM | POA: Diagnosis not present

## 2019-02-12 DIAGNOSIS — G4733 Obstructive sleep apnea (adult) (pediatric): Secondary | ICD-10-CM | POA: Diagnosis not present

## 2019-02-12 DIAGNOSIS — M0609 Rheumatoid arthritis without rheumatoid factor, multiple sites: Secondary | ICD-10-CM | POA: Diagnosis not present

## 2019-02-26 NOTE — Telephone Encounter (Signed)
[name],  Please call Dr. Trudie Reed Rheumatology office with the following message:  Dr. Loanne Drilling has contacted the patient regarding our concern about having two controlled substances prescribed by two different subspecialty offices. We certainly understand her need to continue her Tramadol for her Rheumatologist. Patient reports that she is taking the medication on as needed once daily for her anxiety that was initially started by her prior Pulmonologist/Internal Medicine doctor. Would your office be comfortable prescribing ativan for her anxiety or can she arrange for her primary care provider take over? In the interim, our office is willing to bridge her care until she can find a different provider for ativan.  Rodman Pickle, M.D. [facility] Pulmonary/[facility] Care Medicine 02/26/2019 3:01 PM

## 2019-03-02 ENCOUNTER — Telehealth: Payer: Self-pay | Admitting: Pulmonary Disease

## 2019-03-02 NOTE — Telephone Encounter (Signed)
Spoke with Jenny Reichmann at Dr. Trudie Reed office, She states they will not turn patient away.  She also states they will not manage Anxiety. Dr. Trudie Reed will continue to RX tramadol but not if we keep prescribing ativan  Dr. Loanne Drilling wants her cell phone number left for Dr. Trudie Reed.   Will route to [place] for [facility]

## 2019-03-02 NOTE — Telephone Encounter (Signed)
Discussed with Dr. Trudie Reed regarding patient's anxiety management with lorazepam. Our office will ask patient to speak with patient to transition lorazepam management/weaning with her PCP for her anxiety. Pulmonary will no longer provide refills for lorazepam for patient.

## 2019-03-02 NOTE — Telephone Encounter (Signed)
Routing to Dr. Ellison as an FYI. 

## 2019-03-03 NOTE — Telephone Encounter (Signed)
Patient quit taking lorazepam. Patient is taking the zoloft started about a month ago. And is going to work with Dr. Felipa Eth on her management of anxiety.   Will route to [place] as fyi Nothing further needed at this time

## 2019-03-15 DIAGNOSIS — G4733 Obstructive sleep apnea (adult) (pediatric): Secondary | ICD-10-CM | POA: Diagnosis not present

## 2019-03-15 DIAGNOSIS — J849 Interstitial pulmonary disease, unspecified: Secondary | ICD-10-CM | POA: Diagnosis not present

## 2019-03-15 DIAGNOSIS — J449 Chronic obstructive pulmonary disease, unspecified: Secondary | ICD-10-CM | POA: Diagnosis not present

## 2019-03-15 DIAGNOSIS — R063 Periodic breathing: Secondary | ICD-10-CM | POA: Diagnosis not present

## 2019-03-19 ENCOUNTER — Ambulatory Visit
Admission: RE | Admit: 2019-03-19 | Discharge: 2019-03-19 | Disposition: A | Discharge status: Home / Self Care | Payer: PPO | Source: Ambulatory Visit | Attending: Rheumatology | Admitting: Rheumatology

## 2019-03-19 ENCOUNTER — Other Ambulatory Visit: Payer: Self-pay

## 2019-03-19 DIAGNOSIS — Z1231 Encounter for screening mammogram for malignant neoplasm of breast: Secondary | ICD-10-CM

## 2019-03-19 DIAGNOSIS — Z780 Asymptomatic menopausal state: Secondary | ICD-10-CM | POA: Diagnosis not present

## 2019-03-19 DIAGNOSIS — M8589 Other specified disorders of bone density and structure, multiple sites: Secondary | ICD-10-CM | POA: Diagnosis not present

## 2019-03-19 DIAGNOSIS — M810 Age-related osteoporosis without current pathological fracture: Secondary | ICD-10-CM | POA: Diagnosis not present

## 2019-03-22 DIAGNOSIS — R063 Periodic breathing: Secondary | ICD-10-CM | POA: Diagnosis not present

## 2019-03-22 DIAGNOSIS — J449 Chronic obstructive pulmonary disease, unspecified: Secondary | ICD-10-CM | POA: Diagnosis not present

## 2019-03-22 DIAGNOSIS — G4733 Obstructive sleep apnea (adult) (pediatric): Secondary | ICD-10-CM | POA: Diagnosis not present

## 2019-03-26 DIAGNOSIS — M170 Bilateral primary osteoarthritis of knee: Secondary | ICD-10-CM | POA: Diagnosis not present

## 2019-03-26 DIAGNOSIS — Z79899 Other long term (current) drug therapy: Secondary | ICD-10-CM | POA: Diagnosis not present

## 2019-03-26 DIAGNOSIS — M2550 Pain in unspecified joint: Secondary | ICD-10-CM | POA: Diagnosis not present

## 2019-03-26 DIAGNOSIS — Z6841 Body Mass Index (BMI) 40.0 and over, adult: Secondary | ICD-10-CM | POA: Diagnosis not present

## 2019-03-26 DIAGNOSIS — M0579 Rheumatoid arthritis with rheumatoid factor of multiple sites without organ or systems involvement: Secondary | ICD-10-CM | POA: Diagnosis not present

## 2019-03-26 DIAGNOSIS — M810 Age-related osteoporosis without current pathological fracture: Secondary | ICD-10-CM | POA: Diagnosis not present

## 2019-03-26 DIAGNOSIS — J849 Interstitial pulmonary disease, unspecified: Secondary | ICD-10-CM | POA: Diagnosis not present

## 2019-04-14 DIAGNOSIS — R063 Periodic breathing: Secondary | ICD-10-CM | POA: Diagnosis not present

## 2019-04-14 DIAGNOSIS — J849 Interstitial pulmonary disease, unspecified: Secondary | ICD-10-CM | POA: Diagnosis not present

## 2019-04-14 DIAGNOSIS — G4733 Obstructive sleep apnea (adult) (pediatric): Secondary | ICD-10-CM | POA: Diagnosis not present

## 2019-04-14 DIAGNOSIS — J449 Chronic obstructive pulmonary disease, unspecified: Secondary | ICD-10-CM | POA: Diagnosis not present

## 2019-05-15 DIAGNOSIS — J449 Chronic obstructive pulmonary disease, unspecified: Secondary | ICD-10-CM | POA: Diagnosis not present

## 2019-05-15 DIAGNOSIS — R063 Periodic breathing: Secondary | ICD-10-CM | POA: Diagnosis not present

## 2019-05-15 DIAGNOSIS — J849 Interstitial pulmonary disease, unspecified: Secondary | ICD-10-CM | POA: Diagnosis not present

## 2019-05-15 DIAGNOSIS — G4733 Obstructive sleep apnea (adult) (pediatric): Secondary | ICD-10-CM | POA: Diagnosis not present

## 2019-05-26 ENCOUNTER — Ambulatory Visit: Payer: PPO | Admitting: Pulmonary Disease

## 2019-06-01 ENCOUNTER — Other Ambulatory Visit: Payer: Self-pay

## 2019-06-01 ENCOUNTER — Encounter: Payer: Self-pay | Admitting: Pulmonary Disease

## 2019-06-01 ENCOUNTER — Ambulatory Visit: Payer: PPO | Admitting: Pulmonary Disease

## 2019-06-01 VITALS — BP 122/80 | HR 117 | Temp 97.2°F | Ht 67.0 in | Wt 284.2 lb

## 2019-06-01 DIAGNOSIS — J9611 Chronic respiratory failure with hypoxia: Secondary | ICD-10-CM | POA: Diagnosis not present

## 2019-06-01 DIAGNOSIS — J849 Interstitial pulmonary disease, unspecified: Secondary | ICD-10-CM | POA: Diagnosis not present

## 2019-06-01 MED ORDER — TRELEGY ELLIPTA 200-62.5-25 MCG/INH IN AEPB: 1.0000 | INHALATION_SPRAY | Freq: Every day | RESPIRATORY_TRACT | 0 refills | Status: DC

## 2019-06-01 MED ORDER — TRELEGY ELLIPTA 200-62.5-25 MCG/INH IN AEPB: 1.0000 | INHALATION_SPRAY | Freq: Every day | RESPIRATORY_TRACT | 6 refills | Status: DC

## 2019-06-01 NOTE — Progress Notes (Signed)
Patient seen in the office today and instructed on use of ellitpa.  Patient expressed understanding and demonstrated technique.

## 2019-06-01 NOTE — Patient Instructions (Addendum)
Chronic hypoxemic respiratory failure RA-related interstitial lung disease --STOP Symbicort and SPIRIVA --START Trelegy ONE puff ONCE a days --CONTINUE nebulizer as needed for shortness of breath or wheezing --Wear supplemental oxygen with ALL activity and rest. Goal level >88%

## 2019-06-01 NOTE — Progress Notes (Signed)
Subjective:   PATIENT ID: [name] GENDER: female DOB: [birth year], MRN: [id number]   HPI  Chief Complaint  Patient presents with  . Follow-up    decreased prednisone 7.5mg  daily from 10mg -still having shortness of breath     Reason for Visit: Follow-up   [name] is a 79 year old female former smoker with COPD/emphysema and RA-related ILD who presents for follow-up.  She is a former patient of Dr. Lenna Gilford. She was last seen on 02/2018 for management of RA-related ILD, obstructive lung disease on home O2, OSA, hx of pulmonary nodules and reflux. Prior notes were reviewed in EMR including last visit from 02/2018.  She has yearly respiratory infection that usually requires is treated with Levaquin. She continues on prednisone 10 mg, HCQ and Leflunomide for RA and ILD which is managed by her Rheumatologist Dr. Trudie Reed. She is compliant with her Symbicort BID and Spriva daily. She rarely uses her Xopenex, usually twice a year. Last spring was the last time. Denies chronic cough. Occasional wheezing if she congested. She is compliant with her home oxygen requiring 2L with exertion. Currently, she denies any current respiratory symptoms except for dyspnea on exertion which is baseline for her.  Interval  Two months ago her Rheumatologist decreased her chronic prednisone dose from 10 to 7.5 mg. She reports slightly worsening shortness of breath with walking and activity. Denies cough or wheezing. Still able to perform ADLs. She is compliant with her oxygen at rest but will take it off when walking. She takes her Spiriva every day but will only take her Symbicort once a day most days. She wears her CPAP nightly at least 6 hours.   Social History: Former smoker. Quit in 2003. 46 pack year history.  I have personally reviewed patient's past medical/family/social history, allergies, current medications.  Past Medical History:  Diagnosis Date  . Allergic rhinitis   . Asthma   . Asthmatic  bronchitis   . Colon polyps   . COPD (chronic obstructive pulmonary disease) ([place])   . Diverticulosis   . GERD (gastroesophageal reflux disease)   . H/O: GI bleed   . Hypercholesterolemia   . Migraine headache   . Obesity   . RLS (restless legs syndrome)     Outpatient Medications Prior to Visit  Medication Sig Dispense Refill  . acetaminophen (TYLENOL) 500 MG tablet Take 1,000 mg by mouth every 6 (six) hours as needed for moderate pain.    Marland Kitchen alendronate (FOSAMAX) 70 MG tablet     . Ascorbic Acid (VITAMIN C) 1000 MG tablet Take 1,000 mg by mouth daily.    Marland Kitchen aspirin 81 MG tablet Take 81 mg by mouth every other day.    . budesonide-formoterol (SYMBICORT) 160-4.5 MCG/ACT inhaler Inhale 2 puffs into the lungs 2 (two) times daily. 1 Inhaler 6  . Calcium Carbonate-Vitamin D (CALCIUM + D) 600-200 MG-UNIT TABS Take 1 tablet by mouth daily.      Marland Kitchen gabapentin (NEURONTIN) 300 MG capsule Take 300 mg by mouth at bedtime.    Marland Kitchen guaiFENesin (MUCINEX) 600 MG 12 hr tablet Take 600 mg by mouth daily.     . hydroxychloroquine (PLAQUENIL) 200 MG tablet Take 200 mg by mouth 2 (two) times daily.    . Leflunomide (ARAVA PO) Take 20 mg by mouth daily.    Marland Kitchen levalbuterol (XOPENEX) 1.25 MG/3ML nebulizer solution Take 1.25 mg by nebulization 3 (three) times daily. (Patient taking differently: Take 1.25 mg by nebulization 3 (three)  times daily as needed. ) 270 mL prn  . lisinopril (PRINIVIL,ZESTRIL) 5 MG tablet Take 5 mg by mouth daily.   6  . loratadine (CLARITIN) 10 MG tablet Take 10 mg by mouth daily.      Marland Kitchen LORazepam (ATIVAN) 0.5 MG tablet Take 1 tablet (0.5 mg total) by mouth 2 (two) times daily. 60 tablet 0  . meclizine (ANTIVERT) 25 MG tablet 1/2 to 1 tab every 6 hours as needed for dizzyness 50 tablet 2  . Omega-3 Fatty Acids (FISH OIL) 1200 MG CAPS Take 1 capsule by mouth daily.      Marland Kitchen omeprazole (PRILOSEC) 40 MG capsule Take 1 capsule by mouth daily.    . OXYGEN Inhale into the lungs. 2 lpm with rest and  exertion    . pramipexole (MIRAPEX) 0.5 MG tablet     . predniSONE (DELTASONE) 5 MG tablet Take 7.5 mg by mouth daily with breakfast.     . rOPINIRole (REQUIP) 0.25 MG tablet     . Tiotropium Bromide Monohydrate (SPIRIVA RESPIMAT) 1.25 MCG/ACT AERS Inhale 2 puffs into the lungs daily. 4 g 6  . traMADol (ULTRAM) 50 MG tablet Take 1/2 to 1 tablet TID with Extra Strength Tylenol 90 tablet 0  . valACYclovir (VALTREX) 1000 MG tablet Take 1,000 mg by mouth daily as needed (for breakouts).     . budesonide-formoterol (SYMBICORT) 160-4.5 MCG/ACT inhaler Inhale 2 puffs into the lungs 2 (two) times daily. 1 Inhaler 0  . levofloxacin (LEVAQUIN) 500 MG tablet Take 1 tablet (500 mg total) by mouth daily. 7 tablet 2  . Tiotropium Bromide Monohydrate (SPIRIVA RESPIMAT) 1.25 MCG/ACT AERS Inhale 2 puffs into the lungs daily. 1 Inhaler 0  . Tiotropium Bromide Monohydrate (SPIRIVA RESPIMAT) 1.25 MCG/ACT AERS Inhale 2 puffs into the lungs daily. 4 g 0   No facility-administered medications prior to visit.    Review of Systems  Constitutional: Negative for chills, diaphoresis, fever, malaise/fatigue and weight loss.  HENT: Negative for congestion.   Respiratory: Positive for shortness of breath. Negative for cough, hemoptysis, sputum production and wheezing.   Cardiovascular: Negative for chest pain, palpitations and leg swelling.    Objective:   Vitals:   06/01/19 1011  BP: 122/80  Pulse: (!) 117  Temp: (!) 97.2 F (36.2 C)  TempSrc: Temporal  SpO2: 94%  Weight: 284 lb 3.2 oz (128.9 kg)  Height: 5\' 7"  (1.702 m)   SpO2: 94 % O2 Device: Nasal cannula O2 Flow Rate (L/min): 2 L/min O2 Type: Pulse O2  Physical Exam: General: Well-appearing, no acute distress HENT: [place], AT, OP clear, MMM Eyes: EOMI, no scleral icterus Neuro: AAO x4, CNII-XII grossly intact Skin: Intact, no rashes or bruising Psych: Normal mood, normal affect  Data Reviewed:  Imaging: CT Chest 07/28/15 - multiple stable  pulmonary nodules, emphysema. Subpleural reticulation in lower lobes in the lung  PFT: 03/30/15 FVC 2.29 (75%) FEV1 1.61 (70%) Ratio 68  TLC 77% DLCO 50% Interpretation: Mild obstructive and restrictive lung disease with reduced gas exchange  Labs: CBC    Component Value Date/Time   WBC 14.7 (H) 03/13/2015 1107   RBC 4.12 03/13/2015 1107   HGB 12.6 03/13/2015 1107   HCT 38.7 03/13/2015 1107   PLT 341 03/13/2015 1107   MCV 93.9 03/13/2015 1107   MCH 30.6 03/13/2015 1107   MCHC 32.6 03/13/2015 1107   RDW 16.0 (H) 03/13/2015 1107   LYMPHSABS 3.2 02/06/2015 1129   MONOABS 1.7 (H) 02/06/2015 1129   EOSABS  0.3 02/06/2015 1129   BASOSABS 0.1 02/06/2015 1129      Assessment & Plan:   Discussion: 79 year old female former smoker with COPD/emphysema and RA-related ILD who presents for follow-up. Uncontrolled dypsnea after titrating steroids down. Will optimize inhaler regimen by increasing ICS and LAMA component. Will trial Trelegy for ease of use. Message sent to pharmacy team to determine cost. If too high, may need to switch to high dose Symbicort and Spiriva.  Chronic hypoxemic respiratory failure RA-related interstitial lung disease --STOP Symbicort and SPIRIVA --START Trelegy ONE puff ONCE a days --CONTINUE nebulizer as needed for shortness of breath or wheezing --Wear supplemental oxygen with ALL activity and rest. Goal level >88% --Please call our office if you develop signs and symptoms concerning for respiratory infection. Levaquin has worked well for you in the past and would be a reasonable antibiotic to consider in the future.  Health Maintenance Immunization History  Administered Date(s) Administered  . Influenza, High Dose Seasonal PF 01/17/2016, 01/14/2017, 12/25/2017, 01/11/2019  . Influenza-Unspecified 12/30/2014  . Moderna SARS-COVID-2 Vaccination 05/06/2019  . Pneumococcal Conjugate-13 07/07/2013  . Pneumococcal Polysaccharide-23 03/03/2009  . Zoster  Recombinat (Shingrix) 12/14/2016    No orders of the defined types were placed in this encounter.  Meds ordered this encounter  Medications  . Fluticasone-Umeclidin-Vilant (TRELEGY ELLIPTA) 200-62.5-25 MCG/INH AEPB    Sig: Inhale 1 puff into the lungs daily.    Dispense:  28 each    Refill:  0    Order Specific Question:   Lot Number?    Answer:   [id number]    Order Specific Question:   Expiration Date?    Answer:   09/13/2020    Order Specific Question:   Manufacturer?    Answer:   [employer] [[id number]]    Order Specific Question:   Quantity    Answer:   2  . Fluticasone-Umeclidin-Vilant (TRELEGY ELLIPTA) 200-62.5-25 MCG/INH AEPB    Sig: Inhale 1 puff into the lungs daily.    Dispense:  60 each    Refill:  6    Return in about 4 months (around 09/29/2019) for with NP.  [place], MD [facility] 06/01/2019 10:21 AM  Office Number [phone]

## 2019-06-25 DIAGNOSIS — M810 Age-related osteoporosis without current pathological fracture: Secondary | ICD-10-CM | POA: Diagnosis not present

## 2019-06-25 DIAGNOSIS — M170 Bilateral primary osteoarthritis of knee: Secondary | ICD-10-CM | POA: Diagnosis not present

## 2019-06-25 DIAGNOSIS — Z6841 Body Mass Index (BMI) 40.0 and over, adult: Secondary | ICD-10-CM | POA: Diagnosis not present

## 2019-06-25 DIAGNOSIS — J849 Interstitial pulmonary disease, unspecified: Secondary | ICD-10-CM | POA: Diagnosis not present

## 2019-06-25 DIAGNOSIS — M0579 Rheumatoid arthritis with rheumatoid factor of multiple sites without organ or systems involvement: Secondary | ICD-10-CM | POA: Diagnosis not present

## 2019-07-27 ENCOUNTER — Telehealth: Payer: Self-pay | Admitting: Pulmonary Disease

## 2019-07-27 MED ORDER — TRELEGY ELLIPTA 200-62.5-25 MCG/INH IN AEPB: 1.0000 | INHALATION_SPRAY | Freq: Every day | RESPIRATORY_TRACT | 6 refills | Status: DC

## 2019-07-27 NOTE — Telephone Encounter (Signed)
Called and spoke with patient. She stated that her PCP is trying to get her medications filled by [facility] so they can package all of her medications in a daily pouch for her. She stated that she does not need the Xopenex solution right now but she does need Trelegy.   I advised her that based on the last OV note, the prednisone RX will need to come from Dr. Trudie Reed as Dr. Loanne Drilling has not prescribed this for her.   She verbalized understanding. RX for Trelegy has been sent.

## 2019-07-29 DIAGNOSIS — I10 Essential (primary) hypertension: Secondary | ICD-10-CM | POA: Diagnosis not present

## 2019-07-29 DIAGNOSIS — E782 Mixed hyperlipidemia: Secondary | ICD-10-CM | POA: Diagnosis not present

## 2019-07-29 DIAGNOSIS — M05741 Rheumatoid arthritis with rheumatoid factor of right hand without organ or systems involvement: Secondary | ICD-10-CM | POA: Diagnosis not present

## 2019-07-29 DIAGNOSIS — J441 Chronic obstructive pulmonary disease with (acute) exacerbation: Secondary | ICD-10-CM | POA: Diagnosis not present

## 2019-07-29 DIAGNOSIS — J449 Chronic obstructive pulmonary disease, unspecified: Secondary | ICD-10-CM | POA: Diagnosis not present

## 2019-07-30 DIAGNOSIS — I700 Atherosclerosis of aorta: Secondary | ICD-10-CM | POA: Diagnosis not present

## 2019-07-30 DIAGNOSIS — E782 Mixed hyperlipidemia: Secondary | ICD-10-CM | POA: Diagnosis not present

## 2019-07-30 DIAGNOSIS — Z79899 Other long term (current) drug therapy: Secondary | ICD-10-CM | POA: Diagnosis not present

## 2019-07-30 DIAGNOSIS — G4733 Obstructive sleep apnea (adult) (pediatric): Secondary | ICD-10-CM | POA: Diagnosis not present

## 2019-07-30 DIAGNOSIS — J9611 Chronic respiratory failure with hypoxia: Secondary | ICD-10-CM | POA: Diagnosis not present

## 2019-07-30 DIAGNOSIS — M05741 Rheumatoid arthritis with rheumatoid factor of right hand without organ or systems involvement: Secondary | ICD-10-CM | POA: Diagnosis not present

## 2019-07-30 DIAGNOSIS — Z1389 Encounter for screening for other disorder: Secondary | ICD-10-CM | POA: Diagnosis not present

## 2019-07-30 DIAGNOSIS — I10 Essential (primary) hypertension: Secondary | ICD-10-CM | POA: Diagnosis not present

## 2019-07-30 DIAGNOSIS — Z0000 Encounter for general adult medical examination without abnormal findings: Secondary | ICD-10-CM | POA: Diagnosis not present

## 2019-07-30 DIAGNOSIS — J449 Chronic obstructive pulmonary disease, unspecified: Secondary | ICD-10-CM | POA: Diagnosis not present

## 2019-07-30 DIAGNOSIS — Z6841 Body Mass Index (BMI) 40.0 and over, adult: Secondary | ICD-10-CM | POA: Diagnosis not present

## 2019-09-10 DIAGNOSIS — E782 Mixed hyperlipidemia: Secondary | ICD-10-CM | POA: Diagnosis not present

## 2019-09-10 DIAGNOSIS — M05741 Rheumatoid arthritis with rheumatoid factor of right hand without organ or systems involvement: Secondary | ICD-10-CM | POA: Diagnosis not present

## 2019-09-10 DIAGNOSIS — J441 Chronic obstructive pulmonary disease with (acute) exacerbation: Secondary | ICD-10-CM | POA: Diagnosis not present

## 2019-09-10 DIAGNOSIS — J449 Chronic obstructive pulmonary disease, unspecified: Secondary | ICD-10-CM | POA: Diagnosis not present

## 2019-09-10 DIAGNOSIS — I10 Essential (primary) hypertension: Secondary | ICD-10-CM | POA: Diagnosis not present

## 2019-09-24 DIAGNOSIS — M0579 Rheumatoid arthritis with rheumatoid factor of multiple sites without organ or systems involvement: Secondary | ICD-10-CM | POA: Diagnosis not present

## 2019-09-24 DIAGNOSIS — M170 Bilateral primary osteoarthritis of knee: Secondary | ICD-10-CM | POA: Diagnosis not present

## 2019-09-24 DIAGNOSIS — Z6841 Body Mass Index (BMI) 40.0 and over, adult: Secondary | ICD-10-CM | POA: Diagnosis not present

## 2019-09-24 DIAGNOSIS — M810 Age-related osteoporosis without current pathological fracture: Secondary | ICD-10-CM | POA: Diagnosis not present

## 2019-09-24 DIAGNOSIS — J849 Interstitial pulmonary disease, unspecified: Secondary | ICD-10-CM | POA: Diagnosis not present

## 2019-10-01 ENCOUNTER — Ambulatory Visit: Payer: PPO | Admitting: Pulmonary Disease

## 2019-10-08 ENCOUNTER — Telehealth: Payer: Self-pay | Admitting: Pulmonary Disease

## 2019-10-08 ENCOUNTER — Ambulatory Visit: Payer: PPO | Admitting: Pulmonary Disease

## 2019-10-08 ENCOUNTER — Encounter: Payer: Self-pay | Admitting: Pulmonary Disease

## 2019-10-08 ENCOUNTER — Other Ambulatory Visit: Payer: Self-pay

## 2019-10-08 VITALS — BP 128/84 | HR 61 | Temp 98.3°F | Ht 67.0 in | Wt 278.2 lb

## 2019-10-08 DIAGNOSIS — R0602 Shortness of breath: Secondary | ICD-10-CM | POA: Diagnosis not present

## 2019-10-08 DIAGNOSIS — M0579 Rheumatoid arthritis with rheumatoid factor of multiple sites without organ or systems involvement: Secondary | ICD-10-CM

## 2019-10-08 DIAGNOSIS — J9611 Chronic respiratory failure with hypoxia: Secondary | ICD-10-CM | POA: Diagnosis not present

## 2019-10-08 DIAGNOSIS — J849 Interstitial pulmonary disease, unspecified: Secondary | ICD-10-CM

## 2019-10-08 DIAGNOSIS — J449 Chronic obstructive pulmonary disease, unspecified: Secondary | ICD-10-CM

## 2019-10-08 NOTE — Assessment & Plan Note (Signed)
Plan: Pulmonary function testing ordered High-resolution CT chest ordered

## 2019-10-08 NOTE — Assessment & Plan Note (Signed)
Plan: Walk today in office Continue Trelegy Ellipta

## 2019-10-08 NOTE — Telephone Encounter (Signed)
LVM for son [name] to return my call to schedule this CT

## 2019-10-08 NOTE — Assessment & Plan Note (Signed)
Plan: Continue follow-up with rheumatology Continue prednisone High-resolution CT chest ordered Pulmonary function testing ordered

## 2019-10-08 NOTE — Patient Instructions (Addendum)
You were seen today by Lauraine Rinne, NP  for:   1. COPD mixed type ([name])  Trelegy Ellipta  >>> 1 puff daily in the morning >>>rinse mouth out after use  >>> This inhaler contains 3 medications that help manage her respiratory status, contact our office if you cannot afford this medication or unable to remain on this medication  Note your daily symptoms > remember "red flags" for COPD:   >>>Increase in cough >>>increase in sputum production >>>increase in shortness of breath or activity  intolerance.   If you notice these symptoms, please call the office to be seen.   We will obtain pulmonary function testing to compare to your 2016 pulmonary function test  2. ILD (interstitial lung disease) ([place])  - CT Chest High Resolution; Future - Pulmonary function test; Future  3. Shortness of breath  - CT Chest High Resolution; Future  4. Chronic respiratory failure with hypoxia ([facility])  Walk today in office  Continue oxygen therapy as prescribed - 2L  >>>maintain oxygen saturations greater than 88 percent  >>>if unable to maintain oxygen saturations please contact the office  >>>do not smoke with oxygen  >>>can use nasal saline gel or nasal saline rinses to moisturize nose if oxygen causes dryness   5. Rheumatoid arthritis involving multiple sites with positive rheumatoid factor ([name])  Keep follow-up with rheumatology  Continue medications as managed by rheumatology   We recommend today:  Orders Placed This Encounter  Procedures  . CT Chest High Resolution    -High-res CT with supine and prone positioning -inspiratory and expiratory cuts.  -Only to be read by Dr. Rosario Jacks and Dr. Weber Cooks.    Standing Status:   Future    Standing Expiration Date:   10/07/2020    Scheduling Instructions:     Over next 4-6 weeks    Order Specific Question:   Preferred imaging location?    Answer:   [facility]    Order Specific Question:   Radiology Contrast Protocol - do NOT remove  file path    Answer:   [email or web address]  . Pulmonary function test    Standing Status:   Future    Standing Expiration Date:   10/07/2020    Order Specific Question:   Where should this test be performed?    Answer:   [facility]    Order Specific Question:   Full PFT: includes the following: basic spirometry, spirometry pre & post bronchodilator, diffusion capacity (DLCO), lung volumes    Answer:   Full PFT   Orders Placed This Encounter  Procedures  . CT Chest High Resolution  . Pulmonary function test   No orders of the defined types were placed in this encounter.   Follow Up:    Return in about 3 months (around 01/08/2020), or if symptoms worsen or fail to improve, for Follow up with Dr. Loanne Drilling, Follow up for FULL PFT - 60 min.   Please do your part to reduce the spread of COVID-19:      Reduce your risk of any infection  and COVID19 by using the similar precautions used for avoiding the common cold or flu:  Marland Kitchen Wash your hands often with soap and warm water for at least 20 seconds.  If soap and water are not readily available, use an alcohol-based hand sanitizer with at least 60% alcohol.  . If coughing or sneezing, cover your mouth and nose by coughing or sneezing into the elbow areas of your shirt  or coat, into a tissue or into your sleeve (not your hands). Langley Gauss A MASK when in public  . Avoid shaking hands with others and consider head nods or verbal greetings only. . Avoid touching your eyes, nose, or mouth with unwashed hands.  . Avoid close contact with people who are sick. . Avoid places or events with large numbers of people in one location, like concerts or sporting events. . If you have some symptoms but not all symptoms, continue to monitor at home and seek medical attention if your symptoms worsen. . If you are having a medical emergency, call 911.   [place] /  e-Visit: [email or web address]         [facility]: [facility]: [phone]                   [facility]: [phone]     It is flu season:   >>> Best ways to protect herself from the flu: Receive the yearly flu vaccine, practice good hand hygiene washing with soap and also using hand sanitizer when available, eat a nutritious meals, get adequate rest, hydrate appropriately   Please contact the office if your symptoms worsen or you have concerns that you are not improving.   Thank you for choosing [facility] for your healthcare, and for allowing [place] to partner with you on your healthcare journey. I am thankful to be able to provide care to you today.   Wyn Quaker FNP-C

## 2019-10-08 NOTE — Assessment & Plan Note (Signed)
Plan: Continue oxygen therapy Your walk today you required 2 L of O2 with physical exertion

## 2019-10-08 NOTE — Progress Notes (Signed)
@Patient  ID: [name], female    DOB: 03/29/1941, 79 y.o.   MRN: [id number]  Chief Complaint  Patient presents with  . Follow-up    pt needs to have a new ct scan for rheumatologist.pt gets sob when doing activities    Referring provider: Lajean Manes, MD  HPI:  79 year old female former smoker followed in our office COPD, RA related ILD, chronic respiratory failure  PMH: Rheumatoid arthritis Smoker/ Smoking History: Former smoker.  Quit 2003.  46-pack-year smoking history. Maintenance: Trelegy Ellipta Pt of: Dr. Loanne Drilling  10/08/2019  - Visit   79 year old female former smoker followed in our office for COPD, RA related ILD and chronic respiratory failure.  Patient presenting to office today as a follow-up visit.  She feels that the Trelegy Ellipta is working better for her.  The only drawback is she does not like the dry powder.  Patient reports she recently had a rheumatoid arthritis flare where rheumatology gave her an increased taper of prednisone.  She is still maintained on daily prednisone  She continues to have aspects of shortness of breath.  Walk today in office patient was able to complete 2 laps.  She did require 2 L of O2 with physical exertion.  Questionaires / Pulmonary Flowsheets:   ACT:  No flowsheet data found.  MMRC: No flowsheet data found.  Epworth:  No flowsheet data found.  Tests:   11/24/2017-chest x-ray-no acute cardiopulmonary abnormality, aortic arthrosclerosis  07/28/2015-CT chest without contrast-multiple pulmonary nodules are again noted and are stable dating back to 07/12/2013.  24 months of stability is compatible with a benign process.  Diffuse bronchial wall thickening and emphysema as above, aortic arthrosclerosis and coronary artery calcifications  03/30/2015-pulmonary function test-FVC 2.23 (73% predicted), postbronchodilator ratio 70, postbronchodilator FEV1 1.61 (70% predicted), no bronchodilator response, DLCO 13.5 (50%  predicted)  FENO:  No results found for: NITRICOXIDE  PFT: PFT Results Latest Ref Rng & Units 03/30/2015  FVC-Pre L 2.23  FVC-Predicted Pre % 73  FVC-Post L 2.29  FVC-Predicted Post % 75  Pre FEV1/FVC % % 68  Post FEV1/FCV % % 70  FEV1-Pre L 1.50  FEV1-Predicted Pre % 65  FEV1-Post L 1.61  DLCO UNC% % 50  DLCO COR %Predicted % 76  TLC L 4.13  TLC % Predicted % 77  RV % Predicted % 75    WALK:  SIX MIN WALK 10/08/2019 11/12/2016 03/14/2015 02/06/2015  Supplimental Oxygen during Test? (L/min) - Yes No No  O2 Flow Rate - 1 - -  Type - Continuous - -  Tech Comments: pt walked with the assitance of walker off oxygenpt oxygen didn't desaturate until 2nd lap dropped to 88 % pt pulse oxygen was bumped to 2 liters oxygen stats maintain after that oxygen level went to 92 % pt could not complete the 3rd lap due to leg fatigue - Pt only able to complete one lap due to SOB    Imaging: No results found.  Lab Results:  CBC    Component Value Date/Time   WBC 14.7 (H) 03/13/2015 1107   RBC 4.12 03/13/2015 1107   HGB 12.6 03/13/2015 1107   HCT 38.7 03/13/2015 1107   PLT 341 03/13/2015 1107   MCV 93.9 03/13/2015 1107   MCH 30.6 03/13/2015 1107   MCHC 32.6 03/13/2015 1107   RDW 16.0 (H) 03/13/2015 1107   LYMPHSABS 3.2 02/06/2015 1129   MONOABS 1.7 (H) 02/06/2015 1129   EOSABS 0.3 02/06/2015 1129   BASOSABS 0.1  02/06/2015 1129    BMET    Component Value Date/Time   NA 141 03/13/2015 1107   K 4.2 03/13/2015 1107   CL 105 03/13/2015 1107   CO2 24 03/13/2015 1107   GLUCOSE 146 (H) 03/13/2015 1107   BUN 12 03/13/2015 1107   CREATININE 0.71 03/13/2015 1107   CALCIUM 9.4 03/13/2015 1107   GFRNONAA >60 03/13/2015 1107   GFRAA >60 03/13/2015 1107    BNP No results found for: BNP  ProBNP No results found for: PROBNP  Specialty Problems      Pulmonary Problems   Cough   Dyspnea   ILD (interstitial lung disease) (HCC)   COPD mixed type (HCC)   Chronic respiratory  failure with hypoxia (HCC)      Allergies  Allergen Reactions  . Shellfish Allergy Anaphylaxis    With vomiting and diarrhea  . Macrolides And Ketolides   . Penicillins     Facial numbness  . Sertraline Hcl   . Sulfa Antibiotics     Facial numbness    Immunization History  Administered Date(s) Administered  . Influenza, High Dose Seasonal PF 01/17/2016, 01/14/2017, 12/25/2017, 01/11/2019  . Influenza-Unspecified 12/30/2014, 02/02/2019  . Moderna SARS-COVID-2 Vaccination 05/06/2019, 06/11/2019  . Pneumococcal Conjugate-13 07/07/2013  . Pneumococcal Polysaccharide-23 03/03/2009  . Zoster Recombinat (Shingrix) 12/14/2016    Past Medical History:  Diagnosis Date  . Allergic rhinitis   . Asthma   . Asthmatic bronchitis   . Colon polyps   . COPD (chronic obstructive pulmonary disease) ([place])   . Diverticulosis   . GERD (gastroesophageal reflux disease)   . H/O: GI bleed   . Hypercholesterolemia   . Migraine headache   . Obesity   . RLS (restless legs syndrome)     Tobacco History: Social History   Tobacco Use  Smoking Status Former Smoker  . Packs/day: 1.00  . Years: 46.00  . Pack years: 46.00  . Types: Cigarettes  . Start date: 46  . Quit date: 04/15/2001  . Years since quitting: 18.4  Smokeless Tobacco Never Used   Counseling given: Not Answered   Continue to not smoke  Outpatient Encounter Medications as of 10/08/2019  Medication Sig  . acetaminophen (TYLENOL) 500 MG tablet Take 1,000 mg by mouth every 6 (six) hours as needed for moderate pain.  Marland Kitchen alendronate (FOSAMAX) 70 MG tablet   . Ascorbic Acid (VITAMIN C) 1000 MG tablet Take 1,000 mg by mouth daily.  Marland Kitchen aspirin 81 MG tablet Take 81 mg by mouth every other day.  . Calcium Carbonate-Vitamin D (CALCIUM + D) 600-200 MG-UNIT TABS Take 1 tablet by mouth daily.    . Fluticasone-Umeclidin-Vilant (TRELEGY ELLIPTA) 200-62.5-25 MCG/INH AEPB Inhale 1 puff into the lungs daily.  . Fluticasone-Umeclidin-Vilant  (TRELEGY ELLIPTA) 200-62.5-25 MCG/INH AEPB Inhale 1 puff into the lungs daily.  Marland Kitchen guaiFENesin (MUCINEX) 600 MG 12 hr tablet Take 600 mg by mouth daily.   . hydroxychloroquine (PLAQUENIL) 200 MG tablet Take 200 mg by mouth 2 (two) times daily.  . Leflunomide (ARAVA PO) Take 20 mg by mouth daily.  Marland Kitchen levalbuterol (XOPENEX) 1.25 MG/3ML nebulizer solution Take 1.25 mg by nebulization 3 (three) times daily. (Patient taking differently: Take 1.25 mg by nebulization 3 (three) times daily as needed. )  . lisinopril (PRINIVIL,ZESTRIL) 5 MG tablet Take 5 mg by mouth daily.   Marland Kitchen loratadine (CLARITIN) 10 MG tablet Take 10 mg by mouth daily.    . meclizine (ANTIVERT) 25 MG tablet 1/2 to 1 tab every  6 hours as needed for dizzyness  . omeprazole (PRILOSEC) 40 MG capsule Take 1 capsule by mouth daily.  . OXYGEN Inhale into the lungs. 2 lpm with rest and exertion  . predniSONE (DELTASONE) 5 MG tablet Take 7.5 mg by mouth daily with breakfast.   . rOPINIRole (REQUIP) 0.25 MG tablet   . traMADol (ULTRAM) 50 MG tablet Take 1/2 to 1 tablet TID with Extra Strength Tylenol  . valACYclovir (VALTREX) 1000 MG tablet Take 1,000 mg by mouth daily as needed (for breakouts).   . [DISCONTINUED] gabapentin (NEURONTIN) 300 MG capsule Take 300 mg by mouth at bedtime.  . [DISCONTINUED] pramipexole (MIRAPEX) 0.5 MG tablet   . [DISCONTINUED] LORazepam (ATIVAN) 0.5 MG tablet Take 1 tablet (0.5 mg total) by mouth 2 (two) times daily.  . [DISCONTINUED] Omega-3 Fatty Acids (FISH OIL) 1200 MG CAPS Take 1 capsule by mouth daily.     No facility-administered encounter medications on file as of 10/08/2019.     Review of Systems  Review of Systems  Constitutional: Positive for fatigue. Negative for activity change and fever.  HENT: Negative for sinus pressure, sinus pain and sore throat.   Respiratory: Positive for shortness of breath. Negative for cough and wheezing.   Cardiovascular: Negative for chest pain and palpitations.    Gastrointestinal: Negative for diarrhea, nausea and vomiting.  Musculoskeletal: Negative for arthralgias.  Neurological: Negative for dizziness.  Psychiatric/Behavioral: Negative for sleep disturbance. The patient is not nervous/anxious.      Physical Exam  BP 128/84 (BP Location: Left Arm, Cuff Size: Normal)   Pulse 61   Temp 98.3 F (36.8 C) (Oral)   Ht 5\' 7"  (1.702 m)   Wt 278 lb 3.2 oz (126.2 kg)   SpO2 (!) 2% Comment: 2 liters of pulse oxygen  BMI 43.57 kg/m   Wt Readings from Last 5 Encounters:  10/08/19 278 lb 3.2 oz (126.2 kg)  06/01/19 284 lb 3.2 oz (128.9 kg)  01/26/19 280 lb 6.4 oz (127.2 kg)  02/24/18 274 lb 9.6 oz (124.6 kg)  11/24/17 272 lb 9.6 oz (123.7 kg)    BMI Readings from Last 5 Encounters:  10/08/19 43.57 kg/m  06/01/19 44.51 kg/m  01/26/19 45.26 kg/m  02/24/18 43.01 kg/m  11/24/17 42.70 kg/m     Physical Exam Vitals and nursing note reviewed.  Constitutional:      General: She is not in acute distress.    Appearance: Normal appearance. She is obese.  HENT:     Head: Normocephalic and atraumatic.     Right Ear: External ear normal.     Left Ear: External ear normal.     Nose: Nose normal. No congestion or rhinorrhea.     Mouth/Throat:     Mouth: Mucous membranes are moist.     Pharynx: Oropharynx is clear.  Eyes:     Pupils: Pupils are equal, round, and reactive to light.  Cardiovascular:     Rate and Rhythm: Normal rate and regular rhythm.     Pulses: Normal pulses.     Heart sounds: Normal heart sounds. No murmur heard.   Pulmonary:     Effort: Pulmonary effort is normal. No respiratory distress.     Breath sounds: Normal breath sounds. No decreased air movement. No decreased breath sounds, wheezing or rales.  Musculoskeletal:     Cervical back: Normal range of motion.  Skin:    General: Skin is warm and dry.     Capillary Refill: Capillary refill takes less than  2 seconds.  Neurological:     General: No focal deficit  present.     Mental Status: She is alert and oriented to person, place, and time. Mental status is at baseline.     Gait: Gait abnormal (walks with walker ).  Psychiatric:        Mood and Affect: Mood normal.        Behavior: Behavior normal.        Thought Content: Thought content normal.        Judgment: Judgment normal.       Assessment & Plan:   ILD (interstitial lung disease) ([place]) Plan: We will obtain pulmonary function testing We will obtain high-resolution CT imaging Walk today in office, patient tolerated 74-month follow-up with our office  COPD mixed type [facility]) Plan: Walk today in office Continue Trelegy Ellipta  Chronic respiratory failure with hypoxia ([place]) Plan: Continue oxygen therapy Your walk today you required 2 L of O2 with physical exertion  Rheumatoid arthritis ([place]) Plan: Continue follow-up with rheumatology Continue prednisone High-resolution CT chest ordered Pulmonary function testing ordered  Dyspnea Plan: Pulmonary function testing ordered High-resolution CT chest ordered    Return in about 3 months (around 01/08/2020), or if symptoms worsen or fail to improve, for Follow up with Dr. Loanne Drilling, Follow up for FULL PFT - 60 min.   Lauraine Rinne, NP 10/08/2019   This appointment required 32 minutes of patient care (this includes precharting, chart review, review of results, face-to-face care, etc.).

## 2019-10-08 NOTE — Assessment & Plan Note (Signed)
Plan: We will obtain pulmonary function testing We will obtain high-resolution CT imaging Walk today in office, patient tolerated 65-month follow-up with our office

## 2019-10-11 DIAGNOSIS — J449 Chronic obstructive pulmonary disease, unspecified: Secondary | ICD-10-CM | POA: Diagnosis not present

## 2019-10-11 DIAGNOSIS — E782 Mixed hyperlipidemia: Secondary | ICD-10-CM | POA: Diagnosis not present

## 2019-10-11 DIAGNOSIS — J441 Chronic obstructive pulmonary disease with (acute) exacerbation: Secondary | ICD-10-CM | POA: Diagnosis not present

## 2019-10-11 DIAGNOSIS — I10 Essential (primary) hypertension: Secondary | ICD-10-CM | POA: Diagnosis not present

## 2019-10-11 DIAGNOSIS — M05741 Rheumatoid arthritis with rheumatoid factor of right hand without organ or systems involvement: Secondary | ICD-10-CM | POA: Diagnosis not present

## 2019-10-11 NOTE — Telephone Encounter (Signed)
I have finally spoken with Shanon Brow the son and Mrs. Pardoe's CT has been scheduled at [facility] on 0723/2021 @ 10:00am arrival time 9:45am and he aware

## 2019-11-05 ENCOUNTER — Ambulatory Visit (HOSPITAL_COMMUNITY)
Admission: RE | Admit: 2019-11-05 | Discharge: 2019-11-05 | Disposition: A | Discharge status: Home / Self Care | Payer: PPO | Source: Ambulatory Visit | Attending: Pulmonary Disease | Admitting: Pulmonary Disease

## 2019-11-05 ENCOUNTER — Other Ambulatory Visit: Payer: Self-pay

## 2019-11-05 DIAGNOSIS — R0602 Shortness of breath: Secondary | ICD-10-CM | POA: Insufficient documentation

## 2019-11-05 DIAGNOSIS — I700 Atherosclerosis of aorta: Secondary | ICD-10-CM | POA: Diagnosis not present

## 2019-11-05 DIAGNOSIS — M05741 Rheumatoid arthritis with rheumatoid factor of right hand without organ or systems involvement: Secondary | ICD-10-CM | POA: Diagnosis not present

## 2019-11-05 DIAGNOSIS — J438 Other emphysema: Secondary | ICD-10-CM | POA: Diagnosis not present

## 2019-11-05 DIAGNOSIS — I2510 Atherosclerotic heart disease of native coronary artery without angina pectoris: Secondary | ICD-10-CM | POA: Diagnosis not present

## 2019-11-05 DIAGNOSIS — J849 Interstitial pulmonary disease, unspecified: Secondary | ICD-10-CM | POA: Diagnosis not present

## 2019-11-05 DIAGNOSIS — J8410 Pulmonary fibrosis, unspecified: Secondary | ICD-10-CM | POA: Diagnosis not present

## 2019-11-05 DIAGNOSIS — J441 Chronic obstructive pulmonary disease with (acute) exacerbation: Secondary | ICD-10-CM | POA: Diagnosis not present

## 2019-11-05 DIAGNOSIS — E782 Mixed hyperlipidemia: Secondary | ICD-10-CM | POA: Diagnosis not present

## 2019-11-05 DIAGNOSIS — J449 Chronic obstructive pulmonary disease, unspecified: Secondary | ICD-10-CM | POA: Diagnosis not present

## 2019-11-05 DIAGNOSIS — I10 Essential (primary) hypertension: Secondary | ICD-10-CM | POA: Diagnosis not present

## 2019-11-05 NOTE — Progress Notes (Signed)
Tried calling the pt and her line was busy. WCB.

## 2019-11-19 ENCOUNTER — Other Ambulatory Visit: Payer: Self-pay

## 2019-11-19 ENCOUNTER — Ambulatory Visit (INDEPENDENT_AMBULATORY_CARE_PROVIDER_SITE_OTHER): Payer: PPO | Admitting: Pulmonary Disease

## 2019-11-19 DIAGNOSIS — J849 Interstitial pulmonary disease, unspecified: Secondary | ICD-10-CM

## 2019-11-19 LAB — PULMONARY FUNCTION TEST
DL/VA % pred: 106 %
DL/VA: 4.27 ml/min/mmHg/L
DLCO cor % pred: 68 %
DLCO cor: 13.84 ml/min/mmHg
DLCO unc % pred: 68 %
DLCO unc: 13.84 ml/min/mmHg
FEF 25-75 Post: 1.08 L/sec
FEF 25-75 Pre: 0.67 L/sec
FEF2575-%Change-Post: 61 %
FEF2575-%Pred-Post: 68 %
FEF2575-%Pred-Pre: 42 %
FEV1-%Change-Post: 19 %
FEV1-%Pred-Post: 66 %
FEV1-%Pred-Pre: 55 %
FEV1-Post: 1.44 L
FEV1-Pre: 1.2 L
FEV1FVC-%Change-Post: 8 %
FEV1FVC-%Pred-Pre: 85 %
FEV6-%Change-Post: 10 %
FEV6-%Pred-Post: 76 %
FEV6-%Pred-Pre: 69 %
FEV6-Post: 2.1 L
FEV6-Pre: 1.91 L
FEV6FVC-%Pred-Post: 105 %
FEV6FVC-%Pred-Pre: 105 %
FVC-%Change-Post: 10 %
FVC-%Pred-Post: 72 %
FVC-%Pred-Pre: 65 %
FVC-Post: 2.1 L
FVC-Pre: 1.91 L
Post FEV1/FVC ratio: 69 %
Post FEV6/FVC ratio: 100 %
Pre FEV1/FVC ratio: 63 %
Pre FEV6/FVC Ratio: 100 %
RV % pred: 86 %
RV: 2.13 L
TLC % pred: 77 %
TLC: 4.16 L

## 2019-11-19 NOTE — Progress Notes (Signed)
Full PFT performed today. °

## 2019-12-02 DIAGNOSIS — J441 Chronic obstructive pulmonary disease with (acute) exacerbation: Secondary | ICD-10-CM | POA: Diagnosis not present

## 2019-12-02 DIAGNOSIS — M05741 Rheumatoid arthritis with rheumatoid factor of right hand without organ or systems involvement: Secondary | ICD-10-CM | POA: Diagnosis not present

## 2019-12-02 DIAGNOSIS — J449 Chronic obstructive pulmonary disease, unspecified: Secondary | ICD-10-CM | POA: Diagnosis not present

## 2019-12-02 DIAGNOSIS — I10 Essential (primary) hypertension: Secondary | ICD-10-CM | POA: Diagnosis not present

## 2019-12-02 DIAGNOSIS — E782 Mixed hyperlipidemia: Secondary | ICD-10-CM | POA: Diagnosis not present

## 2019-12-22 ENCOUNTER — Ambulatory Visit: Payer: PPO | Admitting: Pulmonary Disease

## 2020-01-03 DIAGNOSIS — J441 Chronic obstructive pulmonary disease with (acute) exacerbation: Secondary | ICD-10-CM | POA: Diagnosis not present

## 2020-01-03 DIAGNOSIS — J449 Chronic obstructive pulmonary disease, unspecified: Secondary | ICD-10-CM | POA: Diagnosis not present

## 2020-01-03 DIAGNOSIS — I10 Essential (primary) hypertension: Secondary | ICD-10-CM | POA: Diagnosis not present

## 2020-01-03 DIAGNOSIS — E782 Mixed hyperlipidemia: Secondary | ICD-10-CM | POA: Diagnosis not present

## 2020-01-03 DIAGNOSIS — M05741 Rheumatoid arthritis with rheumatoid factor of right hand without organ or systems involvement: Secondary | ICD-10-CM | POA: Diagnosis not present

## 2020-01-07 DIAGNOSIS — Z79899 Other long term (current) drug therapy: Secondary | ICD-10-CM | POA: Diagnosis not present

## 2020-01-07 DIAGNOSIS — M810 Age-related osteoporosis without current pathological fracture: Secondary | ICD-10-CM | POA: Diagnosis not present

## 2020-01-07 DIAGNOSIS — M0579 Rheumatoid arthritis with rheumatoid factor of multiple sites without organ or systems involvement: Secondary | ICD-10-CM | POA: Diagnosis not present

## 2020-01-07 DIAGNOSIS — Z6841 Body Mass Index (BMI) 40.0 and over, adult: Secondary | ICD-10-CM | POA: Diagnosis not present

## 2020-01-07 DIAGNOSIS — J849 Interstitial pulmonary disease, unspecified: Secondary | ICD-10-CM | POA: Diagnosis not present

## 2020-01-07 DIAGNOSIS — M170 Bilateral primary osteoarthritis of knee: Secondary | ICD-10-CM | POA: Diagnosis not present

## 2020-01-25 DIAGNOSIS — J449 Chronic obstructive pulmonary disease, unspecified: Secondary | ICD-10-CM | POA: Diagnosis not present

## 2020-01-25 DIAGNOSIS — J9611 Chronic respiratory failure with hypoxia: Secondary | ICD-10-CM | POA: Diagnosis not present

## 2020-01-25 DIAGNOSIS — I10 Essential (primary) hypertension: Secondary | ICD-10-CM | POA: Diagnosis not present

## 2020-01-27 ENCOUNTER — Other Ambulatory Visit: Payer: Self-pay

## 2020-01-27 ENCOUNTER — Ambulatory Visit: Payer: PPO | Admitting: Pulmonary Disease

## 2020-01-27 ENCOUNTER — Encounter: Payer: Self-pay | Admitting: Pulmonary Disease

## 2020-01-27 VITALS — BP 124/62 | HR 110 | Temp 97.5°F | Ht 67.0 in | Wt 288.6 lb

## 2020-01-27 DIAGNOSIS — Z9989 Dependence on other enabling machines and devices: Secondary | ICD-10-CM

## 2020-01-27 DIAGNOSIS — J9611 Chronic respiratory failure with hypoxia: Secondary | ICD-10-CM | POA: Diagnosis not present

## 2020-01-27 DIAGNOSIS — G4733 Obstructive sleep apnea (adult) (pediatric): Secondary | ICD-10-CM | POA: Diagnosis not present

## 2020-01-27 DIAGNOSIS — J449 Chronic obstructive pulmonary disease, unspecified: Secondary | ICD-10-CM

## 2020-01-27 DIAGNOSIS — J849 Interstitial pulmonary disease, unspecified: Secondary | ICD-10-CM

## 2020-01-27 MED ORDER — LEVOFLOXACIN 500 MG PO TABS
500.0000 mg | ORAL_TABLET | Freq: Every day | ORAL | 0 refills | Status: DC
Start: 2020-01-27 — End: 2020-06-11

## 2020-01-27 MED ORDER — TRELEGY ELLIPTA 200-62.5-25 MCG/INH IN AEPB: 1.0000 | INHALATION_SPRAY | Freq: Every day | RESPIRATORY_TRACT | 4 refills | Status: DC

## 2020-01-27 NOTE — Progress Notes (Signed)
Subjective:   PATIENT ID: [name] GENDER: female DOB: 1940/11/18, MRN: [id number]   HPI  Chief Complaint  Patient presents with  . Follow-up    symptoms are better, thinks Trelegy is causing a cough    Reason for Visit: Follow-up   [name] is a 79 year old female former smoker with COPD/emphysema and RA-related ILD who presents for follow-up.  Her son is present.  For #1 type home apneas  She is a former patient of Dr. Lenna Gilford. She continues on prednisone 7.5 mg, HCQ and Leflunomide for RA and ILD which is managed by her Rheumatologist Dr. Trudie Reed.   11/27/19 Since our last visit, she reports the Trelegy has improved her overall breathing and energy. Denies wheezing. She still gets short of breath with stairs and standing for prolonged periods. She reports that she will occasionally cough and sneeze after taking the powder inhaler. She is compliant with her home oxygen and monitors her O2 levels with nadir of 93% seen. She is also compliant with her CPAP which she wears nightly up to 9 hours.  Social History: Former smoker. Quit in 2003. 46 pack year history.  I have personally reviewed patient's past medical/family/social history, allergies and current medications.  Past Medical History:  Diagnosis Date  . Allergic rhinitis   . Asthma   . Asthmatic bronchitis   . Colon polyps   . COPD (chronic obstructive pulmonary disease) ([place])   . Diverticulosis   . GERD (gastroesophageal reflux disease)   . H/O: GI bleed   . Hypercholesterolemia   . Migraine headache   . Obesity   . RLS (restless legs syndrome)     Outpatient Medications Prior to Visit  Medication Sig Dispense Refill  . acetaminophen (TYLENOL) 500 MG tablet Take 1,000 mg by mouth every 6 (six) hours as needed for moderate pain.    Marland Kitchen alendronate (FOSAMAX) 70 MG tablet     . Ascorbic Acid (VITAMIN C) 1000 MG tablet Take 1,000 mg by mouth daily.    Marland Kitchen aspirin 81 MG tablet Take 81 mg by mouth every other day.     . Calcium Carbonate-Vitamin D (CALCIUM + D) 600-200 MG-UNIT TABS Take 1 tablet by mouth daily.      . Fluticasone-Umeclidin-Vilant (TRELEGY ELLIPTA) 200-62.5-25 MCG/INH AEPB Inhale 1 puff into the lungs daily. 28 each 0  . guaiFENesin (MUCINEX) 600 MG 12 hr tablet Take 600 mg by mouth daily.     . hydroxychloroquine (PLAQUENIL) 200 MG tablet Take 200 mg by mouth 2 (two) times daily.    . Leflunomide (ARAVA PO) Take 20 mg by mouth daily.    Marland Kitchen levalbuterol (XOPENEX) 1.25 MG/3ML nebulizer solution Take 1.25 mg by nebulization 3 (three) times daily. (Patient taking differently: Take 1.25 mg by nebulization 3 (three) times daily as needed. ) 270 mL prn  . lisinopril (PRINIVIL,ZESTRIL) 5 MG tablet Take 5 mg by mouth daily.   6  . loratadine (CLARITIN) 10 MG tablet Take 10 mg by mouth daily.      . meclizine (ANTIVERT) 25 MG tablet 1/2 to 1 tab every 6 hours as needed for dizzyness 50 tablet 2  . omeprazole (PRILOSEC) 40 MG capsule Take 1 capsule by mouth daily.    . OXYGEN Inhale into the lungs. 2 lpm with rest and exertion    . predniSONE (DELTASONE) 5 MG tablet Take 7.5 mg by mouth daily with breakfast.     . rOPINIRole (REQUIP) 0.25 MG tablet     .  traMADol (ULTRAM) 50 MG tablet Take 1/2 to 1 tablet TID with Extra Strength Tylenol 90 tablet 0  . valACYclovir (VALTREX) 1000 MG tablet Take 1,000 mg by mouth daily as needed (for breakouts).     . Fluticasone-Umeclidin-Vilant (TRELEGY ELLIPTA) 200-62.5-25 MCG/INH AEPB Inhale 1 puff into the lungs daily. 60 each 6   No facility-administered medications prior to visit.    Review of Systems  Constitutional: Negative for chills, diaphoresis, fever, malaise/fatigue and weight loss.  HENT: Negative for congestion.   Respiratory: Positive for shortness of breath. Negative for cough, hemoptysis, sputum production and wheezing.   Cardiovascular: Negative for chest pain, palpitations and leg swelling.    Objective:   Vitals:   01/27/20 0856  BP:  124/62  Pulse: (!) 110  Temp: (!) 97.5 F (36.4 C)  TempSrc: Temporal  SpO2: 95%  Weight: 288 lb 9.6 oz (130.9 kg)  Height: 5\' 7"  (1.702 m)   SpO2: 95 % O2 Device: Nasal cannula O2 Flow Rate (L/min): 1 L/min O2 Type: Pulse O2  Physical Exam: General: Well-appearing, no acute distress HENT: , AT Eyes: EOMI, no scleral icterus Respiratory: Clear to auscultation bilaterally.  No crackles, wheezing or rales Cardiovascular: RRR, -M/R/G, no JVD Extremities:-Edema,-tenderness Neuro: AAO x4, CNII-XII grossly intact Psych: Normal mood, normal affect  Data Reviewed:  Imaging: CT Chest 07/28/15 - multiple stable pulmonary nodules, emphysema. Subpleural reticulation in lower lobes in the lung CT Chest 11/05/19 - UIP pattern with basilar predominance of patchy subpleural reticulation with minimal honeycombing. Slightly progressed compared to 2017 chest imaging. Unchanged pulmonary nodules likely benign.  PFT: 03/30/15 FVC 2.29 (75%) FEV1 1.61 (70%) Ratio 68  TLC 77% DLCO 50% Interpretation: Mild obstructive and restrictive lung disease with reduced gas exchange  01/27/20 FVC 2.10 (72%) FEV1 1.44 (66%) Ratio 63  TLC 77% DLCO 68% Interpretation: Moderate obstructive and restrictive lund disease with improved gas exchange. +BD response. Compared to 2016 PFTs, progression that is not significant (<10%)  Labs: CBC    Component Value Date/Time   WBC 14.7 (H) 03/13/2015 1107   RBC 4.12 03/13/2015 1107   HGB 12.6 03/13/2015 1107   HCT 38.7 03/13/2015 1107   PLT 341 03/13/2015 1107   MCV 93.9 03/13/2015 1107   MCH 30.6 03/13/2015 1107   MCHC 32.6 03/13/2015 1107   RDW 16.0 (H) 03/13/2015 1107   LYMPHSABS 3.2 02/06/2015 1129   MONOABS 1.7 (H) 02/06/2015 1129   EOSABS 0.3 02/06/2015 1129   BASOSABS 0.1 02/06/2015 1129   Imaging, labs and test noted above have been reviewed independently by me.  Assessment & Plan:   Discussion: 79 year old female former smoker with COPD/emphysema  and RA-related ILD who presents for follow-up.  Compliant and tolerating current therapy with good control. Discussed and reviewed PFTs and CT Chest in detail with patient and son - stable lung disease. Patient inquired about benefit of switching to [facility] fromTrelegy as the powder causes her to cough. Addressed questions and after discussion she wishes to remain on Trelegy for the convenience of daily dosing.  Chronic hypoxemic respiratory failure RA-related interstitial lung disease --CONTINUE Trelegy ONE puff ONCE a days --CONTINUE nebulizer as needed for shortness of breath or wheezing --Wear supplemental oxygen with ALL activity and rest. Goal level >88% --Please call our office if you develop signs and symptoms concerning for respiratory infection. Levaquin has worked well for you in the past and would be a reasonable antibiotic to consider in the future. (This has been ordered for future use as  needed. Please call office when taken.)  OSA on CPAP --Request compliance report --Advised patient to wear CPAP for at least 4 hours each night for greater than 70% of the time to avoid the machine being repossessed by insurance.   Health Maintenance Immunization History  Administered Date(s) Administered  . Influenza, High Dose Seasonal PF 01/17/2016, 01/14/2017, 12/25/2017, 01/11/2019, 01/08/2020  . Influenza-Unspecified 12/30/2014, 02/02/2019  . Moderna SARS-COVID-2 Vaccination 05/06/2019, 06/11/2019  . Pneumococcal Conjugate-13 07/07/2013  . Pneumococcal Polysaccharide-23 03/03/2009  . Zoster Recombinat (Shingrix) 12/14/2016    No orders of the defined types were placed in this encounter.  Meds ordered this encounter  Medications  . Fluticasone-Umeclidin-Vilant (TRELEGY ELLIPTA) 200-62.5-25 MCG/INH AEPB    Sig: Inhale 1 puff into the lungs daily.    Dispense:  90 each    Refill:  4    Dispense 3 month supply  . levofloxacin (LEVAQUIN) 500 MG tablet    Sig: Take 1 tablet (500 mg  total) by mouth daily. Please call Pulm office when you take it [phone]    Dispense:  7 tablet    Refill:  0   Return in about 6 months (around 07/27/2020).  I have spent a total time of 31-minutes on the day of the appointment reviewing prior documentation, coordinating care and discussing medical diagnosis and plan with the patient/family. Imaging, labs and tests included in this note have been reviewed and interpreted independently by me.  [place], MD [place] [facility] 01/27/2020 1:54 PM  Office Number [phone]

## 2020-01-27 NOTE — Patient Instructions (Signed)
Chronic hypoxemic respiratory failure RA-related interstitial lung disease --CONTINUE Trelegy ONE puff ONCE a days --CONTINUE nebulizer as needed for shortness of breath or wheezing --Wear supplemental oxygen with ALL activity and rest. Goal level >88% --Please call our office if you develop signs and symptoms concerning for respiratory infection. Levaquin has worked well for you in the past and would be a reasonable antibiotic to consider in the future. (This has been ordered for future use as needed. Please call office when taken.)

## 2020-02-02 DIAGNOSIS — J449 Chronic obstructive pulmonary disease, unspecified: Secondary | ICD-10-CM | POA: Diagnosis not present

## 2020-02-02 DIAGNOSIS — J441 Chronic obstructive pulmonary disease with (acute) exacerbation: Secondary | ICD-10-CM | POA: Diagnosis not present

## 2020-02-02 DIAGNOSIS — I10 Essential (primary) hypertension: Secondary | ICD-10-CM | POA: Diagnosis not present

## 2020-02-02 DIAGNOSIS — M05741 Rheumatoid arthritis with rheumatoid factor of right hand without organ or systems involvement: Secondary | ICD-10-CM | POA: Diagnosis not present

## 2020-02-02 DIAGNOSIS — E782 Mixed hyperlipidemia: Secondary | ICD-10-CM | POA: Diagnosis not present

## 2020-02-07 ENCOUNTER — Other Ambulatory Visit: Payer: Self-pay | Admitting: Geriatric Medicine

## 2020-02-07 DIAGNOSIS — Z1231 Encounter for screening mammogram for malignant neoplasm of breast: Secondary | ICD-10-CM

## 2020-02-28 DIAGNOSIS — J441 Chronic obstructive pulmonary disease with (acute) exacerbation: Secondary | ICD-10-CM | POA: Diagnosis not present

## 2020-02-28 DIAGNOSIS — E782 Mixed hyperlipidemia: Secondary | ICD-10-CM | POA: Diagnosis not present

## 2020-02-28 DIAGNOSIS — J449 Chronic obstructive pulmonary disease, unspecified: Secondary | ICD-10-CM | POA: Diagnosis not present

## 2020-02-28 DIAGNOSIS — M05741 Rheumatoid arthritis with rheumatoid factor of right hand without organ or systems involvement: Secondary | ICD-10-CM | POA: Diagnosis not present

## 2020-02-28 DIAGNOSIS — I10 Essential (primary) hypertension: Secondary | ICD-10-CM | POA: Diagnosis not present

## 2020-03-03 DIAGNOSIS — G4733 Obstructive sleep apnea (adult) (pediatric): Secondary | ICD-10-CM | POA: Diagnosis not present

## 2020-03-03 DIAGNOSIS — J449 Chronic obstructive pulmonary disease, unspecified: Secondary | ICD-10-CM | POA: Diagnosis not present

## 2020-03-03 DIAGNOSIS — R063 Periodic breathing: Secondary | ICD-10-CM | POA: Diagnosis not present

## 2020-03-24 ENCOUNTER — Other Ambulatory Visit: Payer: Self-pay

## 2020-03-24 ENCOUNTER — Ambulatory Visit
Admission: RE | Admit: 2020-03-24 | Discharge: 2020-03-24 | Disposition: A | Discharge status: Home / Self Care | Payer: PPO | Source: Ambulatory Visit | Attending: Geriatric Medicine | Admitting: Geriatric Medicine

## 2020-03-24 DIAGNOSIS — Z1231 Encounter for screening mammogram for malignant neoplasm of breast: Secondary | ICD-10-CM | POA: Diagnosis not present

## 2020-03-24 DIAGNOSIS — Z961 Presence of intraocular lens: Secondary | ICD-10-CM | POA: Diagnosis not present

## 2020-03-24 DIAGNOSIS — H524 Presbyopia: Secondary | ICD-10-CM | POA: Diagnosis not present

## 2020-03-24 DIAGNOSIS — Z79899 Other long term (current) drug therapy: Secondary | ICD-10-CM | POA: Diagnosis not present

## 2020-04-03 DIAGNOSIS — M05741 Rheumatoid arthritis with rheumatoid factor of right hand without organ or systems involvement: Secondary | ICD-10-CM | POA: Diagnosis not present

## 2020-04-03 DIAGNOSIS — I10 Essential (primary) hypertension: Secondary | ICD-10-CM | POA: Diagnosis not present

## 2020-04-03 DIAGNOSIS — E782 Mixed hyperlipidemia: Secondary | ICD-10-CM | POA: Diagnosis not present

## 2020-04-03 DIAGNOSIS — J441 Chronic obstructive pulmonary disease with (acute) exacerbation: Secondary | ICD-10-CM | POA: Diagnosis not present

## 2020-04-03 DIAGNOSIS — J449 Chronic obstructive pulmonary disease, unspecified: Secondary | ICD-10-CM | POA: Diagnosis not present

## 2020-04-28 DIAGNOSIS — Z6841 Body Mass Index (BMI) 40.0 and over, adult: Secondary | ICD-10-CM | POA: Diagnosis not present

## 2020-04-28 DIAGNOSIS — M170 Bilateral primary osteoarthritis of knee: Secondary | ICD-10-CM | POA: Diagnosis not present

## 2020-04-28 DIAGNOSIS — J849 Interstitial pulmonary disease, unspecified: Secondary | ICD-10-CM | POA: Diagnosis not present

## 2020-04-28 DIAGNOSIS — M810 Age-related osteoporosis without current pathological fracture: Secondary | ICD-10-CM | POA: Diagnosis not present

## 2020-04-28 DIAGNOSIS — M0579 Rheumatoid arthritis with rheumatoid factor of multiple sites without organ or systems involvement: Secondary | ICD-10-CM | POA: Diagnosis not present

## 2020-04-28 DIAGNOSIS — Z79899 Other long term (current) drug therapy: Secondary | ICD-10-CM | POA: Diagnosis not present

## 2020-05-03 DIAGNOSIS — J449 Chronic obstructive pulmonary disease, unspecified: Secondary | ICD-10-CM | POA: Diagnosis not present

## 2020-05-03 DIAGNOSIS — I10 Essential (primary) hypertension: Secondary | ICD-10-CM | POA: Diagnosis not present

## 2020-05-03 DIAGNOSIS — J441 Chronic obstructive pulmonary disease with (acute) exacerbation: Secondary | ICD-10-CM | POA: Diagnosis not present

## 2020-05-03 DIAGNOSIS — E782 Mixed hyperlipidemia: Secondary | ICD-10-CM | POA: Diagnosis not present

## 2020-05-03 DIAGNOSIS — M05741 Rheumatoid arthritis with rheumatoid factor of right hand without organ or systems involvement: Secondary | ICD-10-CM | POA: Diagnosis not present

## 2020-05-08 DIAGNOSIS — D649 Anemia, unspecified: Secondary | ICD-10-CM | POA: Diagnosis not present

## 2020-05-10 ENCOUNTER — Telehealth: Payer: Self-pay | Admitting: Pulmonary Disease

## 2020-05-10 NOTE — Telephone Encounter (Signed)
Called and spoke with pt and she is aware of last OV note and PFT has been faxed to [facility] per their request.

## 2020-06-02 DIAGNOSIS — J449 Chronic obstructive pulmonary disease, unspecified: Secondary | ICD-10-CM | POA: Diagnosis not present

## 2020-06-02 DIAGNOSIS — J441 Chronic obstructive pulmonary disease with (acute) exacerbation: Secondary | ICD-10-CM | POA: Diagnosis not present

## 2020-06-02 DIAGNOSIS — E782 Mixed hyperlipidemia: Secondary | ICD-10-CM | POA: Diagnosis not present

## 2020-06-02 DIAGNOSIS — M05741 Rheumatoid arthritis with rheumatoid factor of right hand without organ or systems involvement: Secondary | ICD-10-CM | POA: Diagnosis not present

## 2020-06-02 DIAGNOSIS — I10 Essential (primary) hypertension: Secondary | ICD-10-CM | POA: Diagnosis not present

## 2020-06-09 DIAGNOSIS — U071 COVID-19: Secondary | ICD-10-CM | POA: Diagnosis not present

## 2020-06-10 ENCOUNTER — Other Ambulatory Visit: Payer: Self-pay | Admitting: Adult Health

## 2020-06-10 NOTE — Progress Notes (Signed)
I connected by phone with [name] on 06/10/2020 at 12:52 PM to discuss the potential use of a new treatment for mild to moderate COVID-19 viral infection in non-hospitalized patients.  This patient is a 80 y.o. female that meets the FDA criteria for Emergency Use Authorization of COVID monoclonal antibody sotrovimab.  Has a (+) direct SARS-CoV-2 viral test result  Has mild or moderate COVID-19   Is NOT hospitalized due to COVID-19  Is within 10 days of symptom onset  Has at least one of the high risk factor(s) for progression to severe COVID-19 and/or hospitalization as defined in EUA.  Specific high risk criteria : Immunosuppressive Disease or Treatment   I have spoken and communicated the following to the patient or parent/caregiver regarding COVID monoclonal antibody treatment:  1. FDA has authorized the emergency use for the treatment of mild to moderate COVID-19 in adults and pediatric patients with positive results of direct SARS-CoV-2 viral testing who are 70 years of age and older weighing at least 40 kg, and who are at high risk for progressing to severe COVID-19 and/or hospitalization.  2. The significant known and potential risks and benefits of COVID monoclonal antibody, and the extent to which such potential risks and benefits are unknown.  3. Information on available alternative treatments and the risks and benefits of those alternatives, including clinical trials.  4. Patients treated with COVID monoclonal antibody should continue to self-isolate and use infection control measures (e.g., wear mask, isolate, social distance, avoid sharing personal items, clean and disinfect "high touch" surfaces, and frequent handwashing) according to CDC guidelines.   5. The patient or parent/caregiver has the option to accept or refuse COVID monoclonal antibody treatment.  After reviewing this information with the patient, the patient has agreed to receive one of the available covid 19  monoclonal antibodies and will be provided an appropriate fact sheet prior to infusion.  Sx 2/21 , Home test + 2/22 , Vaccinated x 3 , Immunosuppressed   Kush Farabee, NP 06/10/2020 12:52 PM

## 2020-06-11 ENCOUNTER — Emergency Department (HOSPITAL_COMMUNITY): Payer: PPO

## 2020-06-11 ENCOUNTER — Encounter: Payer: Self-pay | Admitting: Infectious Diseases

## 2020-06-11 ENCOUNTER — Other Ambulatory Visit: Payer: Self-pay

## 2020-06-11 ENCOUNTER — Encounter (HOSPITAL_COMMUNITY): Payer: Self-pay | Admitting: Emergency Medicine

## 2020-06-11 ENCOUNTER — Inpatient Hospital Stay (HOSPITAL_COMMUNITY)
Admission: EM | Admit: 2020-06-11 | Discharge: 2020-06-15 | DRG: 178 | Disposition: A | Discharge status: Skilled Nursing Facility | Payer: PPO | Attending: Internal Medicine | Admitting: Internal Medicine

## 2020-06-11 DIAGNOSIS — Z7983 Long term (current) use of bisphosphonates: Secondary | ICD-10-CM | POA: Diagnosis not present

## 2020-06-11 DIAGNOSIS — R001 Bradycardia, unspecified: Secondary | ICD-10-CM | POA: Diagnosis not present

## 2020-06-11 DIAGNOSIS — I959 Hypotension, unspecified: Secondary | ICD-10-CM | POA: Diagnosis not present

## 2020-06-11 DIAGNOSIS — E785 Hyperlipidemia, unspecified: Secondary | ICD-10-CM | POA: Diagnosis present

## 2020-06-11 DIAGNOSIS — M5126 Other intervertebral disc displacement, lumbar region: Secondary | ICD-10-CM | POA: Diagnosis not present

## 2020-06-11 DIAGNOSIS — K219 Gastro-esophageal reflux disease without esophagitis: Secondary | ICD-10-CM | POA: Diagnosis not present

## 2020-06-11 DIAGNOSIS — Z7951 Long term (current) use of inhaled steroids: Secondary | ICD-10-CM

## 2020-06-11 DIAGNOSIS — Z9981 Dependence on supplemental oxygen: Secondary | ICD-10-CM

## 2020-06-11 DIAGNOSIS — G4733 Obstructive sleep apnea (adult) (pediatric): Secondary | ICD-10-CM | POA: Diagnosis present

## 2020-06-11 DIAGNOSIS — I6782 Cerebral ischemia: Secondary | ICD-10-CM | POA: Diagnosis not present

## 2020-06-11 DIAGNOSIS — J8489 Other specified interstitial pulmonary diseases: Secondary | ICD-10-CM | POA: Diagnosis present

## 2020-06-11 DIAGNOSIS — N390 Urinary tract infection, site not specified: Secondary | ICD-10-CM | POA: Diagnosis not present

## 2020-06-11 DIAGNOSIS — J9611 Chronic respiratory failure with hypoxia: Secondary | ICD-10-CM | POA: Diagnosis not present

## 2020-06-11 DIAGNOSIS — M48061 Spinal stenosis, lumbar region without neurogenic claudication: Secondary | ICD-10-CM | POA: Diagnosis not present

## 2020-06-11 DIAGNOSIS — A0839 Other viral enteritis: Secondary | ICD-10-CM | POA: Diagnosis not present

## 2020-06-11 DIAGNOSIS — K529 Noninfective gastroenteritis and colitis, unspecified: Secondary | ICD-10-CM | POA: Diagnosis not present

## 2020-06-11 DIAGNOSIS — R531 Weakness: Secondary | ICD-10-CM | POA: Diagnosis not present

## 2020-06-11 DIAGNOSIS — I4891 Unspecified atrial fibrillation: Secondary | ICD-10-CM | POA: Diagnosis not present

## 2020-06-11 DIAGNOSIS — N179 Acute kidney failure, unspecified: Secondary | ICD-10-CM | POA: Diagnosis not present

## 2020-06-11 DIAGNOSIS — Z803 Family history of malignant neoplasm of breast: Secondary | ICD-10-CM

## 2020-06-11 DIAGNOSIS — E876 Hypokalemia: Secondary | ICD-10-CM | POA: Diagnosis not present

## 2020-06-11 DIAGNOSIS — B9620 Unspecified Escherichia coli [E. coli] as the cause of diseases classified elsewhere: Secondary | ICD-10-CM | POA: Diagnosis not present

## 2020-06-11 DIAGNOSIS — J849 Interstitial pulmonary disease, unspecified: Secondary | ICD-10-CM | POA: Diagnosis present

## 2020-06-11 DIAGNOSIS — R41841 Cognitive communication deficit: Secondary | ICD-10-CM | POA: Diagnosis not present

## 2020-06-11 DIAGNOSIS — Z743 Need for continuous supervision: Secondary | ICD-10-CM | POA: Diagnosis not present

## 2020-06-11 DIAGNOSIS — Z825 Family history of asthma and other chronic lower respiratory diseases: Secondary | ICD-10-CM

## 2020-06-11 DIAGNOSIS — R279 Unspecified lack of coordination: Secondary | ICD-10-CM | POA: Diagnosis not present

## 2020-06-11 DIAGNOSIS — R0902 Hypoxemia: Secondary | ICD-10-CM | POA: Diagnosis not present

## 2020-06-11 DIAGNOSIS — G2581 Restless legs syndrome: Secondary | ICD-10-CM | POA: Diagnosis present

## 2020-06-11 DIAGNOSIS — I10 Essential (primary) hypertension: Secondary | ICD-10-CM | POA: Diagnosis present

## 2020-06-11 DIAGNOSIS — Z7952 Long term (current) use of systemic steroids: Secondary | ICD-10-CM

## 2020-06-11 DIAGNOSIS — U071 COVID-19: Principal | ICD-10-CM | POA: Diagnosis present

## 2020-06-11 DIAGNOSIS — R0602 Shortness of breath: Secondary | ICD-10-CM | POA: Diagnosis not present

## 2020-06-11 DIAGNOSIS — M069 Rheumatoid arthritis, unspecified: Secondary | ICD-10-CM | POA: Diagnosis not present

## 2020-06-11 DIAGNOSIS — E860 Dehydration: Secondary | ICD-10-CM | POA: Diagnosis present

## 2020-06-11 DIAGNOSIS — J441 Chronic obstructive pulmonary disease with (acute) exacerbation: Secondary | ICD-10-CM | POA: Diagnosis not present

## 2020-06-11 DIAGNOSIS — Z87891 Personal history of nicotine dependence: Secondary | ICD-10-CM | POA: Diagnosis not present

## 2020-06-11 DIAGNOSIS — Z79899 Other long term (current) drug therapy: Secondary | ICD-10-CM

## 2020-06-11 DIAGNOSIS — R627 Adult failure to thrive: Secondary | ICD-10-CM | POA: Diagnosis present

## 2020-06-11 DIAGNOSIS — Z7401 Bed confinement status: Secondary | ICD-10-CM | POA: Diagnosis not present

## 2020-06-11 DIAGNOSIS — R2681 Unsteadiness on feet: Secondary | ICD-10-CM | POA: Diagnosis not present

## 2020-06-11 DIAGNOSIS — M4050 Lordosis, unspecified, site unspecified: Secondary | ICD-10-CM | POA: Diagnosis not present

## 2020-06-11 DIAGNOSIS — Z6841 Body Mass Index (BMI) 40.0 and over, adult: Secondary | ICD-10-CM

## 2020-06-11 DIAGNOSIS — M6281 Muscle weakness (generalized): Secondary | ICD-10-CM | POA: Diagnosis not present

## 2020-06-11 DIAGNOSIS — E875 Hyperkalemia: Secondary | ICD-10-CM | POA: Diagnosis not present

## 2020-06-11 DIAGNOSIS — J449 Chronic obstructive pulmonary disease, unspecified: Secondary | ICD-10-CM | POA: Diagnosis not present

## 2020-06-11 DIAGNOSIS — R000 Tachycardia, unspecified: Secondary | ICD-10-CM | POA: Diagnosis not present

## 2020-06-11 DIAGNOSIS — I517 Cardiomegaly: Secondary | ICD-10-CM | POA: Diagnosis not present

## 2020-06-11 LAB — COMPREHENSIVE METABOLIC PANEL
ALT: 15 U/L (ref 0–44)
AST: 28 U/L (ref 15–41)
Albumin: 3.1 g/dL — ABNORMAL LOW (ref 3.5–5.0)
Alkaline Phosphatase: 58 U/L (ref 38–126)
Anion gap: 10 (ref 5–15)
BUN: 22 mg/dL (ref 8–23)
CO2: 25 mmol/L (ref 22–32)
Calcium: 8.1 mg/dL — ABNORMAL LOW (ref 8.9–10.3)
Chloride: 102 mmol/L (ref 98–111)
Creatinine, Ser: 1.12 mg/dL — ABNORMAL HIGH (ref 0.44–1.00)
GFR, Estimated: 50 mL/min — ABNORMAL LOW (ref >60)
Glucose, Bld: 94 mg/dL (ref 70–99)
Potassium: 3.3 mmol/L — ABNORMAL LOW (ref 3.5–5.1)
Sodium: 137 mmol/L (ref 135–145)
Total Bilirubin: 1 mg/dL (ref 0.3–1.2)
Total Protein: 6.6 g/dL (ref 6.5–8.1)

## 2020-06-11 LAB — URINALYSIS, ROUTINE W REFLEX MICROSCOPIC
Bilirubin Urine: NEGATIVE
Glucose, UA: NEGATIVE mg/dL
Ketones, ur: NEGATIVE mg/dL
Nitrite: POSITIVE — AB
Protein, ur: 100 mg/dL — AB
Specific Gravity, Urine: 1.01 (ref 1.005–1.030)
WBC, UA: >50 WBC/hpf — ABNORMAL HIGH (ref 0–5)
pH: 5 (ref 5.0–8.0)

## 2020-06-11 LAB — CBC WITH DIFFERENTIAL/PLATELET
Abs Immature Granulocytes: 0.09 10*3/uL — ABNORMAL HIGH (ref 0.00–0.07)
Basophils Absolute: 0.1 10*3/uL (ref 0.0–0.1)
Basophils Relative: 1 %
Eosinophils Absolute: 0.4 10*3/uL (ref 0.0–0.5)
Eosinophils Relative: 4 %
HCT: 36 % (ref 36.0–46.0)
Hemoglobin: 11.5 g/dL — ABNORMAL LOW (ref 12.0–15.0)
Immature Granulocytes: 1 %
Lymphocytes Relative: 16 %
Lymphs Abs: 1.7 10*3/uL (ref 0.7–4.0)
MCH: 31.2 pg (ref 26.0–34.0)
MCHC: 31.9 g/dL (ref 30.0–36.0)
MCV: 97.6 fL (ref 80.0–100.0)
Monocytes Absolute: 1.7 10*3/uL — ABNORMAL HIGH (ref 0.1–1.0)
Monocytes Relative: 15 %
Neutro Abs: 6.9 10*3/uL (ref 1.7–7.7)
Neutrophils Relative %: 63 %
Platelets: 124 10*3/uL — ABNORMAL LOW (ref 150–400)
RBC: 3.69 MIL/uL — ABNORMAL LOW (ref 3.87–5.11)
RDW: 13.2 % (ref 11.5–15.5)
WBC: 10.8 10*3/uL — ABNORMAL HIGH (ref 4.0–10.5)
nRBC: 0 % (ref 0.0–0.2)

## 2020-06-11 LAB — POC SARS CORONAVIRUS 2 AG -  ED: SARS Coronavirus 2 Ag: POSITIVE — AB

## 2020-06-11 LAB — POC OCCULT BLOOD, ED: Fecal Occult Bld: NEGATIVE

## 2020-06-11 MED ORDER — ZINC SULFATE 220 (50 ZN) MG PO CAPS
220.0000 mg | ORAL_CAPSULE | Freq: Every day | ORAL | Status: DC
  Administered 2020-06-11 – 2020-06-15 (×5): 220 mg via ORAL
  Filled 2020-06-11 (×5): qty 1

## 2020-06-11 MED ORDER — IPRATROPIUM-ALBUTEROL 20-100 MCG/ACT IN AERS
1.0000 | INHALATION_SPRAY | Freq: Four times a day (QID) | RESPIRATORY_TRACT | Status: DC
  Administered 2020-06-12 – 2020-06-15 (×13): 1 via RESPIRATORY_TRACT
  Filled 2020-06-11: qty 4

## 2020-06-11 MED ORDER — ASPIRIN EC 81 MG PO TBEC
81.0000 mg | DELAYED_RELEASE_TABLET | ORAL | Status: DC
  Administered 2020-06-11 – 2020-06-15 (×3): 81 mg via ORAL
  Filled 2020-06-11 (×4): qty 1

## 2020-06-11 MED ORDER — CIPROFLOXACIN IN D5W 400 MG/200ML IV SOLN
400.0000 mg | Freq: Once | INTRAVENOUS | Status: AC
  Administered 2020-06-11: 400 mg via INTRAVENOUS
  Filled 2020-06-11: qty 200

## 2020-06-11 MED ORDER — SODIUM CHLORIDE 0.9 % IV SOLN
100.0000 mg | Freq: Every day | INTRAVENOUS | Status: AC
  Administered 2020-06-12 – 2020-06-13 (×2): 100 mg via INTRAVENOUS
  Filled 2020-06-11 (×2): qty 20

## 2020-06-11 MED ORDER — GUAIFENESIN ER 600 MG PO TB12
600.0000 mg | ORAL_TABLET | Freq: Every day | ORAL | Status: DC
  Administered 2020-06-11 – 2020-06-14 (×4): 600 mg via ORAL
  Filled 2020-06-11 (×4): qty 1

## 2020-06-11 MED ORDER — ACETAMINOPHEN 500 MG PO TABS
1000.0000 mg | ORAL_TABLET | Freq: Four times a day (QID) | ORAL | Status: DC | PRN
  Administered 2020-06-14: 1000 mg via ORAL
  Filled 2020-06-11: qty 2

## 2020-06-11 MED ORDER — LORATADINE 10 MG PO TABS
10.0000 mg | ORAL_TABLET | Freq: Every day | ORAL | Status: DC
  Administered 2020-06-11 – 2020-06-15 (×5): 10 mg via ORAL
  Filled 2020-06-11 (×5): qty 1

## 2020-06-11 MED ORDER — ROPINIROLE HCL 0.25 MG PO TABS
0.2500 mg | ORAL_TABLET | Freq: Three times a day (TID) | ORAL | Status: DC
  Administered 2020-06-11 – 2020-06-15 (×12): 0.25 mg via ORAL
  Filled 2020-06-11 (×12): qty 1

## 2020-06-11 MED ORDER — FLUTICASONE-UMECLIDIN-VILANT 200-62.5-25 MCG/INH IN AEPB
1.0000 | INHALATION_SPRAY | Freq: Every day | RESPIRATORY_TRACT | Status: DC
  Filled 2020-06-11 (×4): qty 1

## 2020-06-11 MED ORDER — PREDNISONE 5 MG PO TABS
7.5000 mg | ORAL_TABLET | Freq: Every day | ORAL | Status: DC
  Filled 2020-06-11: qty 1

## 2020-06-11 MED ORDER — ROSUVASTATIN CALCIUM 10 MG PO TABS
5.0000 mg | ORAL_TABLET | Freq: Every morning | ORAL | Status: DC
  Administered 2020-06-14 – 2020-06-15 (×2): 5 mg via ORAL
  Filled 2020-06-11 (×3): qty 1

## 2020-06-11 MED ORDER — SODIUM CHLORIDE 0.9 % IV SOLN
100.0000 mg | INTRAVENOUS | Status: DC
  Filled 2020-06-11 (×2): qty 20

## 2020-06-11 MED ORDER — SODIUM CHLORIDE 0.9 % IV SOLN
100.0000 mg | INTRAVENOUS | Status: AC
  Administered 2020-06-11 (×2): 100 mg via INTRAVENOUS
  Filled 2020-06-11 (×2): qty 20

## 2020-06-11 MED ORDER — POTASSIUM CHLORIDE CRYS ER 20 MEQ PO TBCR
30.0000 meq | EXTENDED_RELEASE_TABLET | Freq: Once | ORAL | Status: AC
  Administered 2020-06-11: 30 meq via ORAL
  Filled 2020-06-11: qty 2

## 2020-06-11 MED ORDER — ONDANSETRON HCL 4 MG PO TABS: 4.0000 mg | ORAL_TABLET | Freq: Four times a day (QID) | ORAL | Status: DC | PRN

## 2020-06-11 MED ORDER — ASCORBIC ACID 500 MG PO TABS
500.0000 mg | ORAL_TABLET | Freq: Every day | ORAL | Status: DC
  Administered 2020-06-11 – 2020-06-15 (×5): 500 mg via ORAL
  Filled 2020-06-11 (×5): qty 1

## 2020-06-11 MED ORDER — SODIUM CHLORIDE 0.9 % IV BOLUS (SEPSIS)
1000.0000 mL | Freq: Once | INTRAVENOUS | Status: AC
  Administered 2020-06-11: 1000 mL via INTRAVENOUS

## 2020-06-11 MED ORDER — LEFLUNOMIDE 20 MG PO TABS
20.0000 mg | ORAL_TABLET | Freq: Every morning | ORAL | Status: DC
  Administered 2020-06-12 – 2020-06-15 (×4): 20 mg via ORAL
  Filled 2020-06-11 (×5): qty 1

## 2020-06-11 MED ORDER — TRAMADOL HCL 50 MG PO TABS
25.0000 mg | ORAL_TABLET | Freq: Four times a day (QID) | ORAL | Status: DC | PRN
  Administered 2020-06-11: 50 mg via ORAL
  Filled 2020-06-11: qty 1

## 2020-06-11 MED ORDER — ONDANSETRON HCL 4 MG/2ML IJ SOLN
4.0000 mg | Freq: Four times a day (QID) | INTRAMUSCULAR | Status: DC | PRN
  Administered 2020-06-12: 4 mg via INTRAVENOUS
  Filled 2020-06-11: qty 2

## 2020-06-11 MED ORDER — HYDROXYCHLOROQUINE SULFATE 200 MG PO TABS
200.0000 mg | ORAL_TABLET | Freq: Two times a day (BID) | ORAL | Status: DC
  Administered 2020-06-12 – 2020-06-15 (×7): 200 mg via ORAL
  Filled 2020-06-11 (×8): qty 1

## 2020-06-11 MED ORDER — PANTOPRAZOLE SODIUM 40 MG PO TBEC
40.0000 mg | DELAYED_RELEASE_TABLET | Freq: Every day | ORAL | Status: DC
  Administered 2020-06-11 – 2020-06-15 (×5): 40 mg via ORAL
  Filled 2020-06-11 (×5): qty 1

## 2020-06-11 MED ORDER — CIPROFLOXACIN IN D5W 400 MG/200ML IV SOLN
400.0000 mg | Freq: Two times a day (BID) | INTRAVENOUS | Status: DC
  Administered 2020-06-12 – 2020-06-13 (×3): 400 mg via INTRAVENOUS
  Filled 2020-06-11 (×3): qty 200

## 2020-06-11 MED ORDER — ENOXAPARIN SODIUM 80 MG/0.8ML ~~LOC~~ SOLN
65.0000 mg | SUBCUTANEOUS | Status: DC
  Administered 2020-06-11 – 2020-06-14 (×4): 65 mg via SUBCUTANEOUS
  Filled 2020-06-11 (×4): qty 0.8

## 2020-06-11 MED ORDER — GUAIFENESIN-DM 100-10 MG/5ML PO SYRP
10.0000 mL | ORAL_SOLUTION | ORAL | Status: DC | PRN
  Administered 2020-06-12 (×2): 10 mL via ORAL
  Filled 2020-06-11 (×3): qty 10

## 2020-06-11 MED ORDER — HYDROCOD POLST-CPM POLST ER 10-8 MG/5ML PO SUER: 5.0000 mL | Freq: Two times a day (BID) | ORAL | Status: DC | PRN

## 2020-06-11 MED ORDER — SODIUM CHLORIDE 0.9 % IV SOLN: 100.0000 mg | Freq: Every day | INTRAVENOUS | Status: DC

## 2020-06-11 NOTE — H&P (Signed)
History and Physical    Rachel BOHL [name]:[id number] DOB: 11-19-1940 DOA: 06/11/2020  I have briefly reviewed the patient's prior medical records in [place]  PCP: Rachel Manes, MD  Patient coming from: home  Chief Complaint: Weakness  HPI: [name] is a 80 y.o. female with medical history significant of ILD/COPD with chronic hypoxic respiratory failure on 2 L at home, hypertension, restless leg syndrome, comes to the hospital with complaints of weakness.  Patient tells me that she has had dry cough over the last week and a half, and she had antigen testing in her home and tested positive for Covid last Thursday on 2/17.  She tells me that since then she has had significant weakness, in the last couple days she is barely able to ambulate.  She was also reporting burning with urination and a history of prior UTIs.  She denies any fever or chills, no abdominal pain, no nausea or vomiting.  She reports diarrhea which is now better since she hasn't been eating anything in the last couple of days due to poor appetite.  Reports that her breathing is at baseline, she has chronic shortness of breath but is feeling well on her chronic 2 L.  She was scheduled to receive outpatient Covid antibodies on 2/28.  She also reports that her legs have been swollen this morning but now resolved.  This is happening to her intermittently.  ED Course: In the ED she is afebrile 98.6, normotensive, satting 96% on home 2 L.  She is tachypneic at times but comfortable.  A chest x-ray was obtained which showed chronic diffuse interstitial changes and mild stable cardiomegaly.  Blood work is fairly unremarkable.  Urinalysis shows evidence of a UTI and she was given ciprofloxacin.  Due to weakness, we are asked to admit.  Covid PCR was positive  Review of Systems: All systems reviewed, and apart from HPI, all negative  Past Medical History:  Diagnosis Date  . Allergic rhinitis   . Asthma   . Asthmatic bronchitis    . Colon polyps   . COPD (chronic obstructive pulmonary disease) ([place])   . Diverticulosis   . GERD (gastroesophageal reflux disease)   . H/O: GI bleed   . Hypercholesterolemia   . Migraine headache   . Obesity   . RLS (restless legs syndrome)     Past Surgical History:  Procedure Laterality Date  . TONSILLECTOMY  x2  . TOTAL ABDOMINAL HYSTERECTOMY  02/17/1989     reports that she quit smoking about 19 years ago. Her smoking use included cigarettes. She started smoking about 66 years ago. She has a 46.00 pack-year smoking history. She has never used smokeless tobacco. She reports that she does not drink alcohol and does not use drugs.  Allergies  Allergen Reactions  . Shellfish Allergy Anaphylaxis    With vomiting and diarrhea  . Macrolides And Ketolides   . Penicillins     Facial numbness  . Sertraline Hcl   . Sulfa Antibiotics     Facial numbness    Family History  Problem Relation Age of Onset  . Allergies Mother   . Asthma Mother   . Breast cancer Mother   . Allergies Father     Prior to Admission medications   Medication Sig Start Date End Date Taking? Authorizing Provider  acetaminophen (TYLENOL) 500 MG tablet Take 1,000 mg by mouth every 6 (six) hours as needed for moderate pain.    [provider]  alendronate (FOSAMAX) 70 MG tablet  11/03/17   [provider]  Ascorbic Acid (VITAMIN C) 1000 MG tablet Take 1,000 mg by mouth daily.    [provider]  aspirin 81 MG tablet Take 81 mg by mouth every other day.    [provider]  Calcium Carbonate-Vitamin D (CALCIUM + D) 600-200 MG-UNIT TABS Take 1 tablet by mouth daily.      [provider]  Fluticasone-Umeclidin-Vilant (TRELEGY ELLIPTA) 200-62.5-25 MCG/INH AEPB Inhale 1 puff into the lungs daily. 06/01/19   Margaretha Seeds, MD  Fluticasone-Umeclidin-Vilant (TRELEGY ELLIPTA) 200-62.5-25 MCG/INH AEPB Inhale 1 puff into the lungs daily. 01/27/20   Margaretha Seeds, MD   guaiFENesin (MUCINEX) 600 MG 12 hr tablet Take 600 mg by mouth daily.     [provider]  hydroxychloroquine (PLAQUENIL) 200 MG tablet Take 200 mg by mouth 2 (two) times daily.    [provider]  Leflunomide (ARAVA PO) Take 20 mg by mouth daily.    [provider]  levalbuterol Penne Lash) 1.25 MG/3ML nebulizer solution Take 1.25 mg by nebulization 3 (three) times daily. Patient taking differently: Take 1.25 mg by nebulization 3 (three) times daily as needed.  02/06/15   Noralee Space, MD  levofloxacin (LEVAQUIN) 500 MG tablet Take 1 tablet (500 mg total) by mouth daily. Please call Pulm office when you take it [phone] 01/27/20   Margaretha Seeds, MD  lisinopril (PRINIVIL,ZESTRIL) 5 MG tablet Take 5 mg by mouth daily.  01/10/15   [provider]  loratadine (CLARITIN) 10 MG tablet Take 10 mg by mouth daily.      [provider]  meclizine (ANTIVERT) 25 MG tablet 1/2 to 1 tab every 6 hours as needed for dizzyness 02/24/18   Noralee Space, MD  omeprazole (PRILOSEC) 40 MG capsule Take 1 capsule by mouth daily. 05/18/15   [provider]  OXYGEN Inhale into the lungs. 2 lpm with rest and exertion    [provider]  predniSONE (DELTASONE) 5 MG tablet Take 7.5 mg by mouth daily with breakfast.     [provider]  rOPINIRole (REQUIP) 0.25 MG tablet  09/04/18   [provider]  traMADol (ULTRAM) 50 MG tablet Take 1/2 to 1 tablet TID with Extra Strength Tylenol 11/12/16   Noralee Space, MD  valACYclovir (VALTREX) 1000 MG tablet Take 1,000 mg by mouth daily as needed (for breakouts).     [provider]    Physical Exam: Vitals:   06/11/20 1230 06/11/20 1300 06/11/20 1330 06/11/20 1400  BP: 135/69 (!) 150/78 103/70 118/77  Pulse: 97 90 91 98  Resp: 20 19 (!) 22 (!) 23  Temp:      TempSrc:      SpO2: 97% 100% 100% 100%  Weight:      Height:        Constitutional: NAD, calm, comfortable Eyes: PERRL, lids  and conjunctivae normal ENMT: Mucous membranes are moist.  Neck: normal, supple Respiratory: clear to auscultation bilaterally, no wheezing, no crackles. Normal respiratory effort. No accessory muscle use.  Cardiovascular: Regular rate and rhythm, no murmurs / rubs / gallops. No extremity edema.  Abdomen: no tenderness, no masses palpated. Bowel sounds positive.  Musculoskeletal: no clubbing / cyanosis. Normal muscle tone.  Skin: no rashes, lesions, ulcers. No induration Neurologic: CN 2-12 grossly intact. Strength 5/5 in all 4.  Psychiatric: Normal judgment and insight. Alert and oriented x 3. Normal mood.   Labs on Admission:  I have personally reviewed following labs and imaging studies  CBC: Recent Labs  Lab 06/11/20 1039  WBC 10.8*  NEUTROABS 6.9  HGB 11.5*  HCT 36.0  MCV 97.6  PLT 053*   Basic Metabolic Panel: Recent Labs  Lab 06/11/20 1039  NA 137  K 3.3*  CL 102  CO2 25  GLUCOSE 94  BUN 22  CREATININE 1.12*  CALCIUM 8.1*   Liver Function Tests: Recent Labs  Lab 06/11/20 1039  AST 28  ALT 15  ALKPHOS 58  BILITOT 1.0  PROT 6.6  ALBUMIN 3.1*   Coagulation Profile: No results for input(s): INR, PROTIME in the last 168 hours. BNP (last 3 results) No results for input(s): PROBNP in the last 8760 hours. CBG: No results for input(s): GLUCAP in the last 168 hours. Thyroid Function Tests: No results for input(s): TSH, T4TOTAL, FREET4, T3FREE, THYROIDAB in the last 72 hours. Urine analysis:    Component Value Date/Time   COLORURINE YELLOW 06/11/2020 0942   APPEARANCEUR CLOUDY (A) 06/11/2020 0942   LABSPEC 1.010 06/11/2020 0942   PHURINE 5.0 06/11/2020 0942   GLUCOSEU NEGATIVE 06/11/2020 0942   HGBUR SMALL (A) 06/11/2020 0942   BILIRUBINUR NEGATIVE 06/11/2020 0942   KETONESUR NEGATIVE 06/11/2020 0942   PROTEINUR 100 (A) 06/11/2020 0942   NITRITE POSITIVE (A) 06/11/2020 0942   LEUKOCYTESUR LARGE (A) 06/11/2020 0942     Radiological Exams on  Admission: CT Head Wo Contrast  Result Date: 06/11/2020 CLINICAL DATA:  80 year old female with weakness. Unable to get off of the toilet this morning. Suspected cerebral hemorrhage. EXAM: CT HEAD WITHOUT CONTRAST TECHNIQUE: Contiguous axial images were obtained from the base of the skull through the vertex without intravenous contrast. COMPARISON:  No priors. FINDINGS: Brain: Patchy and confluent areas of decreased attenuation are noted throughout the deep and periventricular white matter of the cerebral hemispheres bilaterally, compatible with chronic microvascular ischemic disease. More well-defined focus of low attenuation in the superior aspect of the left basal ganglia, compatible with an old lacunar infarct. No evidence of acute infarction, hemorrhage, hydrocephalus, extra-axial collection or mass lesion/mass effect. Vascular: No hyperdense vessel or unexpected calcification. Skull: Normal. Negative for fracture or focal lesion. Sinuses/Orbits: No acute finding. Other: None. IMPRESSION: 1. No acute intracranial abnormalities. Specifically, no evidence of acute intracranial hemorrhage. 2. Chronic microvascular ischemic changes in the cerebral white matter, and old left basal ganglia lacunar infarct, as above. Electronically Signed   By: Vinnie Langton M.D.   On: 06/11/2020 10:23   CT Lumbar Spine Wo Contrast  Result Date: 06/11/2020 CLINICAL DATA:  80 year old female with weakness. EXAM: CT LUMBAR SPINE WITHOUT CONTRAST TECHNIQUE: Multidetector CT imaging of the lumbar spine was performed without intravenous contrast administration. Multiplanar CT image reconstructions were also generated. COMPARISON:  Lumbar MRI 03/05/2014.  Chest CT 11/05/2019. FINDINGS: Segmentation: Normal. Alignment: Unchanged lumbar lordosis since 2015. No spondylolisthesis. Vertebrae: Generalized osteopenia. 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 Visible lower  thoracic levels and ribs appear intact. Stable vertebral height since 2015. Visible sacrum and SI joints appear intact. No acute osseous abnormality identified. Paraspinal and other soft tissues: Negative visible lung bases. Aortoiliac calcified atherosclerosis. Vascular patency is not evaluated in the absence of IV contrast. Mildly tortuous infrarenal abdominal aorta. Negative visible other noncontrast abdominal viscera. Negative lumbar paraspinal soft tissues. Disc levels: Chronic lower lumbar spinal stenosis in part due to epidural lipomatosis appears stable since 2015. Multifactorial spinal stenosis at L4-L5 is mild-to-moderate  related to disc bulging and posterior element hypertrophy. Right side vacuum facet at that level. No significant spinal stenosis above L4. Vacuum disc at L1-L2 and L2-L3 is chronic but progressed. IMPRESSION: 1. Osteopenia.  No acute osseous abnormality. 2. Chronic lower lumbar spinal stenosis, in part due to epidural lipomatosis and mild to moderate at L4-L5, appears stable since a 2015 MRI. 3.  Aortic Atherosclerosis (ICD10-I70.0). Electronically Signed   By: Genevie Junnie M.D.   On: 06/11/2020 10:27    EKG: Independently reviewed.  Sinus rhythm  Assessment/Plan  Principal Problem Profound weakness, COVID-19 -obtain physical therapy consultation.  She has risk factors to develop severe disease, and even though she is a little bit out from her diagnosis may benefit from 3 days of Remdesivir, patient agrees with treatment.  Continue home chronic steroids.  PT consult  Active Problems ILD, chronic hypoxic respiratory failure-respiratory status seems at baseline, chest x-ray without acute findings.  There is a metallic object on the left lung base, will obtain a two-view chest x-ray for tomorrow morning to further characterize the exact location since location is not clear.  Images personally reviewed.  Awaiting pharmacy to reconcile all home medications before resuming home  regimen  Urinary tract infection-continue Cipro started in the ED, urine culture sent.  No prior microbiology data available  Essential hypertension-continue home medications  Restless leg syndrome-continue home medications  Hyperlipidemia-continue statin  Obesity, BMI 45-patient would benefit from weight loss long-term  Probably chronic kidney disease stage IIIa-creatinine 1.1, most recent creatinine 6 years ago at 0.7-0.9  DVT prophylaxis: Lovenox  Code Status: Full code  Family Communication: no family at bedside  Disposition Plan: home when ready  Bed Type: telemetry  Consults called: none   Obs/Inp: inpatient  At the time of admission, it appears that the appropriate admission status for this patient is INPATIENT as it is expected that patient will require hospital care > 2 midnights. This is judged to be reasonable and necessary in order to provide the required intensity of service to ensure the patient's safety given: presenting symptoms, initial radiographic and laboratory data and in the context of their chronic comorbidities. Together, these circumstances are felt to place patient at high at high risk for further clinical deterioration threatening life, limb, or organ.  Marzetta Board, MD, PhD [facility]  Contact via [email or web address]  06/11/2020, 3:27 PM

## 2020-06-11 NOTE — ED Notes (Signed)
ED TO INPATIENT HANDOFF REPORT  ED Nurse Name and Phone #:   S Name/Age/Gender [name] 80 y.o. female Room/Bed: APA01/APA01  Code Status   Code Status: Full Code  Home/SNF/Other Home Patient oriented to: self, place, time and situation Is this baseline? Yes   Triage Complete: Triage complete  Chief Complaint COVID-19 [U07.1]  Triage Note Pt tested positive for covid on Thursday. Pt began to have increased sob this morning while transferring from the toilet. Pt on 2 L [place] chronically.   EMS stated pts oxygen on 2 L [place] was at 85 %.     Allergies Allergies  Allergen Reactions  . Shellfish Allergy Anaphylaxis    With vomiting and diarrhea  . Atorvastatin Other (See Comments)    Leg muscle pain  . Sertraline Hcl     Unknown reaction  . Macrolides And Ketolides Rash  . Penicillins Other (See Comments)    Facial numbness  . Sulfa Antibiotics Other (See Comments)    Facial numbness    Level of Care/Admitting Diagnosis ED Disposition    ED Disposition Condition [facility] Area: [facility] [[id number]]  Level of Care: Telemetry [5]  Covid Evaluation: Confirmed COVID Positive  Diagnosis: [id number] [[id number]]  Admitting Physician: Caren Griffins [[id number]]  Attending Physician: Caren Griffins [phone]  Estimated length of stay: past midnight tomorrow  Certification:: I certify this patient will need inpatient services for at least 2 midnights       B Medical/Surgery History Past Medical History:  Diagnosis Date  . Allergic rhinitis   . Asthma   . Asthmatic bronchitis   . Colon polyps   . COPD (chronic obstructive pulmonary disease) ([place])   . Diverticulosis   . GERD (gastroesophageal reflux disease)   . H/O: GI bleed   . Hypercholesterolemia   . Migraine headache   . Obesity   . RLS (restless legs syndrome)    Past Surgical History:  Procedure Laterality Date  . TONSILLECTOMY  x2  . TOTAL ABDOMINAL HYSTERECTOMY  02/17/1989      A IV Location/Drains/Wounds Patient Lines/Drains/Airways Status    Active Line/Drains/Airways    Name Placement date Placement time Site Days   Peripheral IV 06/11/20 Left Forearm 06/11/20  1513  Forearm  less than 1          Intake/Output Last 24 hours  Intake/Output Summary (Last 24 hours) at 06/11/2020 2107 Last data filed at 06/11/2020 1654 Gross per 24 hour  Intake 1184.48 ml  Output --  Net 1184.48 ml    Labs/Imaging Results for orders placed or performed during the hospital encounter of 06/11/20 (from the past 48 hour(s))  Urinalysis, Routine w reflex microscopic Urine, Clean Catch     Status: Abnormal   Collection Time: 06/11/20  9:42 AM  Result Value Ref Range   Color, Urine YELLOW YELLOW   APPearance CLOUDY (A) CLEAR   Specific Gravity, Urine 1.010 1.005 - 1.030   pH 5.0 5.0 - 8.0   Glucose, UA NEGATIVE NEGATIVE mg/dL   Hgb urine dipstick SMALL (A) NEGATIVE   Bilirubin Urine NEGATIVE NEGATIVE   Ketones, ur NEGATIVE NEGATIVE mg/dL   Protein, ur 100 (A) NEGATIVE mg/dL   Nitrite POSITIVE (A) NEGATIVE   Leukocytes,Ua LARGE (A) NEGATIVE   RBC / HPF 21-50 0 - 5 RBC/hpf   WBC, UA >50 (H) 0 - 5 WBC/hpf   Bacteria, UA MANY (A) NONE SEEN   Squamous Epithelial / LPF 0-5 0 -  5   WBC Clumps PRESENT     Comment: Performed at [facility] [address], [place], [place] [address]  CBC with Differential/Platelet     Status: Abnormal   Collection Time: 06/11/20 10:39 AM  Result Value Ref Range   WBC 10.8 (H) 4.0 - 10.5 K/uL   RBC 3.69 (L) 3.87 - 5.11 MIL/uL   Hemoglobin 11.5 (L) 12.0 - 15.0 g/dL   HCT 36.0 36.0 - 46.0 %   MCV 97.6 80.0 - 100.0 fL   MCH 31.2 26.0 - 34.0 pg   MCHC 31.9 30.0 - 36.0 g/dL   RDW 13.2 11.5 - 15.5 %   Platelets 124 (L) 150 - 400 K/uL   nRBC 0.0 0.0 - 0.2 %   Neutrophils Relative % 63 %   Neutro Abs 6.9 1.7 - 7.7 K/uL   Lymphocytes Relative 16 %   Lymphs Abs 1.7 0.7 - 4.0 K/uL   Monocytes Relative 15 %   Monocytes Absolute 1.7 (H) 0.1  - 1.0 K/uL   Eosinophils Relative 4 %   Eosinophils Absolute 0.4 0.0 - 0.5 K/uL   Basophils Relative 1 %   Basophils Absolute 0.1 0.0 - 0.1 K/uL   Immature Granulocytes 1 %   Abs Immature Granulocytes 0.09 (H) 0.00 - 0.07 K/uL    Comment: Performed at [facility], [address], [place], [place] [address]  Comprehensive metabolic panel     Status: Abnormal   Collection Time: 06/11/20 10:39 AM  Result Value Ref Range   Sodium 137 135 - 145 mmol/L   Potassium 3.3 (L) 3.5 - 5.1 mmol/L   Chloride 102 98 - 111 mmol/L   CO2 25 22 - 32 mmol/L   Glucose, Bld 94 70 - 99 mg/dL    Comment: Glucose reference range applies only to samples taken after fasting for at least 8 hours.   BUN 22 8 - 23 mg/dL   Creatinine, Ser 1.12 (H) 0.44 - 1.00 mg/dL   Calcium 8.1 (L) 8.9 - 10.3 mg/dL   Total Protein 6.6 6.5 - 8.1 g/dL   Albumin 3.1 (L) 3.5 - 5.0 g/dL   AST 28 15 - 41 U/L   ALT 15 0 - 44 U/L   Alkaline Phosphatase 58 38 - 126 U/L   Total Bilirubin 1.0 0.3 - 1.2 mg/dL   GFR, Estimated 50 (L) >60 mL/min    Comment: (NOTE) Calculated using the CKD-EPI Creatinine Equation (2021)    Anion gap 10 5 - 15    Comment: Performed at [facility], [address], [place], [place] [address]  POC occult blood, ED     Status: None   Collection Time: 06/11/20  2:52 PM  Result Value Ref Range   Fecal Occult Bld NEGATIVE NEGATIVE  POC SARS Coronavirus 2 Ag-ED - Nasal Swab (BD Veritor Kit)     Status: Abnormal   Collection Time: 06/11/20  3:08 PM  Result Value Ref Range   SARS Coronavirus 2 Ag Positive (A) Negative   CT Head Wo Contrast  Result Date: 06/11/2020 CLINICAL DATA:  80 year old female with weakness. Unable to get off of the toilet this morning. Suspected cerebral hemorrhage. EXAM: CT HEAD WITHOUT CONTRAST TECHNIQUE: Contiguous axial images were obtained from the base of the skull through the vertex without intravenous contrast. COMPARISON:  No priors. FINDINGS: Brain: Patchy and confluent areas of  decreased attenuation are noted throughout the deep and periventricular white matter of the cerebral hemispheres bilaterally, compatible with chronic microvascular ischemic  disease. More well-defined focus of low attenuation in the superior aspect of the left basal ganglia, compatible with an old lacunar infarct. No evidence of acute infarction, hemorrhage, hydrocephalus, extra-axial collection or mass lesion/mass effect. Vascular: No hyperdense vessel or unexpected calcification. Skull: Normal. Negative for fracture or focal lesion. Sinuses/Orbits: No acute finding. Other: None. IMPRESSION: 1. No acute intracranial abnormalities. Specifically, no evidence of acute intracranial hemorrhage. 2. Chronic microvascular ischemic changes in the cerebral white matter, and old left basal ganglia lacunar infarct, as above. Electronically Signed   By: Vinnie Langton M.D.   On: 06/11/2020 10:23   CT Lumbar Spine Wo Contrast  Result Date: 06/11/2020 CLINICAL DATA:  80 year old female with weakness. EXAM: CT LUMBAR SPINE WITHOUT CONTRAST TECHNIQUE: Multidetector CT imaging of the lumbar spine was performed without intravenous contrast administration. Multiplanar CT image reconstructions were also generated. COMPARISON:  Lumbar MRI 03/05/2014.  Chest CT 11/05/2019. FINDINGS: Segmentation: Normal. Alignment: Unchanged lumbar lordosis since 2015. No spondylolisthesis. Vertebrae: Generalized osteopenia. 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 Visible lower thoracic levels and ribs appear intact. Stable vertebral height since 2015. Visible sacrum and SI joints appear intact. No acute osseous abnormality identified. Paraspinal and other soft tissues: Negative visible lung bases. Aortoiliac calcified atherosclerosis. Vascular patency is not evaluated in the absence of IV contrast. Mildly tortuous infrarenal abdominal aorta. Negative visible other noncontrast abdominal  viscera. Negative lumbar paraspinal soft tissues. Disc levels: Chronic lower lumbar spinal stenosis in part due to epidural lipomatosis appears stable since 2015. Multifactorial spinal stenosis at L4-L5 is mild-to-moderate related to disc bulging and posterior element hypertrophy. Right side vacuum facet at that level. No significant spinal stenosis above L4. Vacuum disc at L1-L2 and L2-L3 is chronic but progressed. IMPRESSION: 1. Osteopenia.  No acute osseous abnormality. 2. Chronic lower lumbar spinal stenosis, in part due to epidural lipomatosis and mild to moderate at L4-L5, appears stable since a 2015 MRI. 3.  Aortic Atherosclerosis (ICD10-I70.0). Electronically Signed   By: Genevie Khia M.D.   On: 06/11/2020 10:27   DG Chest Port 1 View  Result Date: 06/11/2020 CLINICAL DATA:  COVID-19 positive four days ago with worsening shortness of breath this morning. EXAM: PORTABLE CHEST 1 VIEW COMPARISON:  11/24/2017 FINDINGS: Lungs are adequately inflated without focal airspace consolidation or effusion. Mild chronic diffuse prominence of the bronchovascular markings. Mild stable cardiomegaly. Small rectangular metallic density projects over the left base of uncertain clinical significance. Remainder of the exam is unchanged. IMPRESSION: 1. No acute cardiopulmonary disease. 2. Mild stable cardiomegaly. Mild chronic diffuse interstitial changes. 3. Small metallic foreign body projecting over the left base. Recommend clinical correlation. Electronically Signed   By: Marin Olp M.D.   On: 06/11/2020 15:42    Pending Labs Unresulted Labs (From admission, onward)          Start     Ordered   06/12/20 0500  CBC with Differential/Platelet  Daily,   R      06/11/20 1823   06/12/20 0500  C-reactive protein  Daily,   R      06/11/20 1823   06/12/20 0500  D-dimer, quantitative  Daily,   R      06/11/20 1823   06/12/20 0500  Comprehensive metabolic panel  Daily,   R      06/11/20 1824   06/11/20 1448  Urine  Culture  ONCE - STAT,   STAT        06/11/20 1447  Vitals/Pain Today's Vitals   06/11/20 1929 06/11/20 1930 06/11/20 2000 06/11/20 2030  BP:  109/60 (!) 116/99 126/74  Pulse:  97 98 (!) 104  Resp:  (!) 21 (!) 25 (!) 22  Temp:      TempSrc:      SpO2:  100% 100% 100%  Weight:      Height:      PainSc: 7  7       Isolation Precautions Airborne and Contact precautions  Medications Medications  acetaminophen (TYLENOL) tablet 1,000 mg (has no administration in time range)  aspirin EC tablet 81 mg (81 mg Oral Given 06/11/20 1923)  hydroxychloroquine (PLAQUENIL) tablet 200 mg (200 mg Oral Not Given 06/11/20 2018)  predniSONE (DELTASONE) tablet 7.5 mg (has no administration in time range)  pantoprazole (PROTONIX) EC tablet 40 mg (40 mg Oral Given 06/11/20 1924)  enoxaparin (LOVENOX) injection 65 mg (65 mg Subcutaneous Given 06/11/20 1923)  Ipratropium-Albuterol (COMBIVENT) respimat 1 puff (0 puffs Inhalation Hold 06/12/20 0200)  guaiFENesin-dextromethorphan (ROBITUSSIN DM) 100-10 MG/5ML syrup 10 mL (has no administration in time range)  chlorpheniramine-HYDROcodone (TUSSIONEX) 10-8 MG/5ML suspension 5 mL (has no administration in time range)  ascorbic acid (VITAMIN C) tablet 500 mg (500 mg Oral Given 06/11/20 1924)  zinc sulfate capsule 220 mg (220 mg Oral Given 06/11/20 1924)  ondansetron (ZOFRAN) tablet 4 mg (has no administration in time range)    Or  ondansetron (ZOFRAN) injection 4 mg (has no administration in time range)  ciprofloxacin (CIPRO) IVPB 400 mg (has no administration in time range)  leflunomide (ARAVA) tablet 20 mg (has no administration in time range)  rosuvastatin (CRESTOR) tablet 5 mg (has no administration in time range)  rOPINIRole (REQUIP) tablet 0.25 mg (has no administration in time range)  Fluticasone-Umeclidin-Vilant 200-62.5-25 MCG/INH AEPB 1 puff (1 puff Inhalation Not Given 06/11/20 1927)  guaiFENesin (MUCINEX) 12 hr tablet 600 mg (600 mg Oral Given  06/11/20 1924)  loratadine (CLARITIN) tablet 10 mg (10 mg Oral Given 06/11/20 1923)  traMADol (ULTRAM) tablet 25-50 mg (50 mg Oral Given 06/11/20 1834)  remdesivir 100 mg in sodium chloride 0.9 % 100 mL IVPB (100 mg Intravenous New Bag/Given 06/11/20 2036)    And  remdesivir 100 mg in sodium chloride 0.9 % 100 mL IVPB (has no administration in time range)  sodium chloride 0.9 % bolus 1,000 mL (0 mLs Intravenous Stopped 06/11/20 1631)  ciprofloxacin (CIPRO) IVPB 400 mg (0 mg Intravenous Stopped 06/11/20 1654)  potassium chloride SA (KLOR-CON) CR tablet 30 mEq (30 mEq Oral Given 06/11/20 1759)    Mobility walks High fall risk   Focused Assessments    R Recommendations: See Admitting Provider Note  Report given to:   Additional Notes:

## 2020-06-11 NOTE — ED Triage Notes (Signed)
Pt tested positive for covid on Thursday. Pt began to have increased sob this morning while transferring from the toilet. Pt on 2 L [place] chronically.   EMS stated pts oxygen on 2 L [place] was at 85 %.

## 2020-06-11 NOTE — ED Notes (Signed)
Laural Benes (son) updated about impending admission.

## 2020-06-11 NOTE — ED Notes (Signed)
Pt aware urine sample is needed. Water provided.

## 2020-06-11 NOTE — Progress Notes (Signed)
Referral received for COVID treatment.   Sx onset 2/18. A member from our team screened her yesterday to set her up for monoclonal antibody infusion for Monday - however that is actually disease day 11 and would put her too far out anyway.  Unfortunately she presented to the ER overnight with worsening disease indicative for more severe COVID hypoxic to 85% on normal 2LPM.   Likely she will require admission for care related to severe disease.    Janene Madeira, MSN, NP-C [facility] [email or web address] Pager: [phone] Office: [phone] [place]: [phone]

## 2020-06-11 NOTE — ED Provider Notes (Signed)
[facility] Provider Note   CSN: [id number] Arrival date & time: 06/11/20  8657     History Chief Complaint  Patient presents with  . Shortness of Breath    [name] is a 80 y.o. female.  Patient complains of weakness.  Patient tested positive a few days ago for Covid.  She has had a cough for over a week.  Patient also has had diarrhea for 3 days.  She was not strong up to get off the toilet.  The history is provided by the patient and medical records. No language interpreter was used.  Shortness of Breath Severity:  Moderate Onset quality:  Sudden Timing:  Constant Progression:  Worsening Chronicity:  New Context: not activity   Relieved by:  Nothing Worsened by:  Nothing Ineffective treatments:  None tried Associated symptoms: no abdominal pain, no chest pain, no cough, no headaches and no rash        Past Medical History:  Diagnosis Date  . Allergic rhinitis   . Asthma   . Asthmatic bronchitis   . Colon polyps   . COPD (chronic obstructive pulmonary disease) ([place])   . Diverticulosis   . GERD (gastroesophageal reflux disease)   . H/O: GI bleed   . Hypercholesterolemia   . Migraine headache   . Obesity   . RLS (restless legs syndrome)     Patient Active Problem List   Diagnosis Date Noted  . Chronic respiratory failure with hypoxia ([place]) 06/01/2019  . COPD mixed type ([place]) 03/14/2015  . Rheumatoid arthritis ([place]) 03/14/2015  . Dyspnea 02/06/2015  . ILD (interstitial lung disease) ([place]) 02/06/2015  . Cough 05/17/2011    Past Surgical History:  Procedure Laterality Date  . TONSILLECTOMY  x2  . TOTAL ABDOMINAL HYSTERECTOMY  02/17/1989     OB History   No obstetric history on file.     Family History  Problem Relation Age of Onset  . Allergies Mother   . Asthma Mother   . Breast cancer Mother   . Allergies Father     Social History   Tobacco Use  . Smoking status: Former Smoker    Packs/day: 1.00    Years: 46.00     Pack years: 46.00    Types: Cigarettes    Start date: 99    Quit date: 04/15/2001    Years since quitting: 19.1  . Smokeless tobacco: Never Used  Substance Use Topics  . Alcohol use: No  . Drug use: No    Home Medications Prior to Admission medications   Medication Sig Start Date End Date Taking? Authorizing Provider  acetaminophen (TYLENOL) 500 MG tablet Take 1,000 mg by mouth every 6 (six) hours as needed for moderate pain.    [provider]  alendronate (FOSAMAX) 70 MG tablet  11/03/17   [provider]  Ascorbic Acid (VITAMIN C) 1000 MG tablet Take 1,000 mg by mouth daily.    [provider]  aspirin 81 MG tablet Take 81 mg by mouth every other day.    [provider]  Calcium Carbonate-Vitamin D (CALCIUM + D) 600-200 MG-UNIT TABS Take 1 tablet by mouth daily.      [provider]  Fluticasone-Umeclidin-Vilant (TRELEGY ELLIPTA) 200-62.5-25 MCG/INH AEPB Inhale 1 puff into the lungs daily. 06/01/19   Margaretha Seeds, MD  Fluticasone-Umeclidin-Vilant (TRELEGY ELLIPTA) 200-62.5-25 MCG/INH AEPB Inhale 1 puff into the lungs daily. 01/27/20   Margaretha Seeds, MD  guaiFENesin ([place]) 600 MG 12  hr tablet Take 600 mg by mouth daily.     [provider]  hydroxychloroquine (PLAQUENIL) 200 MG tablet Take 200 mg by mouth 2 (two) times daily.    [provider]  Leflunomide (ARAVA PO) Take 20 mg by mouth daily.    [provider]  levalbuterol Penne Lash) 1.25 MG/3ML nebulizer solution Take 1.25 mg by nebulization 3 (three) times daily. Patient taking differently: Take 1.25 mg by nebulization 3 (three) times daily as needed.  02/06/15   Noralee Space, MD  levofloxacin (LEVAQUIN) 500 MG tablet Take 1 tablet (500 mg total) by mouth daily. Please call Pulm office when you take it [phone] 01/27/20   Margaretha Seeds, MD  lisinopril (PRINIVIL,ZESTRIL) 5 MG tablet Take 5 mg by mouth daily.  01/10/15   [provider]   loratadine (CLARITIN) 10 MG tablet Take 10 mg by mouth daily.      [provider]  meclizine (ANTIVERT) 25 MG tablet 1/2 to 1 tab every 6 hours as needed for dizzyness 02/24/18   Noralee Space, MD  omeprazole (PRILOSEC) 40 MG capsule Take 1 capsule by mouth daily. 05/18/15   [provider]  OXYGEN Inhale into the lungs. 2 lpm with rest and exertion    [provider]  predniSONE (DELTASONE) 5 MG tablet Take 7.5 mg by mouth daily with breakfast.     [provider]  rOPINIRole (REQUIP) 0.25 MG tablet  09/04/18   [provider]  traMADol (ULTRAM) 50 MG tablet Take 1/2 to 1 tablet TID with Extra Strength Tylenol 11/12/16   Noralee Space, MD  valACYclovir (VALTREX) 1000 MG tablet Take 1,000 mg by mouth daily as needed (for breakouts).     [provider]    Allergies    Shellfish allergy, Macrolides and ketolides, Penicillins, Sertraline hcl, and Sulfa antibiotics  Review of Systems   Review of Systems  Constitutional: Positive for fatigue. Negative for appetite change.  HENT: Negative for congestion, ear discharge and sinus pressure.   Eyes: Negative for discharge.  Respiratory: Positive for shortness of breath. Negative for cough.   Cardiovascular: Negative for chest pain.  Gastrointestinal: Negative for abdominal pain and diarrhea.  Genitourinary: Negative for frequency and hematuria.  Musculoskeletal: Negative for back pain.  Skin: Negative for rash.  Neurological: Negative for seizures and headaches.  Psychiatric/Behavioral: Negative for hallucinations.    Physical Exam Updated Vital Signs BP 118/77   Pulse 98   Temp 98.6 F (37 C) (Oral)   Resp (!) 23   Ht 5\' 7"  (1.702 m)   Wt 130.6 kg   SpO2 100%   BMI 45.11 kg/m   Physical Exam Vitals and nursing note reviewed.  Constitutional:      Appearance: She is well-developed.  HENT:     Head: Normocephalic.     Nose: Nose normal.  Eyes:     General: No scleral  icterus.    Extraocular Movements: EOM normal.     Conjunctiva/sclera: Conjunctivae normal.  Neck:     Thyroid: No thyromegaly.  Cardiovascular:     Rate and Rhythm: Normal rate and regular rhythm.     Heart sounds: No murmur heard. No friction rub. No gallop.   Pulmonary:     Breath sounds: No stridor. No wheezing or rales.  Chest:     Chest wall: No tenderness.  Abdominal:     General: There is no distension.     Tenderness: There is no abdominal tenderness. There  is no rebound.  Musculoskeletal:        General: No edema. Normal range of motion.     Cervical back: Neck supple.     Comments: Patient too fatigued to ambulate  Lymphadenopathy:     Cervical: No cervical adenopathy.  Skin:    Findings: No erythema or rash.  Neurological:     Mental Status: She is alert and oriented to person, place, and time.     Motor: No abnormal muscle tone.     Coordination: Coordination normal.  Psychiatric:        Mood and Affect: Mood and affect normal.        Behavior: Behavior normal.     ED Results / Procedures / Treatments   Labs (all labs ordered are listed, but only abnormal results are displayed) Labs Reviewed  CBC WITH DIFFERENTIAL/PLATELET - Abnormal; Notable for the following components:      Result Value   WBC 10.8 (*)    RBC 3.69 (*)    Hemoglobin 11.5 (*)    Platelets 124 (*)    Monocytes Absolute 1.7 (*)    Abs Immature Granulocytes 0.09 (*)    All other components within normal limits  COMPREHENSIVE METABOLIC PANEL - Abnormal; Notable for the following components:   Potassium 3.3 (*)    Creatinine, Ser 1.12 (*)    Calcium 8.1 (*)    Albumin 3.1 (*)    GFR, Estimated 50 (*)    All other components within normal limits  URINALYSIS, ROUTINE W REFLEX MICROSCOPIC - Abnormal; Notable for the following components:   APPearance CLOUDY (*)    Hgb urine dipstick SMALL (*)    Protein, ur 100 (*)    Nitrite POSITIVE (*)    Leukocytes,Ua LARGE (*)    WBC, UA >50 (*)     Bacteria, UA MANY (*)    All other components within normal limits  POC SARS CORONAVIRUS 2 AG -  ED - Abnormal; Notable for the following components:   SARS Coronavirus 2 Ag Positive (*)    All other components within normal limits  URINE CULTURE  POC OCCULT BLOOD, ED    EKG None  Radiology CT Head Wo Contrast  Result Date: 06/11/2020 CLINICAL DATA:  80 year old female with weakness. Unable to get off of the toilet this morning. Suspected cerebral hemorrhage. EXAM: CT HEAD WITHOUT CONTRAST TECHNIQUE: Contiguous axial images were obtained from the base of the skull through the vertex without intravenous contrast. COMPARISON:  No priors. FINDINGS: Brain: Patchy and confluent areas of decreased attenuation are noted throughout the deep and periventricular white matter of the cerebral hemispheres bilaterally, compatible with chronic microvascular ischemic disease. More well-defined focus of low attenuation in the superior aspect of the left basal ganglia, compatible with an old lacunar infarct. No evidence of acute infarction, hemorrhage, hydrocephalus, extra-axial collection or mass lesion/mass effect. Vascular: No hyperdense vessel or unexpected calcification. Skull: Normal. Negative for fracture or focal lesion. Sinuses/Orbits: No acute finding. Other: None. IMPRESSION: 1. No acute intracranial abnormalities. Specifically, no evidence of acute intracranial hemorrhage. 2. Chronic microvascular ischemic changes in the cerebral white matter, and old left basal ganglia lacunar infarct, as above. Electronically Signed   By: Vinnie Langton M.D.   On: 06/11/2020 10:23   CT Lumbar Spine Wo Contrast  Result Date: 06/11/2020 CLINICAL DATA:  79 year old female with weakness. EXAM: CT LUMBAR SPINE WITHOUT CONTRAST TECHNIQUE: Multidetector CT imaging of the lumbar spine was performed without intravenous contrast administration. Multiplanar CT  image reconstructions were also generated. COMPARISON:  Lumbar  MRI 03/05/2014.  Chest CT 11/05/2019. FINDINGS: Segmentation: Normal. Alignment: Unchanged lumbar lordosis since 2015. No spondylolisthesis. Vertebrae: Generalized osteopenia. 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 Visible lower thoracic levels and ribs appear intact. Stable vertebral height since 2015. Visible sacrum and SI joints appear intact. No acute osseous abnormality identified. Paraspinal and other soft tissues: Negative visible lung bases. Aortoiliac calcified atherosclerosis. Vascular patency is not evaluated in the absence of IV contrast. Mildly tortuous infrarenal abdominal aorta. Negative visible other noncontrast abdominal viscera. Negative lumbar paraspinal soft tissues. Disc levels: Chronic lower lumbar spinal stenosis in part due to epidural lipomatosis appears stable since 2015. Multifactorial spinal stenosis at L4-L5 is mild-to-moderate related to disc bulging and posterior element hypertrophy. Right side vacuum facet at that level. No significant spinal stenosis above L4. Vacuum disc at L1-L2 and L2-L3 is chronic but progressed. IMPRESSION: 1. Osteopenia.  No acute osseous abnormality. 2. Chronic lower lumbar spinal stenosis, in part due to epidural lipomatosis and mild to moderate at L4-L5, appears stable since a 2015 MRI. 3.  Aortic Atherosclerosis (ICD10-I70.0). Electronically Signed   By: Genevie Nykayla M.D.   On: 06/11/2020 10:27    Procedures Procedures   Medications Ordered in ED Medications  sodium chloride 0.9 % bolus 1,000 mL (has no administration in time range)  ciprofloxacin (CIPRO) IVPB 400 mg (has no administration in time range)    ED Course  I have reviewed the triage vital signs and the nursing notes.  Pertinent labs & imaging results that were available during my care of the patient were reviewed by me and considered in my medical decision making (see chart for details).    MDM Rules/Calculators/A&P                           Patient with fatigue UTI Covid infection unable to walk.  She will be admitted to medicine Final Clinical Impression(s) / ED Diagnoses Final diagnoses:  None    Rx / DC Orders ED Discharge Orders    None       Milton Ferguson, MD 06/15/20 0930

## 2020-06-12 DIAGNOSIS — N390 Urinary tract infection, site not specified: Secondary | ICD-10-CM

## 2020-06-12 DIAGNOSIS — J849 Interstitial pulmonary disease, unspecified: Secondary | ICD-10-CM

## 2020-06-12 DIAGNOSIS — J9611 Chronic respiratory failure with hypoxia: Secondary | ICD-10-CM

## 2020-06-12 LAB — CBC WITH DIFFERENTIAL/PLATELET
Abs Immature Granulocytes: 0.08 10*3/uL — ABNORMAL HIGH (ref 0.00–0.07)
Basophils Absolute: 0.1 10*3/uL (ref 0.0–0.1)
Basophils Relative: 1 %
Eosinophils Absolute: 0.5 10*3/uL (ref 0.0–0.5)
Eosinophils Relative: 6 %
HCT: 33.3 % — ABNORMAL LOW (ref 36.0–46.0)
Hemoglobin: 10.3 g/dL — ABNORMAL LOW (ref 12.0–15.0)
Immature Granulocytes: 1 %
Lymphocytes Relative: 23 %
Lymphs Abs: 2 10*3/uL (ref 0.7–4.0)
MCH: 31 pg (ref 26.0–34.0)
MCHC: 30.9 g/dL (ref 30.0–36.0)
MCV: 100.3 fL — ABNORMAL HIGH (ref 80.0–100.0)
Monocytes Absolute: 1.5 10*3/uL — ABNORMAL HIGH (ref 0.1–1.0)
Monocytes Relative: 18 %
Neutro Abs: 4.4 10*3/uL (ref 1.7–7.7)
Neutrophils Relative %: 51 %
Platelets: 118 10*3/uL — ABNORMAL LOW (ref 150–400)
RBC: 3.32 MIL/uL — ABNORMAL LOW (ref 3.87–5.11)
RDW: 13.3 % (ref 11.5–15.5)
WBC: 8.5 10*3/uL (ref 4.0–10.5)
nRBC: 0 % (ref 0.0–0.2)

## 2020-06-12 LAB — COMPREHENSIVE METABOLIC PANEL
ALT: 15 U/L (ref 0–44)
AST: 25 U/L (ref 15–41)
Albumin: 2.7 g/dL — ABNORMAL LOW (ref 3.5–5.0)
Alkaline Phosphatase: 51 U/L (ref 38–126)
Anion gap: 12 (ref 5–15)
BUN: 18 mg/dL (ref 8–23)
CO2: 23 mmol/L (ref 22–32)
Calcium: 8.5 mg/dL — ABNORMAL LOW (ref 8.9–10.3)
Chloride: 105 mmol/L (ref 98–111)
Creatinine, Ser: 0.85 mg/dL (ref 0.44–1.00)
GFR, Estimated: >60 mL/min (ref >60)
Glucose, Bld: 83 mg/dL (ref 70–99)
Potassium: 4.1 mmol/L (ref 3.5–5.1)
Sodium: 140 mmol/L (ref 135–145)
Total Bilirubin: 0.5 mg/dL (ref 0.3–1.2)
Total Protein: 5.7 g/dL — ABNORMAL LOW (ref 6.5–8.1)

## 2020-06-12 LAB — TSH: TSH: 1.638 u[IU]/mL (ref 0.350–4.500)

## 2020-06-12 LAB — FOLATE: Folate: 19.3 ng/mL (ref >5.9)

## 2020-06-12 LAB — D-DIMER, QUANTITATIVE: D-Dimer, Quant: 3.83 ug/mL-FEU — ABNORMAL HIGH (ref 0.00–0.50)

## 2020-06-12 LAB — C-REACTIVE PROTEIN: CRP: 11.2 mg/dL — ABNORMAL HIGH (ref <1.0)

## 2020-06-12 LAB — VITAMIN B12: Vitamin B-12: 1303 pg/mL — ABNORMAL HIGH (ref 180–914)

## 2020-06-12 LAB — T4, FREE: Free T4: 0.98 ng/dL (ref 0.61–1.12)

## 2020-06-12 MED ORDER — PREDNISONE 10 MG PO TABS
15.0000 mg | ORAL_TABLET | Freq: Every day | ORAL | Status: DC
  Administered 2020-06-12: 15 mg via ORAL
  Filled 2020-06-12 (×2): qty 2

## 2020-06-12 MED ORDER — POTASSIUM CHLORIDE CRYS ER 20 MEQ PO TBCR
40.0000 meq | EXTENDED_RELEASE_TABLET | Freq: Once | ORAL | Status: AC
  Administered 2020-06-12: 40 meq via ORAL
  Filled 2020-06-12: qty 2

## 2020-06-12 MED ORDER — UMECLIDINIUM BROMIDE 62.5 MCG/INH IN AEPB
1.0000 | INHALATION_SPRAY | Freq: Every day | RESPIRATORY_TRACT | Status: DC
  Administered 2020-06-12 – 2020-06-15 (×4): 1 via RESPIRATORY_TRACT
  Filled 2020-06-12: qty 7

## 2020-06-12 MED ORDER — FLUTICASONE FUROATE-VILANTEROL 200-25 MCG/INH IN AEPB
1.0000 | INHALATION_SPRAY | Freq: Every day | RESPIRATORY_TRACT | Status: DC
  Administered 2020-06-12 – 2020-06-15 (×4): 1 via RESPIRATORY_TRACT
  Filled 2020-06-12: qty 28

## 2020-06-12 MED ORDER — LACTATED RINGERS IV SOLN: INTRAVENOUS | Status: DC

## 2020-06-12 NOTE — Plan of Care (Signed)
  Problem: Education: Goal: Knowledge of General Education information will improve Description: Including pain rating scale, medication(s)/side effects and non-pharmacologic comfort measures Outcome: Progressing   Problem: Clinical Measurements: Goal: Ability to maintain clinical measurements within normal limits will improve Outcome: Progressing Goal: Respiratory complications will improve Outcome: Progressing   Problem: Activity: Goal: Risk for activity intolerance will decrease Outcome: Progressing   Problem: Skin Integrity: Goal: Risk for impaired skin integrity will decrease Outcome: Progressing

## 2020-06-12 NOTE — Plan of Care (Signed)
  Problem: Acute Rehab PT Goals(only PT should resolve) Goal: Pt Will Go Supine/Side To Sit Outcome: Progressing Flowsheets (Taken 06/12/2020 1425) Pt will go Supine/Side to Sit:  with supervision  with min guard assist Goal: Patient Will Transfer Sit To/From Stand Outcome: Progressing Flowsheets (Taken 06/12/2020 1425) Patient will transfer sit to/from stand:  with supervision  with min guard assist Goal: Pt Will Transfer Bed To Chair/Chair To Bed Outcome: Progressing Flowsheets (Taken 06/12/2020 1425) Pt will Transfer Bed to Chair/Chair to Bed:  with supervision  min guard assist Goal: Pt Will Ambulate Outcome: Progressing Flowsheets (Taken 06/12/2020 1425) Pt will Ambulate:  50 feet  with min guard assist  with supervision  with rolling walker   2:26 PM, 06/12/20 Lonell Grandchild, MPT Physical Therapist with [facility] [phone] office [phone] mobile phone

## 2020-06-12 NOTE — Progress Notes (Addendum)
PROGRESS NOTE  [name] WGN:[id number] DOB: October 16, 1940 DOA: 06/11/2020 PCP: Rachel Manes, MD  Brief History:  80 year old female with a history of rheumatoid arthritis related ILD, COPD, chronic respiratory failure on 2 L, hypertension, GERD, hyperlipidemia, OSA on CPAP presenting with over 1 week history of generalized weakness and nonproductive cough with decreased oral intake.  The patient states that she took a home Covid 19 test which was positive on 06/01/2020.  Since then, her symptoms have progressed with nausea and decreased oral intake and loose stools.  She denies any hematochezia or melena.  She had one episode of emesis on the day of admission.  She denies any abdominal pain but complains of dysuria for the past week.  She denies any hemoptysis, chest pain, headache.  She has chronic shortness of breath which she states has not been any worse.  She has not had any fevers or chills.  Her generalized weakness has progressed to the point where she was having difficulty getting off the commode on the day of admission.  As result, EMS was activated. In the emergency department, the patient was afebrile hemodynamically stable with oxygen saturation down to 85% on 2 L.  The patient was increased to 3 L nasal cannula.  BMP showed a sodium 137, potassium 3.3, serum creatinine 1.12.  LFTs were unremarkable.  WBC 10.8, hemoglobin 11.5, platelets 124,000  Assessment/Plan: Generalized weakness/failure to thrive -Multifactorial including the patient's UTI and COVID-19 infection -Serum H84 -Folic acid -TSH -PT evaluation -Continue IV fluids  COVID-19 infection/gastroenteritis -Manifested by worsening nausea and loose stools -Finished 3 days of remdesivir -she has been vaccinated x 3  UTI -Continue ciprofloxacin pending culture data  Hypokalemia -Replete -Check magnesium  COPD/interstitial lung disease/chronic respiratory failure with hypoxia -Patient follows pulm/Dr.  Loanne [name] -Continue Trelegy equivalent -Continue prednisone--double home dose temporarily for stress dosing -Patient is chronically on 2 L nasal cannula -Currently stable on 2 L  Rheumatoid arthritis -Patient follows Dr. Trudie [name] -Continue Arava and Plaquenil  Hypertension -Holding lisinopril temporarily secondary to dehydration  Hyperlipidemia -Continue statin  GERD -Continue PPI  Morbid obesity -BMI 45.11 -Lifestyle modification    Status is: Inpatient  Remains inpatient appropriate because:IV treatments appropriate due to intensity of illness or inability to take PO   Dispo: The patient is from: Home              Anticipated d/c is to: Home              Patient currently is not medically stable to d/c.   Difficult to place patient No        Family Communication:  Son updated 2/28  Consultants:  none  Code Status:  FULL   DVT Prophylaxis:  [place] Lovenox   Procedures: As Listed in Progress Note Above  Antibiotics: cipro 2/27>>     Subjective: Patient continues to feel weak and has a nonproductive cough with nausea.  She denies any chest pain, worsening shortness of breath, vomiting, diarrhea, abdominal pain.  She is continues to have some dysuria.  Objective: Vitals:   06/11/20 2100 06/11/20 2142 06/12/20 0324 06/12/20 0615  BP: 127/68 121/70 136/84 133/66  Pulse: 93 68 91 84  Resp: (!) 28 20 20 18   Temp:  98 F (36.7 C) 98.1 F (36.7 C) 98 F (36.7 C)  TempSrc:  Oral Oral Oral  SpO2: 99% 99% 99% 99%  Weight:      Height:  Intake/Output Summary (Last 24 hours) at 06/12/2020 0728 Last data filed at 06/12/2020 0658 Gross per 24 hour  Intake 1314.48 ml  Output -  Net 1314.48 ml   Weight change:  Exam:   General:  Pt is alert, follows commands appropriately, not in acute distress  HEENT: No icterus, No thrush, No neck mass, [place]/AT  Cardiovascular: RRR, S1/S2, no rubs, no gallops  Respiratory: Bilateral rales.  No  wheezing  Abdomen: Soft/+BS, non tender, non distended, no guarding  Extremities: No edema, No lymphangitis, No petechiae, No rashes, no synovitis   Data Reviewed: I have personally reviewed following labs and imaging studies Basic Metabolic Panel: Recent Labs  Lab 06/11/20 1039  NA 137  K 3.3*  CL 102  CO2 25  GLUCOSE 94  BUN 22  CREATININE 1.12*  CALCIUM 8.1*   Liver Function Tests: Recent Labs  Lab 06/11/20 1039  AST 28  ALT 15  ALKPHOS 58  BILITOT 1.0  PROT 6.6  ALBUMIN 3.1*   No results for input(s): LIPASE, AMYLASE in the last 168 hours. No results for input(s): AMMONIA in the last 168 hours. Coagulation Profile: No results for input(s): INR, PROTIME in the last 168 hours. CBC: Recent Labs  Lab 06/11/20 1039 06/12/20 0549  WBC 10.8* 8.5  NEUTROABS 6.9 4.4  HGB 11.5* 10.3*  HCT 36.0 33.3*  MCV 97.6 100.3*  PLT 124* 118*   Cardiac Enzymes: No results for input(s): CKTOTAL, CKMB, CKMBINDEX, TROPONINI in the last 168 hours. BNP: Invalid input(s): POCBNP CBG: No results for input(s): GLUCAP in the last 168 hours. HbA1C: No results for input(s): HGBA1C in the last 72 hours. Urine analysis:    Component Value Date/Time   COLORURINE YELLOW 06/11/2020 0942   APPEARANCEUR CLOUDY (A) 06/11/2020 0942   LABSPEC 1.010 06/11/2020 0942   PHURINE 5.0 06/11/2020 0942   GLUCOSEU NEGATIVE 06/11/2020 0942   HGBUR SMALL (A) 06/11/2020 0942   BILIRUBINUR NEGATIVE 06/11/2020 0942   KETONESUR NEGATIVE 06/11/2020 0942   PROTEINUR 100 (A) 06/11/2020 0942   NITRITE POSITIVE (A) 06/11/2020 0942   LEUKOCYTESUR LARGE (A) 06/11/2020 0942   Sepsis Labs: @LABRCNTIP (procalcitonin:4,lacticidven:4) )No results found for this or any previous visit (from the past 240 hour(s)).   Scheduled Meds: . vitamin C  500 mg Oral Daily  . aspirin EC  81 mg Oral QODAY  . enoxaparin (LOVENOX) injection  65 mg Subcutaneous Q24H  . Fluticasone-Umeclidin-Vilant  1 puff Inhalation Daily   . guaiFENesin  600 mg Oral Daily  . hydroxychloroquine  200 mg Oral BID  . Ipratropium-Albuterol  1 puff Inhalation Q6H  . leflunomide  20 mg Oral q morning  . loratadine  10 mg Oral Daily  . pantoprazole  40 mg Oral Daily  . predniSONE  7.5 mg Oral Q breakfast  . rOPINIRole  0.25 mg Oral TID  . rosuvastatin  5 mg Oral q morning  . zinc sulfate  220 mg Oral Daily   Continuous Infusions: . ciprofloxacin 400 mg (06/12/20 0619)  . remdesivir 100 mg in NS 100 mL      Procedures/Studies: CT Head Wo Contrast  Result Date: 06/11/2020 CLINICAL DATA:  80 year old female with weakness. Unable to get off of the toilet this morning. Suspected cerebral hemorrhage. EXAM: CT HEAD WITHOUT CONTRAST TECHNIQUE: Contiguous axial images were obtained from the base of the skull through the vertex without intravenous contrast. COMPARISON:  No priors. FINDINGS: Brain: Patchy and confluent areas of decreased attenuation are noted throughout the deep and periventricular  white matter of the cerebral hemispheres bilaterally, compatible with chronic microvascular ischemic disease. More well-defined focus of low attenuation in the superior aspect of the left basal ganglia, compatible with an old lacunar infarct. No evidence of acute infarction, hemorrhage, hydrocephalus, extra-axial collection or mass lesion/mass effect. Vascular: No hyperdense vessel or unexpected calcification. Skull: Normal. Negative for fracture or focal lesion. Sinuses/Orbits: No acute finding. Other: None. IMPRESSION: 1. No acute intracranial abnormalities. Specifically, no evidence of acute intracranial hemorrhage. 2. Chronic microvascular ischemic changes in the cerebral white matter, and old left basal ganglia lacunar infarct, as above. Electronically Signed   By: Vinnie Langton M.D.   On: 06/11/2020 10:23   CT Lumbar Spine Wo Contrast  Result Date: 06/11/2020 CLINICAL DATA:  80 year old female with weakness. EXAM: CT LUMBAR SPINE WITHOUT  CONTRAST TECHNIQUE: Multidetector CT imaging of the lumbar spine was performed without intravenous contrast administration. Multiplanar CT image reconstructions were also generated. COMPARISON:  Lumbar MRI 03/05/2014.  Chest CT 11/05/2019. FINDINGS: Segmentation: Normal. Alignment: Unchanged lumbar lordosis since 2015. No spondylolisthesis. Vertebrae: Generalized osteopenia. 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 Visible lower thoracic levels and ribs appear intact. Stable vertebral height since 2015. Visible sacrum and SI joints appear intact. No acute osseous abnormality identified. Paraspinal and other soft tissues: Negative visible lung bases. Aortoiliac calcified atherosclerosis. Vascular patency is not evaluated in the absence of IV contrast. Mildly tortuous infrarenal abdominal aorta. Negative visible other noncontrast abdominal viscera. Negative lumbar paraspinal soft tissues. Disc levels: Chronic lower lumbar spinal stenosis in part due to epidural lipomatosis appears stable since 2015. Multifactorial spinal stenosis at L4-L5 is mild-to-moderate related to disc bulging and posterior element hypertrophy. Right side vacuum facet at that level. No significant spinal stenosis above L4. Vacuum disc at L1-L2 and L2-L3 is chronic but progressed. IMPRESSION: 1. Osteopenia.  No acute osseous abnormality. 2. Chronic lower lumbar spinal stenosis, in part due to epidural lipomatosis and mild to moderate at L4-L5, appears stable since a 2015 MRI. 3.  Aortic Atherosclerosis (ICD10-I70.0). Electronically Signed   By: Genevie Elza M.D.   On: 06/11/2020 10:27   DG Chest Port 1 View  Result Date: 06/11/2020 CLINICAL DATA:  COVID-19 positive four days ago with worsening shortness of breath this morning. EXAM: PORTABLE CHEST 1 VIEW COMPARISON:  11/24/2017 FINDINGS: Lungs are adequately inflated without focal airspace consolidation or effusion. Mild chronic diffuse  prominence of the bronchovascular markings. Mild stable cardiomegaly. Small rectangular metallic density projects over the left base of uncertain clinical significance. Remainder of the exam is unchanged. IMPRESSION: 1. No acute cardiopulmonary disease. 2. Mild stable cardiomegaly. Mild chronic diffuse interstitial changes. 3. Small metallic foreign body projecting over the left base. Recommend clinical correlation. Electronically Signed   By: Marin Olp M.D.   On: 06/11/2020 15:42    Orson Eva, DO  [facility]  If 7PM-7AM, please contact night-coverage [email or web address] Password [email or web address] 06/12/2020, 7:28 AM   LOS: 1 day

## 2020-06-12 NOTE — Evaluation (Signed)
Physical Therapy Evaluation Patient Details Name: [name] MRN: [id number] DOB: April 08, 1941 Today's Date: 06/12/2020   History of Present Illness  [name] is a 80 y.o. female with medical history significant of ILD/COPD with chronic hypoxic respiratory failure on 2 L at home, hypertension, restless leg syndrome, comes to the hospital with complaints of weakness.  Patient tells me that she has had dry cough over the last week and a half, and she had antigen testing in her home and tested positive for Covid last Thursday on 2/17.  She tells me that since then she has had significant weakness, in the last couple days she is barely able to ambulate.  She was also reporting burning with urination and a history of prior UTIs.  She denies any fever or chills, no abdominal pain, no nausea or vomiting.  She reports diarrhea which is now better since she hasn't been eating anything in the last couple of days due to poor appetite.  Reports that her breathing is at baseline, she has chronic shortness of breath but is feeling well on her chronic 2 L.  She was scheduled to receive outpatient Covid antibodies on 2/28.  She also reports that her legs have been swollen this morning but now resolved.  This is happening to her intermittently.    Clinical Impression  Patient demonstrates slow labored movement for sitting up at bedside, requires repeated attempts before able to stand using RW, once on feet demonstrates good return for taking steps at bedside without loss of balance, but limited due to c/o fatigue.  Patient tolerated sitting up in chair after therapy - RN notified.  Patient will benefit from continued physical therapy in hospital and recommended venue below to increase strength, balance, endurance for safe ADLs and gait.       Follow Up Recommendations Home health PT;Supervision for mobility/OOB;Supervision - Intermittent    Equipment Recommendations  None recommended by PT    Recommendations  for Other Services       Precautions / Restrictions Precautions Precautions: Fall Restrictions Weight Bearing Restrictions: No      Mobility  Bed Mobility Overal bed mobility: Needs Assistance Bed Mobility: Supine to Sit     Supine to sit: Min assist;Mod assist     General bed mobility comments: increased time, labored movement    Transfers Overall transfer level: Needs assistance   Transfers: Sit to/from Stand;Stand Pivot Transfers Sit to Stand: Min assist;Mod assist Stand pivot transfers: Min guard;Min assist       General transfer comment: had difficulty for completing sit to stands due to BLE weakness requiring repeated attempts  Ambulation/Gait   Gait Distance (Feet): 15 Feet Assistive device: Rolling walker (2 wheeled) Gait Pattern/deviations: Decreased step length - right;Decreased step length - left;Decreased stride length Gait velocity: decreased   General Gait Details: slightly unsteady labored cadence taking steps forward/backwards at bedside, limited secondary to fatigue, on 2 LPM O2  Stairs            Wheelchair Mobility    Modified Rankin (Stroke Patients Only)       Balance Overall balance assessment: Needs assistance Sitting-balance support: Feet supported;No upper extremity supported Sitting balance-Leahy Scale: Good Sitting balance - Comments: seated at EOB   Standing balance support: During functional activity;Bilateral upper extremity supported Standing balance-Leahy Scale: Fair Standing balance comment: fair/good using RW  Pertinent Vitals/Pain Pain Assessment: No/denies pain    Home Living Family/patient expects to be discharged to:: Private residence Living Arrangements: Alone Available Help at Discharge: Family;Available PRN/intermittently Type of Home: House Home Access: Ramped entrance     Home Layout: One level Home Equipment: Hand held shower head;Walker - 4 wheels;Shower  seat - built in      Prior Function Level of Independence: Needs assistance   Gait / Transfers Assistance Needed: household and short distanced Electronics engineer, 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 Dependent 2 LPM  ADL's / Homemaking Assistance Needed: assisted by family for community ADL's        Hand Dominance        Extremity/Trunk Assessment   Upper Extremity Assessment Upper Extremity Assessment: Generalized weakness    Lower Extremity Assessment Lower Extremity Assessment: Generalized weakness    Cervical / Trunk Assessment Cervical / Trunk Assessment: Normal  Communication   Communication: No difficulties  Cognition Arousal/Alertness: Awake/alert Behavior During Therapy: WFL for tasks assessed/performed Overall Cognitive Status: Within Functional Limits for tasks assessed                                        General Comments      Exercises     Assessment/Plan    PT Assessment Patient needs continued PT services  PT Problem List Decreased strength;Decreased activity tolerance;Decreased balance;Decreased mobility       PT Treatment Interventions DME instruction;Gait training;Functional mobility training;Therapeutic activities;Therapeutic exercise;Balance training;Patient/family education    PT Goals (Current goals can be found in the Care Plan section)  Acute Rehab PT Goals Patient Stated Goal: return home with family to assist PT Goal Formulation: With patient Time For Goal Achievement: 06/19/20 Potential to Achieve Goals: Good    Frequency Min 3X/week   Barriers to discharge        Co-evaluation               AM-PAC PT "6 Clicks" Mobility  Outcome Measure Help needed turning from your back to your side while in a flat bed without using bedrails?: A Little Help needed moving from lying on your back to sitting on the side of a flat bed without using bedrails?: A Lot Help needed moving to and from a bed to a chair (including a  wheelchair)?: A Little Help needed standing up from a chair using your arms (e.g., wheelchair or bedside chair)?: A Lot Help needed to walk in hospital room?: A Little Help needed climbing 3-5 steps with a railing? : A Lot 6 Click Score: 15    End of Session Equipment Utilized During Treatment: Oxygen Activity Tolerance: Patient tolerated treatment well;Patient limited by fatigue Patient left: in chair;with call bell/phone within reach Nurse Communication: Mobility status PT Visit Diagnosis: Unsteadiness on feet (R26.81);Other abnormalities of gait and mobility (R26.89);Muscle weakness (generalized) (M62.81)    Time: 9562-1308 PT Time Calculation (min) (ACUTE ONLY): 30 min   Charges:   PT Evaluation $PT Eval Moderate Complexity: 1 Mod PT Treatments $Therapeutic Activity: 23-37 mins        2:25 PM, 06/12/20 Lonell Grandchild, MPT Physical Therapist with [facility] 336 [phone] office [phone] mobile phone

## 2020-06-13 LAB — COMPREHENSIVE METABOLIC PANEL
ALT: 16 U/L (ref 0–44)
AST: 25 U/L (ref 15–41)
Albumin: 2.7 g/dL — ABNORMAL LOW (ref 3.5–5.0)
Alkaline Phosphatase: 55 U/L (ref 38–126)
Anion gap: 9 (ref 5–15)
BUN: 16 mg/dL (ref 8–23)
CO2: 25 mmol/L (ref 22–32)
Calcium: 8.2 mg/dL — ABNORMAL LOW (ref 8.9–10.3)
Chloride: 108 mmol/L (ref 98–111)
Creatinine, Ser: 0.84 mg/dL (ref 0.44–1.00)
GFR, Estimated: >60 mL/min (ref >60)
Glucose, Bld: 142 mg/dL — ABNORMAL HIGH (ref 70–99)
Potassium: 5.4 mmol/L — ABNORMAL HIGH (ref 3.5–5.1)
Sodium: 142 mmol/L (ref 135–145)
Total Bilirubin: 0.5 mg/dL (ref 0.3–1.2)
Total Protein: 5.6 g/dL — ABNORMAL LOW (ref 6.5–8.1)

## 2020-06-13 LAB — CBC WITH DIFFERENTIAL/PLATELET
Abs Immature Granulocytes: 0.07 10*3/uL (ref 0.00–0.07)
Basophils Absolute: 0.1 10*3/uL (ref 0.0–0.1)
Basophils Relative: 1 %
Eosinophils Absolute: 0 10*3/uL (ref 0.0–0.5)
Eosinophils Relative: 0 %
HCT: 31.8 % — ABNORMAL LOW (ref 36.0–46.0)
Hemoglobin: 10.1 g/dL — ABNORMAL LOW (ref 12.0–15.0)
Immature Granulocytes: 1 %
Lymphocytes Relative: 18 %
Lymphs Abs: 1.5 10*3/uL (ref 0.7–4.0)
MCH: 31.9 pg (ref 26.0–34.0)
MCHC: 31.8 g/dL (ref 30.0–36.0)
MCV: 100.3 fL — ABNORMAL HIGH (ref 80.0–100.0)
Monocytes Absolute: 1 10*3/uL (ref 0.1–1.0)
Monocytes Relative: 11 %
Neutro Abs: 6 10*3/uL (ref 1.7–7.7)
Neutrophils Relative %: 69 %
Platelets: 126 10*3/uL — ABNORMAL LOW (ref 150–400)
RBC: 3.17 MIL/uL — ABNORMAL LOW (ref 3.87–5.11)
RDW: 13.2 % (ref 11.5–15.5)
WBC: 8.7 10*3/uL (ref 4.0–10.5)
nRBC: 0 % (ref 0.0–0.2)

## 2020-06-13 LAB — D-DIMER, QUANTITATIVE: D-Dimer, Quant: 3.36 ug/mL-FEU — ABNORMAL HIGH (ref 0.00–0.50)

## 2020-06-13 LAB — C-REACTIVE PROTEIN: CRP: 8.8 mg/dL — ABNORMAL HIGH (ref <1.0)

## 2020-06-13 LAB — BASIC METABOLIC PANEL
Anion gap: 8 (ref 5–15)
BUN: 15 mg/dL (ref 8–23)
CO2: 27 mmol/L (ref 22–32)
Calcium: 8.2 mg/dL — ABNORMAL LOW (ref 8.9–10.3)
Chloride: 107 mmol/L (ref 98–111)
Creatinine, Ser: 0.86 mg/dL (ref 0.44–1.00)
GFR, Estimated: >60 mL/min (ref >60)
Glucose, Bld: 142 mg/dL — ABNORMAL HIGH (ref 70–99)
Potassium: 4.6 mmol/L (ref 3.5–5.1)
Sodium: 142 mmol/L (ref 135–145)

## 2020-06-13 LAB — URINE CULTURE: Culture: >=100000 — AB

## 2020-06-13 LAB — MAGNESIUM: Magnesium: 1.5 mg/dL — ABNORMAL LOW (ref 1.7–2.4)

## 2020-06-13 MED ORDER — MAGNESIUM SULFATE 2 GM/50ML IV SOLN
2.0000 g | Freq: Once | INTRAVENOUS | Status: AC
  Administered 2020-06-13: 2 g via INTRAVENOUS
  Filled 2020-06-13: qty 50

## 2020-06-13 MED ORDER — CEFDINIR 300 MG PO CAPS
300.0000 mg | ORAL_CAPSULE | Freq: Two times a day (BID) | ORAL | Status: DC
  Administered 2020-06-13 – 2020-06-15 (×5): 300 mg via ORAL
  Filled 2020-06-13 (×5): qty 1

## 2020-06-13 MED ORDER — SODIUM ZIRCONIUM CYCLOSILICATE 10 G PO PACK
10.0000 g | PACK | Freq: Once | ORAL | Status: AC
  Administered 2020-06-13: 10 g via ORAL
  Filled 2020-06-13: qty 1

## 2020-06-13 MED ORDER — PREDNISONE 20 MG PO TABS
50.0000 mg | ORAL_TABLET | Freq: Every day | ORAL | Status: DC
  Administered 2020-06-13 – 2020-06-15 (×3): 50 mg via ORAL
  Filled 2020-06-13: qty 1
  Filled 2020-06-13 (×2): qty 2

## 2020-06-13 MED ORDER — PREDNISONE 20 MG PO TABS: 60.0000 mg | ORAL_TABLET | Freq: Every day | ORAL | Status: DC

## 2020-06-13 NOTE — Progress Notes (Addendum)
PROGRESS NOTE  [name] SWN:[id number] DOB: 23-Feb-1941 DOA: 06/11/2020 PCP: Lajean Manes, MD  Brief History:  80 year old female with a history of rheumatoid arthritis related ILD, COPD, chronic respiratory failure on 2 L, hypertension, GERD, hyperlipidemia, OSA on CPAP presenting with over 1 week history of generalized weakness and nonproductive cough with decreased oral intake.  The patient states that she took a home Covid 19 test which was positive on 06/01/2020.  Since then, her symptoms have progressed with nausea and decreased oral intake and loose stools.  She denies any hematochezia or melena.  She had one episode of emesis on the day of admission.  She denies any abdominal pain but complains of dysuria for the past week.  She denies any hemoptysis, chest pain, headache.  She has chronic shortness of breath which she states has not been any worse.  She has not had any fevers or chills.  Her generalized weakness has progressed to the point where she was having difficulty getting off the commode on the day of admission.  As result, EMS was activated. In the emergency department, the patient was afebrile hemodynamically stable with oxygen saturation down to 85% on 2 L.  The patient was increased to 3 L nasal cannula.  BMP showed a sodium 137, potassium 3.3, serum creatinine 1.12.  LFTs were unremarkable.  WBC 10.8, hemoglobin 11.5, platelets 124,000  Assessment/Plan: Generalized weakness/failure to thrive -Multifactorial including the patient's UTI and COVID-19 infection -Serum X38--1829 -Folic HBZJ--69.6 -VEL--3.810 -PT evaluation -Continue IV fluids  COVID-19 infection/gastroenteritis -Manifested by worsening nausea and loose stools -Finished 3 days of remdesivir -she has been vaccinated x 3  UTI -d/c cipro -start cefdinir  COPD exacerbation -mild -still wheezing on exam but improving -start prednisone 50 mg  daily  Hypokalemia>>Hyperkalemic -Repleted  Hypomagnesemia -replete  COPD/interstitial lung disease/chronic respiratory failure with hypoxia -Patient follows pulm/Dr. Loanne Drilling -Continue Trelegy equivalent -Continue prednisone--double home dose temporarily for stress dosing -Patient is chronically on 2 L nasal cannula -Currently stable on 2 L  Rheumatoid arthritis -Patient follows Dr. Trudie Reed -Continue Arava and Plaquenil  Hypertension -Holding lisinopril temporarily secondary to dehydration  Hyperlipidemia -Continue statin  GERD -Continue PPI  Morbid obesity -BMI 45.11 -Lifestyle modification    Status is: Inpatient  Remains inpatient appropriate because:IV treatments appropriate due to intensity of illness or inability to take PO   Dispo: The patient is from: Home  Anticipated d/c is to: SNF  Patient currently is not medically stable to d/c.              Difficult to place patient No        Family Communication:  Son updated 2/28  Consultants:  none  Code Status:  FULL   DVT Prophylaxis:  [place] Lovenox   Procedures: As Listed in Progress Note Above  Antibiotics: cipro 2/27>>3/1 Cefdinir 3/1>>    Subjective: Patient feeling better, but still weak.   Patient denies fevers, chills, headache, chest pain, dyspnea, nausea, vomiting, diarrhea, abdominal pain, dysuria, hematuria, hematochezia, and melena.   Objective: Vitals:   06/13/20 0556 06/13/20 0753 06/13/20 1407 06/13/20 1431  BP: (!) 147/81  (!) 117/53   Pulse: 81  83   Resp:      Temp: 97.6 F (36.4 C)  97.6 F (36.4 C)   TempSrc: Oral  Oral   SpO2: 96% 98% 99% 99%  Weight:      Height:        Intake/Output  Summary (Last 24 hours) at 06/13/2020 1848 Last data filed at 06/13/2020 1300 Gross per 24 hour  Intake 850 ml  Output 400 ml  Net 450 ml   Weight change:  Exam:   General:  Pt is alert, follows commands appropriately, not  in acute distress  HEENT: No icterus, No thrush, No neck mass, [place]/AT  Cardiovascular: RRR, S1/S2, no rubs, no gallops  Respiratory: bibasilar rales. No wheeze  Abdomen: Soft/+BS, non tender, non distended, no guarding  Extremities: No edema, No lymphangitis, No petechiae, No rashes, no synovitis   Data Reviewed: I have personally reviewed following labs and imaging studies Basic Metabolic Panel: Recent Labs  Lab 06/11/20 1039 06/12/20 0549 06/13/20 0634 06/13/20 1410  NA 137 140 142 142  K 3.3* 4.1 5.4* 4.6  CL 102 105 108 107  CO2 25 23 25 27   GLUCOSE 94 83 142* 142*  BUN 22 18 16 15   CREATININE 1.12* 0.85 0.84 0.86  CALCIUM 8.1* 8.5* 8.2* 8.2*  MG  --   --  1.5*  --    Liver Function Tests: Recent Labs  Lab 06/11/20 1039 06/12/20 0549 06/13/20 0634  AST 28 25 25   ALT 15 15 16   ALKPHOS 58 51 55  BILITOT 1.0 0.5 0.5  PROT 6.6 5.7* 5.6*  ALBUMIN 3.1* 2.7* 2.7*   No results for input(s): LIPASE, AMYLASE in the last 168 hours. No results for input(s): AMMONIA in the last 168 hours. Coagulation Profile: No results for input(s): INR, PROTIME in the last 168 hours. CBC: Recent Labs  Lab 06/11/20 1039 06/12/20 0549 06/13/20 0634  WBC 10.8* 8.5 8.7  NEUTROABS 6.9 4.4 6.0  HGB 11.5* 10.3* 10.1*  HCT 36.0 33.3* 31.8*  MCV 97.6 100.3* 100.3*  PLT 124* 118* 126*   Cardiac Enzymes: No results for input(s): CKTOTAL, CKMB, CKMBINDEX, TROPONINI in the last 168 hours. BNP: Invalid input(s): POCBNP CBG: No results for input(s): GLUCAP in the last 168 hours. HbA1C: No results for input(s): HGBA1C in the last 72 hours. Urine analysis:    Component Value Date/Time   COLORURINE YELLOW 06/11/2020 0942   APPEARANCEUR CLOUDY (A) 06/11/2020 0942   LABSPEC 1.010 06/11/2020 0942   PHURINE 5.0 06/11/2020 0942   GLUCOSEU NEGATIVE 06/11/2020 0942   HGBUR SMALL (A) 06/11/2020 0942   BILIRUBINUR NEGATIVE 06/11/2020 0942   KETONESUR NEGATIVE 06/11/2020 0942   PROTEINUR  100 (A) 06/11/2020 0942   NITRITE POSITIVE (A) 06/11/2020 0942   LEUKOCYTESUR LARGE (A) 06/11/2020 0942   Sepsis Labs: @LABRCNTIP (procalcitonin:4,lacticidven:4) ) Recent Results (from the past 240 hour(s))  Urine Culture     Status: Abnormal   Collection Time: 06/11/20  9:42 AM   Specimen: Urine, Clean Catch  Result Value Ref Range Status   Specimen Description   Final    URINE, CLEAN CATCH Performed at [facility], [address], [place], [place] [address]    Special Requests   Final    NONE Performed at [facility], [address], [place], [place] [address]    Culture >=100,000 COLONIES/mL ESCHERICHIA COLI (A)  Final   Report Status 06/13/2020 FINAL  Final   Organism ID, Bacteria ESCHERICHIA COLI (A)  Final      Susceptibility   Escherichia coli - MIC*    AMPICILLIN >=32 RESISTANT Resistant     CEFAZOLIN <=4 SENSITIVE Sensitive     CEFEPIME <=0.12 SENSITIVE Sensitive     CEFTRIAXONE <=0.25 SENSITIVE Sensitive     CIPROFLOXACIN >=4 RESISTANT Resistant  GENTAMICIN <=1 SENSITIVE Sensitive     IMIPENEM <=0.25 SENSITIVE Sensitive     NITROFURANTOIN <=16 SENSITIVE Sensitive     TRIMETH/SULFA >=320 RESISTANT Resistant     AMPICILLIN/SULBACTAM 16 INTERMEDIATE Intermediate     PIP/TAZO <=4 SENSITIVE Sensitive     * >=100,000 COLONIES/mL ESCHERICHIA COLI     Scheduled Meds: . vitamin C  500 mg Oral Daily  . aspirin EC  81 mg Oral QODAY  . cefdinir  300 mg Oral Q12H  . enoxaparin (LOVENOX) injection  65 mg Subcutaneous Q24H  . fluticasone furoate-vilanterol  1 puff Inhalation Daily  . guaiFENesin  600 mg Oral Daily  . hydroxychloroquine  200 mg Oral BID  . Ipratropium-Albuterol  1 puff Inhalation Q6H  . leflunomide  20 mg Oral q morning  . loratadine  10 mg Oral Daily  . pantoprazole  40 mg Oral Daily  . predniSONE  50 mg Oral Q breakfast  . rOPINIRole  0.25 mg Oral TID  . rosuvastatin  5 mg Oral q morning  . umeclidinium bromide  1 puff Inhalation Daily  . zinc  sulfate  220 mg Oral Daily   Continuous Infusions:  Procedures/Studies: CT Head Wo Contrast  Result Date: 06/11/2020 CLINICAL DATA:  79 year old female with weakness. Unable to get off of the toilet this morning. Suspected cerebral hemorrhage. EXAM: CT HEAD WITHOUT CONTRAST TECHNIQUE: Contiguous axial images were obtained from the base of the skull through the vertex without intravenous contrast. COMPARISON:  No priors. FINDINGS: Brain: Patchy and confluent areas of decreased attenuation are noted throughout the deep and periventricular white matter of the cerebral hemispheres bilaterally, compatible with chronic microvascular ischemic disease. More well-defined focus of low attenuation in the superior aspect of the left basal ganglia, compatible with an old lacunar infarct. No evidence of acute infarction, hemorrhage, hydrocephalus, extra-axial collection or mass lesion/mass effect. Vascular: No hyperdense vessel or unexpected calcification. Skull: Normal. Negative for fracture or focal lesion. Sinuses/Orbits: No acute finding. Other: None. IMPRESSION: 1. No acute intracranial abnormalities. Specifically, no evidence of acute intracranial hemorrhage. 2. Chronic microvascular ischemic changes in the cerebral white matter, and old left basal ganglia lacunar infarct, as above. Electronically Signed   By: Vinnie Langton M.D.   On: 06/11/2020 10:23   CT Lumbar Spine Wo Contrast  Result Date: 06/11/2020 CLINICAL DATA:  80 year old female with weakness. EXAM: CT LUMBAR SPINE WITHOUT CONTRAST TECHNIQUE: Multidetector CT imaging of the lumbar spine was performed without intravenous contrast administration. Multiplanar CT image reconstructions were also generated. COMPARISON:  Lumbar MRI 03/05/2014.  Chest CT 11/05/2019. FINDINGS: Segmentation: Normal. Alignment: Unchanged lumbar lordosis since 2015. No spondylolisthesis. Vertebrae: Generalized osteopenia. 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 Visible lower thoracic levels and ribs appear intact. Stable vertebral height since 2015. Visible sacrum and SI joints appear intact. No acute osseous abnormality identified. Paraspinal and other soft tissues: Negative visible lung bases. Aortoiliac calcified atherosclerosis. Vascular patency is not evaluated in the absence of IV contrast. Mildly tortuous infrarenal abdominal aorta. Negative visible other noncontrast abdominal viscera. Negative lumbar paraspinal soft tissues. Disc levels: Chronic lower lumbar spinal stenosis in part due to epidural lipomatosis appears stable since 2015. Multifactorial spinal stenosis at L4-L5 is mild-to-moderate related to disc bulging and posterior element hypertrophy. Right side vacuum facet at that level. No significant spinal stenosis above L4. Vacuum disc at L1-L2 and L2-L3 is chronic but progressed. IMPRESSION: 1. Osteopenia.  No acute osseous abnormality. 2. Chronic  lower lumbar spinal stenosis, in part due to epidural lipomatosis and mild to moderate at L4-L5, appears stable since a 2015 MRI. 3.  Aortic Atherosclerosis (ICD10-I70.0). Electronically Signed   By: Genevie Eisha M.D.   On: 06/11/2020 10:27   DG Chest Port 1 View  Result Date: 06/11/2020 CLINICAL DATA:  COVID-19 positive four days ago with worsening shortness of breath this morning. EXAM: PORTABLE CHEST 1 VIEW COMPARISON:  11/24/2017 FINDINGS: Lungs are adequately inflated without focal airspace consolidation or effusion. Mild chronic diffuse prominence of the bronchovascular markings. Mild stable cardiomegaly. Small rectangular metallic density projects over the left base of uncertain clinical significance. Remainder of the exam is unchanged. IMPRESSION: 1. No acute cardiopulmonary disease. 2. Mild stable cardiomegaly. Mild chronic diffuse interstitial changes. 3. Small metallic foreign body projecting over the left base. Recommend clinical correlation. Electronically  Signed   By: Marin Olp M.D.   On: 06/11/2020 15:42    Orson Eva, DO  [facility]  If 7PM-7AM, please contact night-coverage [email or web address] Password [facility] 06/13/2020, 6:48 PM   LOS: 2 days

## 2020-06-13 NOTE — Progress Notes (Signed)
Physical Therapy Treatment Patient Details Name: [name] MRN: [id number] DOB: Oct 24, 1940 Today's Date: 06/13/2020    History of Present Illness [name] is a 80 y.o. female with medical history significant of ILD/COPD with chronic hypoxic respiratory failure on 2 L at home, hypertension, restless leg syndrome, comes to the hospital with complaints of weakness.  Patient tells me that she has had dry cough over the last week and a half, and she had antigen testing in her home and tested positive for Covid last Thursday on 2/17.  She tells me that since then she has had significant weakness, in the last couple days she is barely able to ambulate.  She was also reporting burning with urination and a history of prior UTIs.  She denies any fever or chills, no abdominal pain, no nausea or vomiting.  She reports diarrhea which is now better since she hasn't been eating anything in the last couple of days due to poor appetite.  Reports that her breathing is at baseline, she has chronic shortness of breath but is feeling well on her chronic 2 L.  She was scheduled to receive outpatient Covid antibodies on 2/28.  She also reports that her legs have been swollen this morning but now resolved.  This is happening to her intermittently.    PT Comments    Patient demonstrates slow labored movement for sitting up at bedside with improvement for propping up on elbows during supine to sitting without having to use bed rail, transferred to [facility] using armrest without loss of balance, but required increased time.  Patient demonstrates increased endurance/distance for ambulation in room without loss of balance or c/o SOB and tolerated sitting up in chair after therapy - nursing staff notified.  Patient will benefit from continued physical therapy in hospital and recommended venue below to increase strength, balance, endurance for safe ADLs and gait.    Follow Up Recommendations  Home health PT;Supervision for  mobility/OOB;Supervision - Intermittent     Equipment Recommendations  None recommended by PT    Recommendations for Other Services       Precautions / Restrictions Precautions Precautions: Fall Restrictions Weight Bearing Restrictions: No    Mobility  Bed Mobility Overal bed mobility: Needs Assistance Bed Mobility: Supine to Sit     Supine to sit: Supervision     General bed mobility comments: increased time, slightly labored movement, required frequent rest breaks due to c/o lightheadedness    Transfers Overall transfer level: Needs assistance Equipment used: Rolling walker (2 wheeled);None Transfers: Sit to/from [employer] to Stand: Supervision;Min guard Stand pivot transfers: Supervision;Min guard       General transfer comment: increased time, labored movement  Ambulation/Gait Ambulation/Gait assistance: Supervision Gait Distance (Feet): 30 Feet Assistive device: Rolling walker (2 wheeled) Gait Pattern/deviations: Decreased step length - right;Decreased step length - left;Decreased stride length Gait velocity: decreased   General Gait Details: slightly labored cadence without loss of balance, limited mostly due to fatigue, on 2 LPM O2   Stairs             Wheelchair Mobility    Modified Rankin (Stroke Patients Only)       Balance Overall balance assessment: Needs assistance Sitting-balance support: Feet supported;No upper extremity supported Sitting balance-Leahy Scale: Good Sitting balance - Comments: seated at EOB   Standing balance support: During functional activity;Bilateral upper extremity supported Standing balance-Leahy Scale: Fair Standing balance comment: fair/good using RW  Cognition Arousal/Alertness: Awake/alert Behavior During Therapy: WFL for tasks assessed/performed Overall Cognitive Status: Within Functional Limits for tasks assessed                                         Exercises General Exercises - Lower Extremity Long Arc Quad: Seated;AROM;Strengthening;Both;15 reps Hip Flexion/Marching: Seated;AROM;Strengthening;Both;15 reps Toe Raises: Seated;AROM;Strengthening;Both;20 reps Heel Raises: Seated;AROM;Strengthening;Both;20 reps    General Comments        Pertinent Vitals/Pain Pain Assessment: No/denies pain    Home Living                      Prior Function            PT Goals (current goals can now be found in the care plan section) Acute Rehab PT Goals Patient Stated Goal: return home with family to assist PT Goal Formulation: With patient Time For Goal Achievement: 06/19/20 Potential to Achieve Goals: Good Progress towards PT goals: Progressing toward goals    Frequency    Min 3X/week      PT Plan      Co-evaluation              AM-PAC PT "6 Clicks" Mobility   Outcome Measure  Help needed turning from your back to your side while in a flat bed without using bedrails?: None Help needed moving from lying on your back to sitting on the side of a flat bed without using bedrails?: A Little Help needed moving to and from a bed to a chair (including a wheelchair)?: A Little Help needed standing up from a chair using your arms (e.g., wheelchair or bedside chair)?: A Little Help needed to walk in hospital room?: A Little Help needed climbing 3-5 steps with a railing? : A Lot 6 Click Score: 18    End of Session Equipment Utilized During Treatment: Oxygen Activity Tolerance: Patient tolerated treatment well Patient left: in chair;with call bell/phone within reach Nurse Communication: Mobility status PT Visit Diagnosis: Unsteadiness on feet (R26.81);Other abnormalities of gait and mobility (R26.89);Muscle weakness (generalized) (M62.81)     Time: 0981-1914 PT Time Calculation (min) (ACUTE ONLY): 34 min  Charges:  $Gait Training: 8-22 mins $Therapeutic Exercise: 8-22 mins                      10:47 AM, 06/13/20 Lonell Grandchild, MPT Physical Therapist with [facility] 336 [phone] office [phone] mobile phone

## 2020-06-14 LAB — COMPREHENSIVE METABOLIC PANEL
ALT: 18 U/L (ref 0–44)
AST: 26 U/L (ref 15–41)
Albumin: 2.7 g/dL — ABNORMAL LOW (ref 3.5–5.0)
Alkaline Phosphatase: 57 U/L (ref 38–126)
Anion gap: 9 (ref 5–15)
BUN: 17 mg/dL (ref 8–23)
CO2: 25 mmol/L (ref 22–32)
Calcium: 7.8 mg/dL — ABNORMAL LOW (ref 8.9–10.3)
Chloride: 106 mmol/L (ref 98–111)
Creatinine, Ser: 0.85 mg/dL (ref 0.44–1.00)
GFR, Estimated: >60 mL/min (ref >60)
Glucose, Bld: 152 mg/dL — ABNORMAL HIGH (ref 70–99)
Potassium: 4.5 mmol/L (ref 3.5–5.1)
Sodium: 140 mmol/L (ref 135–145)
Total Bilirubin: 0.5 mg/dL (ref 0.3–1.2)
Total Protein: 5.9 g/dL — ABNORMAL LOW (ref 6.5–8.1)

## 2020-06-14 LAB — CBC WITH DIFFERENTIAL/PLATELET
Abs Immature Granulocytes: 0.09 10*3/uL — ABNORMAL HIGH (ref 0.00–0.07)
Basophils Absolute: 0 10*3/uL (ref 0.0–0.1)
Basophils Relative: 0 %
Eosinophils Absolute: 0 10*3/uL (ref 0.0–0.5)
Eosinophils Relative: 0 %
HCT: 33.5 % — ABNORMAL LOW (ref 36.0–46.0)
Hemoglobin: 10.4 g/dL — ABNORMAL LOW (ref 12.0–15.0)
Immature Granulocytes: 1 %
Lymphocytes Relative: 10 %
Lymphs Abs: 1.2 10*3/uL (ref 0.7–4.0)
MCH: 31 pg (ref 26.0–34.0)
MCHC: 31 g/dL (ref 30.0–36.0)
MCV: 100 fL (ref 80.0–100.0)
Monocytes Absolute: 0.8 10*3/uL (ref 0.1–1.0)
Monocytes Relative: 7 %
Neutro Abs: 9.3 10*3/uL — ABNORMAL HIGH (ref 1.7–7.7)
Neutrophils Relative %: 82 %
Platelets: 161 10*3/uL (ref 150–400)
RBC: 3.35 MIL/uL — ABNORMAL LOW (ref 3.87–5.11)
RDW: 13.2 % (ref 11.5–15.5)
WBC: 11.4 10*3/uL — ABNORMAL HIGH (ref 4.0–10.5)
nRBC: 0 % (ref 0.0–0.2)

## 2020-06-14 LAB — D-DIMER, QUANTITATIVE: D-Dimer, Quant: 2.96 ug/mL-FEU — ABNORMAL HIGH (ref 0.00–0.50)

## 2020-06-14 LAB — C-REACTIVE PROTEIN: CRP: 6.4 mg/dL — ABNORMAL HIGH (ref <1.0)

## 2020-06-14 NOTE — NC FL2 (Signed)
[place] MEDICAID FL2 LEVEL OF CARE SCREENING TOOL     IDENTIFICATION  Patient Name: [name] Birthdate: 02-Apr-1941 Sex: female Admission Date (Current Location): 06/11/2020  [facility] and [place] Number:  [employer] and Address:  [place] [address] [place]      Provider Number: [id number]  Attending Physician Name and Address:  Barton Dubois, MD  Relative Name and Phone Number:  Darilyn, Storbeck [phone]   [phone]    Current Level of Care: Hospital Recommended Level of Care: [place] Prior Approval Number:    Date Approved/Denied:   PASRR Number: [id number]  Discharge Plan: SNF    Current Diagnoses: Patient Active Problem List   Diagnosis Date Noted  . Acute lower UTI 06/12/2020  . COVID-19 06/11/2020  . Chronic respiratory failure with hypoxia ([place]) 06/01/2019  . COPD mixed type ([place]) 03/14/2015  . Rheumatoid arthritis ([place]) 03/14/2015  . Dyspnea 02/06/2015  . ILD (interstitial lung disease) ([place]) 02/06/2015  . Cough 05/17/2011    Orientation RESPIRATION BLADDER Height & Weight     Self,Time,Situation,Place  O2 (2L) Continent Weight: 288 lb (130.6 kg) Height:  5\' 7"  (170.2 cm)  BEHAVIORAL SYMPTOMS/MOOD NEUROLOGICAL BOWEL NUTRITION STATUS      Continent Diet (regular)  AMBULATORY STATUS COMMUNICATION OF NEEDS Skin   Limited Assist Verbally Normal                       Personal Care Assistance Level of Assistance  Bathing,Feeding,Dressing Bathing Assistance: Limited assistance Feeding assistance: Independent Dressing Assistance: Limited assistance     Functional Limitations Info             SPECIAL CARE FACTORS FREQUENCY  PT (By licensed PT)     PT Frequency: 5x/week              Contractures Contractures Info: Not present    Additional Factors Info  Code Status,Allergies,Isolation Precautions Code Status Info: Full Code Allergies Info: Shellfish Allergy,  Atorvastatin, Sertralin Hcl, Macrolides and ketolides, Penicillins, Sulfa antibiotics     Isolation Precautions Info: 06/11/20 COVID +     Current Medications (06/14/2020):  This is the current hospital active medication list Current Facility-Administered Medications  Medication Dose Route Frequency Provider Last Rate Last Admin  . acetaminophen (TYLENOL) tablet 1,000 mg  1,000 mg Oral Q6H PRN Caren Griffins, MD      . ascorbic acid (VITAMIN C) tablet 500 mg  500 mg Oral Daily Caren Griffins, MD   500 mg at 06/14/20 1058  . aspirin EC tablet 81 mg  81 mg Oral Lavonda Jumbo, MD   81 mg at 06/13/20 0854  . cefdinir (OMNICEF) capsule 300 mg  300 mg Oral Therisa Doyne, MD   300 mg at 06/14/20 1058  . chlorpheniramine-HYDROcodone (TUSSIONEX) 10-8 MG/5ML suspension 5 mL  5 mL Oral Q12H PRN Caren Griffins, MD      . enoxaparin (LOVENOX) injection 65 mg  65 mg Subcutaneous Q24H Caren Griffins, MD   65 mg at 06/13/20 1717  . fluticasone furoate-vilanterol (BREO ELLIPTA) 200-25 MCG/INH 1 puff  1 puff Inhalation Daily Tat, David, MD   1 puff at 06/14/20 0805  . guaiFENesin (MUCINEX) 12 hr tablet 600 mg  600 mg Oral Daily Caren Griffins, MD   600 mg at 06/14/20 1058  . guaiFENesin-dextromethorphan (ROBITUSSIN DM) 100-10 MG/5ML syrup 10 mL  10 mL Oral Q4H PRN Gherghe,  Vella Redhead, MD   10 mL at 06/12/20 1825  . hydroxychloroquine (PLAQUENIL) tablet 200 mg  200 mg Oral BID Caren Griffins, MD   200 mg at 06/14/20 1058  . Ipratropium-Albuterol (COMBIVENT) respimat 1 puff  1 puff Inhalation Q6H Caren Griffins, MD   1 puff at 06/14/20 0805  . leflunomide (ARAVA) tablet 20 mg  20 mg Oral q morning Caren Griffins, MD   20 mg at 06/14/20 1100  . loratadine (CLARITIN) tablet 10 mg  10 mg Oral Daily Caren Griffins, MD   10 mg at 06/14/20 1058  . ondansetron (ZOFRAN) tablet 4 mg  4 mg Oral Q6H PRN Caren Griffins, MD       Or  . ondansetron (ZOFRAN) injection 4 mg  4 mg Intravenous  Q6H PRN Caren Griffins, MD   4 mg at 06/12/20 1005  . pantoprazole (PROTONIX) EC tablet 40 mg  40 mg Oral Daily Caren Griffins, MD   40 mg at 06/14/20 1058  . predniSONE (DELTASONE) tablet 50 mg  50 mg Oral Q breakfast Tat, David, MD   50 mg at 06/14/20 0756  . rOPINIRole (REQUIP) tablet 0.25 mg  0.25 mg Oral TID Caren Griffins, MD   0.25 mg at 06/14/20 1058  . rosuvastatin (CRESTOR) tablet 5 mg  5 mg Oral q morning Caren Griffins, MD   5 mg at 06/14/20 1104  . traMADol (ULTRAM) tablet 25-50 mg  25-50 mg Oral Q6H PRN Caren Griffins, MD   50 mg at 06/11/20 1834  . umeclidinium bromide (INCRUSE ELLIPTA) 62.5 MCG/INH 1 puff  1 puff Inhalation Daily Tat, Shanon Brow, MD   1 puff at 06/14/20 0805  . zinc sulfate capsule 220 mg  220 mg Oral Daily Caren Griffins, MD   220 mg at 06/14/20 1058     Discharge Medications: Please see discharge summary for a list of discharge medications.  Relevant Imaging Results:  Relevant Lab Results:   Additional Information SSN [id number] Clydene Pugh, LCSW

## 2020-06-14 NOTE — Care Management Important Message (Signed)
Important Message  Patient Details  Name: [name] MRN: [id number] Date of Birth: Mar 12, 1941   Medicare Important Message Given:  Yes - Important Message mailed due to current National Emergency     Tommy Medal 06/14/2020, 3:12 PM

## 2020-06-14 NOTE — Progress Notes (Signed)
Physical Therapy Treatment Patient Details Name: [name] MRN: [id number] DOB: 09/17/1940 Today's Date: 06/14/2020    History of Present Illness [name] is a 80 y.o. female with medical history significant of ILD/COPD with chronic hypoxic respiratory failure on 2 L at home, hypertension, restless leg syndrome, comes to the hospital with complaints of weakness.  Patient tells me that she has had dry cough over the last week and a half, and she had antigen testing in her home and tested positive for Covid last Thursday on 2/17.  She tells me that since then she has had significant weakness, in the last couple days she is barely able to ambulate.  She was also reporting burning with urination and a history of prior UTIs.  She denies any fever or chills, no abdominal pain, no nausea or vomiting.  She reports diarrhea which is now better since she hasn't been eating anything in the last couple of days due to poor appetite.  Reports that her breathing is at baseline, she has chronic shortness of breath but is feeling well on her chronic 2 L.  She was scheduled to receive outpatient Covid antibodies on 2/28.  She also reports that her legs have been swollen this morning but now resolved.  This is happening to her intermittently.    PT Comments    Patient has most difficulty completing sit to stands from commode in bathroom due to BLE weakness, requiring use of armrest of BSC and grab bar.  Patient demonstrates increased endurance/distance for gait training without loss of balance and tolerated sitting up in chair after therapy.  Patient will benefit from continued physical therapy in hospital and recommended venue below to increase strength, balance, endurance for safe ADLs and gait.    Follow Up Recommendations  Supervision for mobility/OOB;Supervision - Intermittent;SNF     Equipment Recommendations  3in1 (PT)    Recommendations for Other Services       Precautions / Restrictions  Precautions Precautions: Fall Restrictions Weight Bearing Restrictions: No    Mobility  Bed Mobility Overal bed mobility: Needs Assistance Bed Mobility: Supine to Sit     Supine to sit: HOB elevated;Supervision     General bed mobility comments: sllightly labored movement, required help to move bed sheets    Transfers Overall transfer level: Needs assistance Equipment used: Rolling walker (2 wheeled) Transfers: Sit to/from [employer] Transfers Sit to Stand: Supervision;Min guard Stand pivot transfers: Supervision;Min guard       General transfer comment: increased time, labored movement, has diffiuclty completing sit to stands from commode in bathroom due to BLE weakness  Ambulation/Gait Ambulation/Gait assistance: Supervision Gait Distance (Feet): 50 Feet Assistive device: Rolling walker (2 wheeled) Gait Pattern/deviations: Decreased step length - right;Decreased step length - left;Decreased stride length Gait velocity: decreased   General Gait Details: slightly labored cadence without loss of balance, limited mostly due to fatigue, on 2 LPM O2   Stairs             Wheelchair Mobility    Modified Rankin (Stroke Patients Only)       Balance Overall balance assessment: Needs assistance Sitting-balance support: Feet supported;No upper extremity supported Sitting balance-Leahy Scale: Good     Standing balance support: During functional activity;Bilateral upper extremity supported Standing balance-Leahy Scale: Fair Standing balance comment: fair/good using RW                            Cognition  Arousal/Alertness: Awake/alert Behavior During Therapy: WFL for tasks assessed/performed Overall Cognitive Status: Within Functional Limits for tasks assessed                                        Exercises General Exercises - Lower Extremity Long Arc Quad: Seated;AROM;Strengthening;Both;15 reps Hip Flexion/Marching:  Seated;AROM;Strengthening;Both;15 reps Toe Raises: Seated;AROM;Strengthening;Both;20 reps Heel Raises: Seated;AROM;Strengthening;Both;20 reps    General Comments        Pertinent Vitals/Pain Pain Assessment: No/denies pain    Home Living                      Prior Function            PT Goals (current goals can now be found in the care plan section) Acute Rehab PT Goals Patient Stated Goal: return home with family to assist Time For Goal Achievement: 06/19/20 Potential to Achieve Goals: Good Progress towards PT goals: Progressing toward goals    Frequency    Min 3X/week      PT Plan      Co-evaluation              AM-PAC PT "6 Clicks" Mobility   Outcome Measure  Help needed turning from your back to your side while in a flat bed without using bedrails?: None Help needed moving from lying on your back to sitting on the side of a flat bed without using bedrails?: A Little Help needed moving to and from a bed to a chair (including a wheelchair)?: A Little Help needed standing up from a chair using your arms (e.g., wheelchair or bedside chair)?: A Lot Help needed to walk in hospital room?: A Little Help needed climbing 3-5 steps with a railing? : A Lot 6 Click Score: 17    End of Session Equipment Utilized During Treatment: Oxygen Activity Tolerance: Patient tolerated treatment well Patient left: in chair;with call bell/phone within reach Nurse Communication: Mobility status PT Visit Diagnosis: Unsteadiness on feet (R26.81);Other abnormalities of gait and mobility (R26.89);Muscle weakness (generalized) (M62.81)     Time: 5681-2751 PT Time Calculation (min) (ACUTE ONLY): 28 min  Charges:  $Gait Training: 8-22 mins $Therapeutic Exercise: 8-22 mins                     11:26 AM, 06/14/20 Lonell Grandchild, MPT Physical Therapist with [facility] [phone] office [phone] mobile phone

## 2020-06-14 NOTE — Progress Notes (Signed)
PROGRESS NOTE  [name] IZT:[id number] DOB: [birth year] DOA: 06/11/2020 PCP: Lajean Manes, MD  Brief History:  80 year old female with a history of rheumatoid arthritis related ILD, COPD, chronic respiratory failure on 2 L, hypertension, GERD, hyperlipidemia, OSA on CPAP presenting with over 1 week history of generalized weakness and nonproductive cough with decreased oral intake.  The patient states that she took a home Covid 19 test which was positive on 06/01/2020.  Since then, her symptoms have progressed with nausea and decreased oral intake and loose stools.  She denies any hematochezia or melena.  She had one episode of emesis on the day of admission.  She denies any abdominal pain but complains of dysuria for the past week.  She denies any hemoptysis, chest pain, headache.  She has chronic shortness of breath which she states has not been any worse.  She has not had any fevers or chills.  Her generalized weakness has progressed to the point where she was having difficulty getting off the commode on the day of admission.  As result, EMS was activated. In the emergency department, the patient was afebrile hemodynamically stable with oxygen saturation down to 85% on 2 L.  The patient was increased to 3 L nasal cannula.  BMP showed a sodium 137, potassium 3.3, serum creatinine 1.12.  LFTs were unremarkable.  WBC 10.8, hemoglobin 11.5, platelets 124,000  Assessment/Plan: Generalized weakness/failure to thrive -Multifactorial including the patient's UTI and COVID-19 infection -Serum [id number] -Folic ZJQB--34.1 -PFX--9.024 -PT evaluation -Continue IV fluids  COVID-19 infection/gastroenteritis -Manifested by worsening nausea and loose stools -Finished 3 days of remdesivir -she has been vaccinated x 3 -Stable oxygen saturation -Experiencing intermittent coughing spells; otherwise asymptomatic.  UTI -d/c cipro -start cefdinir  Hypokalemia>>Hyperkalemic -Stable  currently -Continue to follow electrolytes trend and instability.  Hypomagnesemia -repleted -Continue to follow trend  COPD/interstitial lung disease/chronic respiratory failure with hypoxia -Patient follows pulm/Dr. Loanne Drilling -Continue Trelegy equivalent -Continue prednisone--double home dose temporarily for stress dosing -Patient is chronically on 2 L nasal cannula; currently stable -50 mg of prednisone on daily basis to be continued for now.  Rheumatoid arthritis -Patient follows Dr. Trudie Reed -Continue Arava and Plaquenil  Hypertension -Holding lisinopril temporarily secondary to dehydration  Hyperlipidemia -Continue statin  GERD -Continue PPI  Morbid obesity -BMI 45.11 -Low calorie diet, portion control and increase physical activity discussed with patient.   Status is: Inpatient  Remains inpatient appropriate because:IV treatments appropriate due to intensity of illness or inability to take PO   Dispo: The patient is from: Home  Anticipated d/c is to: SNF  Patient currently is not medically stable to d/c.              Difficult to place patient No     Family Communication:  Son updated 2/28  Consultants:  none  Code Status:  FULL   DVT Prophylaxis:  [place] Lovenox   Procedures: As Listed in Progress Note Above  Antibiotics: cipro 2/27>>3/1 Cefdinir 3/1>>    Subjective: Reports breathing okay, no experiencing chest pain, nausea, vomiting, abdominal pain no dysuria.  Expressed feeling weak and deconditioned.  Specifically experiencing a lot of trouble just getting out of the toilet.   Objective: Vitals:   06/13/20 2033 06/14/20 0534 06/14/20 0807 06/14/20 1434  BP: (!) 121/58 124/78  118/61  Pulse: 98 93  90  Resp: 19 19  20   Temp:  98 F (36.7 C)  98.3 F (36.8 C)  TempSrc:  Oral  SpO2: 97% 97% 97% 99%  Weight:      Height:       No intake or output data in the 24 hours ending 06/14/20  1907 Weight change:   Exam: General exam: Alert, awake, oriented x 3; reporting intermittent dry coughing spells; no chest pain, no nausea, no vomiting.  Feeling weak and deconditioned, specifically having difficulty getting out of the toilet.  Stable respiratory status and using chronic 2 L supplementation.   Respiratory system: No wheezing, positive scattered rhonchi.  No using accessory muscle. Cardiovascular system:RRR. No murmurs, rubs, gallops.  No JVD. Gastrointestinal system: Abdomen is obese, nondistended, soft and nontender. No organomegaly or masses felt. Normal bowel sounds heard. Central nervous system: Alert and oriented. No focal neurological deficits. Extremities: No cyanosis or clubbing. Skin: No rashes, no petechiae. Psychiatry: Judgement and insight appear normal. Mood & affect appropriate.    Data Reviewed: I have personally reviewed following labs and imaging studies  Basic Metabolic Panel: Recent Labs  Lab 06/11/20 1039 06/12/20 0549 06/13/20 0634 06/13/20 1410 06/14/20 0455  NA 137 140 142 142 140  K 3.3* 4.1 5.4* 4.6 4.5  CL 102 105 108 107 106  CO2 25 23 25 27 25   GLUCOSE 94 83 142* 142* 152*  BUN 22 18 16 15 17   CREATININE 1.12* 0.85 0.84 0.86 0.85  CALCIUM 8.1* 8.5* 8.2* 8.2* 7.8*  MG  --   --  1.5*  --   --    Liver Function Tests: Recent Labs  Lab 06/11/20 1039 06/12/20 0549 06/13/20 0634 06/14/20 0455  AST 28 25 25 26   ALT 15 15 16 18   ALKPHOS 58 51 55 57  BILITOT 1.0 0.5 0.5 0.5  PROT 6.6 5.7* 5.6* 5.9*  ALBUMIN 3.1* 2.7* 2.7* 2.7*   CBC: Recent Labs  Lab 06/11/20 1039 06/12/20 0549 06/13/20 0634 06/14/20 0455  WBC 10.8* 8.5 8.7 11.4*  NEUTROABS 6.9 4.4 6.0 9.3*  HGB 11.5* 10.3* 10.1* 10.4*  HCT 36.0 33.3* 31.8* 33.5*  MCV 97.6 100.3* 100.3* 100.0  PLT 124* 118* 126* 161   Urine analysis:    Component Value Date/Time   COLORURINE YELLOW 06/11/2020 0942   APPEARANCEUR CLOUDY (A) 06/11/2020 0942   LABSPEC 1.010  06/11/2020 0942   PHURINE 5.0 06/11/2020 0942   GLUCOSEU NEGATIVE 06/11/2020 0942   HGBUR SMALL (A) 06/11/2020 0942   BILIRUBINUR NEGATIVE 06/11/2020 [place] 06/11/2020 0942   PROTEINUR 100 (A) 06/11/2020 0942   NITRITE POSITIVE (A) 06/11/2020 0942   LEUKOCYTESUR LARGE (A) 06/11/2020 0942   Sepsis Labs: @LABRCNTIP (procalcitonin:4,lacticidven:4) ) Recent Results (from the past 240 hour(s))  Urine Culture     Status: Abnormal   Collection Time: 06/11/20  9:42 AM   Specimen: Urine, Clean Catch  Result Value Ref Range Status   Specimen Description   Final    URINE, CLEAN CATCH Performed at [facility], [address], [place], [place] [address]    Special Requests   Final    NONE Performed at [facility], [address], [place], [place] [address]    Culture >=100,000 COLONIES/mL ESCHERICHIA COLI (A)  Final   Report Status 06/13/2020 FINAL  Final   Organism ID, Bacteria ESCHERICHIA COLI (A)  Final      Susceptibility   Escherichia coli - MIC*    AMPICILLIN >=32 RESISTANT Resistant     CEFAZOLIN <=4 SENSITIVE Sensitive     CEFEPIME <=0.12 SENSITIVE Sensitive     CEFTRIAXONE <=0.25  SENSITIVE Sensitive     CIPROFLOXACIN >=4 RESISTANT Resistant     GENTAMICIN <=1 SENSITIVE Sensitive     IMIPENEM <=0.25 SENSITIVE Sensitive     NITROFURANTOIN <=16 SENSITIVE Sensitive     TRIMETH/SULFA >=320 RESISTANT Resistant     AMPICILLIN/SULBACTAM 16 INTERMEDIATE Intermediate     PIP/TAZO <=4 SENSITIVE Sensitive     * >=100,000 COLONIES/mL ESCHERICHIA COLI     Scheduled Meds: . vitamin C  500 mg Oral Daily  . aspirin EC  81 mg Oral QODAY  . cefdinir  300 mg Oral Q12H  . enoxaparin (LOVENOX) injection  65 mg Subcutaneous Q24H  . fluticasone furoate-vilanterol  1 puff Inhalation Daily  . guaiFENesin  600 mg Oral Daily  . hydroxychloroquine  200 mg Oral BID  . Ipratropium-Albuterol  1 puff Inhalation Q6H  . leflunomide  20 mg Oral q morning  . loratadine  10 mg Oral  Daily  . pantoprazole  40 mg Oral Daily  . predniSONE  50 mg Oral Q breakfast  . rOPINIRole  0.25 mg Oral TID  . rosuvastatin  5 mg Oral q morning  . umeclidinium bromide  1 puff Inhalation Daily  . zinc sulfate  220 mg Oral Daily   Continuous Infusions:  Procedures/Studies: CT Head Wo Contrast  Result Date: 06/11/2020 CLINICAL DATA:  80 year old female with weakness. Unable to get off of the toilet this morning. Suspected cerebral hemorrhage. EXAM: CT HEAD WITHOUT CONTRAST TECHNIQUE: Contiguous axial images were obtained from the base of the skull through the vertex without intravenous contrast. COMPARISON:  No priors. FINDINGS: Brain: Patchy and confluent areas of decreased attenuation are noted throughout the deep and periventricular white matter of the cerebral hemispheres bilaterally, compatible with chronic microvascular ischemic disease. More well-defined focus of low attenuation in the superior aspect of the left basal ganglia, compatible with an old lacunar infarct. No evidence of acute infarction, hemorrhage, hydrocephalus, extra-axial collection or mass lesion/mass effect. Vascular: No hyperdense vessel or unexpected calcification. Skull: Normal. Negative for fracture or focal lesion. Sinuses/Orbits: No acute finding. Other: None. IMPRESSION: 1. No acute intracranial abnormalities. Specifically, no evidence of acute intracranial hemorrhage. 2. Chronic microvascular ischemic changes in the cerebral white matter, and old left basal ganglia lacunar infarct, as above. Electronically Signed   By: Vinnie Langton M.D.   On: 06/11/2020 10:23   CT Lumbar Spine Wo Contrast  Result Date: 06/11/2020 CLINICAL DATA:  80 year old female with weakness. EXAM: CT LUMBAR SPINE WITHOUT CONTRAST TECHNIQUE: Multidetector CT imaging of the lumbar spine was performed without intravenous contrast administration. Multiplanar CT image reconstructions were also generated. COMPARISON:  Lumbar MRI 03/05/2014.  Chest  CT 11/05/2019. FINDINGS: Segmentation: Normal. Alignment: Unchanged lumbar lordosis since 2015. No spondylolisthesis. Vertebrae: Generalized osteopenia. 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 Visible lower thoracic levels and ribs appear intact. Stable vertebral height since 2015. Visible sacrum and SI joints appear intact. No acute osseous abnormality identified. Paraspinal and other soft tissues: Negative visible lung bases. Aortoiliac calcified atherosclerosis. Vascular patency is not evaluated in the absence of IV contrast. Mildly tortuous infrarenal abdominal aorta. Negative visible other noncontrast abdominal viscera. Negative lumbar paraspinal soft tissues. Disc levels: Chronic lower lumbar spinal stenosis in part due to epidural lipomatosis appears stable since 2015. Multifactorial spinal stenosis at L4-L5 is mild-to-moderate related to disc bulging and posterior element hypertrophy. Right side vacuum facet at that level. No significant spinal stenosis above L4. Vacuum disc at L1-L2 and L2-L3  is chronic but progressed. IMPRESSION: 1. Osteopenia.  No acute osseous abnormality. 2. Chronic lower lumbar spinal stenosis, in part due to epidural lipomatosis and mild to moderate at L4-L5, appears stable since a 2015 MRI. 3.  Aortic Atherosclerosis (ICD10-I70.0). Electronically Signed   By: Genevie Idona M.D.   On: 06/11/2020 10:27   DG Chest Port 1 View  Result Date: 06/11/2020 CLINICAL DATA:  COVID-19 positive four days ago with worsening shortness of breath this morning. EXAM: PORTABLE CHEST 1 VIEW COMPARISON:  11/24/2017 FINDINGS: Lungs are adequately inflated without focal airspace consolidation or effusion. Mild chronic diffuse prominence of the bronchovascular markings. Mild stable cardiomegaly. Small rectangular metallic density projects over the left base of uncertain clinical significance. Remainder of the exam is unchanged. IMPRESSION: 1. No acute  cardiopulmonary disease. 2. Mild stable cardiomegaly. Mild chronic diffuse interstitial changes. 3. Small metallic foreign body projecting over the left base. Recommend clinical correlation. Electronically Signed   By: Marin Olp M.D.   On: 06/11/2020 15:42    Barton Dubois, MD  [facility]  If 7PM-7AM, please contact night-coverage [email or web address] Password [email or web address] 06/14/2020, 7:07 PM   LOS: 3 days

## 2020-06-15 DIAGNOSIS — K529 Noninfective gastroenteritis and colitis, unspecified: Secondary | ICD-10-CM | POA: Diagnosis not present

## 2020-06-15 DIAGNOSIS — G4733 Obstructive sleep apnea (adult) (pediatric): Secondary | ICD-10-CM | POA: Diagnosis not present

## 2020-06-15 DIAGNOSIS — E876 Hypokalemia: Secondary | ICD-10-CM | POA: Diagnosis not present

## 2020-06-15 DIAGNOSIS — U071 COVID-19: Secondary | ICD-10-CM | POA: Diagnosis not present

## 2020-06-15 DIAGNOSIS — E785 Hyperlipidemia, unspecified: Secondary | ICD-10-CM | POA: Diagnosis not present

## 2020-06-15 DIAGNOSIS — R0989 Other specified symptoms and signs involving the circulatory and respiratory systems: Secondary | ICD-10-CM | POA: Diagnosis not present

## 2020-06-15 DIAGNOSIS — M6281 Muscle weakness (generalized): Secondary | ICD-10-CM | POA: Diagnosis not present

## 2020-06-15 DIAGNOSIS — J9611 Chronic respiratory failure with hypoxia: Secondary | ICD-10-CM | POA: Diagnosis not present

## 2020-06-15 DIAGNOSIS — G2581 Restless legs syndrome: Secondary | ICD-10-CM | POA: Diagnosis not present

## 2020-06-15 DIAGNOSIS — Z111 Encounter for screening for respiratory tuberculosis: Secondary | ICD-10-CM | POA: Diagnosis not present

## 2020-06-15 DIAGNOSIS — K219 Gastro-esophageal reflux disease without esophagitis: Secondary | ICD-10-CM | POA: Diagnosis not present

## 2020-06-15 DIAGNOSIS — R279 Unspecified lack of coordination: Secondary | ICD-10-CM | POA: Diagnosis not present

## 2020-06-15 DIAGNOSIS — I10 Essential (primary) hypertension: Secondary | ICD-10-CM | POA: Diagnosis not present

## 2020-06-15 DIAGNOSIS — J449 Chronic obstructive pulmonary disease, unspecified: Secondary | ICD-10-CM | POA: Diagnosis not present

## 2020-06-15 DIAGNOSIS — R531 Weakness: Secondary | ICD-10-CM | POA: Diagnosis not present

## 2020-06-15 DIAGNOSIS — Z9289 Personal history of other medical treatment: Secondary | ICD-10-CM | POA: Diagnosis not present

## 2020-06-15 DIAGNOSIS — R41841 Cognitive communication deficit: Secondary | ICD-10-CM | POA: Diagnosis not present

## 2020-06-15 DIAGNOSIS — M0579 Rheumatoid arthritis with rheumatoid factor of multiple sites without organ or systems involvement: Secondary | ICD-10-CM | POA: Diagnosis not present

## 2020-06-15 DIAGNOSIS — R2681 Unsteadiness on feet: Secondary | ICD-10-CM | POA: Diagnosis not present

## 2020-06-15 DIAGNOSIS — Z7401 Bed confinement status: Secondary | ICD-10-CM | POA: Diagnosis not present

## 2020-06-15 DIAGNOSIS — N179 Acute kidney failure, unspecified: Secondary | ICD-10-CM | POA: Diagnosis not present

## 2020-06-15 DIAGNOSIS — B9620 Unspecified Escherichia coli [E. coli] as the cause of diseases classified elsewhere: Secondary | ICD-10-CM | POA: Diagnosis not present

## 2020-06-15 DIAGNOSIS — N390 Urinary tract infection, site not specified: Secondary | ICD-10-CM | POA: Diagnosis not present

## 2020-06-15 DIAGNOSIS — J849 Interstitial pulmonary disease, unspecified: Secondary | ICD-10-CM | POA: Diagnosis not present

## 2020-06-15 LAB — CBC WITH DIFFERENTIAL/PLATELET
Abs Immature Granulocytes: 0.13 10*3/uL — ABNORMAL HIGH (ref 0.00–0.07)
Basophils Absolute: 0.1 10*3/uL (ref 0.0–0.1)
Basophils Relative: 1 %
Eosinophils Absolute: 0 10*3/uL (ref 0.0–0.5)
Eosinophils Relative: 0 %
HCT: 33.9 % — ABNORMAL LOW (ref 36.0–46.0)
Hemoglobin: 10.6 g/dL — ABNORMAL LOW (ref 12.0–15.0)
Immature Granulocytes: 1 %
Lymphocytes Relative: 15 %
Lymphs Abs: 1.9 10*3/uL (ref 0.7–4.0)
MCH: 31.4 pg (ref 26.0–34.0)
MCHC: 31.3 g/dL (ref 30.0–36.0)
MCV: 100.3 fL — ABNORMAL HIGH (ref 80.0–100.0)
Monocytes Absolute: 1.3 10*3/uL — ABNORMAL HIGH (ref 0.1–1.0)
Monocytes Relative: 11 %
Neutro Abs: 8.9 10*3/uL — ABNORMAL HIGH (ref 1.7–7.7)
Neutrophils Relative %: 72 %
Platelets: 184 10*3/uL (ref 150–400)
RBC: 3.38 MIL/uL — ABNORMAL LOW (ref 3.87–5.11)
RDW: 13.4 % (ref 11.5–15.5)
WBC: 12.3 10*3/uL — ABNORMAL HIGH (ref 4.0–10.5)
nRBC: 0 % (ref 0.0–0.2)

## 2020-06-15 LAB — COMPREHENSIVE METABOLIC PANEL
ALT: 20 U/L (ref 0–44)
AST: 27 U/L (ref 15–41)
Albumin: 2.9 g/dL — ABNORMAL LOW (ref 3.5–5.0)
Alkaline Phosphatase: 55 U/L (ref 38–126)
Anion gap: 9 (ref 5–15)
BUN: 22 mg/dL (ref 8–23)
CO2: 27 mmol/L (ref 22–32)
Calcium: 7.8 mg/dL — ABNORMAL LOW (ref 8.9–10.3)
Chloride: 104 mmol/L (ref 98–111)
Creatinine, Ser: 0.86 mg/dL (ref 0.44–1.00)
GFR, Estimated: >60 mL/min (ref >60)
Glucose, Bld: 124 mg/dL — ABNORMAL HIGH (ref 70–99)
Potassium: 4.4 mmol/L (ref 3.5–5.1)
Sodium: 140 mmol/L (ref 135–145)
Total Bilirubin: 0.5 mg/dL (ref 0.3–1.2)
Total Protein: 6 g/dL — ABNORMAL LOW (ref 6.5–8.1)

## 2020-06-15 LAB — C-REACTIVE PROTEIN: CRP: 3.6 mg/dL — ABNORMAL HIGH (ref <1.0)

## 2020-06-15 LAB — D-DIMER, QUANTITATIVE: D-Dimer, Quant: 2.72 ug/mL-FEU — ABNORMAL HIGH (ref 0.00–0.50)

## 2020-06-15 MED ORDER — ZINC SULFATE 220 (50 ZN) MG PO CAPS: 220.0000 mg | ORAL_CAPSULE | Freq: Every day | ORAL | Status: DC

## 2020-06-15 MED ORDER — PREDNISONE 5 MG PO TABS: 7.5000 mg | ORAL_TABLET | Freq: Every day | ORAL | Status: DC

## 2020-06-15 MED ORDER — GUAIFENESIN-DM 100-10 MG/5ML PO SYRP: 10.0000 mL | ORAL_SOLUTION | ORAL | 0 refills | Status: DC | PRN

## 2020-06-15 MED ORDER — TRAMADOL HCL 50 MG PO TABS: 50.0000 mg | ORAL_TABLET | Freq: Three times a day (TID) | ORAL | 0 refills | Status: DC | PRN

## 2020-06-15 MED ORDER — CEFDINIR 300 MG PO CAPS: 300.0000 mg | ORAL_CAPSULE | Freq: Two times a day (BID) | ORAL | 0 refills | Status: AC

## 2020-06-15 MED ORDER — METOPROLOL TARTRATE 5 MG/5ML IV SOLN
5.0000 mg | Freq: Once | INTRAVENOUS | Status: AC
  Administered 2020-06-15: 5 mg via INTRAVENOUS
  Filled 2020-06-15: qty 5

## 2020-06-15 MED ORDER — PREDNISONE 20 MG PO TABS: ORAL_TABLET | ORAL | Status: DC

## 2020-06-15 NOTE — Plan of Care (Signed)

## 2020-06-15 NOTE — Discharge Summary (Signed)
Physician Discharge Summary  ALYSIANA ETHRIDGE VEH:[id number] DOB: [birth year] DOA: 06/11/2020  PCP: Lajean Manes, MD  Admit date: 06/11/2020 Discharge date: 06/15/2020  Time spent: 35 minutes  Recommendations for Outpatient Follow-up:  Take medications as prescribed Maintain adequate hydration Follow heart healthy/low-sodium diet Continue to practice the 3 W's: Wear mask, Wait 6 feet apart and Wash hands frequently. Rehabilitation and physical therapy as per Skilled nursing facility protocol. Replace metabolic panel to follow electrolytes and renal function.  Discharge Diagnoses:  Active Problems:   ILD (interstitial lung disease) ([place])   Chronic respiratory failure with hypoxia ([facility])   COVID-19   Acute lower UTI Transient isolated atrial fibrillation.  Discharge Condition: Stable and improved.  Discharged home with instruction to follow-up with PCP as an outpatient.  Diet recommendation: Heart healthy and low calorie diet.  Filed Weights   06/11/20 0914  Weight: 130.6 kg    History of present illness:  80 year old female with a history of rheumatoid arthritis related ILD, COPD, chronic respiratory failure on 2 L, hypertension, GERD, hyperlipidemia, OSA on CPAP presenting with over 1 week history of generalized weakness and nonproductive cough with decreased oral intake. The patient states that she took a home Covid 19 test which was positive on 06/01/2020. Since then, her symptoms have progressed with nausea and decreased oral intake and loose stools. She denies any hematochezia or melena. She had one episode of emesis on the day of admission. She denies any abdominal pain but complains of dysuria for the past week. She denies any hemoptysis, chest pain, headache. She has chronic shortness of breath which she states has not been any worse. She has not had any fevers or chills. Her generalized weakness has progressed to the point where she was having difficulty getting off the  commode on the day of admission. As result, EMS was activated. In the emergency department, the patient was afebrile hemodynamically stable with oxygen saturation down to 85% on 2 L. The patient was increased to 3 L nasal cannula. BMP showed a sodium 137, potassium 3.3, serum creatinine 1.12. LFTs were unremarkable. WBC 10.8, hemoglobin 11.5, platelets 124,000   Hospital Course:  Generalized weakness/failure to thrive/acute kidney injury -Multifactorial including the patient's UTI and COVID-19 infection -Serum E36--6294 -Folic TMLY--65.0 -PTW--6.568 -PT evaluation -Continue to maintain adequate hydration -Renal function within normal limits at discharge.  COVID-19 infection/gastroenteritis -Manifested by worsening nausea and loose stools -Finished 3 days of remdesivir -she has been vaccinated x 3 -Stable oxygen saturation on chronic 2 L supplementation. -Experiencing intermittent coughing spells; otherwise asymptomatic. -Continue as needed Robitussin  E. coli UTI -d/c cipro -start cefdinir -3 more days of antibiotics by mouth daily at discharge.  Hypokalemia>>Hyperkalemic -Stable currently -Continue to follow electrolytes trend and instability.  Hypomagnesemia -repleted -Continue to follow trend  COPD/interstitial lung disease/chronic respiratory failure with hypoxia -Patient followspulm/Dr. Loanne Drilling -Continue home inhaler/nebulizer management. -No wheezing no crackles at discharge. -Continue prednisone--double home dose temporarily for stress dosing -Patient is chronically on 2 L nasal cannula; currently stable - continue steroids tapering.  Rheumatoid arthritis -Patient follows Dr. Trudie Reed -Continue Arava and Plaquenil  Hypertension -Resume home antihypertensive agents.  Hyperlipidemia -Continue statin.  GERD -Continue PPI  Morbid obesity -BMI 45.11 -Low calorie diet, portion control and increase physical activity discussed with  patient.  Prevention isolated episode of atrial fibrillation -In the setting of bronchodilator treatments -Subsided and resolved after one-time dose of metoprolol. -Patient remains with rate control.  Procedures: See below for x-ray reports.  Consultations:  None  Discharge Exam: Vitals:   06/15/20 1106 06/15/20 1357  BP: 118/65 (!) 138/91  Pulse: 83 90  Resp:  16  Temp:  98.2 F (36.8 C)  SpO2: 97% 97%    General exam: Alert, awake, oriented x 3; reporting intermittent dry coughing spells; no chest pain, no nausea, no vomiting.  Feeling weak and deconditioned, specifically having difficulty getting out of the toilet.  Stable respiratory status and using chronic 2 L supplementation.   Respiratory system: No wheezing, positive scattered rhonchi.  No using accessory muscle. Cardiovascular system:RRR. No murmurs, rubs, gallops.  No JVD. Gastrointestinal system: Abdomen is obese, nondistended, soft and nontender. No organomegaly or masses felt. Normal bowel sounds heard. Central nervous system: Alert and oriented. No focal neurological deficits. Extremities: No cyanosis or clubbing. Skin: No rashes, no petechiae. Psychiatry: Judgement and insight appear normal. Mood & affect appropriate.   Discharge Instructions   Discharge Instructions    Diet - low sodium heart healthy   Complete by: As directed    Discharge instructions   Complete by: As directed    Take medications as prescribed Maintain adequate hydration Follow heart healthy/low-sodium diet Continue to practice the 3 W's: Wear mask, Wait 6 feet apart and Wash hands frequently. Rehabilitation and physical therapy as per Skilled nursing facility protocol. Replace metabolic panel to follow electrolytes and renal function.     Allergies as of 06/15/2020      Reactions   Shellfish Allergy Anaphylaxis   With vomiting and diarrhea   Atorvastatin Other (See Comments)   Leg muscle pain   Sertraline Hcl    Unknown  reaction   Macrolides And Ketolides Rash   Penicillins Other (See Comments)   Facial numbness   Sulfa Antibiotics Other (See Comments)   Facial numbness      Medication List    STOP taking these medications   guaiFENesin 600 MG 12 hr tablet Commonly known as: MUCINEX     TAKE these medications   acetaminophen 500 MG tablet Commonly known as: TYLENOL Take 1,000 mg by mouth every 6 (six) hours as needed for moderate pain.   alendronate 70 MG tablet Commonly known as: FOSAMAX Take 70 mg by mouth once a week.   aspirin 81 MG tablet Take 81 mg by mouth every other day.   Calcium Carbonate-Vitamin D 600-200 MG-UNIT Tabs Take 1 tablet by mouth daily.   cefdinir 300 MG capsule Commonly known as: OMNICEF Take 1 capsule (300 mg total) by mouth every 12 (twelve) hours for 3 days.   guaiFENesin-dextromethorphan 100-10 MG/5ML syrup Commonly known as: ROBITUSSIN DM Take 10 mLs by mouth every 4 (four) hours as needed for cough.   hydroxychloroquine 200 MG tablet Commonly known as: PLAQUENIL Take 200 mg by mouth 2 (two) times daily.   leflunomide 20 MG tablet Commonly known as: ARAVA Take 20 mg by mouth every morning.   levalbuterol 1.25 MG/3ML nebulizer solution Commonly known as: XOPENEX Take 1.25 mg by nebulization 3 (three) times daily. What changed:   when to take this  reasons to take this   lisinopril 5 MG tablet Commonly known as: ZESTRIL Take 5 mg by mouth daily.   loratadine 10 MG tablet Commonly known as: CLARITIN Take 10 mg by mouth daily.   omeprazole 40 MG capsule Commonly known as: PRILOSEC Take 1 capsule by mouth daily.   OXYGEN Inhale into the lungs. 2 lpm with rest and exertion   predniSONE 5 MG tablet Commonly known as: DELTASONE Take 1.5 tablets (  7.5 mg total) by mouth daily with breakfast. To be resume after completing tapering steroids. What changed: additional instructions   predniSONE 20 MG tablet Commonly known as: DELTASONE 2  tablet by mouth daily x2 days; then 1 tablet by mouth daily x3 days; then half tablet by mouth daily x3 days and then resume chronic 7.5 mg prednisone daily. What changed: You were already taking a medication with the same name, and this prescription was added. Make sure you understand how and when to take each.   rOPINIRole 0.25 MG tablet Commonly known as: REQUIP Take 0.25 mg by mouth 3 (three) times daily.   rosuvastatin 5 MG tablet Commonly known as: CRESTOR Take 5 mg by mouth every morning.   traMADol 50 MG tablet Commonly known as: ULTRAM Take 1-2 tablets (50-100 mg total) by mouth every 8 (eight) hours as needed for moderate pain. Take 1/2 to 1 tablet TID as needed with Extra Strength Tylenol What changed:   how much to take  how to take this  when to take this  reasons to take this  additional instructions   Trelegy Ellipta 200-62.5-25 MCG/INH Aepb Generic drug: Fluticasone-Umeclidin-Vilant Inhale 1 puff into the lungs daily.   vitamin C 1000 MG tablet Take 1,000 mg by mouth daily.   zinc sulfate 220 (50 Zn) MG capsule Take 1 capsule (220 mg total) by mouth daily. Start taking on: June 16, 2020      Allergies  Allergen Reactions  . Shellfish Allergy Anaphylaxis    With vomiting and diarrhea  . Atorvastatin Other (See Comments)    Leg muscle pain  . Sertraline Hcl     Unknown reaction  . Macrolides And Ketolides Rash  . Penicillins Other (See Comments)    Facial numbness  . Sulfa Antibiotics Other (See Comments)    Facial numbness    Contact information for follow-up providers    Stoneking, Hal, MD. Schedule an appointment as soon as possible for a visit in 10 day(s).   Specialty: Internal Medicine Why: after discharge from SNF. Contact information: [address] [place] [address] [phone]            Contact information for after-discharge care    Destination    [facility] Preferred SNF .   Service:  Skilled Nursing Contact information: [address] [place] [phone]                  The results of significant diagnostics from this hospitalization (including imaging, microbiology, ancillary and laboratory) are listed below for reference.    Significant Diagnostic Studies: CT Head Wo Contrast  Result Date: 06/11/2020 CLINICAL DATA:  80 year old female with weakness. Unable to get off of the toilet this morning. Suspected cerebral hemorrhage. EXAM: CT HEAD WITHOUT CONTRAST TECHNIQUE: Contiguous axial images were obtained from the base of the skull through the vertex without intravenous contrast. COMPARISON:  No priors. FINDINGS: Brain: Patchy and confluent areas of decreased attenuation are noted throughout the deep and periventricular white matter of the cerebral hemispheres bilaterally, compatible with chronic microvascular ischemic disease. More well-defined focus of low attenuation in the superior aspect of the left basal ganglia, compatible with an old lacunar infarct. No evidence of acute infarction, hemorrhage, hydrocephalus, extra-axial collection or mass lesion/mass effect. Vascular: No hyperdense vessel or unexpected calcification. Skull: Normal. Negative for fracture or focal lesion. Sinuses/Orbits: No acute finding. Other: None. IMPRESSION: 1. No acute intracranial abnormalities. Specifically, no evidence  of acute intracranial hemorrhage. 2. Chronic microvascular ischemic changes in the cerebral white matter, and old left basal ganglia lacunar infarct, as above. Electronically Signed   By: Vinnie Langton M.D.   On: 06/11/2020 10:23   CT Lumbar Spine Wo Contrast  Result Date: 06/11/2020 CLINICAL DATA:  80 year old female with weakness. EXAM: CT LUMBAR SPINE WITHOUT CONTRAST TECHNIQUE: Multidetector CT imaging of the lumbar spine was performed without intravenous contrast administration. Multiplanar CT image reconstructions were also  generated. COMPARISON:  Lumbar MRI 03/05/2014.  Chest CT 11/05/2019. FINDINGS: Segmentation: Normal. Alignment: Unchanged lumbar lordosis since 2015. No spondylolisthesis. Vertebrae: Generalized osteopenia. 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 Visible lower thoracic levels and ribs appear intact. Stable vertebral height since 2015. Visible sacrum and SI joints appear intact. No acute osseous abnormality identified. Paraspinal and other soft tissues: Negative visible lung bases. Aortoiliac calcified atherosclerosis. Vascular patency is not evaluated in the absence of IV contrast. Mildly tortuous infrarenal abdominal aorta. Negative visible other noncontrast abdominal viscera. Negative lumbar paraspinal soft tissues. Disc levels: Chronic lower lumbar spinal stenosis in part due to epidural lipomatosis appears stable since 2015. Multifactorial spinal stenosis at L4-L5 is mild-to-moderate related to disc bulging and posterior element hypertrophy. Right side vacuum facet at that level. No significant spinal stenosis above L4. Vacuum disc at L1-L2 and L2-L3 is chronic but progressed. IMPRESSION: 1. Osteopenia.  No acute osseous abnormality. 2. Chronic lower lumbar spinal stenosis, in part due to epidural lipomatosis and mild to moderate at L4-L5, appears stable since a 2015 MRI. 3.  Aortic Atherosclerosis (ICD10-I70.0). Electronically Signed   By: Genevie Joscelin M.D.   On: 06/11/2020 10:27   DG Chest Port 1 View  Result Date: 06/11/2020 CLINICAL DATA:  COVID-19 positive four days ago with worsening shortness of breath this morning. EXAM: PORTABLE CHEST 1 VIEW COMPARISON:  11/24/2017 FINDINGS: Lungs are adequately inflated without focal airspace consolidation or effusion. Mild chronic diffuse prominence of the bronchovascular markings. Mild stable cardiomegaly. Small rectangular metallic density projects over the left base of uncertain clinical significance.  Remainder of the exam is unchanged. IMPRESSION: 1. No acute cardiopulmonary disease. 2. Mild stable cardiomegaly. Mild chronic diffuse interstitial changes. 3. Small metallic foreign body projecting over the left base. Recommend clinical correlation. Electronically Signed   By: Marin Olp M.D.   On: 06/11/2020 15:42    Microbiology: Recent Results (from the past 240 hour(s))  Urine Culture     Status: Abnormal   Collection Time: 06/11/20  9:42 AM   Specimen: Urine, Clean Catch  Result Value Ref Range Status   Specimen Description   Final    URINE, CLEAN CATCH Performed at [facility], [address], [place], [place] [address]    Special Requests   Final    NONE Performed at [facility], [address] [place], [place] [address]    Culture >=100,000 COLONIES/mL ESCHERICHIA COLI (A)  Final   Report Status 06/13/2020 FINAL  Final   Organism ID, Bacteria ESCHERICHIA COLI (A)  Final      Susceptibility   Escherichia coli - MIC*    AMPICILLIN >=32 RESISTANT Resistant     CEFAZOLIN <=4 SENSITIVE Sensitive     CEFEPIME <=0.12 SENSITIVE Sensitive     CEFTRIAXONE <=0.25 SENSITIVE Sensitive     CIPROFLOXACIN >=4 RESISTANT Resistant     GENTAMICIN <=1 SENSITIVE Sensitive     IMIPENEM <=0.25 SENSITIVE Sensitive     NITROFURANTOIN <=16 SENSITIVE Sensitive  TRIMETH/SULFA >=320 RESISTANT Resistant     AMPICILLIN/SULBACTAM 16 INTERMEDIATE Intermediate     PIP/TAZO <=4 SENSITIVE Sensitive     * >=100,000 COLONIES/mL ESCHERICHIA COLI     Labs: Basic Metabolic Panel: Recent Labs  Lab 06/12/20 0549 06/13/20 0634 06/13/20 1410 06/14/20 0455 06/15/20 0451  NA 140 142 142 140 140  K 4.1 5.4* 4.6 4.5 4.4  CL 105 108 107 106 104  CO2 23 25 27 25 27   GLUCOSE 83 142* 142* 152* 124*  BUN 18 16 15 17 22   CREATININE 0.85 0.84 0.86 0.85 0.86  CALCIUM 8.5* 8.2* 8.2* 7.8* 7.8*  MG  --  1.5*  --   --   --    Liver Function Tests: Recent Labs  Lab 06/11/20 1039 06/12/20 0549  06/13/20 0634 06/14/20 0455 06/15/20 0451  AST 28 25 25 26 27   ALT 15 15 16 18 20   ALKPHOS 58 51 55 57 55  BILITOT 1.0 0.5 0.5 0.5 0.5  PROT 6.6 5.7* 5.6* 5.9* 6.0*  ALBUMIN 3.1* 2.7* 2.7* 2.7* 2.9*   CBC: Recent Labs  Lab 06/11/20 1039 06/12/20 0549 06/13/20 0634 06/14/20 0455 06/15/20 0451  WBC 10.8* 8.5 8.7 11.4* 12.3*  NEUTROABS 6.9 4.4 6.0 9.3* 8.9*  HGB 11.5* 10.3* 10.1* 10.4* 10.6*  HCT 36.0 33.3* 31.8* 33.5* 33.9*  MCV 97.6 100.3* 100.3* 100.0 100.3*  PLT 124* 118* 126* 161 184    Signed:  Barton Dubois MD.  [facility] 06/15/2020, 2:11 PM

## 2020-06-15 NOTE — Progress Notes (Signed)
Cardiac Monitoring Event  Dysrhythmia:  Atrial fibrillation  Symptoms:  Asymptomatic  Level of Consciousness:  Alert  Last set of vital signs taken:  Temp: 98.9 F (37.2 C)  Pulse Rate: 99  Resp: 20  BP: 123/72  SpO2: 98 %  Name of MD Notified:  Dyann Kief  Time MD Notified:  [id number]  Comments/Actions Taken:  New order, metoprolol IV administered

## 2020-06-15 NOTE — Progress Notes (Signed)
Patient is alert and oriented. Skin dry and intact. No c/o pain or discomfort voiced. [facility] in to transport patient. Patient ambulated from bed to stretcher. Belongings left with patient during departure.

## 2020-06-16 ENCOUNTER — Non-Acute Institutional Stay (SKILLED_NURSING_FACILITY): Payer: PPO | Admitting: Adult Health

## 2020-06-16 ENCOUNTER — Encounter: Payer: Self-pay | Admitting: Adult Health

## 2020-06-16 DIAGNOSIS — M0579 Rheumatoid arthritis with rheumatoid factor of multiple sites without organ or systems involvement: Secondary | ICD-10-CM | POA: Diagnosis not present

## 2020-06-16 DIAGNOSIS — G2581 Restless legs syndrome: Secondary | ICD-10-CM | POA: Diagnosis not present

## 2020-06-16 DIAGNOSIS — J849 Interstitial pulmonary disease, unspecified: Secondary | ICD-10-CM | POA: Diagnosis not present

## 2020-06-16 DIAGNOSIS — N390 Urinary tract infection, site not specified: Secondary | ICD-10-CM

## 2020-06-16 DIAGNOSIS — U071 COVID-19: Secondary | ICD-10-CM

## 2020-06-16 DIAGNOSIS — R531 Weakness: Secondary | ICD-10-CM

## 2020-06-16 DIAGNOSIS — J9611 Chronic respiratory failure with hypoxia: Secondary | ICD-10-CM

## 2020-06-16 NOTE — Progress Notes (Signed)
Location:  [place] Room Number: 308A Place of Service:  SNF (31) Provider:  Durenda Age, DNP, FNP-BC  Patient Care Team: Lajean Manes, MD as PCP - General (Internal Medicine)  Extended Emergency Contact Information Primary Emergency Contact: Susanne Borders of [place] Phone: [phone] Mobile Phone: [phone] Relation: Relative Secondary Emergency Contact: [place] of [place] Phone: [phone] Mobile Phone: [phone] Relation: Son  Code Status:   Full Code  Goals of care: Advanced Directive information Advanced Directives 06/11/2020  Does Patient Have a Medical Advance Directive? No  Type of Advance Directive -  Does patient want to make changes to medical advance directive? -  Copy of [place] in Chart? -  Would patient like information on creating a medical advance directive? No - Patient declined     Chief Complaint  Patient presents with  . Acute Visit    Hospital follow up    HPI:   Pt is a 80 y.o. [name] who was admitted to [facility] on  06/15/20 post [facility], 06/11/20 to 06/15/20. She has a PMH of rheumatoid arthritis, ILD, COPD, chronic respiratory failure on 2 L, hypertension, GERD, hyperlipidemia and OSA on CPAP. She presented to the hospital with over a week of nonproductive cough with decreased oral intake 1 week prior to hospital admission.  She stated that she had a positive home COVID-19 test on 06/01/2020.  Since then, her symptoms progressed with nausea and decreased oral intake and loose stools.  She had one episode of emesis on the day of admission.  Her generalized weakness has progressed to the point where she was having difficulty getting off the commode on the day of admission so EMS was activated.  She has chronic shortness of breath which has not been any worse.  In the ED, O2 sat was 85% on 2 L so oxygen was  increased to 3 L.  Labs showed sodium 137, K3.3, serum creatinine 1.12, hemoglobin 11.5, WBC 10.8 and platelets 124,000.  She was vaccinated x3 with COVID-19 vaccine.  She was given 3 days of Remdesivir. She was, also, treated for E. coli UTI and was started on Cipro then shifted to cefdinir.  Her home prednisone dose was temporary doubled and now being tapered.  She was seen in her room today.  She was able to walk from the bathroom to her bed. She has O2 @ 2L/min via [place]. Son and daughter-in-law came to visit.  Past Medical History:  Diagnosis Date  . Allergic rhinitis   . Asthma   . Asthmatic bronchitis   . Colon polyps   . COPD (chronic obstructive pulmonary disease) ([place])   . Diverticulosis   . GERD (gastroesophageal reflux disease)   . H/O: GI bleed   . Hypercholesterolemia   . Migraine headache   . Obesity   . RLS (restless legs syndrome)    Past Surgical History:  Procedure Laterality Date  . TONSILLECTOMY  x2  . TOTAL ABDOMINAL HYSTERECTOMY  02/17/1989    Allergies  Allergen Reactions  . Shellfish Allergy Anaphylaxis    With vomiting and diarrhea  . Atorvastatin Other (See Comments)    Leg muscle pain  . Sertraline Hcl     Unknown reaction  . Macrolides And Ketolides Rash  . Penicillins Other (See Comments)    Facial numbness  . Sulfa Antibiotics Other (See Comments)    Facial numbness    Outpatient Encounter Medications as  of 06/16/2020  Medication Sig  . acetaminophen (TYLENOL) 500 MG tablet Take 1,000 mg by mouth every 6 (six) hours as needed for moderate pain.  Marland Kitchen alendronate (FOSAMAX) 70 MG tablet Take 70 mg by mouth once a week.  . Ascorbic Acid (VITAMIN C) 1000 MG tablet Take 1,000 mg by mouth daily.  Marland Kitchen aspirin 81 MG tablet Take 81 mg by mouth every other day.  . bisacodyl (DULCOLAX) 10 MG suppository Place 10 mg rectally as needed for moderate constipation.  . Calcium Carbonate-Vitamin D 600-200 MG-UNIT TABS Take 1 tablet by mouth daily.  . [EXPIRED]  cefdinir (OMNICEF) 300 MG capsule Take 1 capsule (300 mg total) by mouth every 12 (twelve) hours for 3 days.  . Fluticasone-Umeclidin-Vilant (TRELEGY ELLIPTA) 200-62.5-Rachel MCG/INH AEPB Inhale 1 puff into the lungs daily.  Marland Kitchen guaiFENesin-dextromethorphan (ROBITUSSIN DM) 100-10 MG/5ML syrup Take 10 mLs by mouth every 4 (four) hours as needed for cough.  . hydroxychloroquine (PLAQUENIL) 200 MG tablet Take 200 mg by mouth 2 (two) times daily.  Marland Kitchen leflunomide (ARAVA) 20 MG tablet Take 20 mg by mouth every morning.  . levalbuterol (XOPENEX) 1.Rachel MG/3ML nebulizer solution Take 1.Rachel mg by nebulization 3 (three) times daily.  Marland Kitchen lisinopril (PRINIVIL,ZESTRIL) 5 MG tablet Take 5 mg by mouth daily.   Marland Kitchen loratadine (CLARITIN) 10 MG tablet Take 10 mg by mouth daily.  . magnesium hydroxide (MILK OF MAGNESIA) 400 MG/5ML suspension Take by mouth daily as needed for mild constipation.  Marland Kitchen omeprazole (PRILOSEC) 40 MG capsule Take 1 capsule by mouth daily.  . OXYGEN Inhale into the lungs. 2 lpm with rest and exertion  . predniSONE (DELTASONE) 20 MG tablet 2 tablet by mouth daily x2 days; then 1 tablet by mouth daily x3 days; then half tablet by mouth daily x3 days and then resume chronic 7.5 mg prednisone daily.  . predniSONE (DELTASONE) 5 MG tablet Take 1.5 tablets (7.5 mg total) by mouth daily with breakfast. To be resume after completing tapering steroids.  Marland Kitchen rOPINIRole (REQUIP) 0.Rachel MG tablet Take 0.Rachel mg by mouth 3 (three) times daily.  . rosuvastatin (CRESTOR) 5 MG tablet Take 5 mg by mouth every morning.  . traMADol (ULTRAM) 50 MG tablet Take 1-2 tablets (50-100 mg total) by mouth every 8 (eight) hours as needed for moderate pain. Take 1/2 to 1 tablet TID as needed with Extra Strength Tylenol  . zinc sulfate 220 (50 Zn) MG capsule Take 1 capsule (220 mg total) by mouth daily.   No facility-administered encounter medications on file as of 06/16/2020.    Review of Systems  GENERAL: No change in appetite, no fatigue,  no weight changes, no fever, chills or weakness MOUTH and THROAT: Denies oral discomfort, gingival pain or bleeding RESPIRATORY: no cough, SOB, DOE, wheezing, hemoptysis CARDIAC: No chest pain, edema or palpitations GI: No abdominal pain, diarrhea, constipation, heart burn, nausea or vomiting GU: Denies dysuria, frequency, hematuria, incontinence, or discharge NEUROLOGICAL: Denies dizziness, syncope, numbness, or headache PSYCHIATRIC: Denies feelings of depression or anxiety. No report of hallucinations, insomnia, paranoia, or agitation    Immunization History  Administered Date(s) Administered  . DT (Pediatric) 09/15/2003  . Influenza Split 01/01/2009, 02/17/2010, 02/02/2013, 12/30/2014, 01/14/2017, 09/Rachel/2021  . Influenza, High Dose Seasonal PF 01/10/2014, 01/17/2016, 01/14/2017, 12/25/2017, 01/16/2018, 01/11/2019, 09/Rachel/2021  . Influenza,inj,Quad PF,6+ Mos 12/30/2010, 01/13/2012  . Influenza-Unspecified 12/30/2014, 02/02/2019  . Moderna Sars-Covid-2 Vaccination 05/06/2019, 06/11/2019, 07/04/2019  . Pneumococcal Conjugate-13 03/Rachel/2015  . Pneumococcal Polysaccharide-23 09/02/2000, 03/03/2009, 07/31/2016  . Tdap 11/02/2010  .  Zoster 05/31/2011, 09/13/2016, 12/14/2016  . Zoster Recombinat (Shingrix) 12/14/2016   Pertinent  Health Maintenance Due  Topic Date Due  . INFLUENZA VACCINE  Completed  . DEXA SCAN  Completed  . PNA vac Low Risk Adult  Completed   No flowsheet data found.   Vitals:   06/16/20 1116  BP: 139/78  Pulse: 93  Resp: 20  Temp: (!) 96.9 F (36.1 C)  Weight: 288 lb (130.6 kg)  Height: 5\' 7"  (1.702 m)   Body mass index is 45.11 kg/m.  Physical Exam  GENERAL APPEARANCE: Well nourished. In no acute distress. Morbidly obese SKIN:  Skin is warm and dry.  MOUTH and THROAT: Lips are without lesions. Oral mucosa is moist and without lesions. Tongue is normal in shape, size, and color and without lesions RESPIRATORY: Breathing is even & unlabored, BS  CTAB CARDIAC: RRR, no murmur,no extra heart sounds, no edema GI: Abdomen soft, normal BS, no masses, no tenderness EXTREMITIES:  Able to move X 4 extremities NEUROLOGICAL: There is no tremor. Speech is clear. PSYCHIATRIC:  Affect and behavior are appropriate  Labs reviewed: Recent Labs    06/13/20 0634 06/13/20 1410 06/14/20 0455 06/15/20 0451  NA 142 142 140 140  K 5.4* 4.6 4.5 4.4  CL 108 107 106 104  CO2 Rachel 27 Rachel 27   GLUCOSE 142* 142* 152* 124*  BUN 16 15 17 22   CREATININE 0.84 0.86 0.85 0.86  CALCIUM 8.2* 8.2* 7.8* 7.8*  MG 1.5*  --   --   --    Recent Labs    06/13/20 0634 06/14/20 0455 06/15/20 0451  AST Rachel 26 27   ALT 16 18 20   ALKPHOS 55 57 55  BILITOT 0.5 0.5 0.5  PROT 5.6* 5.9* 6.0*  ALBUMIN 2.7* 2.7* 2.9*   Recent Labs    06/13/20 0634 06/14/20 0455 06/15/20 0451  WBC 8.7 11.4* 12.3*  NEUTROABS 6.0 9.3* 8.9*  HGB 10.1* 10.4* 10.6*  HCT 31.8* 33.5* 33.9*  MCV 100.3* 100.0 100.3*  PLT 126* 161 184   Lab Results  Component Value Date   TSH 1.638 06/12/2020    Significant Diagnostic Results in last 30 days:  CT Head Wo Contrast  Result Date: 06/11/2020 CLINICAL DATA:  80 year old [name] with weakness. Unable to get off of the toilet this morning. Suspected cerebral hemorrhage. EXAM: CT HEAD WITHOUT CONTRAST TECHNIQUE: Contiguous axial images were obtained from the base of the skull through the vertex without intravenous contrast. COMPARISON:  No priors. FINDINGS: Brain: Patchy and confluent areas of decreased attenuation are noted throughout the deep and periventricular white matter of the cerebral hemispheres bilaterally, compatible with chronic microvascular ischemic disease. More well-defined focus of low attenuation in the superior aspect of the left basal ganglia, compatible with an old lacunar infarct. No evidence of acute infarction, hemorrhage, hydrocephalus, extra-axial collection or mass lesion/mass effect. Vascular: No hyperdense vessel or  unexpected calcification. Skull: Normal. Negative for fracture or focal lesion. Sinuses/Orbits: No acute finding. Other: None. IMPRESSION: 1. No acute intracranial abnormalities. Specifically, no evidence of acute intracranial hemorrhage. 2. Chronic microvascular ischemic changes in the cerebral white matter, and old left basal ganglia lacunar infarct, as above. Electronically Signed   By: Vinnie Langton M.D.   On: 06/11/2020 10:23   CT Lumbar Spine Wo Contrast  Result Date: 06/11/2020 CLINICAL DATA:  80 year old [name] with weakness. EXAM: CT LUMBAR SPINE WITHOUT CONTRAST TECHNIQUE: Multidetector CT imaging of the lumbar spine was performed without intravenous contrast administration. Multiplanar CT image  reconstructions were also generated. COMPARISON:  Lumbar MRI 03/05/2014.  Chest CT 11/05/2019. FINDINGS: Segmentation: Normal. Alignment: Unchanged lumbar lordosis since 2015. No spondylolisthesis. Vertebrae: Generalized osteopenia. 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 Visible lower thoracic levels and ribs appear intact. Stable vertebral height since 2015. Visible sacrum and SI joints appear intact. No acute osseous abnormality identified. Paraspinal and other soft tissues: Negative visible lung bases. Aortoiliac calcified atherosclerosis. Vascular patency is not evaluated in the absence of IV contrast. Mildly tortuous infrarenal abdominal aorta. Negative visible other noncontrast abdominal viscera. Negative lumbar paraspinal soft tissues. Disc levels: Chronic lower lumbar spinal stenosis in part due to epidural lipomatosis appears stable since 2015. Multifactorial spinal stenosis at L4-L5 is mild-to-moderate related to disc bulging and posterior element hypertrophy. Right side vacuum facet at that level. No significant spinal stenosis above L4. Vacuum disc at L1-L2 and L2-L3 is chronic but progressed. IMPRESSION: 1. Osteopenia.  No acute  osseous abnormality. 2. Chronic lower lumbar spinal stenosis, in part due to epidural lipomatosis and mild to moderate at L4-L5, appears stable since a 2015 MRI. 3.  Aortic Atherosclerosis (ICD10-I70.0). Electronically Signed   By: Genevie Rocio M.D.   On: 06/11/2020 10:27   DG Chest Port 1 View  Result Date: 06/11/2020 CLINICAL DATA:  COVID-19 positive four days ago with worsening shortness of breath this morning. EXAM: PORTABLE CHEST 1 VIEW COMPARISON:  11/24/2017 FINDINGS: Lungs are adequately inflated without focal airspace consolidation or effusion. Mild chronic diffuse prominence of the bronchovascular markings. Mild stable cardiomegaly. Small rectangular metallic density projects over the left base of uncertain clinical significance. Remainder of the exam is unchanged. IMPRESSION: 1. No acute cardiopulmonary disease. 2. Mild stable cardiomegaly. Mild chronic diffuse interstitial changes. 3. Small metallic foreign body projecting over the left base. Recommend clinical correlation. Electronically Signed   By: Marin Olp M.D.   On: 06/11/2020 15:42    Assessment/Plan  1. COVID-19 -    Was given 3 days of remdesivir  2. Acute lower UTI -Was started on Cipro, then shifted to cefdinir  3. ILD (interstitial lung disease) (HCC) -   Prednisone home dose was doubled now tapering -   Continue trilegy Ellipta and he levalbuterol nebulization to Xopenex 45 mcg/ACT 1 puff into the lungs TID -    Follow-up with Dr. Loanne Drilling  4. Chronic respiratory failure with hypoxia (HCC) -   Continue O2 at 2 L/minute via [place]  5. Rheumatoid arthritis involving multiple sites with positive rheumatoid factor (HCC) -    Continue leflunomide and hydroxychloroquine  6. RLS (restless legs syndrome) -    Continue ropinirole  7. Generalized weakness -   For PT and OT, for therapeutic strengthening exercises -   Fall precautions     Family/ staff Communication: Discussed plan of care with resident and charge  nurse.  Labs/tests ordered: BMP  Goals of care:   Short-term care   Durenda Age, DNP, MSN, FNP-BC [facility] [phone] (Monday-Friday 8:00 a.m. - 5:00 p.m.) [phone] (after hours)

## 2020-06-16 NOTE — TOC Transition Note (Signed)
Late for 06/15/20 Transition of Care [facility]) - CM/SW Discharge Note   Patient Details  Name: [name] MRN: [id number] Date of Birth: 1941-02-25  Transition of Care [facility]) CM/SW Contact:  Ihor Gully, LCSW Phone Number: 06/16/2020, 9:34 AM   Clinical Narrative:    Discharge clinicals sent to facility. RN to call report. TOC signing off.    Final next level of care: [place]     Patient Goals and CMS Choice        Discharge Placement              Patient chooses bed at: [facility] Patient to be transferred to facility by: [facility] Name of family member notified: son    Discharge Plan and Services                                     Social Determinants of Health (SDOH) Interventions     Readmission Risk Interventions No flowsheet data found.

## 2020-06-19 DIAGNOSIS — R0989 Other specified symptoms and signs involving the circulatory and respiratory systems: Secondary | ICD-10-CM | POA: Diagnosis not present

## 2020-06-19 DIAGNOSIS — Z111 Encounter for screening for respiratory tuberculosis: Secondary | ICD-10-CM | POA: Diagnosis not present

## 2020-06-20 ENCOUNTER — Encounter: Payer: Self-pay | Admitting: Internal Medicine

## 2020-06-20 ENCOUNTER — Non-Acute Institutional Stay (SKILLED_NURSING_FACILITY): Payer: PPO | Admitting: Internal Medicine

## 2020-06-20 DIAGNOSIS — U071 COVID-19: Secondary | ICD-10-CM | POA: Diagnosis not present

## 2020-06-20 DIAGNOSIS — Z9289 Personal history of other medical treatment: Secondary | ICD-10-CM

## 2020-06-20 DIAGNOSIS — J9611 Chronic respiratory failure with hypoxia: Secondary | ICD-10-CM | POA: Diagnosis not present

## 2020-06-20 DIAGNOSIS — I10 Essential (primary) hypertension: Secondary | ICD-10-CM | POA: Insufficient documentation

## 2020-06-20 DIAGNOSIS — N390 Urinary tract infection, site not specified: Secondary | ICD-10-CM

## 2020-06-20 NOTE — Assessment & Plan Note (Signed)
She has completed the course of antibiotics and has no active GU symptoms.

## 2020-06-20 NOTE — Patient Instructions (Signed)
See assessment and plan under each diagnosis in the problem list and acutely for this visit 

## 2020-06-20 NOTE — Assessment & Plan Note (Signed)
Current O2 sats are 96% on her maintenance oxygen.  She exhibits no respiratory distress but musical expiratory rhonchi are present.  Present pulmonary toilet and steroid wean will be continued.

## 2020-06-20 NOTE — Assessment & Plan Note (Signed)
Today's blood pressure is an outlier.  No changes will be made unless this is a consistent finding.

## 2020-06-20 NOTE — Progress Notes (Unsigned)
NURSING HOME LOCATION:  [facility] ROOM NUMBER: 308/A   CODE STATUS:  Full Code  PCP:  Lajean Manes, MD  This is a comprehensive admission note to [facility] performed on this date less than 30 days from date of admission. Included are preadmission medical/surgical history; reconciled medication list; family history; social history and comprehensive review of systems.  Corrections and additions to the records were documented. Comprehensive physical exam was also performed. Additionally a clinical summary was entered for each active diagnosis pertinent to this admission in the Problem List to enhance continuity of care.  HPI: Patient was hospitalized 2/27-06/15/2020 with acute Covid infection with associated acute hypoxic respiratory failure superimposed on chronic respiratory failure due to oxygen dependent ILD. The week prior to admission she had progressive generalized weakness and nonproductive cough with decreased oral intake. Home Covid testing was +2/17.  The patient had had the Moderna vaccine x2 in addition to the booster.  Symptoms progressed associated with nausea and loose stools.  The day of admission she had 1 episode of emesis.  Weakness was so profound she was unable to rise from the toilet that day prompting contacting EMS. In the ED despite 2 L nasal O2 O2 sats were 85%.  O2 was increased to 3 L. Patient received 3 days of remdesivir plus steroid burst with subsequent wean to baseline, maintenance therapy. Hospital course was complicated by E. coli UTI.  Initially she received Cipro which was transitioned to cefdinir which was to be continued 3 days post discharge. Additionally she had transient isolated atrial fibrillation. Hypokalemia and hypomagnesemia were repleted. Because of debilitation; she was discharged to SNF for PT/OT.  Past medical and surgical history: Includes OSA on CPAP, COPD, restless leg syndrome, history of migraines, dyslipidemia, GERD,  diverticulosis, and history of colonic polyps. Surgeries and procedures include TAH.  Social history: Nondrinker; former 46-pack-year smoking history. Former Art therapist.  Family history: Family history is noncontributory due to advanced age.   Review of systems: She is alert and oriented and gives an excellent history.  She is anxious to go home to take care of her toy poodle pet.  She continues to have some weakness after standing for extended period of time.  She also has a cough with clear phlegm.  Typically cough is not an issue.  She is also had bruising related to the heparin injections.  This is resolving.   She describes some dysphagia symptoms with water but not with pills, food or ice. She has chronic numbness and tingling in her feet.  She has no active COVID or GU symptoms beyond incontinence & frequency.  Constitutional: No fever, significant weight change Eyes: No redness, discharge, pain, vision change ENT/mouth: No nasal congestion, purulent discharge, earache, change in hearing, sore throat  Cardiovascular: No chest pain, palpitations, paroxysmal nocturnal dyspnea, claudication, edema  Respiratory: No hemoptysis, significant snoring Gastrointestinal: No heartburn,  abdominal pain, nausea /vomiting, rectal bleeding, melena, change in bowels Genitourinary: No dysuria, hematuria, pyuria,nocturia Musculoskeletal: No joint stiffness, joint swelling,  pain Dermatologic: No rash, pruritus, change in appearance of skin Neurologic: No dizziness, headache, syncope, seizures Psychiatric: No significant anxiety, depression, insomnia, anorexia Endocrine: No change in hair/skin/nails, excessive thirst, excessive hunger  Hematologic/lymphatic: No significant lymphadenopathy, abnormal bleeding Allergy/immunology: No itchy/watery eyes, significant sneezing, urticaria, angioedema  Physical exam:  Pertinent or positive findings: Central weight excess is present.  She is slightly hard of  hearing.  There is slight ptosis of the right eye.  She has an  upper plate and lower partial.  Heart is clinically slightly irregular and heart sounds are distant.  Breath sounds are decreased except for expiratory musical rhonchi in the lower lung fields.  Abdomen is protuberant.  Pedal pulses are decreased.  She has scattered bruising over the forearms.  She also has resolving ecchymoses over the abdomen at the site of heparin injections. Trace edema @ ankles. Upper extremities appear clinically somewhat weaker than the lower extremities.  She does not have striking rheumatoid arthritis changes in the hands.  General appearance: Adequately nourished; no acute distress, increased work of breathing is present.   Lymphatic: No lymphadenopathy about the head, neck, axilla. Eyes: No conjunctival inflammation or lid edema is present. There is no scleral icterus. Ears:  External ear exam shows no significant lesions or deformities.   Nose:  External nasal examination shows no deformity or inflammation. Nasal mucosa are pink and moist without lesions, exudates Oral exam: Lips and gums are healthy appearing.There is no oropharyngeal erythema or exudate. Neck:  No thyromegaly, masses, tenderness noted.    Heart:  No gallop, murmur, click, rub.  Lungs:  without wheezes, rales, rubs. Abdomen: Bowel sounds are normal.  Abdomen is soft and nontender with no organomegaly, hernias, masses. GU: Deferred  Extremities:  No cyanosis, clubbing. Neurologic exam: Balance, Rhomberg, finger to nose testing could not be completed due to clinical state Skin: Warm & dry w/o tenting. No significant rash.  See clinical summary under each active problem in the Problem List with associated updated therapeutic plan

## 2020-06-20 NOTE — Assessment & Plan Note (Signed)
She has no active COVID symptoms except for cough with some clear phlegm.  She does not typically have this at baseline and does not describe any cough related to her ACE inhibitor therapy.

## 2020-06-22 ENCOUNTER — Encounter: Payer: Self-pay | Admitting: Adult Health

## 2020-06-22 ENCOUNTER — Non-Acute Institutional Stay (SKILLED_NURSING_FACILITY): Payer: PPO | Admitting: Adult Health

## 2020-06-22 DIAGNOSIS — J439 Emphysema, unspecified: Secondary | ICD-10-CM

## 2020-06-22 DIAGNOSIS — I10 Essential (primary) hypertension: Secondary | ICD-10-CM

## 2020-06-22 DIAGNOSIS — U071 COVID-19: Secondary | ICD-10-CM | POA: Diagnosis not present

## 2020-06-22 DIAGNOSIS — J9611 Chronic respiratory failure with hypoxia: Secondary | ICD-10-CM

## 2020-06-22 DIAGNOSIS — J849 Interstitial pulmonary disease, unspecified: Secondary | ICD-10-CM

## 2020-06-22 DIAGNOSIS — G2581 Restless legs syndrome: Secondary | ICD-10-CM

## 2020-06-22 DIAGNOSIS — R0600 Dyspnea, unspecified: Secondary | ICD-10-CM

## 2020-06-22 DIAGNOSIS — R531 Weakness: Secondary | ICD-10-CM | POA: Diagnosis not present

## 2020-06-22 DIAGNOSIS — M0579 Rheumatoid arthritis with rheumatoid factor of multiple sites without organ or systems involvement: Secondary | ICD-10-CM | POA: Diagnosis not present

## 2020-06-22 NOTE — Progress Notes (Signed)
Location:  [facility] Room Number: 308A Place of Service:  SNF (31) Provider:  Durenda Age, DNP, FNP-BC  Patient Care Team: Lajean Manes, MD as PCP - General (Internal Medicine)  Extended Emergency Contact Information Primary Emergency Contact: Susanne [place] of [place] Phone: [phone] Mobile Phone: [phone] Relation: Relative Secondary Emergency Contact: [place] Phone: [phone] Mobile Phone: [phone] Relation: Son  Code Status:   Full Code  Goals of care: Advanced Directive information Advanced Directives 06/22/2020  Does Patient Have a Medical Advance Directive? No  Type of Advance Directive -  Does patient want to make changes to medical advance directive? -  Copy of [place] in Chart? -  Would patient like information on creating a medical advance directive? -     Chief Complaint  Patient presents with  . Discharge Note    For discharge home on 06/23/20    HPI:  Pt is a 80 y.o. female who is for discharge home on 06/23/20 with Home health PT and OT.  She was admitted to [facility] on 06/15/20 post [facility] admission 06/11/20 to 06/15/20.  She has a PMH of rheumatoid arthritis, ILD, COPD, chronic respiratory failure on 2 L O2, hypertension, GERD, hyperlipidemia and OSA on CPAP.  She presented to the hospital with over a week of nonproductive cough with decreased oral intake 1 week prior to hospital admission.  She stated that she had a positive home COVID-19 test on 06/01/2020.  Since then, her symptoms progressed with nausea and decreased oral intake and loose stools.  She had one episode of emesis on the day of admission.  Her generalized weaknes progressed to the point where she was having difficulty getting off the commode on the day of admission so EMS was activated.  She has chronic shortness of breath which has not  been any worse.  In the ED, O2 sat was 85% on 2 L so oxygen was increased to 3 L.  Labs showed sodium 137, K3.3, serum creatinine 1.12, hemoglobin 11.5, WBC 10.8 and platelets 124,000.  She was vaccinated x3 with COVID-19 vaccine.  She was given 3 days of remdesivir.  She was also treated for E. coli UTI and was started on Cipro then shifted to cefdinir.  Her home prednisone dose was temporarily doubled and now being tapered.  Patient was admitted to this facility for short-term rehabilitation after the patient's recent hospitalization.  Patient has completed SNF rehabilitation and therapy has cleared the patient for discharge.    Past Medical History:  Diagnosis Date  . Allergic rhinitis   . Asthma   . Asthmatic bronchitis   . Colon polyps   . COPD (chronic obstructive pulmonary disease) ([place])   . Diverticulosis   . GERD (gastroesophageal reflux disease)   . H/O: GI bleed   . Hypercholesterolemia   . Migraine headache   . Obesity   . RLS (restless legs syndrome)    Past Surgical History:  Procedure Laterality Date  . TONSILLECTOMY  x2  . TOTAL ABDOMINAL HYSTERECTOMY  02/17/1989    Allergies  Allergen Reactions  . Shellfish Allergy Anaphylaxis    With vomiting and diarrhea  . Atorvastatin Other (See Comments)    Leg muscle pain  . Sertraline Hcl     Unknown reaction  . Macrolides And Ketolides Rash  . Penicillins Other (See Comments)    Facial numbness  . Sulfa Antibiotics Other (See  Comments)    Facial numbness    Outpatient Encounter Medications as of 06/22/2020  Medication Sig  . acetaminophen (TYLENOL) 500 MG tablet Take 1,000 mg by mouth every 6 (six) hours as needed for moderate pain.  Marland Kitchen alendronate (FOSAMAX) 70 MG tablet Take 70 mg by mouth once a week.  . Ascorbic Acid (VITAMIN C) 1000 MG tablet Take 1,000 mg by mouth daily.  Marland Kitchen aspirin 81 MG tablet Take 81 mg by mouth every other day.  . bisacodyl (DULCOLAX) 10 MG suppository Place 10 mg rectally as needed for  moderate constipation.  . Calcium Carbonate-Vitamin D 600-200 MG-UNIT TABS Take 1 tablet by mouth daily.  . Fluticasone-Umeclidin-Vilant (TRELEGY ELLIPTA) 200-62.5-25 MCG/INH AEPB Inhale 1 puff into the lungs daily.  Marland Kitchen guaiFENesin-dextromethorphan (ROBITUSSIN DM) 100-10 MG/5ML syrup Take 10 mLs by mouth every 4 (four) hours as needed for cough.  . hydroxychloroquine (PLAQUENIL) 200 MG tablet Take 200 mg by mouth 2 (two) times daily.  Marland Kitchen leflunomide (ARAVA) 20 MG tablet Take 20 mg by mouth every morning.  . levalbuterol (XOPENEX HFA) 45 MCG/ACT inhaler Inhale 1 puff into the lungs 3 (three) times daily.  Marland Kitchen lisinopril (PRINIVIL,ZESTRIL) 5 MG tablet Take 5 mg by mouth daily.   Marland Kitchen loratadine (CLARITIN) 10 MG tablet Take 10 mg by mouth daily.  . magnesium hydroxide (MILK OF MAGNESIA) 400 MG/5ML suspension Take by mouth daily as needed for mild constipation.  Marland Kitchen omeprazole (PRILOSEC) 40 MG capsule Take 1 capsule by mouth daily.  . OXYGEN Inhale into the lungs. 2 lpm with rest and exertion  . predniSONE (DELTASONE) 10 MG tablet Take 10 mg by mouth daily with breakfast. For 3 days for COPD  . [START ON 06/27/2020] predniSONE (DELTASONE) 5 MG tablet Take 7.5 mg by mouth daily with breakfast. For COPD  . rOPINIRole (REQUIP) 0.25 MG tablet Take 0.25 mg by mouth 3 (three) times daily.  . rosuvastatin (CRESTOR) 5 MG tablet Take 5 mg by mouth every morning.  . Sodium Phosphates (RA SALINE ENEMA RE) Place rectally as needed.  . traMADol (ULTRAM) 50 MG tablet Take 50 mg by mouth every 8 (eight) hours as needed for moderate pain.  Marland Kitchen zinc sulfate 220 (50 Zn) MG capsule Take 1 capsule (220 mg total) by mouth daily.  . [DISCONTINUED] predniSONE (DELTASONE) 20 MG tablet Take 20 mg by mouth daily with breakfast. For 3 days for COPD   No facility-administered encounter medications on file as of 06/22/2020.    Review of Systems  GENERAL: No change in appetite, no fatigue, no weight changes, no fever, chills or  weakness MOUTH and THROAT: Denies oral discomfort, gingival pain or bleeding, pain from teeth or hoarseness   RESPIRATORY: no cough, SOB, DOE, wheezing, hemoptysis CARDIAC: No chest pain, edema or palpitations GI: No abdominal pain, diarrhea, constipation, heart burn, nausea or vomiting GU: Denies dysuria, frequency, hematuria, incontinence, or discharge NEUROLOGICAL: Denies dizziness, syncope, numbness, or headache PSYCHIATRIC: Denies feelings of depression or anxiety. No report of hallucinations, insomnia, paranoia, or agitation    Immunization History  Administered Date(s) Administered  . DT (Pediatric) 09/15/2003  . Influenza Split 01/01/2009, 02/17/2010, 02/02/2013, 12/30/2014, 01/14/2017, 01/08/2020  . Influenza, High Dose Seasonal PF 01/10/2014, 01/17/2016, 01/14/2017, 12/25/2017, 01/16/2018, 01/11/2019, 01/08/2020  . Influenza,inj,Quad PF,6+ Mos 12/30/2010, 01/13/2012  . Influenza-Unspecified 12/30/2014, 02/02/2019  . Moderna Sars-Covid-2 Vaccination 05/06/2019, 06/11/2019, 07/04/2019  . Pneumococcal Conjugate-13 07/07/2013  . Pneumococcal Polysaccharide-23 09/02/2000, 03/03/2009, 07/31/2016  . Tdap 11/02/2010  . Zoster 05/31/2011, 09/13/2016, 12/14/2016  .  Zoster Recombinat (Shingrix) 12/14/2016   Pertinent  Health Maintenance Due  Topic Date Due  . INFLUENZA VACCINE  Completed  . DEXA SCAN  Completed  . PNA vac Low Risk Adult  Completed   No flowsheet data found.   Vitals:   06/23/20 0128  BP: (!) 143/69  Pulse: 77  Resp: 20  Temp: (!) 97.3 F (36.3 C)  Weight: 273 lb (123.8 kg)  Height: 5\' 7"  (1.702 m)   Body mass index is 42.76 kg/m.  Physical Exam  GENERAL APPEARANCE: Well nourished. In no acute distress. Morbidly obese SKIN:  Skin is warm and dry.  MOUTH and THROAT: Lips are without lesions. Oral mucosa is moist and without lesions. Tongue is normal in shape, size, and color and without lesions RESPIRATORY: Breathing is even & unlabored, BS  CTAB CARDIAC: RRR, no murmur,no extra heart sounds, no edema GI: Abdomen soft, normal BS, no masses, no tenderness EXTREMITIES:  Able to move X 4 extremities NEUROLOGICAL: There is no tremor. Speech is clear. Alert and oriented X 3. PSYCHIATRIC:  Affect and behavior are appropriate  Labs reviewed: Recent Labs    06/13/20 0634 06/13/20 1410 06/14/20 0455 06/15/20 0451  NA 142 142 140 140  K 5.4* 4.6 4.5 4.4  CL 108 107 106 104  CO2 25 27 25 27   GLUCOSE 142* 142* 152* 124*  BUN 16 15 17 22   CREATININE 0.84 0.86 0.85 0.86  CALCIUM 8.2* 8.2* 7.8* 7.8*  MG 1.5*  --   --   --    Recent Labs    06/13/20 0634 06/14/20 0455 06/15/20 0451  AST 25 26 27   ALT 16 18 20   ALKPHOS 55 57 55  BILITOT 0.5 0.5 0.5  PROT 5.6* 5.9* 6.0*  ALBUMIN 2.7* 2.7* 2.9*   Recent Labs    06/13/20 0634 06/14/20 0455 06/15/20 0451  WBC 8.7 11.4* 12.3*  NEUTROABS 6.0 9.3* 8.9*  HGB 10.1* 10.4* 10.6*  HCT 31.8* 33.5* 33.9*  MCV 100.3* 100.0 100.3*  PLT 126* 161 184   Lab Results  Component Value Date   TSH 1.638 06/12/2020    Significant Diagnostic Results in last 30 days:  CT Head Wo Contrast  Result Date: 06/11/2020 CLINICAL DATA:  80 year old female with weakness. Unable to get off of the toilet this morning. Suspected cerebral hemorrhage. EXAM: CT HEAD WITHOUT CONTRAST TECHNIQUE: Contiguous axial images were obtained from the base of the skull through the vertex without intravenous contrast. COMPARISON:  No priors. FINDINGS: Brain: Patchy and confluent areas of decreased attenuation are noted throughout the deep and periventricular white matter of the cerebral hemispheres bilaterally, compatible with chronic microvascular ischemic disease. More well-defined focus of low attenuation in the superior aspect of the left basal ganglia, compatible with an old lacunar infarct. No evidence of acute infarction, hemorrhage, hydrocephalus, extra-axial collection or mass lesion/mass effect. Vascular: No  hyperdense vessel or unexpected calcification. Skull: Normal. Negative for fracture or focal lesion. Sinuses/Orbits: No acute finding. Other: None. IMPRESSION: 1. No acute intracranial abnormalities. Specifically, no evidence of acute intracranial hemorrhage. 2. Chronic microvascular ischemic changes in the cerebral white matter, and old left basal ganglia lacunar infarct, as above. Electronically Signed   By: Vinnie Langton M.D.   On: 06/11/2020 10:23   CT Lumbar Spine Wo Contrast  Result Date: 06/11/2020 CLINICAL DATA:  80 year old female with weakness. EXAM: CT LUMBAR SPINE WITHOUT CONTRAST TECHNIQUE: Multidetector CT imaging of the lumbar spine was performed without intravenous contrast administration. Multiplanar CT image  reconstructions were also generated. COMPARISON:  Lumbar MRI 03/05/2014.  Chest CT 11/05/2019. FINDINGS: Segmentation: Normal. Alignment: Unchanged lumbar lordosis since 2015. No spondylolisthesis. Vertebrae: Generalized osteopenia. 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 Visible lower thoracic levels and ribs appear intact. Stable vertebral height since 2015. Visible sacrum and SI joints appear intact. No acute osseous abnormality identified. Paraspinal and other soft tissues: Negative visible lung bases. Aortoiliac calcified atherosclerosis. Vascular patency is not evaluated in the absence of IV contrast. Mildly tortuous infrarenal abdominal aorta. Negative visible other noncontrast abdominal viscera. Negative lumbar paraspinal soft tissues. Disc levels: Chronic lower lumbar spinal stenosis in part due to epidural lipomatosis appears stable since 2015. Multifactorial spinal stenosis at L4-L5 is mild-to-moderate related to disc bulging and posterior element hypertrophy. Right side vacuum facet at that level. No significant spinal stenosis above L4. Vacuum disc at L1-L2 and L2-L3 is chronic but progressed. IMPRESSION: 1.  Osteopenia.  No acute osseous abnormality. 2. Chronic lower lumbar spinal stenosis, in part due to epidural lipomatosis and mild to moderate at L4-L5, appears stable since a 2015 MRI. 3.  Aortic Atherosclerosis (ICD10-I70.0). Electronically Signed   By: Genevie Reannah M.D.   On: 06/11/2020 10:27   DG Chest Port 1 View  Result Date: 06/11/2020 CLINICAL DATA:  COVID-19 positive four days ago with worsening shortness of breath this morning. EXAM: PORTABLE CHEST 1 VIEW COMPARISON:  11/24/2017 FINDINGS: Lungs are adequately inflated without focal airspace consolidation or effusion. Mild chronic diffuse prominence of the bronchovascular markings. Mild stable cardiomegaly. Small rectangular metallic density projects over the left base of uncertain clinical significance. Remainder of the exam is unchanged. IMPRESSION: 1. No acute cardiopulmonary disease. 2. Mild stable cardiomegaly. Mild chronic diffuse interstitial changes. 3. Small metallic foreign body projecting over the left base. Recommend clinical correlation. Electronically Signed   By: Marin Olp M.D.   On: 06/11/2020 15:42    Assessment/Plan  1. COVID-19 -    Was given 3 days of remdesivir in the hospital - Ascorbic Acid (VITAMIN C) 1000 MG tablet; Take 1 tablet (1,000 mg total) by mouth daily.  Dispense: 30 tablet; Refill: 0 - zinc sulfate 220 (50 Zn) MG capsule; Take 1 capsule (220 mg total) by mouth daily.  Dispense: 30 capsule; Refill: 0  2. ILD (interstitial lung disease) ([place]) -  Continue O2 @ 2L/min via [place] continuously - Fluticasone-Umeclidin-Vilant (TRELEGY ELLIPTA) 200-62.5-25 MCG/INH AEPB; Inhale 1 puff into the lungs daily.  Dispense: 28 each; Refill: 0 - guaiFENesin-dextromethorphan (ROBITUSSIN DM) 100-10 MG/5ML syrup; Take 10 mLs by mouth every 4 (four) hours as needed for cough.  Dispense: 118 mL; Refill: 0 - levalbuterol (XOPENEX HFA) 45 MCG/ACT inhaler; Inhale 1 puff into the lungs 3 (three) times daily.  Dispense: 15 g; Refill: 0 -  predniSONE (DELTASONE) 10 MG tablet; Take 1 tablet (10 mg total) by mouth daily with breakfast for 3 days. For 3 days for COPD  Dispense: 3 tablet; Refill: 0 - predniSONE (DELTASONE) 5 MG tablet; Take 1.5 tablets (7.5 mg total) by mouth daily with breakfast. For COPD  Dispense: 24 tablet; Refill: 0  3. Rheumatoid arthritis involving multiple sites with positive rheumatoid factor (HCC) - hydroxychloroquine (PLAQUENIL) 200 MG tablet; Take 1 tablet (200 mg total) by mouth 2 (two) times daily.  Dispense: 60 tablet; Refill: 0 - leflunomide (ARAVA) 20 MG tablet; Take 1 tablet (20 mg total) by mouth every morning.  Dispense: 30 tablet; Refill: 0 - traMADol (ULTRAM) 50  MG tablet; Take 1 tablet (50 mg total) by mouth every 8 (eight) hours as needed for moderate pain.  Dispense: 30 tablet; Refill: 0  4. Essential hypertension - lisinopril (ZESTRIL) 5 MG tablet; Take 1 tablet (5 mg total) by mouth daily.  Dispense: 30 tablet; Refill: 0  5. RLS (restless legs syndrome) - rOPINIRole (REQUIP) 0.25 MG tablet; Take 1 tablet (0.25 mg total) by mouth 3 (three) times daily.  Dispense: 30 tablet; Refill: 0  6. Chronic respiratory failure with hypoxia (HCC) -   Continue O2 at 2 L/minute via [place] continuously  7. Generalized weakness -   For home health PT and OT, for therapeutic and strengthening exercises      I have filled out patient's discharge paperwork and e- prescribed medications.  Patient will have home health PT and OT.  DME provided: Wheelchair and 3- in- 1  Wheelchair   -patient has rheumatoid arthritis and generalized weakness post COVID-19 infection which impairs her ability to perform daily activities like toileting, feeding, dressing, grooming and bathing in the home.  A cane or walker will not resolve issue with performing activities of daily living.  A wheelchair will allow patient to safely perform daily activities.  Patient can safely propel the wheelchair in the home.   Total discharge  time: Greater than 30 minutes Greater than 50% was spent in counseling and coordination of care.    Discharge time involved coordination of the discharge process with social worker, nursing staff and therapy department. Medical justification for home health services/DME verified.    Durenda Age, DNP, MSN, FNP-BC [facility] [phone] (Monday-Friday 8:00 a.m. - 5:00 p.m.) [phone] (after hours)

## 2020-06-23 ENCOUNTER — Encounter: Payer: Self-pay | Admitting: Adult Health

## 2020-06-23 MED ORDER — VITAMIN C 1000 MG PO TABS: 1000.0000 mg | ORAL_TABLET | Freq: Every day | ORAL | 0 refills | Status: AC

## 2020-06-23 MED ORDER — LEFLUNOMIDE 20 MG PO TABS: 20.0000 mg | ORAL_TABLET | Freq: Every morning | ORAL | 0 refills | Status: DC

## 2020-06-23 MED ORDER — PREDNISONE 10 MG PO TABS: 10.0000 mg | ORAL_TABLET | Freq: Every day | ORAL | 0 refills | Status: AC

## 2020-06-23 MED ORDER — LEVALBUTEROL TARTRATE 45 MCG/ACT IN AERO: 1.0000 | INHALATION_SPRAY | Freq: Three times a day (TID) | RESPIRATORY_TRACT | 0 refills | Status: DC

## 2020-06-23 MED ORDER — OMEPRAZOLE 40 MG PO CPDR: 40.0000 mg | DELAYED_RELEASE_CAPSULE | Freq: Every day | ORAL | 0 refills | Status: AC

## 2020-06-23 MED ORDER — GUAIFENESIN-DM 100-10 MG/5ML PO SYRP: 10.0000 mL | ORAL_SOLUTION | ORAL | 0 refills | Status: DC | PRN

## 2020-06-23 MED ORDER — ALENDRONATE SODIUM 70 MG PO TABS: 70.0000 mg | ORAL_TABLET | ORAL | 0 refills | Status: AC

## 2020-06-23 MED ORDER — CALCIUM CARBONATE-VITAMIN D 600-200 MG-UNIT PO TABS: 1.0000 | ORAL_TABLET | Freq: Every day | ORAL | 0 refills | Status: AC

## 2020-06-23 MED ORDER — LISINOPRIL 5 MG PO TABS: 5.0000 mg | ORAL_TABLET | Freq: Every day | ORAL | 0 refills | Status: DC

## 2020-06-23 MED ORDER — ROSUVASTATIN CALCIUM 5 MG PO TABS: 5.0000 mg | ORAL_TABLET | Freq: Every morning | ORAL | 0 refills | Status: AC

## 2020-06-23 MED ORDER — ROPINIROLE HCL 0.25 MG PO TABS: 0.2500 mg | ORAL_TABLET | Freq: Three times a day (TID) | ORAL | 0 refills | Status: DC

## 2020-06-23 MED ORDER — TRAMADOL HCL 50 MG PO TABS: 50.0000 mg | ORAL_TABLET | Freq: Three times a day (TID) | ORAL | 0 refills | Status: DC | PRN

## 2020-06-23 MED ORDER — PREDNISONE 5 MG PO TABS: 7.5000 mg | ORAL_TABLET | Freq: Every day | ORAL | 0 refills | Status: AC

## 2020-06-23 MED ORDER — HYDROXYCHLOROQUINE SULFATE 200 MG PO TABS: 200.0000 mg | ORAL_TABLET | Freq: Two times a day (BID) | ORAL | 0 refills | Status: AC

## 2020-06-23 MED ORDER — ZINC SULFATE 220 (50 ZN) MG PO CAPS
220.0000 mg | ORAL_CAPSULE | Freq: Every day | ORAL | 0 refills | Status: DC
Start: 2020-06-23 — End: 2021-03-09

## 2020-06-23 MED ORDER — TRELEGY ELLIPTA 200-62.5-25 MCG/INH IN AEPB: 1.0000 | INHALATION_SPRAY | Freq: Every day | RESPIRATORY_TRACT | 0 refills | Status: AC

## 2020-06-28 DIAGNOSIS — I10 Essential (primary) hypertension: Secondary | ICD-10-CM | POA: Diagnosis not present

## 2020-06-28 DIAGNOSIS — M4056 Lordosis, unspecified, lumbar region: Secondary | ICD-10-CM | POA: Diagnosis not present

## 2020-06-28 DIAGNOSIS — K219 Gastro-esophageal reflux disease without esophagitis: Secondary | ICD-10-CM | POA: Diagnosis not present

## 2020-06-28 DIAGNOSIS — J449 Chronic obstructive pulmonary disease, unspecified: Secondary | ICD-10-CM | POA: Diagnosis not present

## 2020-06-28 DIAGNOSIS — E785 Hyperlipidemia, unspecified: Secondary | ICD-10-CM | POA: Diagnosis not present

## 2020-06-28 DIAGNOSIS — K635 Polyp of colon: Secondary | ICD-10-CM | POA: Diagnosis not present

## 2020-06-28 DIAGNOSIS — G4733 Obstructive sleep apnea (adult) (pediatric): Secondary | ICD-10-CM | POA: Diagnosis not present

## 2020-06-28 DIAGNOSIS — E669 Obesity, unspecified: Secondary | ICD-10-CM | POA: Diagnosis not present

## 2020-06-28 DIAGNOSIS — I708 Atherosclerosis of other arteries: Secondary | ICD-10-CM | POA: Diagnosis not present

## 2020-06-28 DIAGNOSIS — M05741 Rheumatoid arthritis with rheumatoid factor of right hand without organ or systems involvement: Secondary | ICD-10-CM | POA: Diagnosis not present

## 2020-06-28 DIAGNOSIS — Z6841 Body Mass Index (BMI) 40.0 and over, adult: Secondary | ICD-10-CM | POA: Diagnosis not present

## 2020-06-28 DIAGNOSIS — G2581 Restless legs syndrome: Secondary | ICD-10-CM | POA: Diagnosis not present

## 2020-06-28 DIAGNOSIS — J9611 Chronic respiratory failure with hypoxia: Secondary | ICD-10-CM | POA: Diagnosis not present

## 2020-06-28 DIAGNOSIS — M48061 Spinal stenosis, lumbar region without neurogenic claudication: Secondary | ICD-10-CM | POA: Diagnosis not present

## 2020-06-28 DIAGNOSIS — M5126 Other intervertebral disc displacement, lumbar region: Secondary | ICD-10-CM | POA: Diagnosis not present

## 2020-06-28 DIAGNOSIS — K5790 Diverticulosis of intestine, part unspecified, without perforation or abscess without bleeding: Secondary | ICD-10-CM | POA: Diagnosis not present

## 2020-06-28 DIAGNOSIS — M8938 Hypertrophy of bone, other site: Secondary | ICD-10-CM | POA: Diagnosis not present

## 2020-06-28 DIAGNOSIS — E882 Lipomatosis, not elsewhere classified: Secondary | ICD-10-CM | POA: Diagnosis not present

## 2020-06-28 DIAGNOSIS — G43909 Migraine, unspecified, not intractable, without status migrainosus: Secondary | ICD-10-CM | POA: Diagnosis not present

## 2020-06-28 DIAGNOSIS — I119 Hypertensive heart disease without heart failure: Secondary | ICD-10-CM | POA: Diagnosis not present

## 2020-06-28 DIAGNOSIS — M0579 Rheumatoid arthritis with rheumatoid factor of multiple sites without organ or systems involvement: Secondary | ICD-10-CM | POA: Diagnosis not present

## 2020-06-28 DIAGNOSIS — J441 Chronic obstructive pulmonary disease with (acute) exacerbation: Secondary | ICD-10-CM | POA: Diagnosis not present

## 2020-06-28 DIAGNOSIS — J849 Interstitial pulmonary disease, unspecified: Secondary | ICD-10-CM | POA: Diagnosis not present

## 2020-06-28 DIAGNOSIS — M948X8 Other specified disorders of cartilage, other site: Secondary | ICD-10-CM | POA: Diagnosis not present

## 2020-06-28 DIAGNOSIS — M8588 Other specified disorders of bone density and structure, other site: Secondary | ICD-10-CM | POA: Diagnosis not present

## 2020-06-28 DIAGNOSIS — E7800 Pure hypercholesterolemia, unspecified: Secondary | ICD-10-CM | POA: Diagnosis not present

## 2020-06-28 DIAGNOSIS — I700 Atherosclerosis of aorta: Secondary | ICD-10-CM | POA: Diagnosis not present

## 2020-06-28 DIAGNOSIS — M159 Polyosteoarthritis, unspecified: Secondary | ICD-10-CM | POA: Diagnosis not present

## 2020-06-28 DIAGNOSIS — E782 Mixed hyperlipidemia: Secondary | ICD-10-CM | POA: Diagnosis not present

## 2020-06-29 DIAGNOSIS — J9611 Chronic respiratory failure with hypoxia: Secondary | ICD-10-CM | POA: Diagnosis not present

## 2020-06-29 DIAGNOSIS — I10 Essential (primary) hypertension: Secondary | ICD-10-CM | POA: Diagnosis not present

## 2020-06-29 DIAGNOSIS — J449 Chronic obstructive pulmonary disease, unspecified: Secondary | ICD-10-CM | POA: Diagnosis not present

## 2020-07-03 DIAGNOSIS — M48061 Spinal stenosis, lumbar region without neurogenic claudication: Secondary | ICD-10-CM | POA: Diagnosis not present

## 2020-07-03 DIAGNOSIS — K219 Gastro-esophageal reflux disease without esophagitis: Secondary | ICD-10-CM | POA: Diagnosis not present

## 2020-07-03 DIAGNOSIS — E7800 Pure hypercholesterolemia, unspecified: Secondary | ICD-10-CM | POA: Diagnosis not present

## 2020-07-03 DIAGNOSIS — G4733 Obstructive sleep apnea (adult) (pediatric): Secondary | ICD-10-CM | POA: Diagnosis not present

## 2020-07-03 DIAGNOSIS — I119 Hypertensive heart disease without heart failure: Secondary | ICD-10-CM | POA: Diagnosis not present

## 2020-07-03 DIAGNOSIS — M5126 Other intervertebral disc displacement, lumbar region: Secondary | ICD-10-CM | POA: Diagnosis not present

## 2020-07-03 DIAGNOSIS — J9611 Chronic respiratory failure with hypoxia: Secondary | ICD-10-CM | POA: Diagnosis not present

## 2020-07-03 DIAGNOSIS — E669 Obesity, unspecified: Secondary | ICD-10-CM | POA: Diagnosis not present

## 2020-07-03 DIAGNOSIS — E785 Hyperlipidemia, unspecified: Secondary | ICD-10-CM | POA: Diagnosis not present

## 2020-07-03 DIAGNOSIS — M8588 Other specified disorders of bone density and structure, other site: Secondary | ICD-10-CM | POA: Diagnosis not present

## 2020-07-03 DIAGNOSIS — K635 Polyp of colon: Secondary | ICD-10-CM | POA: Diagnosis not present

## 2020-07-03 DIAGNOSIS — G43909 Migraine, unspecified, not intractable, without status migrainosus: Secondary | ICD-10-CM | POA: Diagnosis not present

## 2020-07-03 DIAGNOSIS — E882 Lipomatosis, not elsewhere classified: Secondary | ICD-10-CM | POA: Diagnosis not present

## 2020-07-03 DIAGNOSIS — Z6841 Body Mass Index (BMI) 40.0 and over, adult: Secondary | ICD-10-CM | POA: Diagnosis not present

## 2020-07-03 DIAGNOSIS — I700 Atherosclerosis of aorta: Secondary | ICD-10-CM | POA: Diagnosis not present

## 2020-07-03 DIAGNOSIS — M4056 Lordosis, unspecified, lumbar region: Secondary | ICD-10-CM | POA: Diagnosis not present

## 2020-07-03 DIAGNOSIS — G2581 Restless legs syndrome: Secondary | ICD-10-CM | POA: Diagnosis not present

## 2020-07-03 DIAGNOSIS — J849 Interstitial pulmonary disease, unspecified: Secondary | ICD-10-CM | POA: Diagnosis not present

## 2020-07-03 DIAGNOSIS — J441 Chronic obstructive pulmonary disease with (acute) exacerbation: Secondary | ICD-10-CM | POA: Diagnosis not present

## 2020-07-03 DIAGNOSIS — M0579 Rheumatoid arthritis with rheumatoid factor of multiple sites without organ or systems involvement: Secondary | ICD-10-CM | POA: Diagnosis not present

## 2020-07-03 DIAGNOSIS — M159 Polyosteoarthritis, unspecified: Secondary | ICD-10-CM | POA: Diagnosis not present

## 2020-07-03 DIAGNOSIS — K5790 Diverticulosis of intestine, part unspecified, without perforation or abscess without bleeding: Secondary | ICD-10-CM | POA: Diagnosis not present

## 2020-07-03 DIAGNOSIS — M8938 Hypertrophy of bone, other site: Secondary | ICD-10-CM | POA: Diagnosis not present

## 2020-07-03 DIAGNOSIS — I708 Atherosclerosis of other arteries: Secondary | ICD-10-CM | POA: Diagnosis not present

## 2020-07-03 DIAGNOSIS — M948X8 Other specified disorders of cartilage, other site: Secondary | ICD-10-CM | POA: Diagnosis not present

## 2020-07-10 DIAGNOSIS — M948X8 Other specified disorders of cartilage, other site: Secondary | ICD-10-CM | POA: Diagnosis not present

## 2020-07-10 DIAGNOSIS — G2581 Restless legs syndrome: Secondary | ICD-10-CM | POA: Diagnosis not present

## 2020-07-10 DIAGNOSIS — Z6841 Body Mass Index (BMI) 40.0 and over, adult: Secondary | ICD-10-CM | POA: Diagnosis not present

## 2020-07-10 DIAGNOSIS — M4056 Lordosis, unspecified, lumbar region: Secondary | ICD-10-CM | POA: Diagnosis not present

## 2020-07-10 DIAGNOSIS — M0579 Rheumatoid arthritis with rheumatoid factor of multiple sites without organ or systems involvement: Secondary | ICD-10-CM | POA: Diagnosis not present

## 2020-07-10 DIAGNOSIS — E785 Hyperlipidemia, unspecified: Secondary | ICD-10-CM | POA: Diagnosis not present

## 2020-07-10 DIAGNOSIS — I700 Atherosclerosis of aorta: Secondary | ICD-10-CM | POA: Diagnosis not present

## 2020-07-10 DIAGNOSIS — K5790 Diverticulosis of intestine, part unspecified, without perforation or abscess without bleeding: Secondary | ICD-10-CM | POA: Diagnosis not present

## 2020-07-10 DIAGNOSIS — M48061 Spinal stenosis, lumbar region without neurogenic claudication: Secondary | ICD-10-CM | POA: Diagnosis not present

## 2020-07-10 DIAGNOSIS — J441 Chronic obstructive pulmonary disease with (acute) exacerbation: Secondary | ICD-10-CM | POA: Diagnosis not present

## 2020-07-10 DIAGNOSIS — M5126 Other intervertebral disc displacement, lumbar region: Secondary | ICD-10-CM | POA: Diagnosis not present

## 2020-07-10 DIAGNOSIS — M159 Polyosteoarthritis, unspecified: Secondary | ICD-10-CM | POA: Diagnosis not present

## 2020-07-10 DIAGNOSIS — I708 Atherosclerosis of other arteries: Secondary | ICD-10-CM | POA: Diagnosis not present

## 2020-07-10 DIAGNOSIS — G4733 Obstructive sleep apnea (adult) (pediatric): Secondary | ICD-10-CM | POA: Diagnosis not present

## 2020-07-10 DIAGNOSIS — J849 Interstitial pulmonary disease, unspecified: Secondary | ICD-10-CM | POA: Diagnosis not present

## 2020-07-10 DIAGNOSIS — E7800 Pure hypercholesterolemia, unspecified: Secondary | ICD-10-CM | POA: Diagnosis not present

## 2020-07-10 DIAGNOSIS — G43909 Migraine, unspecified, not intractable, without status migrainosus: Secondary | ICD-10-CM | POA: Diagnosis not present

## 2020-07-10 DIAGNOSIS — J9611 Chronic respiratory failure with hypoxia: Secondary | ICD-10-CM | POA: Diagnosis not present

## 2020-07-10 DIAGNOSIS — K635 Polyp of colon: Secondary | ICD-10-CM | POA: Diagnosis not present

## 2020-07-10 DIAGNOSIS — M8938 Hypertrophy of bone, other site: Secondary | ICD-10-CM | POA: Diagnosis not present

## 2020-07-10 DIAGNOSIS — E882 Lipomatosis, not elsewhere classified: Secondary | ICD-10-CM | POA: Diagnosis not present

## 2020-07-10 DIAGNOSIS — E669 Obesity, unspecified: Secondary | ICD-10-CM | POA: Diagnosis not present

## 2020-07-10 DIAGNOSIS — M8588 Other specified disorders of bone density and structure, other site: Secondary | ICD-10-CM | POA: Diagnosis not present

## 2020-07-10 DIAGNOSIS — K219 Gastro-esophageal reflux disease without esophagitis: Secondary | ICD-10-CM | POA: Diagnosis not present

## 2020-07-10 DIAGNOSIS — I119 Hypertensive heart disease without heart failure: Secondary | ICD-10-CM | POA: Diagnosis not present

## 2020-07-18 DIAGNOSIS — I708 Atherosclerosis of other arteries: Secondary | ICD-10-CM | POA: Diagnosis not present

## 2020-07-18 DIAGNOSIS — G4733 Obstructive sleep apnea (adult) (pediatric): Secondary | ICD-10-CM | POA: Diagnosis not present

## 2020-07-18 DIAGNOSIS — M8588 Other specified disorders of bone density and structure, other site: Secondary | ICD-10-CM | POA: Diagnosis not present

## 2020-07-18 DIAGNOSIS — Z6841 Body Mass Index (BMI) 40.0 and over, adult: Secondary | ICD-10-CM | POA: Diagnosis not present

## 2020-07-18 DIAGNOSIS — E785 Hyperlipidemia, unspecified: Secondary | ICD-10-CM | POA: Diagnosis not present

## 2020-07-18 DIAGNOSIS — E882 Lipomatosis, not elsewhere classified: Secondary | ICD-10-CM | POA: Diagnosis not present

## 2020-07-18 DIAGNOSIS — I119 Hypertensive heart disease without heart failure: Secondary | ICD-10-CM | POA: Diagnosis not present

## 2020-07-18 DIAGNOSIS — M8938 Hypertrophy of bone, other site: Secondary | ICD-10-CM | POA: Diagnosis not present

## 2020-07-18 DIAGNOSIS — E7800 Pure hypercholesterolemia, unspecified: Secondary | ICD-10-CM | POA: Diagnosis not present

## 2020-07-18 DIAGNOSIS — M0579 Rheumatoid arthritis with rheumatoid factor of multiple sites without organ or systems involvement: Secondary | ICD-10-CM | POA: Diagnosis not present

## 2020-07-18 DIAGNOSIS — M48061 Spinal stenosis, lumbar region without neurogenic claudication: Secondary | ICD-10-CM | POA: Diagnosis not present

## 2020-07-18 DIAGNOSIS — M159 Polyosteoarthritis, unspecified: Secondary | ICD-10-CM | POA: Diagnosis not present

## 2020-07-18 DIAGNOSIS — G43909 Migraine, unspecified, not intractable, without status migrainosus: Secondary | ICD-10-CM | POA: Diagnosis not present

## 2020-07-18 DIAGNOSIS — J849 Interstitial pulmonary disease, unspecified: Secondary | ICD-10-CM | POA: Diagnosis not present

## 2020-07-18 DIAGNOSIS — G2581 Restless legs syndrome: Secondary | ICD-10-CM | POA: Diagnosis not present

## 2020-07-18 DIAGNOSIS — M5126 Other intervertebral disc displacement, lumbar region: Secondary | ICD-10-CM | POA: Diagnosis not present

## 2020-07-18 DIAGNOSIS — E669 Obesity, unspecified: Secondary | ICD-10-CM | POA: Diagnosis not present

## 2020-07-18 DIAGNOSIS — K635 Polyp of colon: Secondary | ICD-10-CM | POA: Diagnosis not present

## 2020-07-18 DIAGNOSIS — J9611 Chronic respiratory failure with hypoxia: Secondary | ICD-10-CM | POA: Diagnosis not present

## 2020-07-18 DIAGNOSIS — M4056 Lordosis, unspecified, lumbar region: Secondary | ICD-10-CM | POA: Diagnosis not present

## 2020-07-18 DIAGNOSIS — K5790 Diverticulosis of intestine, part unspecified, without perforation or abscess without bleeding: Secondary | ICD-10-CM | POA: Diagnosis not present

## 2020-07-18 DIAGNOSIS — I700 Atherosclerosis of aorta: Secondary | ICD-10-CM | POA: Diagnosis not present

## 2020-07-18 DIAGNOSIS — J441 Chronic obstructive pulmonary disease with (acute) exacerbation: Secondary | ICD-10-CM | POA: Diagnosis not present

## 2020-07-18 DIAGNOSIS — M948X8 Other specified disorders of cartilage, other site: Secondary | ICD-10-CM | POA: Diagnosis not present

## 2020-07-18 DIAGNOSIS — K219 Gastro-esophageal reflux disease without esophagitis: Secondary | ICD-10-CM | POA: Diagnosis not present

## 2020-07-25 DIAGNOSIS — J449 Chronic obstructive pulmonary disease, unspecified: Secondary | ICD-10-CM | POA: Diagnosis not present

## 2020-07-25 DIAGNOSIS — E782 Mixed hyperlipidemia: Secondary | ICD-10-CM | POA: Diagnosis not present

## 2020-07-25 DIAGNOSIS — J441 Chronic obstructive pulmonary disease with (acute) exacerbation: Secondary | ICD-10-CM | POA: Diagnosis not present

## 2020-07-25 DIAGNOSIS — M05741 Rheumatoid arthritis with rheumatoid factor of right hand without organ or systems involvement: Secondary | ICD-10-CM | POA: Diagnosis not present

## 2020-07-25 DIAGNOSIS — M810 Age-related osteoporosis without current pathological fracture: Secondary | ICD-10-CM | POA: Diagnosis not present

## 2020-07-25 DIAGNOSIS — I10 Essential (primary) hypertension: Secondary | ICD-10-CM | POA: Diagnosis not present

## 2020-07-31 DIAGNOSIS — G4733 Obstructive sleep apnea (adult) (pediatric): Secondary | ICD-10-CM | POA: Diagnosis not present

## 2020-07-31 DIAGNOSIS — M948X8 Other specified disorders of cartilage, other site: Secondary | ICD-10-CM | POA: Diagnosis not present

## 2020-07-31 DIAGNOSIS — M159 Polyosteoarthritis, unspecified: Secondary | ICD-10-CM | POA: Diagnosis not present

## 2020-07-31 DIAGNOSIS — E882 Lipomatosis, not elsewhere classified: Secondary | ICD-10-CM | POA: Diagnosis not present

## 2020-07-31 DIAGNOSIS — M8938 Hypertrophy of bone, other site: Secondary | ICD-10-CM | POA: Diagnosis not present

## 2020-07-31 DIAGNOSIS — E785 Hyperlipidemia, unspecified: Secondary | ICD-10-CM | POA: Diagnosis not present

## 2020-07-31 DIAGNOSIS — Z6841 Body Mass Index (BMI) 40.0 and over, adult: Secondary | ICD-10-CM | POA: Diagnosis not present

## 2020-07-31 DIAGNOSIS — E669 Obesity, unspecified: Secondary | ICD-10-CM | POA: Diagnosis not present

## 2020-07-31 DIAGNOSIS — I708 Atherosclerosis of other arteries: Secondary | ICD-10-CM | POA: Diagnosis not present

## 2020-07-31 DIAGNOSIS — M4056 Lordosis, unspecified, lumbar region: Secondary | ICD-10-CM | POA: Diagnosis not present

## 2020-07-31 DIAGNOSIS — I700 Atherosclerosis of aorta: Secondary | ICD-10-CM | POA: Diagnosis not present

## 2020-07-31 DIAGNOSIS — I119 Hypertensive heart disease without heart failure: Secondary | ICD-10-CM | POA: Diagnosis not present

## 2020-07-31 DIAGNOSIS — M8588 Other specified disorders of bone density and structure, other site: Secondary | ICD-10-CM | POA: Diagnosis not present

## 2020-07-31 DIAGNOSIS — G43909 Migraine, unspecified, not intractable, without status migrainosus: Secondary | ICD-10-CM | POA: Diagnosis not present

## 2020-07-31 DIAGNOSIS — K219 Gastro-esophageal reflux disease without esophagitis: Secondary | ICD-10-CM | POA: Diagnosis not present

## 2020-07-31 DIAGNOSIS — J9611 Chronic respiratory failure with hypoxia: Secondary | ICD-10-CM | POA: Diagnosis not present

## 2020-07-31 DIAGNOSIS — K635 Polyp of colon: Secondary | ICD-10-CM | POA: Diagnosis not present

## 2020-07-31 DIAGNOSIS — J849 Interstitial pulmonary disease, unspecified: Secondary | ICD-10-CM | POA: Diagnosis not present

## 2020-07-31 DIAGNOSIS — M5126 Other intervertebral disc displacement, lumbar region: Secondary | ICD-10-CM | POA: Diagnosis not present

## 2020-07-31 DIAGNOSIS — M48061 Spinal stenosis, lumbar region without neurogenic claudication: Secondary | ICD-10-CM | POA: Diagnosis not present

## 2020-07-31 DIAGNOSIS — G2581 Restless legs syndrome: Secondary | ICD-10-CM | POA: Diagnosis not present

## 2020-07-31 DIAGNOSIS — J441 Chronic obstructive pulmonary disease with (acute) exacerbation: Secondary | ICD-10-CM | POA: Diagnosis not present

## 2020-07-31 DIAGNOSIS — M0579 Rheumatoid arthritis with rheumatoid factor of multiple sites without organ or systems involvement: Secondary | ICD-10-CM | POA: Diagnosis not present

## 2020-07-31 DIAGNOSIS — E7800 Pure hypercholesterolemia, unspecified: Secondary | ICD-10-CM | POA: Diagnosis not present

## 2020-07-31 DIAGNOSIS — K5790 Diverticulosis of intestine, part unspecified, without perforation or abscess without bleeding: Secondary | ICD-10-CM | POA: Diagnosis not present

## 2020-08-04 DIAGNOSIS — M810 Age-related osteoporosis without current pathological fracture: Secondary | ICD-10-CM | POA: Diagnosis not present

## 2020-08-04 DIAGNOSIS — J849 Interstitial pulmonary disease, unspecified: Secondary | ICD-10-CM | POA: Diagnosis not present

## 2020-08-04 DIAGNOSIS — M0579 Rheumatoid arthritis with rheumatoid factor of multiple sites without organ or systems involvement: Secondary | ICD-10-CM | POA: Diagnosis not present

## 2020-08-04 DIAGNOSIS — Z6841 Body Mass Index (BMI) 40.0 and over, adult: Secondary | ICD-10-CM | POA: Diagnosis not present

## 2020-08-04 DIAGNOSIS — Z79899 Other long term (current) drug therapy: Secondary | ICD-10-CM | POA: Diagnosis not present

## 2020-08-04 DIAGNOSIS — M170 Bilateral primary osteoarthritis of knee: Secondary | ICD-10-CM | POA: Diagnosis not present

## 2020-08-04 DIAGNOSIS — M25511 Pain in right shoulder: Secondary | ICD-10-CM | POA: Diagnosis not present

## 2020-08-11 DIAGNOSIS — I700 Atherosclerosis of aorta: Secondary | ICD-10-CM | POA: Diagnosis not present

## 2020-08-11 DIAGNOSIS — M05741 Rheumatoid arthritis with rheumatoid factor of right hand without organ or systems involvement: Secondary | ICD-10-CM | POA: Diagnosis not present

## 2020-08-11 DIAGNOSIS — G2581 Restless legs syndrome: Secondary | ICD-10-CM | POA: Diagnosis not present

## 2020-08-11 DIAGNOSIS — Z7189 Other specified counseling: Secondary | ICD-10-CM | POA: Diagnosis not present

## 2020-08-11 DIAGNOSIS — E782 Mixed hyperlipidemia: Secondary | ICD-10-CM | POA: Diagnosis not present

## 2020-08-11 DIAGNOSIS — J9611 Chronic respiratory failure with hypoxia: Secondary | ICD-10-CM | POA: Diagnosis not present

## 2020-08-11 DIAGNOSIS — Z79899 Other long term (current) drug therapy: Secondary | ICD-10-CM | POA: Diagnosis not present

## 2020-08-11 DIAGNOSIS — I10 Essential (primary) hypertension: Secondary | ICD-10-CM | POA: Diagnosis not present

## 2020-08-11 DIAGNOSIS — J449 Chronic obstructive pulmonary disease, unspecified: Secondary | ICD-10-CM | POA: Diagnosis not present

## 2020-08-11 DIAGNOSIS — D696 Thrombocytopenia, unspecified: Secondary | ICD-10-CM | POA: Diagnosis not present

## 2020-08-11 DIAGNOSIS — Z1389 Encounter for screening for other disorder: Secondary | ICD-10-CM | POA: Diagnosis not present

## 2020-08-11 DIAGNOSIS — Z0000 Encounter for general adult medical examination without abnormal findings: Secondary | ICD-10-CM | POA: Diagnosis not present

## 2020-08-11 DIAGNOSIS — G4733 Obstructive sleep apnea (adult) (pediatric): Secondary | ICD-10-CM | POA: Diagnosis not present

## 2020-08-14 DIAGNOSIS — M05741 Rheumatoid arthritis with rheumatoid factor of right hand without organ or systems involvement: Secondary | ICD-10-CM | POA: Diagnosis not present

## 2020-08-14 DIAGNOSIS — E782 Mixed hyperlipidemia: Secondary | ICD-10-CM | POA: Diagnosis not present

## 2020-08-14 DIAGNOSIS — I10 Essential (primary) hypertension: Secondary | ICD-10-CM | POA: Diagnosis not present

## 2020-08-14 DIAGNOSIS — M810 Age-related osteoporosis without current pathological fracture: Secondary | ICD-10-CM | POA: Diagnosis not present

## 2020-08-14 DIAGNOSIS — J449 Chronic obstructive pulmonary disease, unspecified: Secondary | ICD-10-CM | POA: Diagnosis not present

## 2020-08-14 DIAGNOSIS — J441 Chronic obstructive pulmonary disease with (acute) exacerbation: Secondary | ICD-10-CM | POA: Diagnosis not present

## 2020-08-16 DIAGNOSIS — I119 Hypertensive heart disease without heart failure: Secondary | ICD-10-CM | POA: Diagnosis not present

## 2020-08-16 DIAGNOSIS — K219 Gastro-esophageal reflux disease without esophagitis: Secondary | ICD-10-CM | POA: Diagnosis not present

## 2020-08-16 DIAGNOSIS — M8938 Hypertrophy of bone, other site: Secondary | ICD-10-CM | POA: Diagnosis not present

## 2020-08-16 DIAGNOSIS — E669 Obesity, unspecified: Secondary | ICD-10-CM | POA: Diagnosis not present

## 2020-08-16 DIAGNOSIS — M4056 Lordosis, unspecified, lumbar region: Secondary | ICD-10-CM | POA: Diagnosis not present

## 2020-08-16 DIAGNOSIS — I700 Atherosclerosis of aorta: Secondary | ICD-10-CM | POA: Diagnosis not present

## 2020-08-16 DIAGNOSIS — I708 Atherosclerosis of other arteries: Secondary | ICD-10-CM | POA: Diagnosis not present

## 2020-08-16 DIAGNOSIS — Z6841 Body Mass Index (BMI) 40.0 and over, adult: Secondary | ICD-10-CM | POA: Diagnosis not present

## 2020-08-16 DIAGNOSIS — M948X8 Other specified disorders of cartilage, other site: Secondary | ICD-10-CM | POA: Diagnosis not present

## 2020-08-16 DIAGNOSIS — E785 Hyperlipidemia, unspecified: Secondary | ICD-10-CM | POA: Diagnosis not present

## 2020-08-16 DIAGNOSIS — M8588 Other specified disorders of bone density and structure, other site: Secondary | ICD-10-CM | POA: Diagnosis not present

## 2020-08-16 DIAGNOSIS — M48061 Spinal stenosis, lumbar region without neurogenic claudication: Secondary | ICD-10-CM | POA: Diagnosis not present

## 2020-08-16 DIAGNOSIS — J9611 Chronic respiratory failure with hypoxia: Secondary | ICD-10-CM | POA: Diagnosis not present

## 2020-08-16 DIAGNOSIS — G2581 Restless legs syndrome: Secondary | ICD-10-CM | POA: Diagnosis not present

## 2020-08-16 DIAGNOSIS — K635 Polyp of colon: Secondary | ICD-10-CM | POA: Diagnosis not present

## 2020-08-16 DIAGNOSIS — E882 Lipomatosis, not elsewhere classified: Secondary | ICD-10-CM | POA: Diagnosis not present

## 2020-08-16 DIAGNOSIS — E7800 Pure hypercholesterolemia, unspecified: Secondary | ICD-10-CM | POA: Diagnosis not present

## 2020-08-16 DIAGNOSIS — K5790 Diverticulosis of intestine, part unspecified, without perforation or abscess without bleeding: Secondary | ICD-10-CM | POA: Diagnosis not present

## 2020-08-16 DIAGNOSIS — G4733 Obstructive sleep apnea (adult) (pediatric): Secondary | ICD-10-CM | POA: Diagnosis not present

## 2020-08-16 DIAGNOSIS — J441 Chronic obstructive pulmonary disease with (acute) exacerbation: Secondary | ICD-10-CM | POA: Diagnosis not present

## 2020-08-16 DIAGNOSIS — M159 Polyosteoarthritis, unspecified: Secondary | ICD-10-CM | POA: Diagnosis not present

## 2020-08-16 DIAGNOSIS — M5126 Other intervertebral disc displacement, lumbar region: Secondary | ICD-10-CM | POA: Diagnosis not present

## 2020-08-16 DIAGNOSIS — G43909 Migraine, unspecified, not intractable, without status migrainosus: Secondary | ICD-10-CM | POA: Diagnosis not present

## 2020-08-16 DIAGNOSIS — J849 Interstitial pulmonary disease, unspecified: Secondary | ICD-10-CM | POA: Diagnosis not present

## 2020-08-16 DIAGNOSIS — M0579 Rheumatoid arthritis with rheumatoid factor of multiple sites without organ or systems involvement: Secondary | ICD-10-CM | POA: Diagnosis not present

## 2020-08-25 ENCOUNTER — Other Ambulatory Visit: Payer: Self-pay

## 2020-08-25 ENCOUNTER — Encounter: Payer: Self-pay | Admitting: Pulmonary Disease

## 2020-08-25 ENCOUNTER — Ambulatory Visit: Payer: PPO | Admitting: Pulmonary Disease

## 2020-08-25 VITALS — BP 126/78 | HR 87 | Temp 98.2°F | Ht 67.0 in | Wt 259.0 lb

## 2020-08-25 DIAGNOSIS — Z9989 Dependence on other enabling machines and devices: Secondary | ICD-10-CM

## 2020-08-25 DIAGNOSIS — J9611 Chronic respiratory failure with hypoxia: Secondary | ICD-10-CM

## 2020-08-25 DIAGNOSIS — J1282 Pneumonia due to coronavirus disease 2019: Secondary | ICD-10-CM | POA: Diagnosis not present

## 2020-08-25 DIAGNOSIS — J849 Interstitial pulmonary disease, unspecified: Secondary | ICD-10-CM

## 2020-08-25 DIAGNOSIS — U071 COVID-19: Secondary | ICD-10-CM | POA: Diagnosis not present

## 2020-08-25 DIAGNOSIS — J449 Chronic obstructive pulmonary disease, unspecified: Secondary | ICD-10-CM

## 2020-08-25 DIAGNOSIS — G4733 Obstructive sleep apnea (adult) (pediatric): Secondary | ICD-10-CM | POA: Diagnosis not present

## 2020-08-25 NOTE — Progress Notes (Signed)
Subjective:   PATIENT ID: [name] GENDER: female DOB: 29-Dec-1940, MRN: [id number]   HPI  Chief Complaint  Patient presents with  . Follow-up    No complaints.     Reason for Visit: Follow-up   [name] is a 80 year old female former smoker with COPD/emphysema and RA-related ILD who presents for follow-up.  Son is present  Synopsis: She is a former patient of Dr. Lenna Gilford. She continues on prednisone 7.5 mg, HCQ and Leflunomide for RA and ILD which is managed by her Rheumatologist Dr. Trudie Reed.   08/25/20 Since our last visit on 01/27/20, she was diagnosed with COVID-19 requiring hospitalization. She was treated with remdesivir. She was also treated for E.coli UTI during that time. She was discharged briefly to rehab for a week. Since her diagnosis she still has some fatigue and shortness of breath. She is not at baseline but is doing better. Only has coughing and wheezing which is aggravated by pollen. She is on chronic prednisone 7.5 mg daily. She has been compliant with her Trelegy. Has not needed to use her nebulizer since her hospitalization. Currently on 1L O2. She has home PT working with her and doing well. Using her CPAP nightly   Social History: Former smoker. Quit in 2003. 46 pack year history.  I have personally reviewed patient's past medical/family/social history/allergies/current medications.   Past Medical History:  Diagnosis Date  . Allergic rhinitis   . Asthma   . Asthmatic bronchitis   . Colon polyps   . COPD (chronic obstructive pulmonary disease) ([place])   . Diverticulosis   . GERD (gastroesophageal reflux disease)   . H/O: GI bleed   . Hypercholesterolemia   . Migraine headache   . Obesity   . RLS (restless legs syndrome)     Outpatient Medications Prior to Visit  Medication Sig Dispense Refill  . acetaminophen (TYLENOL) 500 MG tablet Take 1,000 mg by mouth every 6 (six) hours as needed for moderate pain.    Marland Kitchen alendronate (FOSAMAX) 70 MG tablet  Take 1 tablet (70 mg total) by mouth once a week. 4 tablet 0  . Ascorbic Acid (VITAMIN C) 1000 MG tablet Take 1 tablet (1,000 mg total) by mouth daily. 30 tablet 0  . aspirin 81 MG tablet Take 81 mg by mouth every other day.    . bisacodyl (DULCOLAX) 10 MG suppository Place 10 mg rectally as needed for moderate constipation.    . Calcium Carbonate-Vitamin D 600-200 MG-UNIT TABS Take 1 tablet by mouth daily. 30 tablet 0  . Fluticasone-Umeclidin-Vilant (TRELEGY ELLIPTA) 200-62.5-25 MCG/INH AEPB Inhale 1 puff into the lungs daily. 28 each 0  . guaiFENesin-dextromethorphan (ROBITUSSIN DM) 100-10 MG/5ML syrup Take 10 mLs by mouth every 4 (four) hours as needed for cough. 118 mL 0  . hydroxychloroquine (PLAQUENIL) 200 MG tablet Take 1 tablet (200 mg total) by mouth 2 (two) times daily. 60 tablet 0  . leflunomide (ARAVA) 20 MG tablet Take 1 tablet (20 mg total) by mouth every morning. 30 tablet 0  . levalbuterol (XOPENEX HFA) 45 MCG/ACT inhaler Inhale 1 puff into the lungs 3 (three) times daily. 15 g 0  . lisinopril (ZESTRIL) 5 MG tablet Take 1 tablet (5 mg total) by mouth daily. 30 tablet 0  . loratadine (CLARITIN) 10 MG tablet Take 10 mg by mouth daily.    . magnesium hydroxide (MILK OF MAGNESIA) 400 MG/5ML suspension Take by mouth daily as needed for mild constipation.    Marland Kitchen omeprazole (  PRILOSEC) 40 MG capsule Take 1 capsule (40 mg total) by mouth daily. 30 capsule 0  . OXYGEN Inhale into the lungs. 2 lpm with rest and exertion    . predniSONE (DELTASONE) 5 MG tablet Take 1.5 tablets (7.5 mg total) by mouth daily with breakfast. For COPD 24 tablet 0  . rOPINIRole (REQUIP) 0.25 MG tablet Take 1 tablet (0.25 mg total) by mouth 3 (three) times daily. 30 tablet 0  . rosuvastatin (CRESTOR) 5 MG tablet Take 1 tablet (5 mg total) by mouth every morning. 30 tablet 0  . Sodium Phosphates (RA SALINE ENEMA RE) Place rectally as needed.    . traMADol (ULTRAM) 50 MG tablet Take 1 tablet (50 mg total) by mouth every  8 (eight) hours as needed for moderate pain. 30 tablet 0  . zinc sulfate 220 (50 Zn) MG capsule Take 1 capsule (220 mg total) by mouth daily. 30 capsule 0   No facility-administered medications prior to visit.    Review of Systems  Constitutional: Positive for malaise/fatigue. Negative for chills, diaphoresis, fever and weight loss.  HENT: Negative for congestion.   Respiratory: Positive for cough, shortness of breath and wheezing. Negative for hemoptysis and sputum production.   Cardiovascular: Negative for chest pain, palpitations and leg swelling.    Objective:   Vitals:   08/25/20 1032  BP: 126/78  Pulse: 87  Temp: 98.2 F (36.8 C)  SpO2: 97%  Weight: 259 lb (117.5 kg)  Height: 5\' 7"  (1.702 m)      Physical Exam: General: Well-appearing, no acute distress HENT: [place], AT Eyes: EOMI, no scleral icterus Respiratory: Clear to auscultation bilaterally.  No crackles, wheezing or rales Cardiovascular: RRR, -M/R/G, no JVD Extremities:-Edema,-tenderness Neuro: AAO x4, CNII-XII grossly intact Skin: Intact, no rashes or bruising Psych: Normal mood, normal affect  Data Reviewed:  Imaging: CT Chest 07/28/15 - multiple stable pulmonary nodules, emphysema. Subpleural reticulation in lower lobes in the lung CT Chest 11/05/19 - UIP pattern with basilar predominance of patchy subpleural reticulation with minimal honeycombing. Slightly progressed compared to 2017 chest imaging. Unchanged pulmonary nodules likely benign.  PFT: 03/30/15 FVC 2.29 (75%) FEV1 1.61 (70%) Ratio 68  TLC 77% DLCO 50% Interpretation: Mild obstructive and restrictive lung disease with reduced gas exchange  01/27/20 FVC 2.10 (72%) FEV1 1.44 (66%) Ratio 63  TLC 77% DLCO 68% Interpretation: Moderate obstructive and restrictive lund disease with improved gas exchange. +BD response. Compared to 2016 PFTs, progression that is not significant (<10%)  Labs: CBC    Component Value Date/Time   WBC 12.3 (H)  06/15/2020 0451   RBC 3.38 (L) 06/15/2020 0451   HGB 10.6 (L) 06/15/2020 0451   HCT 33.9 (L) 06/15/2020 0451   PLT 184 06/15/2020 0451   MCV 100.3 (H) 06/15/2020 0451   MCH 31.4 06/15/2020 0451   MCHC 31.3 06/15/2020 0451   RDW 13.4 06/15/2020 0451   LYMPHSABS 1.9 06/15/2020 0451   MONOABS 1.3 (H) 06/15/2020 0451   EOSABS 0.0 06/15/2020 0451   BASOSABS 0.1 06/15/2020 0451   Imaging, labs and test noted above have been reviewed independently by me.  Assessment & Plan:   Discussion: 80 year old female former smoker with COPD/Emphysema and RA-related ILD who presents for follow-up. Symptoms improved after recent COVID-19 infection however not at baseline. She has been compliant with her home O2 and CPAP with perceived benefit.  COVID-19 pneumonia - improving symptoms --Discussed prolonged clinical course for recovery --Wound not recommend changes to current pulmonary regimen --No  indication for CT imaging or repeat pulmonary function tests at this time --If her symptoms persist, we can consider additional testing  Chronic hypoxemic respiratory failure RA-related interstitial lung disease --CONTINUE Trelegy ONE puff ONCE a day --CONTINUE nebulizer as needed for shortness of breath or wheezing --Wear supplemental oxygen with ALL activity and rest. Goal level >88% --CONTINUE activity as tolerated --Please call our office if you develop signs and symptoms concerning for respiratory infection. Levaquin has worked well for you in the past and would be a reasonable antibiotic to consider in the future. (This has been ordered for future use as needed. Please call office when taken.)  OSA on CPAP --Request compliance report --Advised patient to wear CPAP for at least 4 hours each night for greater than 70% of the time to avoid the machine being repossessed by insurance.   Health Maintenance Immunization History  Administered Date(s) Administered  . DT (Pediatric) 09/15/2003  .  Influenza Split 01/01/2009, 02/17/2010, 02/02/2013, 12/30/2014, 01/14/2017, 01/08/2020  . Influenza, High Dose Seasonal PF 01/10/2014, 01/17/2016, 01/14/2017, 12/25/2017, 01/16/2018, 01/11/2019, 01/08/2020  . Influenza,inj,Quad PF,6+ Mos 12/30/2010, 01/13/2012  . Influenza-Unspecified 12/30/2014, 02/02/2019  . Moderna Sars-Covid-2 Vaccination 05/06/2019, 06/11/2019, 07/04/2019  . Pneumococcal Conjugate-13 07/07/2013  . Pneumococcal Polysaccharide-23 09/02/2000, 03/03/2009, 07/31/2016  . Tdap 11/02/2010  . Zoster 05/31/2011, 09/13/2016, 12/14/2016  . Zoster Recombinat (Shingrix) 12/14/2016    No orders of the defined types were placed in this encounter.  No orders of the defined types were placed in this encounter.  Return in about 3 months (around 11/25/2020).  I have spent a total time of 32-minutes on the day of the appointment reviewing prior documentation, coordinating care and discussing medical diagnosis and plan with the patient/family. Imaging, labs and tests included in this note have been reviewed and interpreted independently by me.  [place], MD [facility] 08/25/2020 10:32 AM  Office Number [phone]

## 2020-08-25 NOTE — Patient Instructions (Addendum)
Chronic hypoxemic respiratory failure RA-related interstitial lung disease --CONTINUE Trelegy ONE puff ONCE a day --CONTINUE nebulizer as needed for shortness of breath or wheezing --Wear supplemental oxygen with ALL activity and rest. Goal level >88% --CONTINUE activity as tolerated --Please call our office if you develop signs and symptoms concerning for respiratory infection. Levaquin has worked well for you in the past and would be a reasonable antibiotic to consider in the future. (This has been ordered for future use as needed. Please call office when taken.)  OSA on CPAP --Request compliance report --Advised patient to wear CPAP for at least 4 hours each night for greater than 70% of the time to avoid the machine being repossessed by insurance.   Follow-up with me in 3 months

## 2020-08-26 ENCOUNTER — Encounter: Payer: Self-pay | Admitting: Pulmonary Disease

## 2020-08-26 DIAGNOSIS — G4733 Obstructive sleep apnea (adult) (pediatric): Secondary | ICD-10-CM | POA: Insufficient documentation

## 2020-08-26 DIAGNOSIS — Z9989 Dependence on other enabling machines and devices: Secondary | ICD-10-CM | POA: Insufficient documentation

## 2020-09-04 DIAGNOSIS — M25511 Pain in right shoulder: Secondary | ICD-10-CM | POA: Diagnosis not present

## 2020-09-05 ENCOUNTER — Other Ambulatory Visit: Payer: Self-pay | Admitting: Orthopedic Surgery

## 2020-09-05 DIAGNOSIS — M25511 Pain in right shoulder: Secondary | ICD-10-CM

## 2020-09-07 DIAGNOSIS — D696 Thrombocytopenia, unspecified: Secondary | ICD-10-CM | POA: Diagnosis not present

## 2020-09-14 ENCOUNTER — Ambulatory Visit
Admission: RE | Admit: 2020-09-14 | Discharge: 2020-09-14 | Disposition: A | Discharge status: Home / Self Care | Payer: PPO | Source: Ambulatory Visit | Attending: Orthopedic Surgery | Admitting: Orthopedic Surgery

## 2020-09-14 ENCOUNTER — Other Ambulatory Visit: Payer: Self-pay

## 2020-09-14 DIAGNOSIS — M25511 Pain in right shoulder: Secondary | ICD-10-CM | POA: Diagnosis not present

## 2020-09-21 ENCOUNTER — Other Ambulatory Visit: Payer: Self-pay | Admitting: Adult Health

## 2020-09-21 DIAGNOSIS — M0579 Rheumatoid arthritis with rheumatoid factor of multiple sites without organ or systems involvement: Secondary | ICD-10-CM

## 2020-09-21 DIAGNOSIS — I10 Essential (primary) hypertension: Secondary | ICD-10-CM

## 2020-09-25 DIAGNOSIS — E782 Mixed hyperlipidemia: Secondary | ICD-10-CM | POA: Diagnosis not present

## 2020-09-25 DIAGNOSIS — M25511 Pain in right shoulder: Secondary | ICD-10-CM | POA: Diagnosis not present

## 2020-09-25 DIAGNOSIS — M810 Age-related osteoporosis without current pathological fracture: Secondary | ICD-10-CM | POA: Diagnosis not present

## 2020-09-25 DIAGNOSIS — J449 Chronic obstructive pulmonary disease, unspecified: Secondary | ICD-10-CM | POA: Diagnosis not present

## 2020-09-25 DIAGNOSIS — M05741 Rheumatoid arthritis with rheumatoid factor of right hand without organ or systems involvement: Secondary | ICD-10-CM | POA: Diagnosis not present

## 2020-09-25 DIAGNOSIS — I10 Essential (primary) hypertension: Secondary | ICD-10-CM | POA: Diagnosis not present

## 2020-09-25 DIAGNOSIS — J441 Chronic obstructive pulmonary disease with (acute) exacerbation: Secondary | ICD-10-CM | POA: Diagnosis not present

## 2020-10-10 ENCOUNTER — Other Ambulatory Visit: Payer: Self-pay

## 2020-10-10 ENCOUNTER — Encounter (HOSPITAL_COMMUNITY): Payer: Self-pay | Admitting: Occupational Therapy

## 2020-10-10 ENCOUNTER — Ambulatory Visit (HOSPITAL_COMMUNITY): Payer: PPO | Attending: Orthopedic Surgery | Admitting: Occupational Therapy

## 2020-10-10 DIAGNOSIS — M25511 Pain in right shoulder: Secondary | ICD-10-CM | POA: Diagnosis not present

## 2020-10-10 DIAGNOSIS — R29898 Other symptoms and signs involving the musculoskeletal system: Secondary | ICD-10-CM | POA: Diagnosis not present

## 2020-10-10 NOTE — Patient Instructions (Signed)

## 2020-10-10 NOTE — Therapy (Signed)
[place], [place], [address] Phone: [phone]   Fax:  [phone]  Occupational Therapy Evaluation  Patient Details  Name: [name] MRN: [id number] Date of Birth: 1941-02-23 Referring Provider (OT): Dr. Earlie Server   Encounter Date: 10/10/2020   OT End of Session - 10/10/20 1713     Visit Number 1    Number of Visits 1    Date for OT Re-Evaluation 10/11/20    Authorization Type [employer]    Progress Note Due on Visit 10    OT Start Time 1647    OT Stop Time 1714    OT Time Calculation (min) 27 min    Activity Tolerance Patient tolerated treatment well    Behavior During Therapy [facility] for tasks assessed/performed             Past Medical History:  Diagnosis Date   Allergic rhinitis    Asthma    Asthmatic bronchitis    Colon polyps    COPD (chronic obstructive pulmonary disease) ([place])    Diverticulosis    GERD (gastroesophageal reflux disease)    H/O: GI bleed    Hypercholesterolemia    Migraine headache    Obesity    RLS (restless legs syndrome)     Past Surgical History:  Procedure Laterality Date   TONSILLECTOMY  x2   TOTAL ABDOMINAL HYSTERECTOMY  02/17/1989    There were no vitals filed for this visit.   Subjective Assessment - 10/10/20 1712     Subjective  S: It's feeling so much better    Pertinent History Pt is an 80 y/o female presenting with right shoulder pain present for several weeks. Pt receiving a cortisone injection that has mostly relieved her pain. Pt was referred to occupational therapy for evaluation and treatment by Dr. Earlie Server.    Special Tests FOTO: 64/100    Patient Stated Goals To be able to use my arm normally.    Currently in Pain? No/denies               [facility] OT Assessment - 10/10/20 1647       Assessment   Medical Diagnosis right RCT    Referring Provider (OT) Dr. Earlie Server    Onset Date/Surgical Date 08/13/20    Hand  Dominance Left    Next MD Visit None scheduled    Prior Therapy None      Precautions   Precautions None      Restrictions   Weight Bearing Restrictions No      Balance Screen   Has the patient fallen in the past 6 months No      Prior Function   Level of Independence Independent with household mobility with device;Independent with community mobility with device;Independent with basic ADLs    Vocation Retired    Leisure reading, crochet/knitting, watching TV, spending time with dog      ADL   ADL comments Pt is having difficulty with lifting weighted hanger into the closet, occasional difficulty with reaching. Pt reports signfiicant diffierence in RUE functioning since injection.      Written Expression   Dominant Hand Left      Cognition   Overall Cognitive Status Within Functional Limits for tasks assessed      Observation/Other Assessments   Focus on Therapeutic Outcomes (FOTO)  64/100      ROM / Strength   AROM / PROM / Strength AROM;Strength  AROM   Overall AROM Comments Assessed seated, er/IR adducted    AROM Assessment Site Shoulder    Right/Left Shoulder Right    Right Shoulder Flexion 150 Degrees    Right Shoulder ABduction 161 Degrees    Right Shoulder Internal Rotation 90 Degrees    Right Shoulder External Rotation 35 Degrees      Strength   Overall Strength Comments Assessed seated, er/IR adducted    Strength Assessment Site Shoulder    Right/Left Shoulder Right    Right Shoulder Flexion 4/5    Right Shoulder ABduction 4/5    Right Shoulder Internal Rotation 5/5    Right Shoulder External Rotation 3/5                             OT Education - 10/10/20 1705     Education Details shoulder A/ROM    Person(s) Educated Patient    Methods Explanation;Demonstration;Handout    Comprehension Verbalized understanding;Returned demonstration              OT Short Term Goals - 10/10/20 1718       OT SHORT TERM GOAL #1    Title Pt will be provided with and educated on HEP to improve mobility of RUE during ADLs.    Time 1    Period Days    Status Achieved    Target Date 10/10/20                      Plan - 10/10/20 1714     Clinical Impression Statement A: Pt is an 80 y/o female presenting with right shoulder pain, received cortisone injection with near full relief of pain. Pt reporting she is now using her RUE for all ADLs and is able to lift her arm overhead to reach for items and hang up clothing. During evaluation, pt demonstrating RUE ROM WFL-pt's normal compared to LUE, and strength is WFL-better than LUE. Pt provided with HEP and demonstrates understanding of exercises.    OT Occupational Profile and History Problem Focused Assessment - Including review of records relating to presenting problem    Occupational performance deficits (Please refer to evaluation for details): ADL's;IADL's    Body Structure / Function / Physical Skills ADL;UE functional use;Pain;ROM;IADL;Strength    Clinical Decision Making Limited treatment options, no task modification necessary    Comorbidities Affecting Occupational Performance: None    Modification or Assistance to Complete Evaluation  No modification of tasks or assist necessary to complete eval    OT Frequency One time visit    OT Treatment/Interventions Patient/family education    Plan P: No further OT services required at this time. Pt provided with HEP    OT Home Exercise Plan eval: shoulder A/ROM    Consulted and Agree with Plan of Care Patient             Patient will benefit from skilled therapeutic intervention in order to improve the following deficits and impairments:   Body Structure / Function / Physical Skills: ADL, UE functional use, Pain, ROM, IADL, Strength       Visit Diagnosis: Acute pain of right shoulder  Other symptoms and signs involving the musculoskeletal system    Problem List Patient Active Problem List    Diagnosis Date Noted   OSA on CPAP 08/26/2020   Essential hypertension 06/20/2020   H/O TB skin testing 06/20/2020   Acute lower UTI 06/12/2020  COVID-19 06/11/2020   Chronic respiratory failure with hypoxia ([place]) 06/01/2019   COPD mixed type ([place]) 03/14/2015   Rheumatoid arthritis ([place]) 03/14/2015   Dyspnea 02/06/2015   ILD (interstitial lung disease) ([place]) 02/06/2015   Cough 05/17/2011    Guadelupe Sabin, OTR/L  [phone] 10/10/2020, 5:19 PM  [place], [place], [address] Phone: [phone]   Fax:  [phone]  Name: ANEYA DADDONA MRN: [id number] Date of Birth: [birth year]

## 2020-11-13 ENCOUNTER — Telehealth: Payer: Self-pay | Admitting: Pulmonary Disease

## 2020-11-13 NOTE — Telephone Encounter (Signed)
Called and spoke with Patient.  Patient stated she called [employer], because she is having problems with her simply go mini.  Patient stated she was told by [employer] that they need a new prescription for O2 from Dr. Loanne Drilling. Patient stated she needs her simply go mini repaired by Friday for her upcoming appointments. Patient is scheduled with Dr. Loanne Drilling 12/04/20. Advised Patient she needs qualifying walk to continue O2. Called [employer] to see what was needed.  I was told [employer] spoke with Patient earlier today and explained since her simply go mini is over 36 years old, [employer] will not service it.  Patient was told she needed a re-qualifying walk for O2 and advised by [employer] that if a new POC order was placed, she may receive portable tanks, instead of receiving POC. Called and spoke with Patient.  Advised Patient of what I was told by [employer].  Patient stated she had to had a POC, because she can not carry small tanks.  Patient requested a new DME for POC, if [employer] can not provide POC. Patient scheduled for qualifying walk 11/15/20 at 2pm.

## 2020-11-15 ENCOUNTER — Ambulatory Visit: Payer: PPO

## 2020-11-17 DIAGNOSIS — M0579 Rheumatoid arthritis with rheumatoid factor of multiple sites without organ or systems involvement: Secondary | ICD-10-CM | POA: Diagnosis not present

## 2020-11-17 DIAGNOSIS — J849 Interstitial pulmonary disease, unspecified: Secondary | ICD-10-CM | POA: Diagnosis not present

## 2020-11-17 DIAGNOSIS — M810 Age-related osteoporosis without current pathological fracture: Secondary | ICD-10-CM | POA: Diagnosis not present

## 2020-11-17 DIAGNOSIS — Z6841 Body Mass Index (BMI) 40.0 and over, adult: Secondary | ICD-10-CM | POA: Diagnosis not present

## 2020-11-17 DIAGNOSIS — M170 Bilateral primary osteoarthritis of knee: Secondary | ICD-10-CM | POA: Diagnosis not present

## 2020-11-17 DIAGNOSIS — Z79899 Other long term (current) drug therapy: Secondary | ICD-10-CM | POA: Diagnosis not present

## 2020-11-22 DIAGNOSIS — J449 Chronic obstructive pulmonary disease, unspecified: Secondary | ICD-10-CM | POA: Diagnosis not present

## 2020-12-04 ENCOUNTER — Other Ambulatory Visit: Payer: Self-pay

## 2020-12-04 ENCOUNTER — Encounter: Payer: Self-pay | Admitting: Pulmonary Disease

## 2020-12-04 ENCOUNTER — Ambulatory Visit: Payer: PPO | Admitting: Pulmonary Disease

## 2020-12-04 VITALS — BP 142/90 | HR 85 | Temp 98.0°F | Ht 67.0 in | Wt 240.0 lb

## 2020-12-04 DIAGNOSIS — G4733 Obstructive sleep apnea (adult) (pediatric): Secondary | ICD-10-CM

## 2020-12-04 DIAGNOSIS — J849 Interstitial pulmonary disease, unspecified: Secondary | ICD-10-CM | POA: Diagnosis not present

## 2020-12-04 DIAGNOSIS — J9611 Chronic respiratory failure with hypoxia: Secondary | ICD-10-CM | POA: Diagnosis not present

## 2020-12-04 DIAGNOSIS — Z9989 Dependence on other enabling machines and devices: Secondary | ICD-10-CM | POA: Diagnosis not present

## 2020-12-04 NOTE — Progress Notes (Signed)
Subjective:   PATIENT ID: [name] GENDER: female DOB: Jul 15, 1940, MRN: [id number]   HPI  Chief Complaint  Patient presents with   Follow-up    Pt states concerns for breathing. Pts states SOB    Reason for Visit: Follow-up   [name] is a 80 year old female former smoker with COPD/emphysema and RA-related ILD who presents for follow-up.  [name] is present  Synopsis: She is a former patient of [name]. She continues on prednisone 7.5 mg, HCQ and Leflunomide for RA and ILD which is managed by her Rheumatologist [name].   08/25/20 Since our last visit on 01/27/20, she was diagnosed with COVID-19 requiring hospitalization. She was treated with remdesivir. She was also treated for E.coli UTI during that time. She was discharged briefly to rehab for a week. Since her diagnosis she still has some fatigue and shortness of breath. She is not at baseline but is doing better. Only has coughing and wheezing which is aggravated by pollen. She is on chronic prednisone 7.5 mg daily. She has been compliant with her Trelegy. Has not needed to use her nebulizer since her hospitalization. Currently on 1L O2. She has home PT working with her and doing well. Using her CPAP nightly   12/04/20 She reports overall her COPD and ILD symptoms are well controlled. Activity is still limited. She previously was participating in home physical therapy for 6 weeks. Walking to the mailbox can be difficult.  She will have shortness of breath with exertion.  No coughing or wheezing. Compliant with Trelegy.  Rarely uses rescue inhaler.  She is compliant with her oxygen however device is >5 years and having errors.  She was recently evaluated by her PCP and had an inconclusive ambulatory test that did not determine desaturations.  She was sent to pulmonary to reevaluate.  For her OSA she reports good quality sleep when using her CPAP and is compliant with device nightly.  Social History: Former smoker. Quit in  2003. 46 pack year history.  Past Medical History:  Diagnosis Date   Allergic rhinitis    Asthma    Asthmatic bronchitis    Colon polyps    COPD (chronic obstructive pulmonary disease) (HCC)    Diverticulosis    GERD (gastroesophageal reflux disease)    H/O: GI bleed    Hypercholesterolemia    Migraine headache    Obesity    RLS (restless legs syndrome)     Outpatient Medications Prior to Visit  Medication Sig Dispense Refill   acetaminophen (TYLENOL) 500 MG tablet Take 1,000 mg by mouth every 6 (six) hours as needed for moderate pain.     alendronate (FOSAMAX) 70 MG tablet Take 1 tablet (70 mg total) by mouth once a week. 4 tablet 0   Ascorbic Acid (VITAMIN C) 1000 MG tablet Take 1 tablet (1,000 mg total) by mouth daily. 30 tablet 0   aspirin 81 MG tablet Take 81 mg by mouth every other day.     bisacodyl (DULCOLAX) 10 MG suppository Place 10 mg rectally as needed for moderate constipation.     Calcium Carbonate-Vitamin D 600-200 MG-UNIT TABS Take 1 tablet by mouth daily. 30 tablet 0   Fluticasone-Umeclidin-Vilant (TRELEGY ELLIPTA) 200-62.5-25 MCG/INH AEPB Inhale 1 puff into the lungs daily. 28 each 0   guaiFENesin-dextromethorphan (ROBITUSSIN DM) 100-10 MG/5ML syrup Take 10 mLs by mouth every 4 (four) hours as needed for cough. 118 mL 0   hydroxychloroquine (PLAQUENIL) 200 MG tablet Take 1  tablet (200 mg total) by mouth 2 (two) times daily. 60 tablet 0   leflunomide (ARAVA) 20 MG tablet TAKE ONE TABLET BY MOUTH EVERY MORNING 30 tablet 0   levalbuterol (XOPENEX HFA) 45 MCG/ACT inhaler Inhale 1 puff into the lungs 3 (three) times daily. 15 g 0   lisinopril (ZESTRIL) 5 MG tablet TAKE ONE TABLET BY MOUTH EVERY MORNING 30 tablet 0   loratadine (CLARITIN) 10 MG tablet Take 10 mg by mouth daily.     magnesium hydroxide (MILK OF MAGNESIA) 400 MG/5ML suspension Take by mouth daily as needed for mild constipation.     omeprazole (PRILOSEC) 40 MG capsule Take 1 capsule (40 mg total) by mouth  daily. 30 capsule 0   OXYGEN Inhale into the lungs. 2 lpm with rest and exertion     predniSONE (DELTASONE) 5 MG tablet Take 1.5 tablets (7.5 mg total) by mouth daily with breakfast. For COPD 24 tablet 0   rOPINIRole (REQUIP) 0.25 MG tablet Take 1 tablet (0.25 mg total) by mouth 3 (three) times daily. (Patient taking differently: Take 1 mg by mouth once.) 30 tablet 0   rosuvastatin (CRESTOR) 5 MG tablet Take 1 tablet (5 mg total) by mouth every morning. 30 tablet 0   Sodium Phosphates (RA SALINE ENEMA RE) Place rectally as needed.     traMADol (ULTRAM) 50 MG tablet Take 1 tablet (50 mg total) by mouth every 8 (eight) hours as needed for moderate pain. 30 tablet 0   zinc sulfate 220 (50 Zn) MG capsule Take 1 capsule (220 mg total) by mouth daily. 30 capsule 0   No facility-administered medications prior to visit.    Review of Systems  Constitutional:  Negative for chills, diaphoresis, fever, malaise/fatigue and weight loss.  HENT:  Negative for congestion.   Respiratory:  Positive for shortness of breath. Negative for cough, hemoptysis, sputum production and wheezing.   Cardiovascular:  Negative for chest pain, palpitations and leg swelling.   Objective:   Vitals:   12/04/20 1027  BP: (!) 142/90  Pulse: 85  Temp: 98 F (36.7 C)  TempSrc: Oral  SpO2: 96%  Weight: 240 lb (108.9 kg)  Height: '5\' 7"'$  (1.702 m)   SpO2: 96 % O2 Device: Nasal cannula O2 Flow Rate (L/min): 1 L/min  Physical Exam: General: Well-appearing, no acute distress, wearing nasal cannula HENT: [place], AT Eyes: EOMI, no scleral icterus Respiratory: Clear to auscultation bilaterally.  No crackles, wheezing or rales Cardiovascular: RRR, -M/R/G, no JVD Extremities:-Edema,-tenderness Neuro: AAO x4, CNII-XII grossly intact Psych: Normal mood, normal affect  Data Reviewed:  Imaging: CT Chest 07/28/15 - multiple stable pulmonary nodules, emphysema. Subpleural reticulation in lower lobes in the lung CT Chest 11/05/19 -  UIP pattern with basilar predominance of patchy subpleural reticulation with minimal honeycombing. Slightly progressed compared to 2017 chest imaging. Unchanged pulmonary nodules likely benign.  PFT: 03/30/15 FVC 2.29 (75%) FEV1 1.61 (70%) Ratio 68  TLC 77% DLCO 50% Interpretation: Mild obstructive and restrictive lung disease with reduced gas exchange  11/19/2019 FVC 2.10 (72%) FEV1 1.44 (66%) Ratio 63  TLC 77% DLCO 68% Interpretation: Moderate obstructive and restrictive lund disease with improved gas exchange. +BD response. Compared to 2016 PFTs, progression that is not significant (<10%)  Labs: CBC    Component Value Date/Time   WBC 12.3 (H) 06/15/2020 0451   RBC 3.38 (L) 06/15/2020 0451   HGB 10.6 (L) 06/15/2020 0451   HCT 33.9 (L) 06/15/2020 0451   PLT 184 06/15/2020 0451  MCV 100.3 (H) 06/15/2020 0451   MCH 31.4 06/15/2020 0451   MCHC 31.3 06/15/2020 0451   RDW 13.4 06/15/2020 0451   LYMPHSABS 1.9 06/15/2020 0451   MONOABS 1.3 (H) 06/15/2020 0451   EOSABS 0.0 06/15/2020 0451   BASOSABS 0.1 06/15/2020 0451   CPAP compliance 11/04/20-12/03/20 Usage days 100% >4 hours >4 hours  Ambulatory O2 12/04/2020 SPO2 on room air at rest-91% SPO2 on room air with ambulation-86% SPO2 on 3 L O2 with ambulation-92%   Assessment & Plan:   Discussion: 80 year old female former smoker with COPD/emphysema and RA related ILD who presents for follow-up.  She is compliant with her bronchodilators, home O2 and CPAP.  Ambulatory O2 demonstrates desaturations requiring 3 L O2.  We reviewed management of her chronic lung disease as noted below.  RA-related interstitial lung disease --CONTINUE Trelegy ONE puff ONCE a day --CONTINUE nebulizer as needed for shortness of breath or wheezing  Chronic hypoxemic respiratory failure --Ambulatory O2 demonstrates desaturation to 86%. Recommend 3L oxygen with activity and sleep. Goal SpO2 >88% --CONTINUE activity as tolerated --Will send oxygen request  to [place] --Will also send note to your PCP, Dr. Felipa Eth at [facility] to inform office that we have ordered oxygen to prevent delay.  OSA on CPAP --Reviewed CPAP compliance report --Patient uses NIV for more than four hours nightly for more then 4 hours per night on 100% of nights during the last three months of usage.  --Please call our office if you develop signs and symptoms concerning for respiratory infection. Levaquin has worked well for you in the past and would be a reasonable antibiotic to consider in the future.    Health Maintenance Immunization History  Administered Date(s) Administered   DT (Pediatric) 09/15/2003   Influenza Split 01/01/2009, 02/17/2010, 02/02/2013, 12/30/2014, 01/14/2017, 01/08/2020   Influenza, High Dose Seasonal PF 01/10/2014, 01/17/2016, 01/14/2017, 12/25/2017, 01/16/2018, 01/11/2019, 01/08/2020   Influenza,inj,Quad PF,6+ Mos 12/30/2010, 01/13/2012   Influenza-Unspecified 12/30/2014, 02/02/2019   Moderna Sars-Covid-2 Vaccination 05/06/2019, 06/11/2019, 07/04/2019   Pneumococcal Conjugate-13 07/07/2013   Pneumococcal Polysaccharide-23 09/02/2000, 03/03/2009, 07/31/2016   Tdap 11/02/2010   Zoster Recombinat (Shingrix) 12/14/2016   Zoster, Live 05/31/2011, 09/13/2016, 12/14/2016    Orders Placed This Encounter  Procedures   Ambulatory Referral for DME    Referral Priority:   Routine    Referral Type:   Durable Medical Equipment Purchase    Number of Visits Requested:   1   No orders of the defined types were placed in this encounter.  Return in about 3 months (around 03/06/2021).  I have spent a total time of 38-minutes on the day of the appointment reviewing prior documentation, coordinating care and discussing medical diagnosis and plan with the patient/family. Past medical history, allergies, medications were reviewed. Pertinent imaging, labs and tests included in this note have been reviewed and interpreted independently by me.  [place], MD [facility] 12/04/2020 10:41 AM  Office Number [phone]

## 2020-12-04 NOTE — Patient Instructions (Signed)
  RA-related interstitial lung disease --CONTINUE Trelegy ONE puff ONCE a day --CONTINUE nebulizer as needed for shortness of breath or wheezing  Chronic hypoxemic respiratory failure --Ambulatory O2 demonstrates desaturation to 85%. Recommend 1L oxygen with activity and sleep. Goal SpO2 >88% --CONTINUE activity as tolerated --Will send oxygen request to [place] --Will also send note to your PCP, Dr. Felipa Eth at [facility] to inform office that we have ordered oxygen to prevent delay.  OSA on CPAP --Reviewed CPAP compliance report --Patient uses NIV for more than four hours nightly for more then 4 hours per night on 100% of nights during the last three months of usage.  --Please call our office if you develop signs and symptoms concerning for respiratory infection. Levaquin has worked well for you in the past and would be a reasonable antibiotic to consider in the future.

## 2020-12-07 ENCOUNTER — Encounter: Payer: Self-pay | Admitting: Pulmonary Disease

## 2020-12-15 DIAGNOSIS — E782 Mixed hyperlipidemia: Secondary | ICD-10-CM | POA: Diagnosis not present

## 2020-12-15 DIAGNOSIS — M05741 Rheumatoid arthritis with rheumatoid factor of right hand without organ or systems involvement: Secondary | ICD-10-CM | POA: Diagnosis not present

## 2020-12-15 DIAGNOSIS — J441 Chronic obstructive pulmonary disease with (acute) exacerbation: Secondary | ICD-10-CM | POA: Diagnosis not present

## 2020-12-15 DIAGNOSIS — I10 Essential (primary) hypertension: Secondary | ICD-10-CM | POA: Diagnosis not present

## 2020-12-15 DIAGNOSIS — M810 Age-related osteoporosis without current pathological fracture: Secondary | ICD-10-CM | POA: Diagnosis not present

## 2020-12-15 DIAGNOSIS — J449 Chronic obstructive pulmonary disease, unspecified: Secondary | ICD-10-CM | POA: Diagnosis not present

## 2021-01-05 DIAGNOSIS — R0602 Shortness of breath: Secondary | ICD-10-CM | POA: Diagnosis not present

## 2021-01-31 ENCOUNTER — Other Ambulatory Visit: Payer: Self-pay | Admitting: Adult Health

## 2021-01-31 DIAGNOSIS — M0579 Rheumatoid arthritis with rheumatoid factor of multiple sites without organ or systems involvement: Secondary | ICD-10-CM

## 2021-02-04 DIAGNOSIS — R0602 Shortness of breath: Secondary | ICD-10-CM | POA: Diagnosis not present

## 2021-02-13 ENCOUNTER — Other Ambulatory Visit: Payer: Self-pay | Admitting: Geriatric Medicine

## 2021-02-13 DIAGNOSIS — Z1231 Encounter for screening mammogram for malignant neoplasm of breast: Secondary | ICD-10-CM

## 2021-02-16 DIAGNOSIS — M0579 Rheumatoid arthritis with rheumatoid factor of multiple sites without organ or systems involvement: Secondary | ICD-10-CM | POA: Diagnosis not present

## 2021-03-02 ENCOUNTER — Other Ambulatory Visit: Payer: Self-pay

## 2021-03-02 ENCOUNTER — Inpatient Hospital Stay (HOSPITAL_COMMUNITY): Payer: PPO

## 2021-03-02 ENCOUNTER — Encounter (HOSPITAL_COMMUNITY): Payer: Self-pay

## 2021-03-02 ENCOUNTER — Emergency Department (HOSPITAL_COMMUNITY): Payer: PPO

## 2021-03-02 ENCOUNTER — Inpatient Hospital Stay (HOSPITAL_COMMUNITY)
Admission: EM | Admit: 2021-03-02 | Discharge: 2021-03-09 | DRG: 064 | Disposition: A | Discharge status: Skilled Nursing Facility | Payer: PPO | Attending: Student | Admitting: Student

## 2021-03-02 DIAGNOSIS — S199XXA Unspecified injury of neck, initial encounter: Secondary | ICD-10-CM | POA: Diagnosis not present

## 2021-03-02 DIAGNOSIS — I7771 Dissection of carotid artery: Secondary | ICD-10-CM

## 2021-03-02 DIAGNOSIS — S2242XA Multiple fractures of ribs, left side, initial encounter for closed fracture: Secondary | ICD-10-CM | POA: Diagnosis present

## 2021-03-02 DIAGNOSIS — I672 Cerebral atherosclerosis: Secondary | ICD-10-CM | POA: Diagnosis not present

## 2021-03-02 DIAGNOSIS — M0579 Rheumatoid arthritis with rheumatoid factor of multiple sites without organ or systems involvement: Secondary | ICD-10-CM

## 2021-03-02 DIAGNOSIS — I700 Atherosclerosis of aorta: Secondary | ICD-10-CM | POA: Diagnosis not present

## 2021-03-02 DIAGNOSIS — Y92009 Unspecified place in unspecified non-institutional (private) residence as the place of occurrence of the external cause: Secondary | ICD-10-CM | POA: Diagnosis not present

## 2021-03-02 DIAGNOSIS — I517 Cardiomegaly: Secondary | ICD-10-CM | POA: Diagnosis not present

## 2021-03-02 DIAGNOSIS — J9600 Acute respiratory failure, unspecified whether with hypoxia or hypercapnia: Secondary | ICD-10-CM | POA: Diagnosis not present

## 2021-03-02 DIAGNOSIS — E7800 Pure hypercholesterolemia, unspecified: Secondary | ICD-10-CM | POA: Diagnosis present

## 2021-03-02 DIAGNOSIS — W19XXXA Unspecified fall, initial encounter: Secondary | ICD-10-CM | POA: Diagnosis present

## 2021-03-02 DIAGNOSIS — Z6841 Body Mass Index (BMI) 40.0 and over, adult: Secondary | ICD-10-CM | POA: Diagnosis not present

## 2021-03-02 DIAGNOSIS — Z79899 Other long term (current) drug therapy: Secondary | ICD-10-CM

## 2021-03-02 DIAGNOSIS — I69354 Hemiplegia and hemiparesis following cerebral infarction affecting left non-dominant side: Secondary | ICD-10-CM | POA: Diagnosis not present

## 2021-03-02 DIAGNOSIS — R451 Restlessness and agitation: Secondary | ICD-10-CM | POA: Diagnosis present

## 2021-03-02 DIAGNOSIS — R414 Neurologic neglect syndrome: Secondary | ICD-10-CM | POA: Diagnosis present

## 2021-03-02 DIAGNOSIS — M069 Rheumatoid arthritis, unspecified: Secondary | ICD-10-CM | POA: Diagnosis present

## 2021-03-02 DIAGNOSIS — R5381 Other malaise: Secondary | ICD-10-CM | POA: Diagnosis not present

## 2021-03-02 DIAGNOSIS — S81819A Laceration without foreign body, unspecified lower leg, initial encounter: Secondary | ICD-10-CM | POA: Diagnosis not present

## 2021-03-02 DIAGNOSIS — Z20822 Contact with and (suspected) exposure to covid-19: Secondary | ICD-10-CM | POA: Diagnosis not present

## 2021-03-02 DIAGNOSIS — G4733 Obstructive sleep apnea (adult) (pediatric): Secondary | ICD-10-CM

## 2021-03-02 DIAGNOSIS — J9611 Chronic respiratory failure with hypoxia: Secondary | ICD-10-CM | POA: Diagnosis not present

## 2021-03-02 DIAGNOSIS — I10 Essential (primary) hypertension: Secondary | ICD-10-CM | POA: Diagnosis not present

## 2021-03-02 DIAGNOSIS — Z7983 Long term (current) use of bisphosphonates: Secondary | ICD-10-CM

## 2021-03-02 DIAGNOSIS — I63411 Cerebral infarction due to embolism of right middle cerebral artery: Secondary | ICD-10-CM | POA: Diagnosis not present

## 2021-03-02 DIAGNOSIS — R29818 Other symptoms and signs involving the nervous system: Secondary | ICD-10-CM | POA: Diagnosis not present

## 2021-03-02 DIAGNOSIS — R0602 Shortness of breath: Secondary | ICD-10-CM | POA: Diagnosis not present

## 2021-03-02 DIAGNOSIS — S2249XD Multiple fractures of ribs, unspecified side, subsequent encounter for fracture with routine healing: Secondary | ICD-10-CM | POA: Diagnosis not present

## 2021-03-02 DIAGNOSIS — R29703 NIHSS score 3: Secondary | ICD-10-CM | POA: Diagnosis present

## 2021-03-02 DIAGNOSIS — J449 Chronic obstructive pulmonary disease, unspecified: Secondary | ICD-10-CM | POA: Diagnosis not present

## 2021-03-02 DIAGNOSIS — S81812A Laceration without foreign body, left lower leg, initial encounter: Secondary | ICD-10-CM | POA: Diagnosis not present

## 2021-03-02 DIAGNOSIS — Z882 Allergy status to sulfonamides status: Secondary | ICD-10-CM

## 2021-03-02 DIAGNOSIS — Z880 Allergy status to penicillin: Secondary | ICD-10-CM

## 2021-03-02 DIAGNOSIS — Z9981 Dependence on supplemental oxygen: Secondary | ICD-10-CM

## 2021-03-02 DIAGNOSIS — G2581 Restless legs syndrome: Secondary | ICD-10-CM | POA: Diagnosis not present

## 2021-03-02 DIAGNOSIS — Z66 Do not resuscitate: Secondary | ICD-10-CM | POA: Diagnosis not present

## 2021-03-02 DIAGNOSIS — I959 Hypotension, unspecified: Secondary | ICD-10-CM | POA: Diagnosis not present

## 2021-03-02 DIAGNOSIS — I6523 Occlusion and stenosis of bilateral carotid arteries: Secondary | ICD-10-CM | POA: Diagnosis not present

## 2021-03-02 DIAGNOSIS — K219 Gastro-esophageal reflux disease without esophagitis: Secondary | ICD-10-CM | POA: Diagnosis present

## 2021-03-02 DIAGNOSIS — M47812 Spondylosis without myelopathy or radiculopathy, cervical region: Secondary | ICD-10-CM | POA: Diagnosis not present

## 2021-03-02 DIAGNOSIS — Z888 Allergy status to other drugs, medicaments and biological substances status: Secondary | ICD-10-CM

## 2021-03-02 DIAGNOSIS — Z87891 Personal history of nicotine dependence: Secondary | ICD-10-CM

## 2021-03-02 DIAGNOSIS — I4891 Unspecified atrial fibrillation: Secondary | ICD-10-CM | POA: Diagnosis not present

## 2021-03-02 DIAGNOSIS — I639 Cerebral infarction, unspecified: Secondary | ICD-10-CM | POA: Diagnosis not present

## 2021-03-02 DIAGNOSIS — Z9989 Dependence on other enabling machines and devices: Secondary | ICD-10-CM

## 2021-03-02 DIAGNOSIS — I63511 Cerebral infarction due to unspecified occlusion or stenosis of right middle cerebral artery: Secondary | ICD-10-CM | POA: Diagnosis present

## 2021-03-02 DIAGNOSIS — I63233 Cerebral infarction due to unspecified occlusion or stenosis of bilateral carotid arteries: Secondary | ICD-10-CM | POA: Diagnosis not present

## 2021-03-02 DIAGNOSIS — R0902 Hypoxemia: Secondary | ICD-10-CM | POA: Diagnosis not present

## 2021-03-02 DIAGNOSIS — R000 Tachycardia, unspecified: Secondary | ICD-10-CM | POA: Diagnosis not present

## 2021-03-02 DIAGNOSIS — Z823 Family history of stroke: Secondary | ICD-10-CM

## 2021-03-02 DIAGNOSIS — I671 Cerebral aneurysm, nonruptured: Secondary | ICD-10-CM | POA: Diagnosis not present

## 2021-03-02 DIAGNOSIS — G8194 Hemiplegia, unspecified affecting left nondominant side: Secondary | ICD-10-CM | POA: Diagnosis not present

## 2021-03-02 DIAGNOSIS — M6281 Muscle weakness (generalized): Secondary | ICD-10-CM | POA: Diagnosis not present

## 2021-03-02 DIAGNOSIS — R278 Other lack of coordination: Secondary | ICD-10-CM | POA: Diagnosis not present

## 2021-03-02 DIAGNOSIS — I69391 Dysphagia following cerebral infarction: Secondary | ICD-10-CM | POA: Diagnosis not present

## 2021-03-02 DIAGNOSIS — F419 Anxiety disorder, unspecified: Secondary | ICD-10-CM | POA: Diagnosis present

## 2021-03-02 DIAGNOSIS — R262 Difficulty in walking, not elsewhere classified: Secondary | ICD-10-CM | POA: Diagnosis not present

## 2021-03-02 DIAGNOSIS — I720 Aneurysm of carotid artery: Secondary | ICD-10-CM | POA: Diagnosis not present

## 2021-03-02 DIAGNOSIS — S0990XA Unspecified injury of head, initial encounter: Secondary | ICD-10-CM | POA: Diagnosis not present

## 2021-03-02 DIAGNOSIS — R0689 Other abnormalities of breathing: Secondary | ICD-10-CM | POA: Diagnosis not present

## 2021-03-02 DIAGNOSIS — Z7982 Long term (current) use of aspirin: Secondary | ICD-10-CM

## 2021-03-02 DIAGNOSIS — I480 Paroxysmal atrial fibrillation: Secondary | ICD-10-CM | POA: Diagnosis not present

## 2021-03-02 DIAGNOSIS — Z7951 Long term (current) use of inhaled steroids: Secondary | ICD-10-CM

## 2021-03-02 DIAGNOSIS — R269 Unspecified abnormalities of gait and mobility: Secondary | ICD-10-CM | POA: Diagnosis present

## 2021-03-02 DIAGNOSIS — S2249XA Multiple fractures of ribs, unspecified side, initial encounter for closed fracture: Secondary | ICD-10-CM | POA: Diagnosis present

## 2021-03-02 DIAGNOSIS — Z7401 Bed confinement status: Secondary | ICD-10-CM | POA: Diagnosis not present

## 2021-03-02 DIAGNOSIS — I248 Other forms of acute ischemic heart disease: Secondary | ICD-10-CM | POA: Diagnosis not present

## 2021-03-02 DIAGNOSIS — I63412 Cerebral infarction due to embolism of left middle cerebral artery: Secondary | ICD-10-CM | POA: Diagnosis not present

## 2021-03-02 DIAGNOSIS — I69318 Other symptoms and signs involving cognitive functions following cerebral infarction: Secondary | ICD-10-CM | POA: Diagnosis not present

## 2021-03-02 DIAGNOSIS — R7989 Other specified abnormal findings of blood chemistry: Secondary | ICD-10-CM | POA: Diagnosis not present

## 2021-03-02 DIAGNOSIS — Z7952 Long term (current) use of systemic steroids: Secondary | ICD-10-CM

## 2021-03-02 DIAGNOSIS — N390 Urinary tract infection, site not specified: Secondary | ICD-10-CM | POA: Diagnosis present

## 2021-03-02 DIAGNOSIS — R079 Chest pain, unspecified: Secondary | ICD-10-CM | POA: Diagnosis not present

## 2021-03-02 DIAGNOSIS — J9811 Atelectasis: Secondary | ICD-10-CM | POA: Diagnosis not present

## 2021-03-02 DIAGNOSIS — Z91013 Allergy to seafood: Secondary | ICD-10-CM

## 2021-03-02 DIAGNOSIS — J439 Emphysema, unspecified: Secondary | ICD-10-CM | POA: Diagnosis not present

## 2021-03-02 DIAGNOSIS — J849 Interstitial pulmonary disease, unspecified: Secondary | ICD-10-CM | POA: Diagnosis not present

## 2021-03-02 LAB — TROPONIN I (HIGH SENSITIVITY)
Troponin I (High Sensitivity): 19 ng/L — ABNORMAL HIGH (ref <18)
Troponin I (High Sensitivity): 22 ng/L — ABNORMAL HIGH (ref <18)

## 2021-03-02 LAB — CBC WITH DIFFERENTIAL/PLATELET
Abs Immature Granulocytes: 0.1 10*3/uL — ABNORMAL HIGH (ref 0.00–0.07)
Basophils Absolute: 0.1 10*3/uL (ref 0.0–0.1)
Basophils Relative: 1 %
Eosinophils Absolute: 0.1 10*3/uL (ref 0.0–0.5)
Eosinophils Relative: 1 %
HCT: 38.4 % (ref 36.0–46.0)
Hemoglobin: 12.4 g/dL (ref 12.0–15.0)
Immature Granulocytes: 1 %
Lymphocytes Relative: 14 %
Lymphs Abs: 2.3 10*3/uL (ref 0.7–4.0)
MCH: 32.8 pg (ref 26.0–34.0)
MCHC: 32.3 g/dL (ref 30.0–36.0)
MCV: 101.6 fL — ABNORMAL HIGH (ref 80.0–100.0)
Monocytes Absolute: 2 10*3/uL — ABNORMAL HIGH (ref 0.1–1.0)
Monocytes Relative: 12 %
Neutro Abs: 11.3 10*3/uL — ABNORMAL HIGH (ref 1.7–7.7)
Neutrophils Relative %: 71 %
Platelets: 135 10*3/uL — ABNORMAL LOW (ref 150–400)
RBC: 3.78 MIL/uL — ABNORMAL LOW (ref 3.87–5.11)
RDW: 13.7 % (ref 11.5–15.5)
WBC: 15.8 10*3/uL — ABNORMAL HIGH (ref 4.0–10.5)
nRBC: 0 % (ref 0.0–0.2)

## 2021-03-02 LAB — URINALYSIS, ROUTINE W REFLEX MICROSCOPIC
Bilirubin Urine: NEGATIVE
Glucose, UA: NEGATIVE mg/dL
Ketones, ur: NEGATIVE mg/dL
Nitrite: POSITIVE — AB
Protein, ur: 30 mg/dL — AB
Specific Gravity, Urine: 1.015 (ref 1.005–1.030)
WBC, UA: >50 WBC/hpf — ABNORMAL HIGH (ref 0–5)
pH: 5 (ref 5.0–8.0)

## 2021-03-02 LAB — COMPREHENSIVE METABOLIC PANEL
ALT: 17 U/L (ref 0–44)
AST: 30 U/L (ref 15–41)
Albumin: 3.6 g/dL (ref 3.5–5.0)
Alkaline Phosphatase: 63 U/L (ref 38–126)
Anion gap: 13 (ref 5–15)
BUN: 19 mg/dL (ref 8–23)
CO2: 23 mmol/L (ref 22–32)
Calcium: 8.5 mg/dL — ABNORMAL LOW (ref 8.9–10.3)
Chloride: 105 mmol/L (ref 98–111)
Creatinine, Ser: 0.94 mg/dL (ref 0.44–1.00)
GFR, Estimated: >60 mL/min (ref >60)
Glucose, Bld: 142 mg/dL — ABNORMAL HIGH (ref 70–99)
Potassium: 4 mmol/L (ref 3.5–5.1)
Sodium: 141 mmol/L (ref 135–145)
Total Bilirubin: 0.9 mg/dL (ref 0.3–1.2)
Total Protein: 6.7 g/dL (ref 6.5–8.1)

## 2021-03-02 LAB — CBG MONITORING, ED: Glucose-Capillary: 141 mg/dL — ABNORMAL HIGH (ref 70–99)

## 2021-03-02 LAB — PROTIME-INR
INR: 0.9 (ref 0.8–1.2)
Prothrombin Time: 12.5 seconds (ref 11.4–15.2)

## 2021-03-02 LAB — RESP PANEL BY RT-PCR (FLU A&B, COVID) ARPGX2
Influenza A by PCR: NEGATIVE
Influenza B by PCR: NEGATIVE
SARS Coronavirus 2 by RT PCR: NEGATIVE

## 2021-03-02 MED ORDER — LORATADINE 10 MG PO TABS
10.0000 mg | ORAL_TABLET | Freq: Every day | ORAL | Status: DC
  Administered 2021-03-03 – 2021-03-09 (×7): 10 mg via ORAL
  Filled 2021-03-02 (×7): qty 1

## 2021-03-02 MED ORDER — BISACODYL 10 MG RE SUPP: 10.0000 mg | RECTAL | Status: DC | PRN

## 2021-03-02 MED ORDER — ASPIRIN 81 MG PO TABS: 81.0000 mg | ORAL_TABLET | ORAL | Status: DC

## 2021-03-02 MED ORDER — ROSUVASTATIN CALCIUM 20 MG PO TABS
20.0000 mg | ORAL_TABLET | Freq: Every day | ORAL | Status: DC
  Administered 2021-03-03 – 2021-03-09 (×7): 20 mg via ORAL
  Filled 2021-03-02 (×7): qty 1

## 2021-03-02 MED ORDER — FLUTICASONE FUROATE-VILANTEROL 200-25 MCG/ACT IN AEPB
1.0000 | INHALATION_SPRAY | Freq: Every day | RESPIRATORY_TRACT | Status: DC
  Administered 2021-03-03 – 2021-03-09 (×7): 1 via RESPIRATORY_TRACT
  Filled 2021-03-02: qty 28

## 2021-03-02 MED ORDER — ASPIRIN EC 81 MG PO TBEC
81.0000 mg | DELAYED_RELEASE_TABLET | Freq: Every day | ORAL | Status: DC
  Administered 2021-03-02 – 2021-03-04 (×3): 81 mg via ORAL
  Filled 2021-03-02 (×3): qty 1

## 2021-03-02 MED ORDER — SODIUM CHLORIDE 0.9 % IV SOLN: 2.0000 g | INTRAVENOUS | Status: DC

## 2021-03-02 MED ORDER — BACITRACIN ZINC 500 UNIT/GM EX OINT
TOPICAL_OINTMENT | Freq: Two times a day (BID) | CUTANEOUS | Status: DC
  Administered 2021-03-06: 1 via TOPICAL
  Filled 2021-03-02: qty 28.4
  Filled 2021-03-02: qty 0.9

## 2021-03-02 MED ORDER — FENTANYL CITRATE PF 50 MCG/ML IJ SOSY
50.0000 ug | PREFILLED_SYRINGE | Freq: Once | INTRAMUSCULAR | Status: AC
  Administered 2021-03-02: 50 ug via INTRAVENOUS
  Filled 2021-03-02: qty 1

## 2021-03-02 MED ORDER — ACETAMINOPHEN 160 MG/5ML PO SOLN: 650.0000 mg | ORAL | Status: DC | PRN

## 2021-03-02 MED ORDER — SENNOSIDES-DOCUSATE SODIUM 8.6-50 MG PO TABS
1.0000 | ORAL_TABLET | Freq: Every evening | ORAL | Status: DC | PRN
  Administered 2021-03-07: 1 via ORAL
  Filled 2021-03-02: qty 1

## 2021-03-02 MED ORDER — ACETAMINOPHEN 325 MG PO TABS
650.0000 mg | ORAL_TABLET | ORAL | Status: DC | PRN
  Administered 2021-03-03 – 2021-03-08 (×2): 650 mg via ORAL
  Filled 2021-03-02 (×2): qty 2

## 2021-03-02 MED ORDER — HYDROXYCHLOROQUINE SULFATE 200 MG PO TABS
200.0000 mg | ORAL_TABLET | Freq: Two times a day (BID) | ORAL | Status: DC
  Administered 2021-03-03 – 2021-03-09 (×14): 200 mg via ORAL
  Filled 2021-03-02 (×14): qty 1

## 2021-03-02 MED ORDER — ONDANSETRON HCL 4 MG/2ML IJ SOLN
4.0000 mg | Freq: Once | INTRAMUSCULAR | Status: AC
  Administered 2021-03-02: 4 mg via INTRAVENOUS
  Filled 2021-03-02: qty 2

## 2021-03-02 MED ORDER — SODIUM CHLORIDE 0.9 % IV BOLUS
1000.0000 mL | Freq: Once | INTRAVENOUS | Status: AC
  Administered 2021-03-02: 1000 mL via INTRAVENOUS

## 2021-03-02 MED ORDER — SODIUM CHLORIDE 0.9 % IV SOLN: INTRAVENOUS | Status: DC

## 2021-03-02 MED ORDER — SODIUM CHLORIDE 0.9 % IV SOLN
1.0000 g | INTRAVENOUS | Status: AC
  Administered 2021-03-02 – 2021-03-06 (×5): 1 g via INTRAVENOUS
  Filled 2021-03-02 (×5): qty 10

## 2021-03-02 MED ORDER — FLUTICASONE-UMECLIDIN-VILANT 200-62.5-25 MCG/ACT IN AEPB: 1.0000 | INHALATION_SPRAY | Freq: Every day | RESPIRATORY_TRACT | Status: DC

## 2021-03-02 MED ORDER — PREDNISONE 5 MG PO TABS
7.5000 mg | ORAL_TABLET | Freq: Every day | ORAL | Status: DC
  Administered 2021-03-03 – 2021-03-09 (×7): 7.5 mg via ORAL
  Filled 2021-03-02 (×2): qty 2
  Filled 2021-03-02: qty 1
  Filled 2021-03-02 (×3): qty 2
  Filled 2021-03-02: qty 1
  Filled 2021-03-02: qty 2
  Filled 2021-03-02: qty 1
  Filled 2021-03-02: qty 2

## 2021-03-02 MED ORDER — ACETAMINOPHEN 650 MG RE SUPP: 650.0000 mg | RECTAL | Status: DC | PRN

## 2021-03-02 MED ORDER — LEFLUNOMIDE 20 MG PO TABS
20.0000 mg | ORAL_TABLET | Freq: Every morning | ORAL | Status: DC
  Administered 2021-03-03 – 2021-03-09 (×7): 20 mg via ORAL
  Filled 2021-03-02 (×9): qty 1

## 2021-03-02 MED ORDER — SODIUM CHLORIDE 0.9 % IV SOLN: 12.5000 mg | Freq: Once | INTRAVENOUS | Status: DC

## 2021-03-02 MED ORDER — IPRATROPIUM-ALBUTEROL 0.5-2.5 (3) MG/3ML IN SOLN
3.0000 mL | Freq: Once | RESPIRATORY_TRACT | Status: AC
  Administered 2021-03-02: 3 mL via RESPIRATORY_TRACT
  Filled 2021-03-02: qty 3

## 2021-03-02 MED ORDER — MORPHINE SULFATE (PF) 4 MG/ML IV SOLN
4.0000 mg | Freq: Once | INTRAVENOUS | Status: AC
  Administered 2021-03-02: 4 mg via INTRAVENOUS
  Filled 2021-03-02: qty 1

## 2021-03-02 MED ORDER — IOHEXOL 350 MG/ML SOLN
100.0000 mL | Freq: Once | INTRAVENOUS | Status: AC | PRN
  Administered 2021-03-02: 100 mL via INTRAVENOUS

## 2021-03-02 MED ORDER — LORAZEPAM 2 MG/ML IJ SOLN: 0.5000 mg | Freq: Once | INTRAMUSCULAR | Status: DC

## 2021-03-02 MED ORDER — ROPINIROLE HCL 1 MG PO TABS
1.0000 mg | ORAL_TABLET | Freq: Every day | ORAL | Status: DC
  Administered 2021-03-03 – 2021-03-08 (×7): 1 mg via ORAL
  Filled 2021-03-02 (×7): qty 1

## 2021-03-02 MED ORDER — LIDOCAINE-EPINEPHRINE (PF) 2 %-1:200000 IJ SOLN
10.0000 mL | Freq: Once | INTRAMUSCULAR | Status: AC
  Administered 2021-03-02: 10 mL
  Filled 2021-03-02: qty 20

## 2021-03-02 MED ORDER — UMECLIDINIUM BROMIDE 62.5 MCG/ACT IN AEPB
1.0000 | INHALATION_SPRAY | Freq: Every day | RESPIRATORY_TRACT | Status: DC
  Administered 2021-03-03 – 2021-03-09 (×7): 1 via RESPIRATORY_TRACT
  Filled 2021-03-02: qty 7

## 2021-03-02 MED ORDER — STROKE: EARLY STAGES OF RECOVERY BOOK
Freq: Once | Status: DC
  Filled 2021-03-02: qty 1

## 2021-03-02 MED ORDER — CLOPIDOGREL BISULFATE 75 MG PO TABS
75.0000 mg | ORAL_TABLET | Freq: Every day | ORAL | Status: DC
  Administered 2021-03-02 – 2021-03-03 (×2): 75 mg via ORAL
  Filled 2021-03-02 (×2): qty 1

## 2021-03-02 MED ORDER — TRAMADOL HCL 50 MG PO TABS
50.0000 mg | ORAL_TABLET | Freq: Three times a day (TID) | ORAL | Status: DC | PRN
  Administered 2021-03-03 (×2): 50 mg via ORAL
  Filled 2021-03-02 (×2): qty 1

## 2021-03-02 MED ORDER — LORAZEPAM 2 MG/ML IJ SOLN
1.0000 mg | Freq: Once | INTRAMUSCULAR | Status: AC
  Administered 2021-03-02: 1 mg via INTRAVENOUS
  Filled 2021-03-02: qty 1

## 2021-03-02 NOTE — ED Notes (Signed)
Pt placed on bedpan and emesis bag given.

## 2021-03-02 NOTE — ED Notes (Addendum)
Code Stroke called by Dr Gilford Raid.

## 2021-03-02 NOTE — ED Notes (Signed)
Wound care provided to left lower leg.

## 2021-03-02 NOTE — ED Notes (Signed)
Pt back from CT, BP retaken.

## 2021-03-02 NOTE — ED Notes (Signed)
CT called for update on pt's place in line.

## 2021-03-02 NOTE — ED Notes (Signed)
Report given to [facility]. 

## 2021-03-02 NOTE — ED Triage Notes (Addendum)
Pt arrived via EMS. EMS called out for a fall, pt fell on brick. Pt does not remember if she hit or head or not, denies taking blood thinners. Lacerations noted to the left lower leg. Pt confused about situation states that she felt like she had to use the bathroom and can't breathe. Pt also complaining of chest pain with inspiration. Hx of COPD and wears 3L [place] at baseline.

## 2021-03-02 NOTE — Progress Notes (Signed)
Code stroke called after patient had already had CT head/neck for fall.

## 2021-03-02 NOTE — ED Notes (Signed)
Pt dry heaving. Hospitalist aware.

## 2021-03-02 NOTE — ED Notes (Signed)
Pt transported back to CT 

## 2021-03-02 NOTE — ED Notes (Signed)
Dr. Gilford Raid at bedside suturing wound.

## 2021-03-02 NOTE — H&P (Addendum)
History and Physical  [name] PZW:[id number] DOB: 07/24/1940 DOA: 03/02/2021  Referring physician: Dr Gilford Raid, ED physician PCP: Lajean Manes, MD  Outpatient Specialists:   Patient Coming From: home  Chief Complaint: fall, weakness  HPI: [name] is a 80 y.o. female with a history of COPD/asthma, hypertension, rheumatoid arthritis, GERD, obstructive sleep apnea.  Patient was found down on the floor by her son earlier today.  The patient was last known well around 10 PM last night when she went to bed.  She does not remember when she got up, but stated that she got up in order to use the bathroom but was unable to get to her walker and fell.  She was having difficulty moving the left side of her body.  Her son tried to get her touch with her and when she did not answer he broke into her house and found her on the ground.  There is no palliating or provoking factors.  Patient was transported to the hospital for evaluation.  Emergency Department Course: CT of the head shows right MCA stroke with right carotid artery dissection.  Labs relatively normal.  White count 15  Review of Systems:   Pt denies any fevers, chills, nausea, vomiting, diarrhea, constipation, abdominal pain, shortness of breath, dyspnea on exertion, orthopnea, cough, wheezing, palpitations, headache, vision changes, lightheadedness, dizziness, melena, rectal bleeding.  Review of systems are otherwise negative  Past Medical History:  Diagnosis Date   Allergic rhinitis    Asthma    Asthmatic bronchitis    Colon polyps    COPD (chronic obstructive pulmonary disease) (HCC)    Diverticulosis    GERD (gastroesophageal reflux disease)    H/O: GI bleed    Hypercholesterolemia    Migraine headache    Obesity    RLS (restless legs syndrome)    Past Surgical History:  Procedure Laterality Date   TONSILLECTOMY  x2   TOTAL ABDOMINAL HYSTERECTOMY  02/17/1989   Social History:  reports that she quit smoking about 19  years ago. Her smoking use included cigarettes. She started smoking about 66 years ago. She has a 46.00 pack-year smoking history. She has never used smokeless tobacco. She reports that she does not drink alcohol and does not use drugs. Patient lives at home  Allergies  Allergen Reactions   Shellfish Allergy Anaphylaxis    With vomiting and diarrhea   Atorvastatin Other (See Comments)    Leg muscle pain   Sertraline Hcl     Unknown reaction   Macrolides And Ketolides Rash   Penicillins Other (See Comments)    Facial numbness   Sulfa Antibiotics Other (See Comments)    Facial numbness    Family History  Problem Relation Age of Onset   Allergies Mother    Asthma Mother    Breast cancer Mother    Allergies Father       Prior to Admission medications   Medication Sig Start Date End Date Taking? Authorizing Provider  acetaminophen (TYLENOL) 500 MG tablet Take 1,000 mg by mouth every 6 (six) hours as needed for moderate pain.    [provider]  alendronate (FOSAMAX) 70 MG tablet Take 1 tablet (70 mg total) by mouth once a week. 06/23/20   Medina-Vargas, Monina C, NP  Ascorbic Acid (VITAMIN C) 1000 MG tablet Take 1 tablet (1,000 mg total) by mouth daily. 06/23/20   Medina-Vargas, Monina C, NP  aspirin 81 MG tablet Take 81 mg by mouth every other day.  [provider]  bisacodyl (DULCOLAX) 10 MG suppository Place 10 mg rectally as needed for moderate constipation.    [provider]  Calcium Carbonate-Vitamin D 600-200 MG-UNIT TABS Take 1 tablet by mouth daily. 06/23/20   Medina-Vargas, Monina C, NP  Fluticasone-Umeclidin-Vilant (TRELEGY ELLIPTA) 200-62.5-25 MCG/INH AEPB Inhale 1 puff into the lungs daily. 06/23/20   Medina-Vargas, Monina C, NP  guaiFENesin-dextromethorphan (ROBITUSSIN DM) 100-10 MG/5ML syrup Take 10 mLs by mouth every 4 (four) hours as needed for cough. 06/23/20   Medina-Vargas, Monina C, NP  hydroxychloroquine (PLAQUENIL) 200 MG tablet Take 1  tablet (200 mg total) by mouth 2 (two) times daily. 06/23/20   Medina-Vargas, Monina C, NP  leflunomide (ARAVA) 20 MG tablet TAKE ONE TABLET BY MOUTH EVERY MORNING 09/21/20   Gerlene Fee, NP  levalbuterol [facility] HFA) 45 MCG/ACT inhaler Inhale 1 puff into the lungs 3 (three) times daily. 06/23/20   Medina-Vargas, Monina C, NP  lisinopril (ZESTRIL) 5 MG tablet TAKE ONE TABLET BY MOUTH EVERY MORNING 09/21/20   Gerlene Fee, NP  loratadine (CLARITIN) 10 MG tablet Take 10 mg by mouth daily.    [provider]  magnesium hydroxide (MILK OF MAGNESIA) 400 MG/5ML suspension Take by mouth daily as needed for mild constipation.    [provider]  omeprazole (PRILOSEC) 40 MG capsule Take 1 capsule (40 mg total) by mouth daily. 06/23/20   Medina-Vargas, Monina C, NP  OXYGEN Inhale into the lungs. 2 lpm with rest and exertion    [provider]  predniSONE (DELTASONE) 5 MG tablet Take 1.5 tablets (7.5 mg total) by mouth daily with breakfast. For COPD 06/27/20   Medina-Vargas, Monina C, NP  rOPINIRole (REQUIP) 0.25 MG tablet Take 1 tablet (0.25 mg total) by mouth 3 (three) times daily. Patient taking differently: Take 1 mg by mouth once. 06/23/20   Medina-Vargas, Monina C, NP  rosuvastatin (CRESTOR) 5 MG tablet Take 1 tablet (5 mg total) by mouth every morning. 06/23/20   Medina-Vargas, Monina C, NP  Sodium Phosphates (RA SALINE ENEMA RE) Place rectally as needed.    [provider]  traMADol (ULTRAM) 50 MG tablet Take 1 tablet (50 mg total) by mouth every 8 (eight) hours as needed for moderate pain. 06/23/20   Medina-Vargas, Monina C, NP  zinc sulfate 220 (50 Zn) MG capsule Take 1 capsule (220 mg total) by mouth daily. 06/23/20   Medina-Vargas, Monina C, NP    Physical Exam: BP (!) 166/102   Pulse (!) 109   Temp 99.5 F (37.5 C) (Rectal)   Resp 18   SpO2 100%   General: Elderly female. Awake and alert and oriented x3.  Patient appears to be quite short of breath with  chest pain. HEENT: Normocephalic atraumatic.  Right and left ears normal in appearance.  Pupils equal, round, reactive to light. Extraocular muscles are intact. Sclerae anicteric and noninjected.  Moist mucosal membranes. No mucosal lesions.  Neck: Neck supple without lymphadenopathy. No carotid bruits. No masses palpated.  Cardiovascular: Regular rate with normal S1-S2 sounds. No murmurs, rubs, gallops auscultated. No JVD.  Respiratory: Good respiratory effort with no wheezes, rales, rhonchi. Lungs clear to auscultation bilaterally.  No accessory muscle use. Abdomen: Soft, nontender, nondistended. Active bowel sounds. No masses or hepatosplenomegaly  Skin: No rashes, lesions, or ulcerations.  Patient does have a laceration with abrasions on her left lower leg.  Dry, warm to touch. 2+ dorsalis pedis and radial pulses. Musculoskeletal: No calf or leg pain. All  major joints not erythematous nontender.  No upper or lower joint deformation.  Good ROM.  No contractures  Psychiatric: Intact judgment and insight. Pleasant and cooperative. Neurologic: Diminished sensation in her left upper extremity, in particular her hand.  Movement is intact, although patient has a significant tremor in her left hand.  Cranial nerves II through XII are grossly intact.           Labs on Admission: I have personally reviewed following labs and imaging studies  CBC: Recent Labs  Lab 03/02/21 1158  WBC 15.8*  NEUTROABS 11.3*  HGB 12.4  HCT 38.4  MCV 101.6*  PLT 160*   Basic Metabolic Panel: Recent Labs  Lab 03/02/21 1158  NA 141  K 4.0  CL 105  CO2 23  GLUCOSE 142*  BUN 19  CREATININE 0.94  CALCIUM 8.5*   GFR: CrCl cannot be calculated (Unknown ideal weight.). Liver Function Tests: Recent Labs  Lab 03/02/21 1158  AST 30  ALT 17  ALKPHOS 63  BILITOT 0.9  PROT 6.7  ALBUMIN 3.6   No results for input(s): LIPASE, AMYLASE in the last 168 hours. No results for input(s): AMMONIA in the last 168  hours. Coagulation Profile: Recent Labs  Lab 03/02/21 1158  INR 0.9   Cardiac Enzymes: No results for input(s): CKTOTAL, CKMB, CKMBINDEX, TROPONINI in the last 168 hours. BNP (last 3 results) No results for input(s): PROBNP in the last 8760 hours. HbA1C: No results for input(s): HGBA1C in the last 72 hours. CBG: Recent Labs  Lab 03/02/21 1206  GLUCAP 141*   Lipid Profile: No results for input(s): CHOL, HDL, LDLCALC, TRIG, CHOLHDL, LDLDIRECT in the last 72 hours. Thyroid Function Tests: No results for input(s): TSH, T4TOTAL, FREET4, T3FREE, THYROIDAB in the last 72 hours. Anemia Panel: No results for input(s): VITAMINB12, FOLATE, FERRITIN, TIBC, IRON, RETICCTPCT in the last 72 hours. Urine analysis:    Component Value Date/Time   COLORURINE YELLOW 03/02/2021 1446   APPEARANCEUR CLOUDY (A) 03/02/2021 1446   LABSPEC 1.015 03/02/2021 1446   PHURINE 5.0 03/02/2021 1446   GLUCOSEU NEGATIVE 03/02/2021 1446   HGBUR SMALL (A) 03/02/2021 1446   BILIRUBINUR NEGATIVE 03/02/2021 1446   KETONESUR NEGATIVE 03/02/2021 1446   PROTEINUR 30 (A) 03/02/2021 1446   NITRITE POSITIVE (A) 03/02/2021 1446   LEUKOCYTESUR LARGE (A) 03/02/2021 1446   Sepsis Labs: @LABRCNTIP (procalcitonin:4,lacticidven:4) ) Recent Results (from the past 240 hour(s))  Resp Panel by RT-PCR (Flu A&B, Covid) Nasopharyngeal Swab     Status: None   Collection Time: 03/02/21  3:30 PM   Specimen: Nasopharyngeal Swab; Nasopharyngeal(NP) swabs in vial transport medium  Result Value Ref Range Status   SARS Coronavirus 2 by RT PCR NEGATIVE NEGATIVE Final    Comment: (NOTE) SARS-CoV-2 target nucleic acids are NOT DETECTED.  The SARS-CoV-2 RNA is generally detectable in upper respiratory specimens during the acute phase of infection. The lowest concentration of SARS-CoV-2 viral copies this assay can detect is 138 copies/mL. A negative result does not preclude SARS-Cov-2 infection and should not be used as the sole basis  for treatment or other patient management decisions. A negative result may occur with  improper specimen collection/handling, submission of specimen other than nasopharyngeal swab, presence of viral mutation(s) within the areas targeted by this assay, and inadequate number of viral copies(<138 copies/mL). A negative result must be combined with clinical observations, patient history, and epidemiological information. The expected result is Negative.  Fact Sheet for Patients:  EntrepreneurPulse.com.au  Fact Sheet for Healthcare  Providers:  IncredibleEmployment.be  This test is no t yet approved or cleared by the [place] and  has been authorized for detection and/or diagnosis of SARS-CoV-2 by FDA under an Emergency Use Authorization (EUA). This EUA will remain  in effect (meaning this test can be used) for the duration of the COVID-19 declaration under Section 564(b)(1) of the Act, 21 U.S.C.section 360bbb-3(b)(1), unless the authorization is terminated  or revoked sooner.       Influenza A by PCR NEGATIVE NEGATIVE Final   Influenza B by PCR NEGATIVE NEGATIVE Final    Comment: (NOTE) The Xpert Xpress SARS-CoV-2/FLU/RSV plus assay is intended as an aid in the diagnosis of influenza from Nasopharyngeal swab specimens and should not be used as a sole basis for treatment. Nasal washings and aspirates are unacceptable for Xpert Xpress SARS-CoV-2/FLU/RSV testing.  Fact Sheet for Patients: EntrepreneurPulse.com.au  Fact Sheet for Healthcare Providers: IncredibleEmployment.be  This test is not yet approved or cleared by the [place] FDA and has been authorized for detection and/or diagnosis of SARS-CoV-2 by FDA under an Emergency Use Authorization (EUA). This EUA will remain in effect (meaning this test can be used) for the duration of the COVID-19 declaration under Section 564(b)(1) of the Act, 21  U.S.C. section 360bbb-3(b)(1), unless the authorization is terminated or revoked.  Performed at [facility], [address] [place], [place] [address]      Radiological Exams on Admission: CT HEAD WO CONTRAST  Result Date: 03/02/2021 CLINICAL DATA:  Head trauma, minor (Age >= 65y); Neck trauma (Age >= 65y) EXAM: CT HEAD WITHOUT CONTRAST CT CERVICAL SPINE WITHOUT CONTRAST TECHNIQUE: Multidetector CT imaging of the head and cervical spine was performed following the standard protocol without intravenous contrast. Multiplanar CT image reconstructions of the cervical spine were also generated. COMPARISON:  06/11/2020 FINDINGS: CT HEAD FINDINGS Brain: Large area of decreased attenuation with loss of gray-white differentiation predominantly involving the right parietal lobe suspicious for acute infarction. No evidence of intracranial hemorrhage. No extra-axial collection. Prior left basal ganglia lacunar infarct. Scattered low-density changes within the periventricular and subcortical white matter compatible with chronic microvascular ischemic change. Mild diffuse cerebral volume loss. Vascular: Atherosclerotic calcifications involving the large vessels of the skull base. 7 mm partially peripherally calcified structure in the region of the proximal right MCA, possibly reflecting a small partially calcified aneurysm sac. No unexpected hyperdense vessel. Skull: Normal. Negative for fracture or focal lesion. Sinuses/Orbits: No acute finding. Other: None. CT CERVICAL SPINE FINDINGS Alignment: Facet joints are aligned without dislocation or traumatic listhesis. Dens and lateral masses are aligned. Straightening of the cervical lordosis. Skull base and vertebrae: No acute fracture. No primary bone lesion or focal pathologic process. Soft tissues and spinal canal: No prevertebral fluid or swelling. No visible canal hematoma. Disc levels: Degenerative disc disease most pronounced at C5-6 and C6-7. Multilevel  bilateral facet arthropathy. Upper chest: Negative. Other: Medial course of the bilateral common carotid arteries. Carotid atherosclerosis. IMPRESSION: 1. Findings suggestive of acute right MCA territory infarction. Further evaluation with MRI of the brain is recommended. 2. Incidentally noted 7 mm partially peripherally calcified structure in the region of the proximal right MCA, suspicious for a small partially calcified aneurysm sac. Attention on follow-up vascular imaging. 3. No acute fracture or traumatic listhesis of the cervical spine. 4. Degenerative disc disease and facet arthropathy of the cervical spine, most pronounced at C5-6 and C6-7. These results were called by telephone at the time of interpretation on 03/02/2021 at 1:35 pm  to provider Isla Pence , who verbally acknowledged these results. Electronically Signed   By: Davina Poke D.O.   On: 03/02/2021 13:35   CT Chest Wo Contrast  Result Date: 03/02/2021 CLINICAL DATA:  Shortness of breath, fall history of COPD, asthma EXAM: CT CHEST WITHOUT CONTRAST TECHNIQUE: Multidetector CT imaging of the chest was performed following the standard protocol without IV contrast. COMPARISON:  CT chest 11/05/2019 FINDINGS: Cardiovascular: The heart is not enlarged. There is no pericardial effusion. There are dense mitral annular calcifications, mild aortic valve calcifications, three-vessel coronary artery disease and calcified atherosclerotic plaque throughout the thoracic aorta. Mediastinum/Nodes: The thyroid is unremarkable. The esophagus is grossly unremarkable. There is a small hiatal hernia. There is no mediastinal or axillary lymphadenopathy. There is no bulky hilar adenopathy. Lungs/Pleura: The trachea and central airways are patent. Moderate centrilobular and paraseptal emphysema is again seen with mild central bronchial wall thickening. Again seen is peripheral fibrotic change with areas of honeycombing more predominant in the lung bases. The  findings are not significantly progressed since 11/05/2019. Patchy opacities in the lateral left base and lingula likely reflect areas of scarring or atelectasis. There is no focal consolidation or pulmonary edema. There is no pleural effusion or pneumothorax. Upper Abdomen: Contrast is seen in the right collecting system from a prior study. There are no acute findings in the imaged upper abdomen. Musculoskeletal: There acute nondisplaced fractures of the left sixth and seventh ribs anterolaterally. IMPRESSION: 1. Acute nondisplaced fractures of the left anterolateral sixth and seventh ribs. 2. Basal prominent fibrotic change in both lungs, overall not significantly progressed since 2021. Additional patchy opacities in the lateral left base and lingula likely reflect additional areas of scar or atelectasis. 3. Dense mitral annular calcifications, aortic valve calcifications, and three-vessel coronary artery calcification. 4. Aortic Atherosclerosis (ICD10-I70.0) and Emphysema (ICD10-J43.9). Electronically Signed   By: Valetta Mole M.D.   On: 03/02/2021 16:32   CT CERVICAL SPINE WO CONTRAST  Result Date: 03/02/2021 CLINICAL DATA:  Head trauma, minor (Age >= 65y); Neck trauma (Age >= 65y) EXAM: CT HEAD WITHOUT CONTRAST CT CERVICAL SPINE WITHOUT CONTRAST TECHNIQUE: Multidetector CT imaging of the head and cervical spine was performed following the standard protocol without intravenous contrast. Multiplanar CT image reconstructions of the cervical spine were also generated. COMPARISON:  06/11/2020 FINDINGS: CT HEAD FINDINGS Brain: Large area of decreased attenuation with loss of gray-white differentiation predominantly involving the right parietal lobe suspicious for acute infarction. No evidence of intracranial hemorrhage. No extra-axial collection. Prior left basal ganglia lacunar infarct. Scattered low-density changes within the periventricular and subcortical white matter compatible with chronic microvascular  ischemic change. Mild diffuse cerebral volume loss. Vascular: Atherosclerotic calcifications involving the large vessels of the skull base. 7 mm partially peripherally calcified structure in the region of the proximal right MCA, possibly reflecting a small partially calcified aneurysm sac. No unexpected hyperdense vessel. Skull: Normal. Negative for fracture or focal lesion. Sinuses/Orbits: No acute finding. Other: None. CT CERVICAL SPINE FINDINGS Alignment: Facet joints are aligned without dislocation or traumatic listhesis. Dens and lateral masses are aligned. Straightening of the cervical lordosis. Skull base and vertebrae: No acute fracture. No primary bone lesion or focal pathologic process. Soft tissues and spinal canal: No prevertebral fluid or swelling. No visible canal hematoma. Disc levels: Degenerative disc disease most pronounced at C5-6 and C6-7. Multilevel bilateral facet arthropathy. Upper chest: Negative. Other: Medial course of the bilateral common carotid arteries. Carotid atherosclerosis. IMPRESSION: 1. Findings suggestive of acute right MCA territory  infarction. Further evaluation with MRI of the brain is recommended. 2. Incidentally noted 7 mm partially peripherally calcified structure in the region of the proximal right MCA, suspicious for a small partially calcified aneurysm sac. Attention on follow-up vascular imaging. 3. No acute fracture or traumatic listhesis of the cervical spine. 4. Degenerative disc disease and facet arthropathy of the cervical spine, most pronounced at C5-6 and C6-7. These results were called by telephone at the time of interpretation on 03/02/2021 at 1:35 pm to provider JULIE HAVILAND , who verbally acknowledged these results. Electronically Signed   By: Davina Poke D.O.   On: 03/02/2021 13:35   DG Chest Port 1 View  Result Date: 03/02/2021 CLINICAL DATA:  Short of breath and chest pain EXAM: PORTABLE CHEST 1 VIEW COMPARISON:  06/11/2020 FINDINGS: Cardiac  enlargement without heart failure. Atherosclerotic calcification aortic arch. Mild bibasilar atelectasis similar to prior study. No significant pleural effusion. IMPRESSION: Mild bibasilar atelectasis. Negative for heart failure. Underlying COPD. Electronically Signed   By: Franchot Gallo M.D.   On: 03/02/2021 12:46   CT ANGIO HEAD NECK W WO CM W PERF (CODE STROKE)  Result Date: 03/02/2021 CLINICAL DATA:  Stroke, follow up EXAM: CT ANGIOGRAPHY HEAD AND NECK CT PERFUSION BRAIN TECHNIQUE: Multidetector CT imaging of the head and neck was performed using the standard protocol during bolus administration of intravenous contrast. Multiplanar CT image reconstructions and MIPs were obtained to evaluate the vascular anatomy. Carotid stenosis measurements (when applicable) are obtained utilizing NASCET criteria, using the distal internal carotid diameter as the denominator. Multiphase CT imaging of the brain was performed following IV bolus contrast injection. Subsequent parametric perfusion maps were calculated using RAPID software. CONTRAST:  163mL OMNIPAQUE IOHEXOL 350 MG/ML SOLN COMPARISON:  None. FINDINGS: CTA NECK FINDINGS Aorta: Calcific atherosclerosis.  Great vessel origins are patent. Right carotid system: Common carotid artery is patent. Calcific atherosclerosis at the carotid bifurcation with approximately 40% stenosis. 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 Mild dilation in this region, compatible with small pseudoaneurysm. Both the false and true lumens are patent. Retropharyngeal course. Left carotid system: Carotid bifurcation atherosclerosis without greater than 50% stenosis. Mild irregularity of the ICA at the skull base, probably atherosclerotic given no intimal flap to suggest dissection. The ICAs tortuous at the skull base. Retropharyngeal course. Vertebral arteries: Mildly  right dominant. Patent. Mild atherosclerotic irregularity bilaterally. Skeleton: Multilevel degenerative change of the cervical spine. Other neck: No acute abnormality. Upper chest: Emphysema. No consolidation visualized lung apices. Biapical pleuroparenchymal scarring. Review of the MIP images confirms the above findings CTA HEAD FINDINGS Anterior circulation: Bilateral intracranial ICAs are patent with mild atherosclerotic narrowing. Bilateral M1 MCAs are patent. Occlusion of the proximal right M2 MCA. 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 Motion limited evaluation of the ACAs and distal MCAs. The ACAs appear to be grossly patent. Posterior circulation: Dominant right intradural vertebral artery. Bilateral intradural vertebral artery, basilar artery, and posterior cerebral arteries are patent without proximal high-grade stenosis. Prominent right posterior communicating artery with small right P1 PCA, anatomic variant. Motion limited evaluation of the PCAs. Venous sinuses: As permitted by contrast timing, patent. Anatomic variants: Detailed above. CT Brain Perfusion Findings: CBF (<30%) Volume: 32mL Perfusion (Tmax>6.0s) volume: 51mL. Possibly overestimated given probably artifactual T-max greater than 6 seconds in the left anterior and middle cranial fossa. Mismatch Volume: 64mL Infarction  Location:Posterior right MCA territory. IMPRESSION: 1. Acute right M2 MCA occlusion. 2. 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 3. On CT perfusion, largely completed right distal MCA territory infarct with 55 mL core infarct and 11 mL penumbra. Penumbra may be overestimated given probably artifactual T-max greater than 6 seconds in the left anterior and anterior middle cranial fossa. 4. 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 5. Bilateral  carotid bifurcation atherosclerosis with approximately 40% stenosis of the proximal right ICA. 6. Aortic Atherosclerosis (ICD10-I70.0) and Emphysema (ICD10-J43.9). LVO, perfusion findings, and aneurysm discussed with Dr. Rory Percy 2:25 PM via telephone. Dissection discussed at 2:35 p.m. via telephone. Electronically Signed   By: Margaretha Sheffield M.D.   On: 03/02/2021 14:57    EKG: Independently reviewed.  Sinus tachycardia.  PVCs.  No acute ST changes.  Assessment/Plan: Principal Problem:   Acute ischemic right MCA stroke (HCC) Active Problems:   COPD mixed type (HCC)   Rheumatoid arthritis ([place])   Acute lower UTI   Essential hypertension   OSA on CPAP   Dissection of right carotid artery ([place])   Rib fractures    This patient was discussed with the ED physician, including pertinent vitals, physical exam findings, labs, and imaging.  We also discussed care given by the ED provider.  Acute right MCA stroke Observation on telemetry Echocardiogram tomorrow Hemoglobin A1c, lipid panel in the morning PT/OT/speech therapy consult Full aspirin and plavix. High dose statin. Permissive HTN Right carotid artery dissection Currently stable Will need neuro IR consult Anterior rib fx Pain control UTI Urine Cx Rocephin Hypertension Permissive HTN RA On plaquinel OSA on CPAP CPAP at night COPD Continue inhalers Stable   DVT prophylaxis: SCDs Consultants: neuro Code Status: DNR Family Communication: son present during interview  Disposition Plan: pending   Truett Mainland, DO

## 2021-03-02 NOTE — ED Provider Notes (Signed)
[facility] EMERGENCY DEPARTMENT Provider Note   CSN: [id number] Arrival date & time: 03/02/21  1126  An emergency department physician performed an initial assessment on this suspected stroke patient at 51.  History Chief Complaint  Patient presents with   [name] is a 80 y.o. female.  Pt presents to the ED today with a fall.  The pt remembers going to sleep around 2200.  She does not remember what happened this morning.  Pt's son said he went by her house around 0930 and saw that her shades were open and they had been closed last night.  She lives alone and he has no idea when she would have opened them up.  He was trying to get ahold of pt and she was not answering the phone.  The neighbor heard pt yelling and he and the neighbor were able to get into the house.  They found her on the ground.  She is supposed to be on oxygen (3L) 24 hours a day.  Her son does not think she was on it when she had fallen, but this is unclear.  Pt c/o sob.  She also hit her left lower leg and hit her lip.      Past Medical History:  Diagnosis Date   Allergic rhinitis    Asthma    Asthmatic bronchitis    Colon polyps    COPD (chronic obstructive pulmonary disease) (HCC)    Diverticulosis    GERD (gastroesophageal reflux disease)    H/O: GI bleed    Hypercholesterolemia    Migraine headache    Obesity    RLS (restless legs syndrome)     Patient Active Problem List   Diagnosis Date Noted   Acute ischemic right MCA stroke ([place]) 03/02/2021   Dissection of right carotid artery ([place]) 03/02/2021   OSA on CPAP 08/26/2020   Essential hypertension 06/20/2020   H/O TB skin testing 06/20/2020   Acute lower UTI 06/12/2020   COVID-19 06/11/2020   Chronic respiratory failure with hypoxia ([place]) 06/01/2019   COPD mixed type ([place]) 03/14/2015   Rheumatoid arthritis ([place]) 03/14/2015   Dyspnea 02/06/2015   ILD (interstitial lung disease) ([place]) 02/06/2015   Cough 05/17/2011    Past  Surgical History:  Procedure Laterality Date   TONSILLECTOMY  x2   TOTAL ABDOMINAL HYSTERECTOMY  02/17/1989     OB History   No obstetric history on file.     Family History  Problem Relation Age of Onset   Allergies Mother    Asthma Mother    Breast cancer Mother    Allergies Father     Social History   Tobacco Use   Smoking status: Former    Packs/day: 1.00    Years: 46.00    Pack years: 46.00    Types: Cigarettes    Start date: 28    Quit date: 04/15/2001    Years since quitting: 19.8   Smokeless tobacco: Never  Vaping Use   Vaping Use: Never used  Substance Use Topics   Alcohol use: No   Drug use: No    Home Medications Prior to Admission medications   Medication Sig Start Date End Date Taking? Authorizing Provider  acetaminophen (TYLENOL) 500 MG tablet Take 1,000 mg by mouth every 6 (six) hours as needed for moderate pain.    [provider]  alendronate (FOSAMAX) 70 MG tablet Take 1 tablet (70 mg total) by mouth once a week. 06/23/20  Medina-Vargas, Monina C, NP  Ascorbic Acid (VITAMIN C) 1000 MG tablet Take 1 tablet (1,000 mg total) by mouth daily. 06/23/20   Medina-Vargas, Monina C, NP  aspirin 81 MG tablet Take 81 mg by mouth every other day.    [provider]  bisacodyl (DULCOLAX) 10 MG suppository Place 10 mg rectally as needed for moderate constipation.    [provider]  Calcium Carbonate-Vitamin D 600-200 MG-UNIT TABS Take 1 tablet by mouth daily. 06/23/20   Medina-Vargas, Monina C, NP  Fluticasone-Umeclidin-Vilant (TRELEGY ELLIPTA) 200-62.5-25 MCG/INH AEPB Inhale 1 puff into the lungs daily. 06/23/20   Medina-Vargas, Monina C, NP  guaiFENesin-dextromethorphan (ROBITUSSIN DM) 100-10 MG/5ML syrup Take 10 mLs by mouth every 4 (four) hours as needed for cough. 06/23/20   Medina-Vargas, Monina C, NP  hydroxychloroquine (PLAQUENIL) 200 MG tablet Take 1 tablet (200 mg total) by mouth 2 (two) times daily. 06/23/20   Medina-Vargas, Monina  C, NP  leflunomide (ARAVA) 20 MG tablet TAKE ONE TABLET BY MOUTH EVERY MORNING 09/21/20   Gerlene Fee, NP  levalbuterol [facility] HFA) 45 MCG/ACT inhaler Inhale 1 puff into the lungs 3 (three) times daily. 06/23/20   Medina-Vargas, Monina C, NP  lisinopril (ZESTRIL) 5 MG tablet TAKE ONE TABLET BY MOUTH EVERY MORNING 09/21/20   Gerlene Fee, NP  loratadine (CLARITIN) 10 MG tablet Take 10 mg by mouth daily.    [provider]  magnesium hydroxide (MILK OF MAGNESIA) 400 MG/5ML suspension Take by mouth daily as needed for mild constipation.    [provider]  omeprazole (PRILOSEC) 40 MG capsule Take 1 capsule (40 mg total) by mouth daily. 06/23/20   Medina-Vargas, Monina C, NP  OXYGEN Inhale into the lungs. 2 lpm with rest and exertion    [provider]  predniSONE (DELTASONE) 5 MG tablet Take 1.5 tablets (7.5 mg total) by mouth daily with breakfast. For COPD 06/27/20   Medina-Vargas, Monina C, NP  rOPINIRole (REQUIP) 0.25 MG tablet Take 1 tablet (0.25 mg total) by mouth 3 (three) times daily. Patient taking differently: Take 1 mg by mouth once. 06/23/20   Medina-Vargas, Monina C, NP  rosuvastatin (CRESTOR) 5 MG tablet Take 1 tablet (5 mg total) by mouth every morning. 06/23/20   Medina-Vargas, Monina C, NP  Sodium Phosphates (RA SALINE ENEMA RE) Place rectally as needed.    [provider]  traMADol (ULTRAM) 50 MG tablet Take 1 tablet (50 mg total) by mouth every 8 (eight) hours as needed for moderate pain. 06/23/20   Medina-Vargas, Monina C, NP  zinc sulfate 220 (50 Zn) MG capsule Take 1 capsule (220 mg total) by mouth daily. 06/23/20   Medina-Vargas, Monina C, NP    Allergies    Shellfish allergy, Atorvastatin, Sertraline hcl, Macrolides and ketolides, Penicillins, and Sulfa antibiotics  Review of Systems   Review of Systems  Respiratory:  Positive for shortness of breath.   Musculoskeletal:        Left leg pain  All other systems reviewed and are  negative.  Physical Exam Updated Vital Signs BP (!) 154/34   Pulse (!) 108   Temp 97.7 F (36.5 C)   Resp 17   SpO2 99%   Physical Exam Vitals and nursing note reviewed.  Constitutional:      Appearance: She is obese.  HENT:     Head: Normocephalic and atraumatic.     Comments: Bruise to left lower lip.  No lac.    Right Ear: External ear normal.  Left Ear: External ear normal.     Nose: Nose normal.     Mouth/Throat:     Mouth: Mucous membranes are dry.  Eyes:     Extraocular Movements: Extraocular movements intact.     Pupils: Pupils are equal, round, and reactive to light.  Cardiovascular:     Rate and Rhythm: Regular rhythm. Tachycardia present.     Pulses: Normal pulses.     Heart sounds: Normal heart sounds.  Pulmonary:     Effort: Pulmonary effort is normal.     Breath sounds: Normal breath sounds.  Abdominal:     General: Abdomen is flat. Bowel sounds are normal.     Palpations: Abdomen is soft.  Musculoskeletal:        General: Normal range of motion.     Cervical back: Normal range of motion and neck supple.  Skin:    General: Skin is warm.     Capillary Refill: Capillary refill takes less than 2 seconds.     Comments: Large abrasion/skin tear/lac to left lower leg  Neurological:     Mental Status: She is alert.     Comments: Left leg moving, but not as much as right  Psychiatric:        Mood and Affect: Mood is anxious.    ED Results / Procedures / Treatments   Labs (all labs ordered are listed, but only abnormal results are displayed) Labs Reviewed  CBC WITH DIFFERENTIAL/PLATELET - Abnormal; Notable for the following components:      Result Value   WBC 15.8 (*)    RBC 3.78 (*)    MCV 101.6 (*)    Platelets 135 (*)    Neutro Abs 11.3 (*)    Monocytes Absolute 2.0 (*)    Abs Immature Granulocytes 0.10 (*)    All other components within normal limits  COMPREHENSIVE METABOLIC PANEL - Abnormal; Notable for the following components:   Glucose,  Bld 142 (*)    Calcium 8.5 (*)    All other components within normal limits  URINALYSIS, ROUTINE W REFLEX MICROSCOPIC - Abnormal; Notable for the following components:   APPearance CLOUDY (*)    Hgb urine dipstick SMALL (*)    Protein, ur 30 (*)    Nitrite POSITIVE (*)    Leukocytes,Ua LARGE (*)    WBC, UA >50 (*)    Bacteria, UA MANY (*)    All other components within normal limits  CBG MONITORING, ED - Abnormal; Notable for the following components:   Glucose-Capillary 141 (*)    All other components within normal limits  TROPONIN I (HIGH SENSITIVITY) - Abnormal; Notable for the following components:   Troponin I (High Sensitivity) 19 (*)    All other components within normal limits  TROPONIN I (HIGH SENSITIVITY) - Abnormal; Notable for the following components:   Troponin I (High Sensitivity) 22 (*)    All other components within normal limits  RESP PANEL BY RT-PCR (FLU A&B, COVID) [id number]  PROTIME-INR    EKG EKG Interpretation  Date/Time:  Friday March 02 2021 11:54:17 EST Ventricular Rate:  112 PR Interval:  191 QRS Duration: 90 QT Interval:  327 QTC Calculation: 437 R Axis:   60 Text Interpretation: Sinus tachycardia Multiple premature complexes, vent & supraven Nonspecific repol abnormality, diffuse leads Since last tracing rate faster Confirmed by Isla Pence [phone]) on 03/02/2021 12:37:19 PM  Radiology CT HEAD WO CONTRAST  Result Date: 03/02/2021 CLINICAL DATA:  Head trauma, minor (Age >= 65y);  Neck trauma (Age >= 65y) EXAM: CT HEAD WITHOUT CONTRAST CT CERVICAL SPINE WITHOUT CONTRAST TECHNIQUE: Multidetector CT imaging of the head and cervical spine was performed following the standard protocol without intravenous contrast. Multiplanar CT image reconstructions of the cervical spine were also generated. COMPARISON:  06/11/2020 FINDINGS: CT HEAD FINDINGS Brain: Large area of decreased attenuation with loss of gray-white differentiation predominantly involving the  right parietal lobe suspicious for acute infarction. No evidence of intracranial hemorrhage. No extra-axial collection. Prior left basal ganglia lacunar infarct. Scattered low-density changes within the periventricular and subcortical white matter compatible with chronic microvascular ischemic change. Mild diffuse cerebral volume loss. Vascular: Atherosclerotic calcifications involving the large vessels of the skull base. 7 mm partially peripherally calcified structure in the region of the proximal right MCA, possibly reflecting a small partially calcified aneurysm sac. No unexpected hyperdense vessel. Skull: Normal. Negative for fracture or focal lesion. Sinuses/Orbits: No acute finding. Other: None. CT CERVICAL SPINE FINDINGS Alignment: Facet joints are aligned without dislocation or traumatic listhesis. Dens and lateral masses are aligned. Straightening of the cervical lordosis. Skull base and vertebrae: No acute fracture. No primary bone lesion or focal pathologic process. Soft tissues and spinal canal: No prevertebral fluid or swelling. No visible canal hematoma. Disc levels: Degenerative disc disease most pronounced at C5-6 and C6-7. Multilevel bilateral facet arthropathy. Upper chest: Negative. Other: Medial course of the bilateral common carotid arteries. Carotid atherosclerosis. IMPRESSION: 1. Findings suggestive of acute right MCA territory infarction. Further evaluation with MRI of the brain is recommended. 2. Incidentally noted 7 mm partially peripherally calcified structure in the region of the proximal right MCA, suspicious for a small partially calcified aneurysm sac. Attention on follow-up vascular imaging. 3. No acute fracture or traumatic listhesis of the cervical spine. 4. Degenerative disc disease and facet arthropathy of the cervical spine, most pronounced at C5-6 and C6-7. These results were called by telephone at the time of interpretation on 03/02/2021 at 1:35 pm to provider Zohar Laing ,  who verbally acknowledged these results. Electronically Signed   By: Davina Poke D.O.   On: 03/02/2021 13:35   CT CERVICAL SPINE WO CONTRAST  Result Date: 03/02/2021 CLINICAL DATA:  Head trauma, minor (Age >= 65y); Neck trauma (Age >= 65y) EXAM: CT HEAD WITHOUT CONTRAST CT CERVICAL SPINE WITHOUT CONTRAST TECHNIQUE: Multidetector CT imaging of the head and cervical spine was performed following the standard protocol without intravenous contrast. Multiplanar CT image reconstructions of the cervical spine were also generated. COMPARISON:  06/11/2020 FINDINGS: CT HEAD FINDINGS Brain: Large area of decreased attenuation with loss of gray-white differentiation predominantly involving the right parietal lobe suspicious for acute infarction. No evidence of intracranial hemorrhage. No extra-axial collection. Prior left basal ganglia lacunar infarct. Scattered low-density changes within the periventricular and subcortical white matter compatible with chronic microvascular ischemic change. Mild diffuse cerebral volume loss. Vascular: Atherosclerotic calcifications involving the large vessels of the skull base. 7 mm partially peripherally calcified structure in the region of the proximal right MCA, possibly reflecting a small partially calcified aneurysm sac. No unexpected hyperdense vessel. Skull: Normal. Negative for fracture or focal lesion. Sinuses/Orbits: No acute finding. Other: None. CT CERVICAL SPINE FINDINGS Alignment: Facet joints are aligned without dislocation or traumatic listhesis. Dens and lateral masses are aligned. Straightening of the cervical lordosis. Skull base and vertebrae: No acute fracture. No primary bone lesion or focal pathologic process. Soft tissues and spinal canal: No prevertebral fluid or swelling. No visible canal hematoma. Disc levels: Degenerative disc disease most  pronounced at C5-6 and C6-7. Multilevel bilateral facet arthropathy. Upper chest: Negative. Other: Medial course of the  bilateral common carotid arteries. Carotid atherosclerosis. IMPRESSION: 1. Findings suggestive of acute right MCA territory infarction. Further evaluation with MRI of the brain is recommended. 2. Incidentally noted 7 mm partially peripherally calcified structure in the region of the proximal right MCA, suspicious for a small partially calcified aneurysm sac. Attention on follow-up vascular imaging. 3. No acute fracture or traumatic listhesis of the cervical spine. 4. Degenerative disc disease and facet arthropathy of the cervical spine, most pronounced at C5-6 and C6-7. These results were called by telephone at the time of interpretation on 03/02/2021 at 1:35 pm to provider Dalton Mille , who verbally acknowledged these results. Electronically Signed   By: Davina Poke D.O.   On: 03/02/2021 13:35   DG Chest Port 1 View  Result Date: 03/02/2021 CLINICAL DATA:  Short of breath and chest pain EXAM: PORTABLE CHEST 1 VIEW COMPARISON:  06/11/2020 FINDINGS: Cardiac enlargement without heart failure. Atherosclerotic calcification aortic arch. Mild bibasilar atelectasis similar to prior study. No significant pleural effusion. IMPRESSION: Mild bibasilar atelectasis. Negative for heart failure. Underlying COPD. Electronically Signed   By: Franchot Gallo M.D.   On: 03/02/2021 12:46   CT ANGIO HEAD NECK W WO CM W PERF (CODE STROKE)  Result Date: 03/02/2021 CLINICAL DATA:  Stroke, follow up EXAM: CT ANGIOGRAPHY HEAD AND NECK CT PERFUSION BRAIN TECHNIQUE: Multidetector CT imaging of the head and neck was performed using the standard protocol during bolus administration of intravenous contrast. Multiplanar CT image reconstructions and MIPs were obtained to evaluate the vascular anatomy. Carotid stenosis measurements (when applicable) are obtained utilizing NASCET criteria, using the distal internal carotid diameter as the denominator. Multiphase CT imaging of the brain was performed following IV bolus contrast  injection. Subsequent parametric perfusion maps were calculated using RAPID software. CONTRAST:  168mL OMNIPAQUE IOHEXOL 350 MG/ML SOLN COMPARISON:  None. FINDINGS: CTA NECK FINDINGS Aorta: Calcific atherosclerosis.  Great vessel origins are patent. Right carotid system: Common carotid artery is patent. Calcific atherosclerosis at the carotid bifurcation with approximately 40% stenosis. 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 Mild dilation in this region, compatible with small pseudoaneurysm. Both the false and true lumens are patent. Retropharyngeal course. Left carotid system: Carotid bifurcation atherosclerosis without greater than 50% stenosis. Mild irregularity of the ICA at the skull base, probably atherosclerotic given no intimal flap to suggest dissection. The ICAs tortuous at the skull base. Retropharyngeal course. Vertebral arteries: Mildly right dominant. Patent. Mild atherosclerotic irregularity bilaterally. Skeleton: Multilevel degenerative change of the cervical spine. Other neck: No acute abnormality. Upper chest: Emphysema. No consolidation visualized lung apices. Biapical pleuroparenchymal scarring. Review of the MIP images confirms the above findings CTA HEAD FINDINGS Anterior circulation: Bilateral intracranial ICAs are patent with mild atherosclerotic narrowing. Bilateral M1 MCAs are patent. Occlusion of the proximal right M2 MCA. 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 Motion limited evaluation of the ACAs and distal MCAs. The ACAs appear to be grossly patent. Posterior circulation: Dominant right intradural vertebral artery. Bilateral intradural vertebral artery, basilar artery, and posterior cerebral arteries are patent without proximal high-grade stenosis. Prominent right posterior communicating artery with small  right P1 PCA, anatomic variant. Motion limited evaluation of the PCAs. Venous sinuses: As permitted by contrast timing, patent. Anatomic variants: Detailed above. CT  Brain Perfusion Findings: CBF (<30%) Volume: 82mL Perfusion (Tmax>6.0s) volume: 69mL. Possibly overestimated given probably artifactual T-max greater than 6 seconds in the left anterior and middle cranial fossa. Mismatch Volume: 48mL Infarction Location:Posterior right MCA territory. IMPRESSION: 1. Acute right M2 MCA occlusion. 2. 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 3. On CT perfusion, largely completed right distal MCA territory infarct with 55 mL core infarct and 11 mL penumbra. Penumbra may be overestimated given probably artifactual T-max greater than 6 seconds in the left anterior and anterior middle cranial fossa. 4. 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 5. Bilateral carotid bifurcation atherosclerosis with approximately 40% stenosis of the proximal right ICA. 6. Aortic Atherosclerosis (ICD10-I70.0) and Emphysema (ICD10-J43.9). LVO, perfusion findings, and aneurysm discussed with Dr. Rory Percy 2:25 PM via telephone. Dissection discussed at 2:35 p.m. via telephone. Electronically Signed   By: Margaretha Sheffield M.D.   On: 03/02/2021 14:57    Procedures .Marland KitchenLaceration Repair  Date/Time: 03/02/2021 4:14 PM Performed by: Isla Pence, MD Authorized by: Isla Pence, MD   Consent:    Consent obtained:  Verbal   Consent given by:  Patient Universal protocol:    Patient identity confirmed:  Verbally with patient Anesthesia:    Anesthesia method:  Local infiltration   Local anesthetic:  Lidocaine 1% WITH epi Laceration details:    Location:  Leg   Length (cm):  1 Pre-procedure details:    Preparation:  Patient was prepped and draped in usual sterile fashion Exploration:    Hemostasis achieved with:   Epinephrine   Contaminated: no   Treatment:    Area cleansed with:  Saline   Amount of cleaning:  Extensive   Irrigation solution:  Sterile saline Skin repair:    Repair method:  Sutures   Suture size:  4-0   Suture material:  Prolene   Suture technique:  Simple interrupted   Number of sutures:  2 Approximation:    Approximation:  Close Repair type:    Repair type:  Simple Post-procedure details:    Dressing:  Antibiotic ointment   Procedure completion:  Tolerated well, no immediate complications   Medications Ordered in ED Medications  sodium chloride 0.9 % bolus 1,000 mL (0 mLs Intravenous Stopped 03/02/21 1458)    And  0.9 %  sodium chloride infusion ( Intravenous New Bag/Given 03/02/21 1458)  aspirin EC tablet 81 mg (has no administration in time range)  clopidogrel (PLAVIX) tablet 75 mg (has no administration in time range)  bacitracin ointment (has no administration in time range)  lidocaine-EPINEPHrine (XYLOCAINE W/EPI) 2 %-1:200000 (PF) injection 10 mL (10 mLs Infiltration Given by Other 03/02/21 1550)  ondansetron (ZOFRAN) injection 4 mg (4 mg Intravenous Given 03/02/21 1248)  LORazepam (ATIVAN) injection 1 mg (1 mg Intravenous Given 03/02/21 1351)  ipratropium-albuterol (DUONEB) 0.5-2.5 (3) MG/3ML nebulizer solution 3 mL (3 mLs Nebulization Given 03/02/21 1438)  iohexol (OMNIPAQUE) 350 MG/ML injection 100 mL (100 mLs Intravenous Contrast Given 03/02/21 1358)  morphine 4 MG/ML injection 4 mg (4 mg Intravenous Given 03/02/21 1459)    ED Course  I have reviewed the triage vital signs and the nursing notes.  Pertinent labs & imaging results that were available during my care of the patient were reviewed by me and considered in my medical decision making (see chart for details).    MDM Rules/Calculators/A&P  CT head done as part of trauma from fall.  It does show a large stroke.  I thought she was not moving her left leg well because of  pain from the injury.  After this finding, I called a code stroke because the last seen normal times were unclear.  Pt d/w Dr. Rory Percy (neurology).  Pt also has a dissection of the right ica.  They feel that thrombectomy is too risky.  They request admission to [facility].  Pt d/w Dr. Nehemiah Settle (triad) who will admit.  CRITICAL CARE Performed by: Isla Pence   Total critical care time: 45 minutes  Critical care time was exclusive of separately billable procedures and treating other patients.  Critical care was necessary to treat or prevent imminent or life-threatening deterioration.  Critical care was time spent personally by me on the following activities: development of treatment plan with patient and/or surrogate as well as nursing, discussions with consultants, evaluation of patient's response to treatment, examination of patient, obtaining history from patient or surrogate, ordering and performing treatments and interventions, ordering and review of laboratory studies, ordering and review of radiographic studies, pulse oximetry and re-evaluation of patient's condition.    Final Clinical Impression(s) / ED Diagnoses Final diagnoses:  Cerebrovascular accident (CVA), unspecified mechanism ([place])    Rx / [place] Orders ED Discharge Orders     None        Isla Pence, MD 03/02/21 1617

## 2021-03-02 NOTE — Consult Note (Signed)
[facility] Telemedicine Consult   Requesting Provider: Dr. Gilford Raid Consult Participants: Dr. Jerelyn Charles, Telespecialist RN Kimmie    bedside RN at [facility], ER Location of the provider: [facility] Location of the patient: [facility] emergency room by 12  This consult was provided via telemedicine with 2-way video and audio communication. The patient/family was informed that care would be provided in this way and agreed to receive care in this manner.    Chief Complaint: Left-sided weakness, fall  HPI: 80 year old past medical history of COPD, GERD, hypercholesterolemia, migraine, presented to the emergency room and son found her down on the floor.  Unclear last known well but to the best of her ability last known well somewhere around 10 PM yesterday when she went to bed.  This morning she was found on the floor by son.  She does not remember when she got up or what she did after getting up.  She could not move the left side of her body but it was thought that she fell on that side and could not move.  Noncontrast head CT and a CT of the neck were done on arrival and the noncontrasted head CT showed a evolving right MCA territory infarct after which a code stroke was activated. She was seen emergently by telemedicine consultation by me and I recommended a CTA head and neck to be done. The CT angiography head and neck showed a proximal right M2 occlusion with a CT perfusion study showing a penumbra with only a mismatch ratio of 1.2 not amenable to endovascular intervention. Interestingly also seen in the neck was possible right ICA dissection at the skull base.  I suspect that she probably had a dissection due to the fall leading to further embolization causing the right MCA stroke.  Past Medical History:  Diagnosis Date   Allergic rhinitis    Asthma    Asthmatic bronchitis    Colon polyps    COPD (chronic obstructive pulmonary disease) (HCC)    Diverticulosis    GERD  (gastroesophageal reflux disease)    H/O: GI bleed    Hypercholesterolemia    Migraine headache    Obesity    RLS (restless legs syndrome)     Current Facility-Administered Medications:    [COMPLETED] sodium chloride 0.9 % bolus 1,000 mL, 1,000 mL, Intravenous, Once, Last Rate: 999 mL/hr at 03/02/21 1208, 1,000 mL at 03/02/21 1208 **AND** 0.9 %  sodium chloride infusion, , Intravenous, Continuous, Isla Pence, MD   ipratropium-albuterol (DUONEB) 0.5-2.5 (3) MG/3ML nebulizer solution 3 mL, 3 mL, Nebulization, Once, Isla Pence, MD   lidocaine-EPINEPHrine (XYLOCAINE W/EPI) 2 %-1:200000 (PF) injection 10 mL, 10 mL, Infiltration, Once, Isla Pence, MD   LORazepam (ATIVAN) injection 1 mg, 1 mg, Intravenous, Once, Isla Pence, MD  Current Outpatient Medications:    acetaminophen (TYLENOL) 500 MG tablet, Take 1,000 mg by mouth every 6 (six) hours as needed for moderate pain., Disp: , Rfl:    alendronate (FOSAMAX) 70 MG tablet, Take 1 tablet (70 mg total) by mouth once a week., Disp: 4 tablet, Rfl: 0   Ascorbic Acid (VITAMIN C) 1000 MG tablet, Take 1 tablet (1,000 mg total) by mouth daily., Disp: 30 tablet, Rfl: 0   aspirin 81 MG tablet, Take 81 mg by mouth every other day., Disp: , Rfl:    bisacodyl (DULCOLAX) 10 MG suppository, Place 10 mg rectally as needed for moderate constipation., Disp: , Rfl:    Calcium Carbonate-Vitamin D 600-200 MG-UNIT TABS, Take 1  tablet by mouth daily., Disp: 30 tablet, Rfl: 0   Fluticasone-Umeclidin-Vilant (TRELEGY ELLIPTA) 200-62.5-25 MCG/INH AEPB, Inhale 1 puff into the lungs daily., Disp: 28 each, Rfl: 0   guaiFENesin-dextromethorphan (ROBITUSSIN DM) 100-10 MG/5ML syrup, Take 10 mLs by mouth every 4 (four) hours as needed for cough., Disp: 118 mL, Rfl: 0   hydroxychloroquine (PLAQUENIL) 200 MG tablet, Take 1 tablet (200 mg total) by mouth 2 (two) times daily., Disp: 60 tablet, Rfl: 0   leflunomide (ARAVA) 20 MG tablet, TAKE ONE TABLET BY MOUTH EVERY  MORNING, Disp: 30 tablet, Rfl: 0   levalbuterol (XOPENEX HFA) 45 MCG/ACT inhaler, Inhale 1 puff into the lungs 3 (three) times daily., Disp: 15 g, Rfl: 0   lisinopril (ZESTRIL) 5 MG tablet, TAKE ONE TABLET BY MOUTH EVERY MORNING, Disp: 30 tablet, Rfl: 0   loratadine (CLARITIN) 10 MG tablet, Take 10 mg by mouth daily., Disp: , Rfl:    magnesium hydroxide (MILK OF MAGNESIA) 400 MG/5ML suspension, Take by mouth daily as needed for mild constipation., Disp: , Rfl:    omeprazole (PRILOSEC) 40 MG capsule, Take 1 capsule (40 mg total) by mouth daily., Disp: 30 capsule, Rfl: 0   OXYGEN, Inhale into the lungs. 2 lpm with rest and exertion, Disp: , Rfl:    predniSONE (DELTASONE) 5 MG tablet, Take 1.5 tablets (7.5 mg total) by mouth daily with breakfast. For COPD, Disp: 24 tablet, Rfl: 0   rOPINIRole (REQUIP) 0.25 MG tablet, Take 1 tablet (0.25 mg total) by mouth 3 (three) times daily. (Patient taking differently: Take 1 mg by mouth once.), Disp: 30 tablet, Rfl: 0   rosuvastatin (CRESTOR) 5 MG tablet, Take 1 tablet (5 mg total) by mouth every morning., Disp: 30 tablet, Rfl: 0   Sodium Phosphates (RA SALINE ENEMA RE), Place rectally as needed., Disp: , Rfl:    traMADol (ULTRAM) 50 MG tablet, Take 1 tablet (50 mg total) by mouth every 8 (eight) hours as needed for moderate pain., Disp: 30 tablet, Rfl: 0   zinc sulfate 220 (50 Zn) MG capsule, Take 1 capsule (220 mg total) by mouth daily., Disp: 30 capsule, Rfl: 0    LKW: 10 PM on 03/01/2021 tpa given?: No, outside the window IR Thrombectomy? No, unfavorable perfusion profile. Modified Rankin Scale: 0-Completely asymptomatic and back to baseline post- stroke-according to the son lives alone, takes care of her finances and personal ADLs independently Time of teleneurologist evaluation: 1340 hrs.  Exam: Vitals:   03/02/21 1150 03/02/21 1230  BP: (!) 154/101 (!) 183/73  Pulse: (!) 50 (!) 112  Resp: 13 (!) 22  Temp: 97.7 F (36.5 C)   SpO2: 100% 100%     General: Awake alert in no distress Neurological exam She is awake alert oriented to self, oriented to the fact that she is in the hospital but could not tell the correct month or her age. Speech is mildly dysarthric No evidence of aphasia Mild right gaze preference but able to look to the left, question subtle left hemianopsia Face grossly symmetric Left upper extremity drift present.  Left lower extremity drift present Left-sided mild sensory decrease and extinction on double simultaneous stimulation  NIHSS 1A: Level of Consciousness - 0  1B: Ask Month and Age -44 1C: 'Blink Eyes' & 'Squeeze Hands' - 0 2: Test Horizontal Extraocular Movements - 1 3: Test Visual Fields - 1 4: Test Facial Palsy - 0 5A: Test Left Arm Motor Drift - 3 5B: Test Right Arm Motor Drift - 0 6A:  Test Left Leg Motor Drift - 1 6B: Test Right Leg Motor Drift - 0 7: Test Limb Ataxia - 0 8: Test Sensation - 2 9: Test Language/Aphasia- 0 10: Test Dysarthria - 1 11: Test Extinction/Inattention - 2 NIHSS score: 13   Imaging Reviewed: CT head with hypodensity in the right MCA territory consistent with evolving acute infarction.  Calcified right MCA aneurysm.  Extremely hyperdense material likely calcium at the MCA bifurcation on the right on the Noncon head CT.   CT angiography head and neck with a right M2 occlusion, CTA neck with possible nonflow limiting dissection in the right ICA at the skull base.  CT perfusion study with a nearly matched CBF to T-max-the mismatch volume of 11 cc I think is artifactual because it took some T-max greater than 6 in the left hemisphere which probably is not true.   Labs reviewed in epic and pertinent values follow: CBC    Component Value Date/Time   WBC 15.8 (H) 03/02/2021 1158   RBC 3.78 (L) 03/02/2021 1158   HGB 12.4 03/02/2021 1158   HCT 38.4 03/02/2021 1158   PLT 135 (L) 03/02/2021 1158   MCV 101.6 (H) 03/02/2021 1158   MCH 32.8 03/02/2021 1158   MCHC 32.3  03/02/2021 1158   RDW 13.7 03/02/2021 1158   LYMPHSABS 2.3 03/02/2021 1158   MONOABS 2.0 (H) 03/02/2021 1158   EOSABS 0.1 03/02/2021 1158   BASOSABS 0.1 03/02/2021 1158   CMP     Component Value Date/Time   NA 141 03/02/2021 1158   K 4.0 03/02/2021 1158   CL 105 03/02/2021 1158   CO2 23 03/02/2021 1158   GLUCOSE 142 (H) 03/02/2021 1158   BUN 19 03/02/2021 1158   CREATININE 0.94 03/02/2021 1158   CALCIUM 8.5 (L) 03/02/2021 1158   PROT 6.7 03/02/2021 1158   ALBUMIN 3.6 03/02/2021 1158   AST 30 03/02/2021 1158   ALT 17 03/02/2021 1158   ALKPHOS 63 03/02/2021 1158   BILITOT 0.9 03/02/2021 1158   GFRNONAA >60 03/02/2021 1158   GFRAA >60 03/13/2015 1107     Assessment: 80 year old with sudden onset of left-sided weakness, right gaze preference, left-sided neglect with a right MCA stroke secondary to a right M2 occlusion  There is also a right ICA nonflow limiting dissection seen at the right skull base.  Unclear if it is traumatic in nature and led to the further propagation of the clot for that M2 stroke or not. Outside the window for tPA Given the unfavorable perfusion profile and being outside the 6-hour mark, she is not a candidate for thrombectomy. Will need admission for stroke work-up-would recommend transfer to [facility] given the lack of availability of neurology services at [name] over the weekend.  Impression Right MCA stroke Right ICA nonflow limiting dissection  Recommendations:  Transfer to [facility] for stroke work-up Admit to hospitalist Frequent neurochecks Dual antiplatelets - ASA 81 + Plavix 75 mg daily High intensity statin 2D echo A1c Lipid panel PT OT Speech therapy Discussed imaging findings with the interventionalists to see if there is any interventional option available but agree that at this point proceeding with thrombectomy will be riddled with a very high risk for hemorrhagic transformation and the dissection does not  appear to be flow-limiting or having any sort of thrombus lodged which might need any form of intervention. Due to the absence of any thrombus seen at the dissection, no need for IV heparin.  Relayed plan to  Dr. Everlena Cooper at [facility]  Also discussed the plan with son Tacy Chavis.  -- Amie [place], MD [facility] Pager: [phone] If 7pm to 7am, please call on call as listed on AMION.  CRITICAL CARE ATTESTATION Performed by: Amie Portland, MD Total critical care time: 60 minutes Critical care time was exclusive of separately billable procedures and treating other patients and/or supervising APPs/Residents/Students Critical care was necessary to treat or prevent imminent or life-threatening deterioration due to acute ischemic stroke, right carotid dissection, right middle cerebral artery occlusion This patient is critically ill and at significant risk for neurological worsening and/or death and care requires constant monitoring. Critical care was time spent personally by me on telemedicine consultation for the following activities: development of treatment plan with patient and/or surrogate as well as nursing, discussions with consultants, evaluation of patient's response to treatment, examination of patient, obtaining history from patient or surrogate, ordering and performing treatments and interventions, ordering and review of laboratory studies, ordering and review of radiographic studies, pulse oximetry, re-evaluation of patient's condition, participation in multidisciplinary rounds and medical decision making of high complexity in the care of this patient.

## 2021-03-02 NOTE — ED Notes (Signed)
Called CT and informed of order for CT angio and CT perfusion.

## 2021-03-02 NOTE — ED Notes (Signed)
Patient transported to CT 

## 2021-03-02 NOTE — ED Notes (Signed)
Pt in CT unable to preform modified NIH at this time.

## 2021-03-03 ENCOUNTER — Inpatient Hospital Stay (HOSPITAL_COMMUNITY): Payer: PPO

## 2021-03-03 DIAGNOSIS — I63511 Cerebral infarction due to unspecified occlusion or stenosis of right middle cerebral artery: Secondary | ICD-10-CM | POA: Diagnosis not present

## 2021-03-03 LAB — ECHOCARDIOGRAM COMPLETE
Height: 67 in
S' Lateral: 3.8 cm
Weight: 4321.02 oz

## 2021-03-03 LAB — LIPID PANEL
Cholesterol: 154 mg/dL (ref 0–200)
HDL: 57 mg/dL (ref >40)
LDL Cholesterol: 74 mg/dL (ref 0–99)
Total CHOL/HDL Ratio: 2.7 RATIO
Triglycerides: 115 mg/dL (ref <150)
VLDL: 23 mg/dL (ref 0–40)

## 2021-03-03 LAB — HEMOGLOBIN A1C
Hgb A1c MFr Bld: 5.4 % (ref 4.8–5.6)
Mean Plasma Glucose: 108.28 mg/dL

## 2021-03-03 LAB — GLUCOSE, CAPILLARY: Glucose-Capillary: 138 mg/dL — ABNORMAL HIGH (ref 70–99)

## 2021-03-03 MED ORDER — ONDANSETRON HCL 4 MG/2ML IJ SOLN
4.0000 mg | Freq: Four times a day (QID) | INTRAMUSCULAR | Status: DC | PRN
  Administered 2021-03-06: 4 mg via INTRAVENOUS
  Filled 2021-03-03: qty 2

## 2021-03-03 MED ORDER — METHYLPREDNISOLONE SODIUM SUCC 40 MG IJ SOLR
40.0000 mg | Freq: Once | INTRAMUSCULAR | Status: AC
Start: 2021-03-03 — End: 2021-03-03
  Administered 2021-03-03: 40 mg via INTRAVENOUS
  Filled 2021-03-03: qty 1

## 2021-03-03 MED ORDER — IPRATROPIUM-ALBUTEROL 0.5-2.5 (3) MG/3ML IN SOLN
3.0000 mL | Freq: Three times a day (TID) | RESPIRATORY_TRACT | Status: DC
  Administered 2021-03-04 – 2021-03-05 (×4): 3 mL via RESPIRATORY_TRACT
  Filled 2021-03-03 (×4): qty 3

## 2021-03-03 MED ORDER — IPRATROPIUM-ALBUTEROL 0.5-2.5 (3) MG/3ML IN SOLN
3.0000 mL | Freq: Four times a day (QID) | RESPIRATORY_TRACT | Status: DC
  Administered 2021-03-03: 3 mL via RESPIRATORY_TRACT
  Filled 2021-03-03: qty 3

## 2021-03-03 MED ORDER — HYDROCODONE-ACETAMINOPHEN 5-325 MG PO TABS
1.0000 | ORAL_TABLET | Freq: Four times a day (QID) | ORAL | Status: DC | PRN
  Administered 2021-03-03: 2 via ORAL
  Administered 2021-03-03: 1 via ORAL
  Administered 2021-03-04: 2 via ORAL
  Filled 2021-03-03 (×3): qty 2

## 2021-03-03 MED ORDER — KETOROLAC TROMETHAMINE 15 MG/ML IJ SOLN
15.0000 mg | Freq: Once | INTRAMUSCULAR | Status: AC
  Administered 2021-03-03: 15 mg via INTRAVENOUS
  Filled 2021-03-03: qty 1

## 2021-03-03 MED ORDER — LIDOCAINE 5 % EX PTCH
1.0000 | MEDICATED_PATCH | CUTANEOUS | Status: DC
  Administered 2021-03-03 – 2021-03-09 (×7): 1 via TRANSDERMAL
  Filled 2021-03-03 (×7): qty 1

## 2021-03-03 NOTE — Evaluation (Signed)
Speech Language Pathology Evaluation Patient Details Name: [name] MRN: [id number] DOB: July 07, 1940 Today's Date: 03/03/2021 Time: 1696-7893 SLP Time Calculation (min) (ACUTE ONLY): 20 min  Problem List:  Patient Active Problem List   Diagnosis Date Noted   Acute ischemic right MCA stroke ([place]) 03/02/2021   Dissection of right carotid artery ([place]) 03/02/2021   Rib fractures 03/02/2021   OSA on CPAP 08/26/2020   Essential hypertension 06/20/2020   H/O TB skin testing 06/20/2020   Acute lower UTI 06/12/2020   COVID-19 06/11/2020   Chronic respiratory failure with hypoxia ([place]) 06/01/2019   COPD mixed type ([place]) 03/14/2015   Rheumatoid arthritis ([place]) 03/14/2015   Dyspnea 02/06/2015   ILD (interstitial lung disease) ([place]) 02/06/2015   Cough 05/17/2011   Past Medical History:  Past Medical History:  Diagnosis Date   Allergic rhinitis    Asthma    Asthmatic bronchitis    Colon polyps    COPD (chronic obstructive pulmonary disease) (HCC)    Diverticulosis    GERD (gastroesophageal reflux disease)    H/O: GI bleed    Hypercholesterolemia    Migraine headache    Obesity    RLS (restless legs syndrome)    Past Surgical History:  Past Surgical History:  Procedure Laterality Date   TONSILLECTOMY  x2   TOTAL ABDOMINAL HYSTERECTOMY  02/17/1989   HPI:  80 y.o. female presents to [facility] hospital on 03/02/2021 after being found down at home by son. Pt reports being unable to reach walker and experiencing a fall. CT head demonstrates R MCA stroke with R carotid artery dissection. PMH includes COPD/asthma, hypertension, rheumatoid arthritis, GERD, obstructive sleep apnea.   Assessment / Plan / Recommendation Clinical Impression  Patient presents with moderately impaired cognition as well as pragmatic and receptive language abilities secondary to acute right CVA. She does not have awareness to her deficits, exhibits left sided visual neglect, strongly suspected motor apraxia impacting  left hand but did not present with oral-motor apraxia. She was oriented to year, month, knew she was in the hospital but stated incorrect hospital name. She was able to tell SLP that she fell and had a stroke. Speech was fluent and no expressive aphasia observed. Difficult to fully assess patient's receptive language abilities as her attention was significantly impaired. She was able to hold cup in right hand when placed there, but when placed in left hand, she would start losing control of cup without awareness. She was not able to utilize utensil in dominant (left) hand due to suspected apraxia. She was not able to visually focus on items right in front, center of her visual field and would seemed to be looking off into distance when asked to find an item being held out for her. She was pleasant but as her daughter in law pointed out was "acting childish" which is new since CVA per family report. Patient's deficits in attention, awareness, safety, problem solving, motor apraxia are all significantly impaired and she will require skilled SLP intervention to improve during acute care stay as well as recommendation for inpatient rehab.    SLP Assessment  SLP Recommendation/Assessment: Patient needs continued Speech Lanaguage Pathology Services SLP Visit Diagnosis: Aphasia (R47.01);Cognitive communication deficit (R41.841)    Recommendations for follow up therapy are one component of a multi-disciplinary discharge planning process, led by the attending physician.  Recommendations may be updated based on patient status, additional functional criteria and insurance authorization.    Follow Up Recommendations  Acute inpatient rehab (3hours/day)  Assistance Recommended at Discharge  Frequent or constant Supervision/Assistance  Functional Status Assessment Patient has had a recent decline in their functional status and demonstrates the ability to make significant improvements in function in a reasonable and  predictable amount of time.  Frequency and Duration min 2x/week  2 weeks      SLP Evaluation Cognition  Overall Cognitive Status: Impaired/Different from baseline Arousal/Alertness: Awake/alert Orientation Level: Oriented to person;Oriented to situation;Other (comment) (oriented to month and year, not date or day of week, oriented to "hospital" but stating "[facility]" (she had been there prior)) Year: 2022 Month: November Day of Week: Incorrect Attention: Focused;Sustained Focused Attention: Impaired Focused Attention Impairment: Verbal basic;Functional basic Sustained Attention: Impaired Sustained Attention Impairment: Verbal basic;Functional basic Memory:  (not fully assessed secondary to attention deficits) Awareness: Impaired Awareness Impairment: Emergent impairment Problem Solving: Impaired Problem Solving Impairment: Verbal basic;Functional basic Executive Function: Self Monitoring Self Monitoring: Impaired Self Monitoring Impairment: Functional basic Behaviors: Impulsive Safety/Judgment: Impaired Comments: patient without awareness to stroke deficits, only aware of pain from ribs       Comprehension  Auditory Comprehension Overall Auditory Comprehension: Impaired Commands: Impaired One Step Basic Commands: 50-74% accurate Conversation: Simple Interfering Components: Attention;Processing speed;Motor planning EffectiveTechniques: Extra processing time;Repetition Visual Recognition/Discrimination Discrimination: Not tested Reading Comprehension Reading Status: Not tested    Expression Expression Primary Mode of Expression: Verbal Verbal Expression Overall Verbal Expression: Impaired Initiation: No impairment Repetition: No impairment Naming: Not tested Pragmatics: Impairment Impairments: Abnormal affect;Eye contact;Interpretation of nonverbal communication;Turn Taking Interfering Components: Attention Effective Techniques: Open ended questions Non-Verbal  Means of Communication: Not applicable Written Expression Dominant Hand: Left Written Expression: Not tested   Oral / Motor  Oral Motor/Sensory Function Overall Oral Motor/Sensory Function: Mild impairment Facial ROM: Reduced left Facial Symmetry: Abnormal symmetry left Facial Sensation: Reduced left Lingual ROM: Reduced left Lingual Symmetry: Abnormal symmetry left Lingual Strength: Within Functional Limits Velum: Within Functional Limits Mandible: Within Functional Limits Motor Speech Overall Motor Speech: Appears within functional limits for tasks assessed Respiration: Within functional limits Phonation: Normal Resonance: Within functional limits Articulation: Within functional limitis Intelligibility: Intelligible Motor Planning: Witnin functional limits Motor Speech Errors: Not applicable   GO                    Sonia Baller, MA, CCC-SLP Speech Therapy

## 2021-03-03 NOTE — Evaluation (Signed)
Clinical/Bedside Swallow Evaluation Patient Details  Name: [name] MRN: [id number] Date of Birth: 1940/10/19  Today's Date: 03/03/2021 Time: SLP Start Time (ACUTE ONLY): 41 SLP Stop Time (ACUTE ONLY): 1655 SLP Time Calculation (min) (ACUTE ONLY): 20 min  Past Medical History:  Past Medical History:  Diagnosis Date   Allergic rhinitis    Asthma    Asthmatic bronchitis    Colon polyps    COPD (chronic obstructive pulmonary disease) (HCC)    Diverticulosis    GERD (gastroesophageal reflux disease)    H/O: GI bleed    Hypercholesterolemia    Migraine headache    Obesity    RLS (restless legs syndrome)    Past Surgical History:  Past Surgical History:  Procedure Laterality Date   TONSILLECTOMY  x2   TOTAL ABDOMINAL HYSTERECTOMY  02/17/1989   HPI:  80 y.o. female presents to [facility] hospital on 03/02/2021 after being found down at home by son. Pt reports being unable to reach walker and experiencing a fall. CT head demonstrates R MCA stroke with R carotid artery dissection. PMH includes COPD/asthma, hypertension, rheumatoid arthritis, GERD, obstructive sleep apnea. She has been NPO awaiting BSE.    Assessment / Plan / Recommendation  Clinical Impression  Patient presents with clinical s/s of dysphagia as per this bedside swallow evaluation. She exhibited poor awareness to boluses and SLP had to place cup and/or spoon in her hand before she was able to demonstrate self-feeding. She exhibited a suspected delayed swallow initiation but no overt s/s aspiration or penetration observed and no changes in patient's voice of vitals during and after PO intake. Patient was able to masticate a saltine cracker, however she does not have her dentures here and she reportedly uses them all the time. Family to bring dentures in tomorrow. SLP is recommending to initiate Dys 1 solids, thin liquids diet with plan to trial upgraded solids when dentures are here. Family educated and in agreement. SLP Visit  Diagnosis: Dysphagia, unspecified (R13.10)    Aspiration Risk  Mild aspiration risk    Diet Recommendation Dysphagia 1 (Puree);Thin liquid   Liquid Administration via: Cup;Straw Medication Administration: Whole meds with puree Supervision: Full supervision/cueing for compensatory strategies;Staff to assist with self feeding Compensations: Minimize environmental distractions;Small sips/bites;Slow rate Postural Changes: Seated upright at 90 degrees    Other  Recommendations Oral Care Recommendations: Oral care BID;Staff/trained caregiver to provide oral care    Recommendations for follow up therapy are one component of a multi-disciplinary discharge planning process, led by the attending physician.  Recommendations may be updated based on patient status, additional functional criteria and insurance authorization.  Follow up Recommendations Acute inpatient rehab (3hours/day)      Assistance Recommended at Discharge Frequent or constant Supervision/Assistance  Functional Status Assessment Patient has had a recent decline in their functional status and demonstrates the ability to make significant improvements in function in a reasonable and predictable amount of time.  Frequency and Duration min 2x/week  2 weeks       Prognosis Prognosis for Safe Diet Advancement: Good      Swallow Study   General Date of Onset: 03/03/21 HPI: 80 y.o. female presents to [facility] hospital on 03/02/2021 after being found down at home by son. Pt reports being unable to reach walker and experiencing a fall. CT head demonstrates R MCA stroke with R carotid artery dissection. PMH includes COPD/asthma, hypertension, rheumatoid arthritis, GERD, obstructive sleep apnea. She has been NPO awaiting BSE. Type of Study: Bedside Swallow Evaluation  Previous Swallow Assessment: none found Diet Prior to this Study: NPO Respiratory Status: Nasal cannula History of Recent Intubation: No Behavior/Cognition:  Alert;Cooperative;Pleasant mood;Confused;Distractible;Requires cueing Oral Cavity Assessment: Within Functional Limits Oral Care Completed by SLP: No Oral Cavity - Dentition: Missing dentition;Other (Comment) (some bottom teeth but upper dentures not present (family to bring in tomorrow)) Vision: Impaired for self-feeding Self-Feeding Abilities: Total assist Patient Positioning: Upright in bed Baseline Vocal Quality: Normal Volitional Cough: Strong Volitional Swallow: Able to elicit    Oral/Motor/Sensory Function Overall Oral Motor/Sensory Function: Mild impairment Facial ROM: Reduced left Facial Symmetry: Abnormal symmetry left Facial Strength: Within Functional Limits Facial Sensation: Reduced left Lingual ROM: Within Functional Limits Lingual Symmetry: Abnormal symmetry left Lingual Strength: Within Functional Limits Velum: Within Functional Limits Mandible: Within Functional Limits   Ice Chips     Thin Liquid Thin Liquid: Impaired Presentation: Straw Oral Phase Impairments: Poor awareness of bolus Pharyngeal  Phase Impairments: Suspected delayed Swallow    Nectar Thick     Honey Thick     Puree Puree: Impaired Oral Phase Impairments: Poor awareness of bolus   Solid     Solid: Impaired Oral Phase Impairments: Impaired mastication Oral Phase Functional Implications: Prolonged oral transit      Sonia Baller, MA, CCC-SLP Speech Therapy

## 2021-03-03 NOTE — Progress Notes (Signed)
PROGRESS NOTE    [name]  JOI:[id number] DOB: 11/02/1940 DOA: 03/02/2021 PCP: Lajean Manes, MD   Brief Narrative: 80 year old with past medical history significant for COPD/asthma, hypertension, rheumatoid arthritis, GERD, obstructive sleep apnea who was found down on the floor by her son earlier the day of admission 03/02/2021.  Patient was last known well around 10 PM tonight prior to admission when she went to bed.  Patient does not remember when she got up, but stated she got up in order to use the bathroom but was unable to get to her walker and fell.  She reported having difficulty moving the left side of her body.  Son tried to get in touch with her and she did not answer, he broke into her house and found her on the ground.  Evaluation in the ED CT head showed right MCA stroke with right carotid artery dissection.   Assessment & Plan:   Principal Problem:   Acute ischemic right MCA stroke (HCC) Active Problems:   COPD mixed type (HCC)   Rheumatoid arthritis ([place])   Acute lower UTI   Essential hypertension   OSA on CPAP   Dissection of right carotid artery (HCC)   Rib fractures   1-Acute Right MCA Stroke:  Likely embolic secondary to possible right ICA dissection with right M2 occlusion and small vessel disease source. MRI: Large acute posterior right MCA territory infarct.  Small focus of acute ischemia in the left centrum semiovale old.  Superiorly directed supraclinoid right ICA aneurysm. CTA: Acute right M2 MCA occlusion.  Dissection of the right internal carotid artery at the skull base with visible intimal flap, a small pseudoaneurysm and patent false and true lumens.  Superiorly 80 x 10 mm right supraclinoid ICA aneurysmal.  Bilateral carotid bifurcation atherosclerosis with 40% stenosis of the proximal right ICA. LDL: 74 A1c: 0.4 2D echo: Pending Neurology recommended aspirin and Plavix. PT recommended CIR On creastro.   2-Right Carotid Artery dissection:   Neurology discussed with neuro interventionist.  No thrombectomy due to very high risk for hemorrhagic transformation the dissection does not appear to be flow-limiting or any sort of thrombus large.  Due to absence of any thrombus no need for IV heparin.  Further management per neurology  COPD: Possible exacerbation Patient report worsening shortness of breath.  She is not able to take deep breaths. Of shortness of breath related to rib fracture.  With we will proceed with IVF steroids, scheduled nebulizer -CT chest 11/18: Patient with prominent fibrotic changes in both lung, overall not significantly progressed since 2021.  Patchy opacity in the lateral left base and lingula reflect additional areas of scar or atelectasis. -On Breo-incruse.  -on chronic prednisone.   Anterior Rib Fx:  Patient complaining of rib pain and shortness of breath.  Plan for lidocaine patch, changed tramadol to Vicodin Incentive spirometry.  UTI; presented with UA with more than 50 white blood cell positive nitrate Follow urine culture Continue with IV ceftriaxone   HTN: Permissive hypertension normalizing 5 to 7 days Holding Norvasc and lisinopril   RA; On Plaquenil, [place].   OSA on CPAP:  CPAP ordered.         Estimated body mass index is 42.3 kg/m as calculated from the following:   Height as of this encounter: 5\' 7"  (1.702 m).   Weight as of this encounter: 122.5 kg.   DVT prophylaxis: SCD Code Status: DNR, discussed with son who was at bedside.  Family Communication: Son at bedside  updated.  Disposition Plan:  Status is: Inpatient  Remains inpatient appropriate because: admitted with stroke.     Consultants:  Neurology   Procedures:  ECHO pending  Antimicrobials:  Ceftriaxone  Subjective: She is alert, keep eyes close, able to moves all 4 extremities. She report SOB, feels cant breath.  On 2 L. She uses oxygen at home   Objective: Vitals:   03/02/21 2120 03/02/21 2301  03/03/21 0000 03/03/21 0345  BP: (!) 119/100 (!) 170/79 126/77 (!) 146/76  Pulse: (!) 105 (!) 104 (!) 108   Resp: 20 17 17 20   Temp:    98.3 F (36.8 C)  TempSrc:    Oral  SpO2: 100% 96% 100% 99%  Weight:  122.5 kg    Height:  5\' 7"  (1.702 m)      Intake/Output Summary (Last 24 hours) at 03/03/2021 0740 Last data filed at 03/02/2021 1958 Gross per 24 hour  Intake 1100 ml  Output --  Net 1100 ml   Filed Weights   03/02/21 2301  Weight: 122.5 kg    Examination:  General exam: NAD Respiratory system: BL air movement, decreased breath sounds Cardiovascular system: S1 & S2 heard, RRR.  Gastrointestinal system: Abdomen is nondistended, soft and nontender. No organomegaly or masses felt. Normal bowel sounds heard. Central nervous system: Alert and oriented to person, moves all 4 extremities.  Extremities: no edema   Data Reviewed: I have personally reviewed following labs and imaging studies  CBC: Recent Labs  Lab 03/02/21 1158  WBC 15.8*  NEUTROABS 11.3*  HGB 12.4  HCT 38.4  MCV 101.6*  PLT 277*   Basic Metabolic Panel: Recent Labs  Lab 03/02/21 1158  NA 141  K 4.0  CL 105  CO2 23  GLUCOSE 142*  BUN 19  CREATININE 0.94  CALCIUM 8.5*   GFR: Estimated Creatinine Clearance: 63.7 mL/min (by C-G formula based on SCr of 0.94 mg/dL). Liver Function Tests: Recent Labs  Lab 03/02/21 1158  AST 30  ALT 17  ALKPHOS 63  BILITOT 0.9  PROT 6.7  ALBUMIN 3.6   No results for input(s): LIPASE, AMYLASE in the last 168 hours. No results for input(s): AMMONIA in the last 168 hours. Coagulation Profile: Recent Labs  Lab 03/02/21 1158  INR 0.9   Cardiac Enzymes: No results for input(s): CKTOTAL, CKMB, CKMBINDEX, TROPONINI in the last 168 hours. BNP (last 3 results) No results for input(s): PROBNP in the last 8760 hours. HbA1C: Recent Labs    03/03/21 0046  HGBA1C 5.4   CBG: Recent Labs  Lab 03/02/21 1206  GLUCAP 141*   Lipid Profile: Recent Labs     03/03/21 0046  CHOL 154  HDL 57  LDLCALC 74  TRIG 115  CHOLHDL 2.7   Thyroid Function Tests: No results for input(s): TSH, T4TOTAL, FREET4, T3FREE, THYROIDAB in the last 72 hours. Anemia Panel: No results for input(s): VITAMINB12, FOLATE, FERRITIN, TIBC, IRON, RETICCTPCT in the last 72 hours. Sepsis Labs: No results for input(s): PROCALCITON, LATICACIDVEN in the last 168 hours.  Recent Results (from the past 240 hour(s))  Resp Panel by RT-PCR (Flu A&B, Covid) Nasopharyngeal Swab     Status: None   Collection Time: 03/02/21  3:30 PM   Specimen: Nasopharyngeal Swab; Nasopharyngeal(NP) swabs in vial transport medium  Result Value Ref Range Status   SARS Coronavirus 2 by RT PCR NEGATIVE NEGATIVE Final    Comment: (NOTE) SARS-CoV-2 target nucleic acids are NOT DETECTED.  The SARS-CoV-2 RNA is generally detectable  in upper respiratory specimens during the acute phase of infection. The lowest concentration of SARS-CoV-2 viral copies this assay can detect is 138 copies/mL. A negative result does not preclude SARS-Cov-2 infection and should not be used as the sole basis for treatment or other patient management decisions. A negative result may occur with  improper specimen collection/handling, submission of specimen other than nasopharyngeal swab, presence of viral mutation(s) within the areas targeted by this assay, and inadequate number of viral copies(<138 copies/mL). A negative result must be combined with clinical observations, patient history, and epidemiological information. The expected result is Negative.  Fact Sheet for Patients:  EntrepreneurPulse.com.au  Fact Sheet for Healthcare Providers:  IncredibleEmployment.be  This test is no t yet approved or cleared by the [place] FDA and  has been authorized for detection and/or diagnosis of SARS-CoV-2 by FDA under an Emergency Use Authorization (EUA). This EUA will remain  in effect  (meaning this test can be used) for the duration of the COVID-19 declaration under Section 564(b)(1) of the Act, 21 U.S.C.section 360bbb-3(b)(1), unless the authorization is terminated  or revoked sooner.       Influenza A by PCR NEGATIVE NEGATIVE Final   Influenza B by PCR NEGATIVE NEGATIVE Final    Comment: (NOTE) The Xpert Xpress SARS-CoV-2/FLU/RSV plus assay is intended as an aid in the diagnosis of influenza from Nasopharyngeal swab specimens and should not be used as a sole basis for treatment. Nasal washings and aspirates are unacceptable for Xpert Xpress SARS-CoV-2/FLU/RSV testing.  Fact Sheet for Patients: EntrepreneurPulse.com.au  Fact Sheet for Healthcare Providers: IncredibleEmployment.be  This test is not yet approved or cleared by the [place] FDA and has been authorized for detection and/or diagnosis of SARS-CoV-2 by FDA under an Emergency Use Authorization (EUA). This EUA will remain in effect (meaning this test can be used) for the duration of the COVID-19 declaration under Section 564(b)(1) of the Act, 21 U.S.C. section 360bbb-3(b)(1), unless the authorization is terminated or revoked.  Performed at [facility], [address] [place], [place] [address]          Radiology Studies: CT HEAD WO CONTRAST  Result Date: 03/02/2021 CLINICAL DATA:  Head trauma, minor (Age >= 65y); Neck trauma (Age >= 65y) EXAM: CT HEAD WITHOUT CONTRAST CT CERVICAL SPINE WITHOUT CONTRAST TECHNIQUE: Multidetector CT imaging of the head and cervical spine was performed following the standard protocol without intravenous contrast. Multiplanar CT image reconstructions of the cervical spine were also generated. COMPARISON:  06/11/2020 FINDINGS: CT HEAD FINDINGS Brain: Large area of decreased attenuation with loss of gray-white differentiation predominantly involving the right parietal lobe suspicious for acute infarction. No evidence of  intracranial hemorrhage. No extra-axial collection. Prior left basal ganglia lacunar infarct. Scattered low-density changes within the periventricular and subcortical white matter compatible with chronic microvascular ischemic change. Mild diffuse cerebral volume loss. Vascular: Atherosclerotic calcifications involving the large vessels of the skull base. 7 mm partially peripherally calcified structure in the region of the proximal right MCA, possibly reflecting a small partially calcified aneurysm sac. No unexpected hyperdense vessel. Skull: Normal. Negative for fracture or focal lesion. Sinuses/Orbits: No acute finding. Other: None. CT CERVICAL SPINE FINDINGS Alignment: Facet joints are aligned without dislocation or traumatic listhesis. Dens and lateral masses are aligned. Straightening of the cervical lordosis. Skull base and vertebrae: No acute fracture. No primary bone lesion or focal pathologic process. Soft tissues and spinal canal: No prevertebral fluid or swelling. No visible canal hematoma. Disc levels: Degenerative disc disease most  pronounced at C5-6 and C6-7. Multilevel bilateral facet arthropathy. Upper chest: Negative. Other: Medial course of the bilateral common carotid arteries. Carotid atherosclerosis. IMPRESSION: 1. Findings suggestive of acute right MCA territory infarction. Further evaluation with MRI of the brain is recommended. 2. Incidentally noted 7 mm partially peripherally calcified structure in the region of the proximal right MCA, suspicious for a small partially calcified aneurysm sac. Attention on follow-up vascular imaging. 3. No acute fracture or traumatic listhesis of the cervical spine. 4. Degenerative disc disease and facet arthropathy of the cervical spine, most pronounced at C5-6 and C6-7. These results were called by telephone at the time of interpretation on 03/02/2021 at 1:35 pm to provider JULIE HAVILAND , who verbally acknowledged these results. Electronically Signed   By:  Davina Poke D.O.   On: 03/02/2021 13:35   CT Chest Wo Contrast  Result Date: 03/02/2021 CLINICAL DATA:  Shortness of breath, fall history of COPD, asthma EXAM: CT CHEST WITHOUT CONTRAST TECHNIQUE: Multidetector CT imaging of the chest was performed following the standard protocol without IV contrast. COMPARISON:  CT chest 11/05/2019 FINDINGS: Cardiovascular: The heart is not enlarged. There is no pericardial effusion. There are dense mitral annular calcifications, mild aortic valve calcifications, three-vessel coronary artery disease and calcified atherosclerotic plaque throughout the thoracic aorta. Mediastinum/Nodes: The thyroid is unremarkable. The esophagus is grossly unremarkable. There is a small hiatal hernia. There is no mediastinal or axillary lymphadenopathy. There is no bulky hilar adenopathy. Lungs/Pleura: The trachea and central airways are patent. Moderate centrilobular and paraseptal emphysema is again seen with mild central bronchial wall thickening. Again seen is peripheral fibrotic change with areas of honeycombing more predominant in the lung bases. The findings are not significantly progressed since 11/05/2019. Patchy opacities in the lateral left base and lingula likely reflect areas of scarring or atelectasis. There is no focal consolidation or pulmonary edema. There is no pleural effusion or pneumothorax. Upper Abdomen: Contrast is seen in the right collecting system from a prior study. There are no acute findings in the imaged upper abdomen. Musculoskeletal: There acute nondisplaced fractures of the left sixth and seventh ribs anterolaterally. IMPRESSION: 1. Acute nondisplaced fractures of the left anterolateral sixth and seventh ribs. 2. Basal prominent fibrotic change in both lungs, overall not significantly progressed since 2021. Additional patchy opacities in the lateral left base and lingula likely reflect additional areas of scar or atelectasis. 3. Dense mitral annular  calcifications, aortic valve calcifications, and three-vessel coronary artery calcification. 4. Aortic Atherosclerosis (ICD10-I70.0) and Emphysema (ICD10-J43.9). Electronically Signed   By: Valetta Mole M.D.   On: 03/02/2021 16:32   CT CERVICAL SPINE WO CONTRAST  Result Date: 03/02/2021 CLINICAL DATA:  Head trauma, minor (Age >= 65y); Neck trauma (Age >= 65y) EXAM: CT HEAD WITHOUT CONTRAST CT CERVICAL SPINE WITHOUT CONTRAST TECHNIQUE: Multidetector CT imaging of the head and cervical spine was performed following the standard protocol without intravenous contrast. Multiplanar CT image reconstructions of the cervical spine were also generated. COMPARISON:  06/11/2020 FINDINGS: CT HEAD FINDINGS Brain: Large area of decreased attenuation with loss of gray-white differentiation predominantly involving the right parietal lobe suspicious for acute infarction. No evidence of intracranial hemorrhage. No extra-axial collection. Prior left basal ganglia lacunar infarct. Scattered low-density changes within the periventricular and subcortical white matter compatible with chronic microvascular ischemic change. Mild diffuse cerebral volume loss. Vascular: Atherosclerotic calcifications involving the large vessels of the skull base. 7 mm partially peripherally calcified structure in the region of the proximal right MCA, possibly reflecting  a small partially calcified aneurysm sac. No unexpected hyperdense vessel. Skull: Normal. Negative for fracture or focal lesion. Sinuses/Orbits: No acute finding. Other: None. CT CERVICAL SPINE FINDINGS Alignment: Facet joints are aligned without dislocation or traumatic listhesis. Dens and lateral masses are aligned. Straightening of the cervical lordosis. Skull base and vertebrae: No acute fracture. No primary bone lesion or focal pathologic process. Soft tissues and spinal canal: No prevertebral fluid or swelling. No visible canal hematoma. Disc levels: Degenerative disc disease most  pronounced at C5-6 and C6-7. Multilevel bilateral facet arthropathy. Upper chest: Negative. Other: Medial course of the bilateral common carotid arteries. Carotid atherosclerosis. IMPRESSION: 1. Findings suggestive of acute right MCA territory infarction. Further evaluation with MRI of the brain is recommended. 2. Incidentally noted 7 mm partially peripherally calcified structure in the region of the proximal right MCA, suspicious for a small partially calcified aneurysm sac. Attention on follow-up vascular imaging. 3. No acute fracture or traumatic listhesis of the cervical spine. 4. Degenerative disc disease and facet arthropathy of the cervical spine, most pronounced at C5-6 and C6-7. These results were called by telephone at the time of interpretation on 03/02/2021 at 1:35 pm to provider JULIE HAVILAND , who verbally acknowledged these results. Electronically Signed   By: Davina Poke D.O.   On: 03/02/2021 13:35   MR BRAIN WO CONTRAST  Result Date: 03/03/2021 CLINICAL DATA:  Acute neurologic deficit EXAM: MRI HEAD WITHOUT CONTRAST TECHNIQUE: Multiplanar, multiecho pulse sequences of the brain and surrounding structures were obtained without intravenous contrast. COMPARISON:  None. FINDINGS: Brain: Large acute infarct the posterior right MCA territory. Small focus of abnormal diffusion restriction in the left centrum semi ovale. No acute or chronic hemorrhage. There is multifocal hyperintense T2-weighted signal within the white matter. Generalized volume loss without a clear lobar predilection. Old left basal ganglia small vessel infarct and multiple cerebellar infarcts. The midline structures are normal. Vascular: Superiorly directed supraclinoid right ICA aneurysm, as better demonstrated on earlier CTA head neck. Skull and upper cervical spine: Normal calvarium and skull base. Visualized upper cervical spine and soft tissues are normal. Sinuses/Orbits:No paranasal sinus fluid levels or advanced mucosal  thickening. No mastoid or middle ear effusion. Normal orbits. IMPRESSION: 1. Large acute posterior right MCA territory infarct. No hemorrhage or mass effect. 2. Small focus of acute ischemia in the left centrum semiovale. 3. Superiorly directed supraclinoid right ICA aneurysm, as better demonstrated on earlier CTA head neck. Electronically Signed   By: Ulyses Jarred M.D.   On: 03/03/2021 03:38   DG Chest Port 1 View  Result Date: 03/02/2021 CLINICAL DATA:  Short of breath and chest pain EXAM: PORTABLE CHEST 1 VIEW COMPARISON:  06/11/2020 FINDINGS: Cardiac enlargement without heart failure. Atherosclerotic calcification aortic arch. Mild bibasilar atelectasis similar to prior study. No significant pleural effusion. IMPRESSION: Mild bibasilar atelectasis. Negative for heart failure. Underlying COPD. Electronically Signed   By: Franchot Gallo M.D.   On: 03/02/2021 12:46   CT ANGIO HEAD NECK W WO CM W PERF (CODE STROKE)  Result Date: 03/02/2021 CLINICAL DATA:  Stroke, follow up EXAM: CT ANGIOGRAPHY HEAD AND NECK CT PERFUSION BRAIN TECHNIQUE: Multidetector CT imaging of the head and neck was performed using the standard protocol during bolus administration of intravenous contrast. Multiplanar CT image reconstructions and MIPs were obtained to evaluate the vascular anatomy. Carotid stenosis measurements (when applicable) are obtained utilizing NASCET criteria, using the distal internal carotid diameter as the denominator. Multiphase CT imaging of the brain was performed following IV  bolus contrast injection. Subsequent parametric perfusion maps were calculated using RAPID software. CONTRAST:  149mL OMNIPAQUE IOHEXOL 350 MG/ML SOLN COMPARISON:  None. FINDINGS: CTA NECK FINDINGS Aorta: Calcific atherosclerosis.  Great vessel origins are patent. Right carotid system: Common carotid artery is patent. Calcific atherosclerosis at the carotid bifurcation with approximately 40% stenosis. Dissection of the right internal  carotid artery at the skull base with visible intimal flap (series 9, images 146 through 156; series 10, images 122 through 126). Mild dilation in this region, compatible with small pseudoaneurysm. Both the false and true lumens are patent. Retropharyngeal course. Left carotid system: Carotid bifurcation atherosclerosis without greater than 50% stenosis. Mild irregularity of the ICA at the skull base, probably atherosclerotic given no intimal flap to suggest dissection. The ICAs tortuous at the skull base. Retropharyngeal course. Vertebral arteries: Mildly right dominant. Patent. Mild atherosclerotic irregularity bilaterally. Skeleton: Multilevel degenerative change of the cervical spine. Other neck: No acute abnormality. Upper chest: Emphysema. No consolidation visualized lung apices. Biapical pleuroparenchymal scarring. Review of the MIP images confirms the above findings CTA HEAD FINDINGS Anterior circulation: Bilateral intracranial ICAs are patent with mild atherosclerotic narrowing. Bilateral M1 MCAs are patent. Occlusion of the proximal right M2 MCA. 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 Motion limited evaluation of the ACAs and distal MCAs. The ACAs appear to be grossly patent. Posterior circulation: Dominant right intradural vertebral artery. Bilateral intradural vertebral artery, basilar artery, and posterior cerebral arteries are patent without proximal high-grade stenosis. Prominent right posterior communicating artery with small right P1 PCA, anatomic variant. Motion limited evaluation of the PCAs. Venous sinuses: As permitted by contrast timing, patent. Anatomic variants: Detailed above. CT Brain Perfusion Findings: CBF (<30%) Volume: 72mL Perfusion (Tmax>6.0s) volume: 53mL. Possibly overestimated given probably artifactual T-max greater than 6 seconds in the left anterior and middle cranial fossa. Mismatch Volume: 1mL Infarction  Location:Posterior right MCA territory. IMPRESSION: 1. Acute right M2 MCA occlusion. 2. 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 3. On CT perfusion, largely completed right distal MCA territory infarct with 55 mL core infarct and 11 mL penumbra. Penumbra may be overestimated given probably artifactual T-max greater than 6 seconds in the left anterior and anterior middle cranial fossa. 4. 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 5. Bilateral carotid bifurcation atherosclerosis with approximately 40% stenosis of the proximal right ICA. 6. Aortic Atherosclerosis (ICD10-I70.0) and Emphysema (ICD10-J43.9). LVO, perfusion findings, and aneurysm discussed with Dr. Rory Percy 2:25 PM via telephone. Dissection discussed at 2:35 p.m. via telephone. Electronically Signed   By: Margaretha Sheffield M.D.   On: 03/02/2021 14:57        Scheduled Meds:   stroke: mapping our early stages of recovery book   Does not apply Once   aspirin EC  81 mg Oral Daily   bacitracin   Topical BID   clopidogrel  75 mg Oral Daily   fluticasone furoate-vilanterol  1 puff Inhalation Daily   And   umeclidinium bromide  1 puff Inhalation Daily   hydroxychloroquine  200 mg Oral BID   leflunomide  20 mg Oral q morning   loratadine  10 mg Oral Daily   LORazepam  0.5 mg Intravenous Once   predniSONE  7.5 mg Oral Q breakfast   rOPINIRole  1 mg Oral QHS   rosuvastatin  20 mg Oral Daily   Continuous Infusions:  sodium  chloride 125 mL/hr at 03/03/21 0551   cefTRIAXone (ROCEPHIN)  IV Stopped (03/02/21 1958)     LOS: 1 day    Time spent: 35 minutes.     Elmarie Shiley, MD [facility]   If 7PM-7AM, please contact night-coverage [email or web address]  03/03/2021, 7:40 AM

## 2021-03-03 NOTE — Evaluation (Signed)
Physical Therapy Evaluation Patient Details Name: [name] MRN: [id number] DOB: [birth year] Today's Date: 03/03/2021  History of Present Illness  80 y.o. female presents to [facility] hospital on 03/02/2021 after being found down at home by son. Pt reports being unable to reach walker and experiencing a fall. CT head demonstrates R MCA stroke with R carotid artery dissection. PMH includes COPD/asthma, hypertension, rheumatoid arthritis, GERD, obstructive sleep apnea.  Clinical Impression  Pt presents to PT with deficits in gait, functional mobility, balance, endurance, visual-spatial awareness, coordination, sensation. Pt demonstrates L sided coordination and sensation deficits along with L neglect, all placing her at a high risk for falls currently. Pt will benefit from aggressive mobilization from PT to aide in improving balance and reducing falls risk.  PT provides education to family on increasing pt awareness to L side. PT recommends AIR placement.     Recommendations for follow up therapy are one component of a multi-disciplinary discharge planning process, led by the attending physician.  Recommendations may be updated based on patient status, additional functional criteria and insurance authorization.  Follow Up Recommendations Acute inpatient rehab (3hours/day)    Assistance Recommended at Discharge Frequent or constant Supervision/Assistance  Functional Status Assessment Patient has had a recent decline in their functional status and demonstrates the ability to make significant improvements in function in a reasonable and predictable amount of time.  Equipment Recommendations   (TBD)    Recommendations for Other Services Rehab consult     Precautions / Restrictions Precautions Precautions: Fall Precaution Comments: L neglect Restrictions Weight Bearing Restrictions: No      Mobility  Bed Mobility Overal bed mobility: Needs Assistance Bed Mobility: Supine to Sit     Supine to  sit: Mod assist;HOB elevated          Transfers Overall transfer level: Needs assistance Equipment used: Rolling walker (2 wheels) Transfers: Sit to/from Stand;Bed to chair/wheelchair/BSC Sit to Stand: Min assist   Step pivot transfers: Mod assist       General transfer comment: verbal and tactile cues for transfer sequencing    Ambulation/Gait                  Stairs            Wheelchair Mobility    Modified Rankin (Stroke Patients Only)       Balance Overall balance assessment: Needs assistance Sitting-balance support: No upper extremity supported;Feet supported Sitting balance-Leahy Scale: Good     Standing balance support: Reliant on assistive device for balance;Bilateral upper extremity supported Standing balance-Leahy Scale: Poor                               Pertinent Vitals/Pain Pain Assessment: Faces Faces Pain Scale: Hurts little more Pain Location: ribs Pain Descriptors / Indicators: Aching Pain Intervention(s): Monitored during session    Home Living Family/patient expects to be discharged to:: Private residence Living Arrangements: Alone Available Help at Discharge: Family;Available PRN/intermittently Type of Home: House Home Access: Ramped entrance       Home Layout: One level Home Equipment: Rollator (4 wheels) Additional Comments: home O2    Prior Function Prior Level of Function : Independent/Modified Independent             Mobility Comments: pt does not drive, ambulates with rollator       Hand Dominance   Dominant Hand: Left    Extremity/Trunk Assessment   Upper Extremity Assessment  Upper Extremity Assessment: LUE deficits/detail LUE Deficits / Details: LUE ROM WFL, grossly 4/5 LUE Sensation: decreased light touch LUE Coordination: decreased gross motor;decreased fine motor    Lower Extremity Assessment Lower Extremity Assessment: LLE deficits/detail LLE Deficits / Details: ROM WFL,  grossly 4+/5 LLE Sensation: decreased light touch    Cervical / Trunk Assessment Cervical / Trunk Assessment: Normal  Communication   Communication: No difficulties  Cognition Arousal/Alertness: Awake/alert Behavior During Therapy: WFL for tasks assessed/performed Overall Cognitive Status: Impaired/Different from baseline Area of Impairment: Orientation;Attention;Memory;Following commands;Awareness;Safety/judgement;Problem solving                 Orientation Level: Disoriented to;Place (pt reports she is still at [facility] Current Attention Level: Sustained Memory: Decreased recall of precautions Following Commands: Follows one step commands consistently Safety/Judgement: Decreased awareness of deficits Awareness: Intellectual Problem Solving: Slow processing;Requires verbal cues;Difficulty sequencing          General Comments General comments (skin integrity, edema, etc.): pt on 3L [place] on arrival, wears 2L [place] at baseline. PT weans to 2L [place] with stable sats. Tachy into 120s. Pt neglectful of L side, impaired awareness of LUE    Exercises     Assessment/Plan    PT Assessment Patient needs continued PT services  PT Problem List Decreased strength;Decreased activity tolerance;Decreased balance;Decreased mobility;Decreased coordination;Decreased cognition;Decreased knowledge of use of DME;Decreased safety awareness;Decreased knowledge of precautions;Cardiopulmonary status limiting activity       PT Treatment Interventions DME instruction;Gait training;Stair training;Functional mobility training;Therapeutic activities;Therapeutic exercise;Balance training;Neuromuscular re-education;Patient/family education;Cognitive remediation    PT Goals (Current goals can be found in the Care Plan section)  Acute Rehab PT Goals Patient Stated Goal: to return to independent mobility PT Goal Formulation: With patient/family Time For Goal Achievement: 03/17/21 Potential to Achieve  Goals: Good Additional Goals Additional Goal #1: Pt will identify 4/5 objects on left side to demonstrate improved attention to left side    Frequency Min 4X/week   Barriers to discharge        Co-evaluation               AM-PAC PT "6 Clicks" Mobility  Outcome Measure Help needed turning from your back to your side while in a flat bed without using bedrails?: A Lot Help needed moving from lying on your back to sitting on the side of a flat bed without using bedrails?: A Lot Help needed moving to and from a bed to a chair (including a wheelchair)?: A Lot Help needed standing up from a chair using your arms (e.g., wheelchair or bedside chair)?: A Little Help needed to walk in hospital room?: A Lot Help needed climbing 3-5 steps with a railing? : Total 6 Click Score: 12    End of Session Equipment Utilized During Treatment: Oxygen Activity Tolerance: Patient tolerated treatment well Patient left: in chair;with call bell/phone within reach;with chair alarm set;with family/visitor present Nurse Communication: Mobility status PT Visit Diagnosis: Other abnormalities of gait and mobility (R26.89);Muscle weakness (generalized) (M62.81);Other symptoms and signs involving the nervous system (R29.898)    Time: 6606-3016 PT Time Calculation (min) (ACUTE ONLY): 48 min   Charges:   PT Evaluation $PT Eval Moderate Complexity: 1 Mod PT Treatments $Therapeutic Activity: 8-22 mins        Zenaida Niece, PT, DPT Acute Rehabilitation Pager: [phone] Office [phone]   Zenaida Niece 03/03/2021, 11:08 AM

## 2021-03-03 NOTE — Plan of Care (Signed)
  Problem: Education: Goal: Knowledge of General Education information will improve Description: Including pain rating scale, medication(s)/side effects and non-pharmacologic comfort measures Outcome: Progressing   Problem: Health Behavior/Discharge Planning: Goal: Ability to manage health-related needs will improve Outcome: Progressing   Problem: Clinical Measurements: Goal: Ability to maintain clinical measurements within normal limits will improve Outcome: Progressing Goal: Will remain free from infection Outcome: Progressing Goal: Diagnostic test results will improve Outcome: Progressing Goal: Respiratory complications will improve Outcome: Progressing Goal: Cardiovascular complication will be avoided Outcome: Progressing   Problem: Activity: Goal: Risk for activity intolerance will decrease Outcome: Progressing   Problem: Nutrition: Goal: Adequate nutrition will be maintained Outcome: Progressing   Problem: Coping: Goal: Level of anxiety will decrease Outcome: Progressing   Problem: Elimination: Goal: Will not experience complications related to bowel motility Outcome: Progressing Goal: Will not experience complications related to urinary retention Outcome: Progressing   Problem: Pain Managment: Goal: General experience of comfort will improve Outcome: Progressing   Problem: Safety: Goal: Ability to remain free from injury will improve Outcome: Progressing   Problem: Skin Integrity: Goal: Risk for impaired skin integrity will decrease Outcome: Progressing   Problem: Education: Goal: Knowledge of disease or condition will improve Outcome: Progressing Goal: Knowledge of secondary prevention will improve (SELECT ALL) Outcome: Progressing   

## 2021-03-03 NOTE — Progress Notes (Signed)
Pt has arrived to unit .Pt transferred from [facility]. Pt is being admitted following an acute stroke. Admission will be deferred until, am . No family member present to complete admission. Pt alert but drowsy. NIH score 5 upon admission assessment. Son will be here early in a.m, will defer admission to day team once son has arrived. Provider paged for further orders and update of pt status. Pt has no concerns at this time. Will continue to monitor , call bell within reach.  03/02/21 2301  Vitals  BP (!) 170/79  MAP (mmHg) 102  BP Location Right Wrist  BP Method Automatic  Patient Position (if appropriate) Lying  Pulse Rate (!) 104  Pulse Rate Source Monitor  ECG Heart Rate (!) 103  Resp 17  Level of Consciousness  Level of Consciousness Alert  MEWS COLOR  MEWS Score Color Green  Oxygen Therapy  SpO2 96 %  O2 Device Nasal Cannula  O2 Flow Rate (L/min) 2 L/min

## 2021-03-03 NOTE — Evaluation (Signed)
Occupational Therapy Evaluation Patient Details Name: [name] MRN: [id number] DOB: Apr 12, 1941 Today's Date: 03/03/2021   History of Present Illness 80 y.o. female presents to [facility] hospital on 03/02/2021 after being found down at home by son. Pt reports being unable to reach walker and experiencing a fall. CT head demonstrates R MCA stroke with R carotid artery dissection. PMH includes COPD/asthma, hypertension, rheumatoid arthritis, GERD, obstructive sleep apnea.   Clinical Impression   PTA, pt lives alone and typically Modified Independent with ADLs, household IADLs and mobility using Rollator. Pt presents now with diagnoses above and deficits in strength, coordination, sensation, proprioception, standing balance, and vision. Pt overall Mod A for side steps and basic transfers using RW, Max A for UB/LB ADLs due to deficits. Pt requires consistent cues to attend to tasks during session, unable to locate midline or scan to L visual field effectively to reach for ADL items. Pt with fair strength in L dominant UE but with spontaneous movement and poor proprioception during ADL tasks. Pt's granddaughter present during session and very supportive - encouraged further stimulation to L side/visual field. Recommend inpatient rehab based on high PLOF, significant decline from baseline and excellent rehab potential.       Recommendations for follow up therapy are one component of a multi-disciplinary discharge planning process, led by the attending physician.  Recommendations may be updated based on patient status, additional functional criteria and insurance authorization.   Follow Up Recommendations  Acute inpatient rehab (3hours/day)    Assistance Recommended at Discharge Frequent or constant Supervision/Assistance  Functional Status Assessment  Patient has had a recent decline in their functional status and demonstrates the ability to make significant improvements in function in a reasonable and  predictable amount of time.  Equipment Recommendations  BSC/3in1;Other (comment) (Rolling walker)    Recommendations for Other Services       Precautions / Restrictions Precautions Precautions: Fall Precaution Comments: L inattention/neglect Restrictions Weight Bearing Restrictions: No      Mobility Bed Mobility Overal bed mobility: Needs Assistance Bed Mobility: Supine to Sit;Sit to Supine     Supine to sit: Mod assist;HOB elevated Sit to supine: Max assist   General bed mobility comments: Cues for attention to task, able to assist moving LEs to EOB, assist to scoot but able to pull up in bed. Max A to get B LE back into bed and guide trunk    Transfers Overall transfer level: Needs assistance Equipment used: Rolling walker (2 wheels) Transfers: Sit to/from Stand Sit to Stand: Min assist     Step pivot transfers: Mod assist     General transfer comment: Min A for sit to stand from bedside with RW, assist for L UE to locate/grab RW handle. Mod A for side steps with assist for balance/coordination and to manuever RW      Balance Overall balance assessment: Needs assistance Sitting-balance support: No upper extremity supported;Feet supported Sitting balance-Leahy Scale: Good     Standing balance support: Reliant on assistive device for balance;Bilateral upper extremity supported Standing balance-Leahy Scale: Poor                             ADL either performed or assessed with clinical judgement   ADL Overall ADL's : Needs assistance/impaired Eating/Feeding: NPO   Grooming: Maximal assistance;Sitting;Brushing hair Grooming Details (indicate cue type and reason): unable to reach for comb in L field accurately, when placed in hand, pt able to reach  up to brush back of head - coordination difficulty. cues to attend to task - required assist to brush thoroughly Upper Body Bathing: Maximal assistance;Sitting   Lower Body Bathing: Maximal assistance;Sit  to/from stand   Upper Body Dressing : Maximal assistance;Sitting   Lower Body Dressing: Sit to/from stand;Total assistance   Toilet Transfer: Moderate assistance;Stand-pivot;Rolling walker (2 wheels)   Toileting- Clothing Manipulation and Hygiene: Maximal assistance;Sit to/from stand         General ADL Comments: Pt impaired primarily by poor sensation/proprioception of L UE, L inattention/neglect with visual defiicts.     Vision Baseline Vision/History: 1 Wears glasses Ability to See in Adequate Light: 2 Moderately impaired Patient Visual Report: Other (comment) (inattention/neglect) Vision Assessment?: Vision impaired- to be further tested in functional context;Yes Eye Alignment: Impaired (comment) Ocular Range of Motion: Restricted on the left;Impaired-to be further tested in functional context Alignment/Gaze Preference: Gaze right;Head turned Tracking/Visual Pursuits: Left eye does not track laterally;Left eye does not track medially;Right eye does not track medially;Right eye does not track laterally;Requires cues, head turns, or add eye shifts to track Saccades: Overshoots;Undershoots Visual Fields: Left visual field deficit;Impaired-to be further tested in functional context Depth Perception: Overshoots;Undershoots Additional Comments: R gaze preference, midline shift noted, with physical assist to turn head to midline, pt able to see objects in front, undershoots when trying to reach for items. cannot track to L side     Perception     Praxis      Pertinent Vitals/Pain Pain Assessment: Faces Faces Pain Scale: Hurts even more Pain Location: ribs Pain Descriptors / Indicators: Aching Pain Intervention(s): Monitored during session;Patient requesting pain meds-RN notified     Hand Dominance Left   Extremity/Trunk Assessment Upper Extremity Assessment Upper Extremity Assessment: LUE deficits/detail LUE Deficits / Details: L UE shoulder ROM impaired (unsure of how  much is from CVA, per pt receives shots in shoulder, likely impacted by arthritis). L grip strength, bicep/tricep strength WFL. No sensation in L UE, poor proprioception, coordination LUE Sensation: decreased light touch;decreased proprioception LUE Coordination: decreased gross motor;decreased fine motor   Lower Extremity Assessment Lower Extremity Assessment: Defer to PT evaluation LLE Deficits / Details: ROM WFL, grossly 4+/5 LLE Sensation: decreased light touch   Cervical / Trunk Assessment Cervical / Trunk Assessment: Normal   Communication Communication Communication: No difficulties   Cognition Arousal/Alertness: Awake/alert Behavior During Therapy: WFL for tasks assessed/performed Overall Cognitive Status: Impaired/Different from baseline Area of Impairment: Orientation;Attention;Memory;Following commands;Awareness;Safety/judgement;Problem solving                 Orientation Level: Disoriented to;Place (aware of hospital, but not sure which one) Current Attention Level: Sustained Memory: Decreased recall of precautions Following Commands: Follows one step commands with increased time;Follows one step commands inconsistently Safety/Judgement: Decreased awareness of deficits Awareness: Intellectual Problem Solving: Slow processing;Requires verbal cues;Difficulty sequencing General Comments: highly distractable, sharp witted, frequent cues needed to attend to tasks, aware of why she cannot have water but still requesting during session.     General Comments  VSS on baseline supplemental O2. Granddaughter present and supportive    Exercises     Shoulder Instructions      Home Living Family/patient expects to be discharged to:: Private residence Living Arrangements: Alone Available Help at Discharge: Family;Available PRN/intermittently Type of Home: House Home Access: Ramped entrance     Home Layout: One level     Bathroom Shower/Tub: Medical illustrator: Handicapped height Bathroom Accessibility: Yes   Home Equipment:  Rollator (4 wheels)   Additional Comments: home O2      Prior Functioning/Environment Prior Level of Function : Independent/Modified Independent;Driving             Mobility Comments: use of Rollator for mobility ADLs Comments: drives occasionally, Modified Independent with ADLs, household IADLs. family assists with groceries        OT Problem List: Decreased strength;Decreased range of motion;Decreased activity tolerance;Impaired balance (sitting and/or standing);Impaired vision/perception;Decreased coordination;Decreased cognition;Decreased safety awareness;Decreased knowledge of use of DME or AE;Impaired sensation;Impaired UE functional use;Pain      OT Treatment/Interventions: Self-care/ADL training;Therapeutic exercise;Energy conservation;DME and/or AE instruction;Therapeutic activities;Patient/family education;Balance training;Neuromuscular education    OT Goals(Current goals can be found in the care plan section) Acute Rehab OT Goals Patient Stated Goal: get better, decrease pain, have some water OT Goal Formulation: With patient/family Time For Goal Achievement: 03/17/21 Potential to Achieve Goals: Good  OT Frequency: Min 3X/week   Barriers to D/C:            Co-evaluation              AM-PAC OT "6 Clicks" Daily Activity     Outcome Measure Help from another person eating meals?: Total (NPO) Help from another person taking care of personal grooming?: A Lot Help from another person toileting, which includes using toliet, bedpan, or urinal?: A Lot Help from another person bathing (including washing, rinsing, drying)?: A Lot Help from another person to put on and taking off regular upper body clothing?: A Lot Help from another person to put on and taking off regular lower body clothing?: A Lot 6 Click Score: 11   End of Session Equipment Utilized During Treatment: Rolling  walker (2 wheels);Oxygen Nurse Communication: Mobility status;Patient requests pain meds  Activity Tolerance: Patient tolerated treatment well Patient left: in bed;with bed alarm set;with call bell/phone within reach;with family/visitor present  OT Visit Diagnosis: Unsteadiness on feet (R26.81);Other abnormalities of gait and mobility (R26.89);Muscle weakness (generalized) (M62.81);History of falling (Z91.81);Low vision, both eyes (H54.2);Apraxia (R48.2);Ataxia, unspecified (R27.0);Other symptoms and signs involving cognitive function;Hemiplegia and hemiparesis Hemiplegia - Right/Left: Left Hemiplegia - dominant/non-dominant: Dominant Hemiplegia - caused by: Cerebral infarction                Time: 9449-6759 OT Time Calculation (min): 41 min Charges:  OT General Charges $OT Visit: 1 Visit OT Evaluation $OT Eval Moderate Complexity: 1 Mod OT Treatments $Self Care/Home Management : 8-22 mins $Therapeutic Activity: 8-22 mins  Malachy Chamber, OTR/L Acute Rehab Services Office: [phone]   Layla Maw 03/03/2021, 12:56 PM

## 2021-03-03 NOTE — Progress Notes (Signed)
  Echocardiogram 2D Echocardiogram has been performed.  [name] 03/03/2021, 4:18 PM

## 2021-03-03 NOTE — Progress Notes (Addendum)
STROKE TEAM PROGRESS NOTE   INTERVAL HISTORY Her son and daughter in law are at the bedside.  She is sitting in the chair in NAD on [place]. She states she is having trouble breathing, sats are 95% and RR 16. No new neurological events noted overnight. She is oriented to self and age, unable to name correct hospital, states "[facility]". She does have left side neglect, left field cut, right gaze preference. All questions answered and family verbalized understanding.  Vital signs are stable.  Neurological exam is stable.  CT scan shows moderate size right MCA cortical infarct and CT angiogram showed right M2 occlusion.  CT perfusion shows small core but no significant penumbra to justify revascularization Vitals:   03/03/21 0345 03/03/21 0751 03/03/21 0902 03/03/21 1135  BP: (!) 146/76 (!) 146/61  136/84  Pulse:  (!) 104  (!) 103  Resp: 20 19  19   Temp: 98.3 F (36.8 C) 98.5 F (36.9 C)  98.3 F (36.8 C)  TempSrc: Oral Axillary  Axillary  SpO2: 99% 98% 98% 96%  Weight:      Height:       CBC:  Recent Labs  Lab 03/02/21 1158  WBC 15.8*  NEUTROABS 11.3*  HGB 12.4  HCT 38.4  MCV 101.6*  PLT 245*   Basic Metabolic Panel:  Recent Labs  Lab 03/02/21 1158  NA 141  K 4.0  CL 105  CO2 23  GLUCOSE 142*  BUN 19  CREATININE 0.94  CALCIUM 8.5*   Lipid Panel:  Recent Labs  Lab 03/03/21 0046  CHOL 154  TRIG 115  HDL 57  CHOLHDL 2.7  VLDL 23  LDLCALC 74   HgbA1c:  Recent Labs  Lab 03/03/21 0046  HGBA1C 5.4   Urine Drug Screen: No results for input(s): LABOPIA, COCAINSCRNUR, LABBENZ, AMPHETMU, THCU, LABBARB in the last 168 hours.  Alcohol Level No results for input(s): ETH in the last 168 hours.  IMAGING past 24 hours CT HEAD WO CONTRAST  Result Date: 03/02/2021 CLINICAL DATA:  Head trauma, minor (Age >= 65y); Neck trauma (Age >= 65y) EXAM: CT HEAD WITHOUT CONTRAST CT CERVICAL SPINE WITHOUT CONTRAST TECHNIQUE: Multidetector CT imaging of the head and cervical spine was  performed following the standard protocol without intravenous contrast. Multiplanar CT image reconstructions of the cervical spine were also generated. COMPARISON:  06/11/2020 FINDINGS: CT HEAD FINDINGS Brain: Large area of decreased attenuation with loss of gray-white differentiation predominantly involving the right parietal lobe suspicious for acute infarction. No evidence of intracranial hemorrhage. No extra-axial collection. Prior left basal ganglia lacunar infarct. Scattered low-density changes within the periventricular and subcortical white matter compatible with chronic microvascular ischemic change. Mild diffuse cerebral volume loss. Vascular: Atherosclerotic calcifications involving the large vessels of the skull base. 7 mm partially peripherally calcified structure in the region of the proximal right MCA, possibly reflecting a small partially calcified aneurysm sac. No unexpected hyperdense vessel. Skull: Normal. Negative for fracture or focal lesion. Sinuses/Orbits: No acute finding. Other: None. CT CERVICAL SPINE FINDINGS Alignment: Facet joints are aligned without dislocation or traumatic listhesis. Dens and lateral masses are aligned. Straightening of the cervical lordosis. Skull base and vertebrae: No acute fracture. No primary bone lesion or focal pathologic process. Soft tissues and spinal canal: No prevertebral fluid or swelling. No visible canal hematoma. Disc levels: Degenerative disc disease most pronounced at C5-6 and C6-7. Multilevel bilateral facet arthropathy. Upper chest: Negative. Other: Medial course of the bilateral common carotid arteries. Carotid atherosclerosis. IMPRESSION: 1.  Findings suggestive of acute right MCA territory infarction. Further evaluation with MRI of the brain is recommended. 2. Incidentally noted 7 mm partially peripherally calcified structure in the region of the proximal right MCA, suspicious for a small partially calcified aneurysm sac. Attention on follow-up  vascular imaging. 3. No acute fracture or traumatic listhesis of the cervical spine. 4. Degenerative disc disease and facet arthropathy of the cervical spine, most pronounced at C5-6 and C6-7. These results were called by telephone at the time of interpretation on 03/02/2021 at 1:35 pm to provider JULIE HAVILAND , who verbally acknowledged these results. Electronically Signed   By: Davina Poke D.O.   On: 03/02/2021 13:35   CT Chest Wo Contrast  Result Date: 03/02/2021 CLINICAL DATA:  Shortness of breath, fall history of COPD, asthma EXAM: CT CHEST WITHOUT CONTRAST TECHNIQUE: Multidetector CT imaging of the chest was performed following the standard protocol without IV contrast. COMPARISON:  CT chest 11/05/2019 FINDINGS: Cardiovascular: The heart is not enlarged. There is no pericardial effusion. There are dense mitral annular calcifications, mild aortic valve calcifications, three-vessel coronary artery disease and calcified atherosclerotic plaque throughout the thoracic aorta. Mediastinum/Nodes: The thyroid is unremarkable. The esophagus is grossly unremarkable. There is a small hiatal hernia. There is no mediastinal or axillary lymphadenopathy. There is no bulky hilar adenopathy. Lungs/Pleura: The trachea and central airways are patent. Moderate centrilobular and paraseptal emphysema is again seen with mild central bronchial wall thickening. Again seen is peripheral fibrotic change with areas of honeycombing more predominant in the lung bases. The findings are not significantly progressed since 11/05/2019. Patchy opacities in the lateral left base and lingula likely reflect areas of scarring or atelectasis. There is no focal consolidation or pulmonary edema. There is no pleural effusion or pneumothorax. Upper Abdomen: Contrast is seen in the right collecting system from a prior study. There are no acute findings in the imaged upper abdomen. Musculoskeletal: There acute nondisplaced fractures of the left  sixth and seventh ribs anterolaterally. IMPRESSION: 1. Acute nondisplaced fractures of the left anterolateral sixth and seventh ribs. 2. Basal prominent fibrotic change in both lungs, overall not significantly progressed since 2021. Additional patchy opacities in the lateral left base and lingula likely reflect additional areas of scar or atelectasis. 3. Dense mitral annular calcifications, aortic valve calcifications, and three-vessel coronary artery calcification. 4. Aortic Atherosclerosis (ICD10-I70.0) and Emphysema (ICD10-J43.9). Electronically Signed   By: Valetta Mole M.D.   On: 03/02/2021 16:32   CT CERVICAL SPINE WO CONTRAST  Result Date: 03/02/2021 CLINICAL DATA:  Head trauma, minor (Age >= 65y); Neck trauma (Age >= 65y) EXAM: CT HEAD WITHOUT CONTRAST CT CERVICAL SPINE WITHOUT CONTRAST TECHNIQUE: Multidetector CT imaging of the head and cervical spine was performed following the standard protocol without intravenous contrast. Multiplanar CT image reconstructions of the cervical spine were also generated. COMPARISON:  06/11/2020 FINDINGS: CT HEAD FINDINGS Brain: Large area of decreased attenuation with loss of gray-white differentiation predominantly involving the right parietal lobe suspicious for acute infarction. No evidence of intracranial hemorrhage. No extra-axial collection. Prior left basal ganglia lacunar infarct. Scattered low-density changes within the periventricular and subcortical white matter compatible with chronic microvascular ischemic change. Mild diffuse cerebral volume loss. Vascular: Atherosclerotic calcifications involving the large vessels of the skull base. 7 mm partially peripherally calcified structure in the region of the proximal right MCA, possibly reflecting a small partially calcified aneurysm sac. No unexpected hyperdense vessel. Skull: Normal. Negative for fracture or focal lesion. Sinuses/Orbits: No acute finding. Other: None. CT  CERVICAL SPINE FINDINGS Alignment:  Facet joints are aligned without dislocation or traumatic listhesis. Dens and lateral masses are aligned. Straightening of the cervical lordosis. Skull base and vertebrae: No acute fracture. No primary bone lesion or focal pathologic process. Soft tissues and spinal canal: No prevertebral fluid or swelling. No visible canal hematoma. Disc levels: Degenerative disc disease most pronounced at C5-6 and C6-7. Multilevel bilateral facet arthropathy. Upper chest: Negative. Other: Medial course of the bilateral common carotid arteries. Carotid atherosclerosis. IMPRESSION: 1. Findings suggestive of acute right MCA territory infarction. Further evaluation with MRI of the brain is recommended. 2. Incidentally noted 7 mm partially peripherally calcified structure in the region of the proximal right MCA, suspicious for a small partially calcified aneurysm sac. Attention on follow-up vascular imaging. 3. No acute fracture or traumatic listhesis of the cervical spine. 4. Degenerative disc disease and facet arthropathy of the cervical spine, most pronounced at C5-6 and C6-7. These results were called by telephone at the time of interpretation on 03/02/2021 at 1:35 pm to provider JULIE HAVILAND , who verbally acknowledged these results. Electronically Signed   By: Davina Poke D.O.   On: 03/02/2021 13:35   MR BRAIN WO CONTRAST  Result Date: 03/03/2021 CLINICAL DATA:  Acute neurologic deficit EXAM: MRI HEAD WITHOUT CONTRAST TECHNIQUE: Multiplanar, multiecho pulse sequences of the brain and surrounding structures were obtained without intravenous contrast. COMPARISON:  None. FINDINGS: Brain: Large acute infarct the posterior right MCA territory. Small focus of abnormal diffusion restriction in the left centrum semi ovale. No acute or chronic hemorrhage. There is multifocal hyperintense T2-weighted signal within the white matter. Generalized volume loss without a clear lobar predilection. Old left basal ganglia small vessel  infarct and multiple cerebellar infarcts. The midline structures are normal. Vascular: Superiorly directed supraclinoid right ICA aneurysm, as better demonstrated on earlier CTA head neck. Skull and upper cervical spine: Normal calvarium and skull base. Visualized upper cervical spine and soft tissues are normal. Sinuses/Orbits:No paranasal sinus fluid levels or advanced mucosal thickening. No mastoid or middle ear effusion. Normal orbits. IMPRESSION: 1. Large acute posterior right MCA territory infarct. No hemorrhage or mass effect. 2. Small focus of acute ischemia in the left centrum semiovale. 3. Superiorly directed supraclinoid right ICA aneurysm, as better demonstrated on earlier CTA head neck. Electronically Signed   By: Ulyses Jarred M.D.   On: 03/03/2021 03:38   CT ANGIO HEAD NECK W WO CM W PERF (CODE STROKE)  Result Date: 03/02/2021 CLINICAL DATA:  Stroke, follow up EXAM: CT ANGIOGRAPHY HEAD AND NECK CT PERFUSION BRAIN TECHNIQUE: Multidetector CT imaging of the head and neck was performed using the standard protocol during bolus administration of intravenous contrast. Multiplanar CT image reconstructions and MIPs were obtained to evaluate the vascular anatomy. Carotid stenosis measurements (when applicable) are obtained utilizing NASCET criteria, using the distal internal carotid diameter as the denominator. Multiphase CT imaging of the brain was performed following IV bolus contrast injection. Subsequent parametric perfusion maps were calculated using RAPID software. CONTRAST:  138mL OMNIPAQUE IOHEXOL 350 MG/ML SOLN COMPARISON:  None. FINDINGS: CTA NECK FINDINGS Aorta: Calcific atherosclerosis.  Great vessel origins are patent. Right carotid system: Common carotid artery is patent. Calcific atherosclerosis at the carotid bifurcation with approximately 40% stenosis. Dissection of the right internal carotid artery at the skull base with visible intimal flap (series 9, images 146 through 156; series 10,  images 122 through 126). Mild dilation in this region, compatible with small pseudoaneurysm. Both the false and true lumens are  patent. Retropharyngeal course. Left carotid system: Carotid bifurcation atherosclerosis without greater than 50% stenosis. Mild irregularity of the ICA at the skull base, probably atherosclerotic given no intimal flap to suggest dissection. The ICAs tortuous at the skull base. Retropharyngeal course. Vertebral arteries: Mildly right dominant. Patent. Mild atherosclerotic irregularity bilaterally. Skeleton: Multilevel degenerative change of the cervical spine. Other neck: No acute abnormality. Upper chest: Emphysema. No consolidation visualized lung apices. Biapical pleuroparenchymal scarring. Review of the MIP images confirms the above findings CTA HEAD FINDINGS Anterior circulation: Bilateral intracranial ICAs are patent with mild atherosclerotic narrowing. Bilateral M1 MCAs are patent. Occlusion of the proximal right M2 MCA. 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 Motion limited evaluation of the ACAs and distal MCAs. The ACAs appear to be grossly patent. Posterior circulation: Dominant right intradural vertebral artery. Bilateral intradural vertebral artery, basilar artery, and posterior cerebral arteries are patent without proximal high-grade stenosis. Prominent right posterior communicating artery with small right P1 PCA, anatomic variant. Motion limited evaluation of the PCAs. Venous sinuses: As permitted by contrast timing, patent. Anatomic variants: Detailed above. CT Brain Perfusion Findings: CBF (<30%) Volume: 22mL Perfusion (Tmax>6.0s) volume: 35mL. Possibly overestimated given probably artifactual T-max greater than 6 seconds in the left anterior and middle cranial fossa. Mismatch Volume: 48mL Infarction Location:Posterior right MCA territory. IMPRESSION: 1. Acute right M2 MCA occlusion. 2. Dissection of the right internal  carotid artery at the skull base with visible intimal flap, small pseudoaneurysm, and patent false and true lumens. 3. On CT perfusion, largely completed right distal MCA territory infarct with 55 mL core infarct and 11 mL penumbra. Penumbra may be overestimated given probably artifactual T-max greater than 6 seconds in the left anterior and anterior middle cranial fossa. 4. 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 5. Bilateral carotid bifurcation atherosclerosis with approximately 40% stenosis of the proximal right ICA. 6. Aortic Atherosclerosis (ICD10-I70.0) and Emphysema (ICD10-J43.9). LVO, perfusion findings, and aneurysm discussed with Dr. Rory Percy 2:25 PM via telephone. Dissection discussed at 2:35 p.m. via telephone. Electronically Signed   By: Margaretha Sheffield M.D.   On: 03/02/2021 14:57    PHYSICAL EXAM  Temp:  [98.3 F (36.8 C)-99.5 F (37.5 C)] 98.3 F (36.8 C) (11/19 1135) Pulse Rate:  [54-115] 103 (11/19 1135) Resp:  [15-30] 19 (11/19 1135) BP: (48-179)/(31-154) 136/84 (11/19 1135) SpO2:  [80 %-100 %] 96 % (11/19 1135) FiO2 (%):  [28 %] 28 % (11/19 0902) Weight:  [122.5 kg] 122.5 kg (11/18 2301)  General -obese elderly Caucasian lady, in no apparent distress on [place] @ 3LPM. Wears 3 L at home.  Ophthalmologic - fundi not visualized due to noncooperation.  Cardiovascular - Regular rhythm and rate.  Mental Status -  Level of arousal and orientation x3. Unable to name correct Hospital Language including expression, naming, repetition, comprehension was assessed and found intact.  Mild left neglect with right gaze and head turning Attention span and concentration were normal. Recent and remote memory were intact. Fund of Knowledge was assessed and was intact.  Cranial Nerves II - XII - II - left field cut. III, IV, VI - Right gaze preference. Left eye with saccadic movements on left gaze V - Facial sensation intact bilaterally. VII - Facial movement intact  bilaterally. VIII - Hearing & vestibular intact bilaterally. X - Palate elevates symmetrically. XI - Chin turning & shoulder shrug intact bilaterally. XII - Tongue protrusion intact.  Motor Strength - RUE and RLE 5/5, LUE  and LLE 4/5 Bulk was normal and fasciculations were absent.  Diminished fine finger movements on the left.  Left grip weakness.  Onset of left upper extremity. Motor Tone - Muscle tone was assessed at the neck and appendages and was normal.  Sensory - decreased sensation on left side    Coordination - The patient had normal movements in the hands and feet with no ataxia or dysmetria.  Tremor was absent.  Gait and Station - deferred.   ASSESSMENT/PLAN [name] is a 80 y.o. female with history of 80 year old past medical history of COPD, GERD, hypercholesterolemia, migraine, presented to the emergency room and son found her down on the floor.  Unclear last known well but to the best of her ability last known well somewhere around 10 PM yesterday when she went to bed.  This morning she was found on the floor by son.  She does not remember when she got up or what she did after getting up.  She could not move the left side of her body but it was thought that she fell on that side and could not move  Stroke:  Acute right MCA infarct embolic secondary to possible Right ICA petrous segment dissection with right M2 occlusion etiology possibly trauma from the fall CT head  1. Findings suggestive of acute right MCA territory infarction. Further evaluation with MRI of the brain is recommended. 2. Incidentally noted 7 mm partially peripherally calcified structure in the region of the proximal right MCA, suspicious for a small partially calcified aneurysm sac. Attention on follow-up vascular imaging. 3. No acute fracture or traumatic listhesis of the cervical spine. 4. Degenerative disc disease and facet arthropathy of the cervical spine, most pronounced at C5-6 and C6-7.  CTA  head & neck  1. Acute right M2 MCA occlusion. 2. 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 3. On CT perfusion, largely completed right distal MCA territory infarct with 55 mL core infarct and 11 mL penumbra. Penumbra may be overestimated given probably artifactual T-max greater than 6 seconds in the left anterior and anterior middle cranial fossa. 4. 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 5. Bilateral carotid bifurcation atherosclerosis with approximately 40% stenosis of the proximal right ICA. 6. Aortic Atherosclerosis (ICD10-I70.0) and Emphysema   CT perfusion  largely completed right distal MCA territory infarct with 55 mL core infarct and 11 mL penumbra  MRI   1. Large acute posterior right MCA territory infarct. No hemorrhage or mass effect. 2. Small focus of acute ischemia in the left centrum semiovale. 3. Superiorly directed supraclinoid right ICA aneurysm, as better demonstrated on earlier CTA head neck  2D Echo ordered EKG ordered May need a loop after D/c LDL 74 HgbA1c 5.4 VTE prophylaxis - SCD's    Diet   Diet NPO time specified   No antithrombotic prior to admission, now on aspirin 81 mg daily and clopidogrel 75 mg daily.  Therapy recommendations:  CIR Disposition:  pending  Hypertension Home meds:  norvasc, lisinopril Stable Permissive hypertension (OK if < 220/120) but gradually normalize in 5-7 days Long-term BP goal normotensive  Hyperlipidemia Home meds:  crestor 5 mg  resumed in hospital, now on 20mg   LDL 74, goal < 70 Continue statin at discharge  Other Stroke Risk Factors Advanced Age >/= 13  Former Cigarette smoker Obesity, Body mass index is 42.3 kg/m., BMI >/=  30 associated with increased stroke risk, recommend weight loss, diet and exercise as appropriate  Obstructive Sleep Apnea  Other Active Problems COPD on  home Oxygen GERD UTI on Ceftriaxone RLS  Hospital day # 1  Beulah Gandy, NP  STROKE MD NOTE ; I have personally obtained history,examined this patient, reviewed notes, independently viewed imaging studies, participated in medical decision making and plan of care.ROS completed by me personally and pertinent positives fully documented  I have made any additions or clarifications directly to the above note. Agree with note above.  Patient presented with a fall and subsequently was found to have mild left-sided weakness with neglect and CT scan shows right MCA branch infarct with CTA showing right M2 occlusion.  Patient had small cold but did not have significant penumbra to justify thrombectomy.  CT angiogram shows dissection of the petrous right carotid artery but with distal flow.  Recommend dual antiplatelet therapy aspirin and Plavix for 3 months followed by aspirin alone and aggressive risk factor modification.  Mobilize out of bed.  Therapy and rehab consults.  Long discussion with patient and son about MRI and answered questions.  Discussed with Dr. Jerald Kief.  Greater than 50% time during this 35-minute visit was spent on counseling and coordination of care and discussion with care team and answering questions.  Antony Contras, MD Medical Director [place] Pager: [phone] 03/03/2021 4:24 PM  To contact Stroke Continuity provider, please refer to [email or web address] After hours, contact General Neurology

## 2021-03-03 NOTE — Progress Notes (Signed)
Received report from [facility] ED nurse Dellis Filbert, [place]. Pt is in transport.

## 2021-03-04 ENCOUNTER — Inpatient Hospital Stay (HOSPITAL_COMMUNITY): Payer: PPO

## 2021-03-04 DIAGNOSIS — R7989 Other specified abnormal findings of blood chemistry: Secondary | ICD-10-CM

## 2021-03-04 LAB — TSH: TSH: 1.735 u[IU]/mL (ref 0.350–4.500)

## 2021-03-04 LAB — BASIC METABOLIC PANEL
Anion gap: 9 (ref 5–15)
BUN: 15 mg/dL (ref 8–23)
CO2: 24 mmol/L (ref 22–32)
Calcium: 8.4 mg/dL — ABNORMAL LOW (ref 8.9–10.3)
Chloride: 104 mmol/L (ref 98–111)
Creatinine, Ser: 1.01 mg/dL — ABNORMAL HIGH (ref 0.44–1.00)
GFR, Estimated: 56 mL/min — ABNORMAL LOW (ref >60)
Glucose, Bld: 121 mg/dL — ABNORMAL HIGH (ref 70–99)
Potassium: 4.2 mmol/L (ref 3.5–5.1)
Sodium: 137 mmol/L (ref 135–145)

## 2021-03-04 LAB — CBC
HCT: 34.2 % — ABNORMAL LOW (ref 36.0–46.0)
Hemoglobin: 11.2 g/dL — ABNORMAL LOW (ref 12.0–15.0)
MCH: 32.4 pg (ref 26.0–34.0)
MCHC: 32.7 g/dL (ref 30.0–36.0)
MCV: 98.8 fL (ref 80.0–100.0)
Platelets: 126 10*3/uL — ABNORMAL LOW (ref 150–400)
RBC: 3.46 MIL/uL — ABNORMAL LOW (ref 3.87–5.11)
RDW: 13.4 % (ref 11.5–15.5)
WBC: 18.1 10*3/uL — ABNORMAL HIGH (ref 4.0–10.5)
nRBC: 0 % (ref 0.0–0.2)

## 2021-03-04 LAB — D-DIMER, QUANTITATIVE: D-Dimer, Quant: 2.34 ug/mL-FEU — ABNORMAL HIGH (ref 0.00–0.50)

## 2021-03-04 LAB — TROPONIN I (HIGH SENSITIVITY): Troponin I (High Sensitivity): 66 ng/L — ABNORMAL HIGH (ref <18)

## 2021-03-04 LAB — GLUCOSE, CAPILLARY: Glucose-Capillary: 93 mg/dL (ref 70–99)

## 2021-03-04 LAB — MAGNESIUM: Magnesium: 1.8 mg/dL (ref 1.7–2.4)

## 2021-03-04 MED ORDER — METOPROLOL TARTRATE 25 MG PO TABS
25.0000 mg | ORAL_TABLET | Freq: Two times a day (BID) | ORAL | Status: DC
  Administered 2021-03-04 (×2): 25 mg via ORAL
  Filled 2021-03-04 (×2): qty 1

## 2021-03-04 MED ORDER — HYDROCODONE-ACETAMINOPHEN 5-325 MG PO TABS
1.0000 | ORAL_TABLET | ORAL | Status: DC | PRN
  Administered 2021-03-04 (×2): 2 via ORAL
  Administered 2021-03-05: 1 via ORAL
  Administered 2021-03-05 (×2): 2 via ORAL
  Administered 2021-03-05: 1 via ORAL
  Administered 2021-03-06: 2 via ORAL
  Administered 2021-03-06: 1 via ORAL
  Administered 2021-03-06 – 2021-03-07 (×4): 2 via ORAL
  Filled 2021-03-04: qty 1
  Filled 2021-03-04 (×3): qty 2
  Filled 2021-03-04: qty 1
  Filled 2021-03-04 (×4): qty 2
  Filled 2021-03-04: qty 1
  Filled 2021-03-04 (×3): qty 2

## 2021-03-04 MED ORDER — SODIUM CHLORIDE 0.9 % IV SOLN: INTRAVENOUS | Status: DC

## 2021-03-04 MED ORDER — APIXABAN 5 MG PO TABS
5.0000 mg | ORAL_TABLET | Freq: Two times a day (BID) | ORAL | Status: DC
  Administered 2021-03-04 – 2021-03-09 (×10): 5 mg via ORAL
  Filled 2021-03-04 (×10): qty 1

## 2021-03-04 MED ORDER — IOHEXOL 350 MG/ML SOLN
75.0000 mL | Freq: Once | INTRAVENOUS | Status: AC | PRN
  Administered 2021-03-04: 75 mL via INTRAVENOUS

## 2021-03-04 NOTE — Progress Notes (Signed)
ANTICOAGULATION CONSULT NOTE - Initial Consult  Pharmacy Consult for apixaban dosing Indication: atrial fibrillation  Allergies  Allergen Reactions   Shellfish Allergy Anaphylaxis    With vomiting and diarrhea   Atorvastatin Other (See Comments)    Leg muscle pain   Sertraline Hcl     Unknown reaction   Macrolides And Ketolides Rash   Penicillins Other (See Comments)    Facial numbness   Sulfa Antibiotics Other (See Comments)    Facial numbness    Patient Measurements: Height: 5\' 7"  (170.2 cm) Weight: 122.5 kg (270 lb 1 oz) IBW/kg (Calculated) : 61.6  Vital Signs: Temp: 98 F (36.7 C) (11/20 0733) Temp Source: Oral (11/20 0733) BP: 146/68 (11/20 0733) Pulse Rate: 103 (11/20 0733)  Labs: Recent Labs    03/02/21 1158 03/02/21 1351 03/04/21 0647 03/04/21 0708  HGB 12.4  --   --  11.2*  HCT 38.4  --   --  34.2*  PLT 135*  --   --  126*  LABPROT 12.5  --   --   --   INR 0.9  --   --   --   CREATININE 0.94  --  1.01*  --   TROPONINIHS 19* 22* 66*  --     Estimated Creatinine Clearance: 59.3 mL/min (A) (by C-G formula based on SCr of 1.01 mg/dL (H)).   Medical History: Past Medical History:  Diagnosis Date   Allergic rhinitis    Asthma    Asthmatic bronchitis    Colon polyps    COPD (chronic obstructive pulmonary disease) (HCC)    Diverticulosis    GERD (gastroesophageal reflux disease)    H/O: GI bleed    Hypercholesterolemia    Migraine headache    Obesity    RLS (restless legs syndrome)     Medications:  See MAR  Assessment: Stroke patient with no prior history of atrial fibrillation found to have atrial fibrillation during this admission. Neuro plans to transition from asa and Plavix to Eliquis. CBC is stable and there are no documented signs of bleeding. >40 years old, but SCr <1.5 and >60 kg, so 5 mg BID dosing.   Plan:  Start apixaban 5 mg BID  Monitor CBC and signs of bleeding F/u DC anticoag plan & TOC needs, conduct education if DC on  apixaban  Donald Pore 03/04/2021,1:42 PM

## 2021-03-04 NOTE — Progress Notes (Signed)
PROGRESS NOTE    [name]  GLO:[id number] DOB: Feb 29, 1941 DOA: 03/02/2021 PCP: Lajean Manes, MD   Brief Narrative: 80 year old with past medical history significant for COPD/asthma, hypertension, rheumatoid arthritis, GERD, obstructive sleep apnea who was found down on the floor by her son earlier the day of admission 03/02/2021.  Patient was last known well around 10 PM tonight prior to admission when she went to bed.  Patient does not remember when she got up, but stated she got up in order to use the bathroom but was unable to get to her walker and fell.  She reported having difficulty moving the left side of her body.  Son tried to get in touch with her and she did not answer, he broke into her house and found her on the ground.  Evaluation in the ED CT head showed right MCA stroke with right carotid artery dissection.   Assessment & Plan:   Principal Problem:   Acute ischemic right MCA stroke (HCC) Active Problems:   COPD mixed type (HCC)   Rheumatoid arthritis ([place])   Acute lower UTI   Essential hypertension   OSA on CPAP   Dissection of right carotid artery (HCC)   Rib fractures   1-Acute Right MCA Stroke:  Likely embolic secondary to possible right ICA dissection with right M2 occlusion and small vessel disease source. MRI: Large acute posterior right MCA territory infarct.  Small focus of acute ischemia in the left centrum semiovale old.  Superiorly directed supraclinoid right ICA aneurysm. CTA: Acute right M2 MCA occlusion.  Dissection of the right internal carotid artery at the skull base with visible intimal flap, a small pseudoaneurysm and patent false and true lumens.  Superiorly 80 x 10 mm right supraclinoid ICA aneurysmal.  Bilateral carotid bifurcation atherosclerosis with 40% stenosis of the proximal right ICA. LDL: 74 A1c: 0.4 2D echo: EF 65 %. Grade 1 Diastolic Dysfunction.  PT recommended CIR Develop A fib RVR last night. Ok to start Eliquis per neurology.  Stop aspirin and plavix.   2-Right Carotid Artery dissection:  Neurology discussed with neuro interventionist.  No thrombectomy due to very high risk for hemorrhagic transformation the dissection does not appear to be flow-limiting or any sort of thrombus large.  Due to absence of any thrombus no need for IV heparin.  Further management per neurology  COPD: Possible exacerbation Patient report worsening shortness of breath.  She is not able to take deep breaths. Of shortness of breath related to rib fracture.  With we will proceed with IVF steroids, scheduled nebulizer -CT chest 11/18: Patient with prominent fibrotic changes in both lung, overall not significantly progressed since 2021.  Patchy opacity in the lateral left base and lingula reflect additional areas of scar or atelectasis. -On Breo-incruse.  -On chronic prednisone.   Positive D dimer:  -Doppler LE negative for DVT.  -CTA pending.   A fib RVR;  Started Metoprolol for rate controlled.  Ok to start Eliquis per neurology./   Anterior Rib Fx:  Patient complaining of rib pain and shortness of breath.  Continue with lidocaine patch, change Vicodin to Q 4 HR PRN Incentive spirometry.  UTI; Presented with UA with more than 50 white blood cell positive nitrate Follow urine culture Continue with IV ceftriaxone   HTN: Permissive hypertension normalizing 5 to 7 days Holding Norvasc and lisinopril Start Metoprolol for A fib.    RA; On Plaquenil, Arava.   OSA on CPAP:  CPAP ordered.  Estimated body mass index is 42.3 kg/m as calculated from the following:   Height as of this encounter: 5\' 7"  (1.702 m).   Weight as of this encounter: 122.5 kg.   DVT prophylaxis: SCD Code Status: DNR, discussed with son who was at bedside.  Family Communication: Son at bedside updated.  Disposition Plan:  Status is: Inpatient  Remains inpatient appropriate because: admitted with stroke.     Consultants:  Neurology    Procedures:  ECHO pending  Antimicrobials:  Ceftriaxone  Subjective: Still complaining of Chest pain.  Breathing better. Left side neglect.   Objective: Vitals:   03/03/21 2330 03/04/21 0355 03/04/21 0733 03/04/21 0758  BP: 130/68 (!) 154/60 (!) 146/68   Pulse: 91 (!) 103 (!) 103   Resp: (!) 25 20 20    Temp: 97.9 F (36.6 C) 97.7 F (36.5 C) 98 F (36.7 C)   TempSrc: Oral Oral Oral   SpO2: 92% 94% 90% 93%  Weight:      Height:        Intake/Output Summary (Last 24 hours) at 03/04/2021 1043 Last data filed at 03/03/2021 2002 Gross per 24 hour  Intake --  Output 500 ml  Net -500 ml    Filed Weights   03/02/21 2301  Weight: 122.5 kg    Examination:  General exam: NAD Respiratory system: No wheezing.  Cardiovascular system: S 1, S 2 IRR Gastrointestinal system: BS present, soft nt Central nervous system: alert, follows command left side neglect Extremities: no edema    Data Reviewed: I have personally reviewed following labs and imaging studies  CBC: Recent Labs  Lab 03/02/21 1158 03/04/21 0708  WBC 15.8* 18.1*  NEUTROABS 11.3*  --   HGB 12.4 11.2*  HCT 38.4 34.2*  MCV 101.6* 98.8  PLT 135* 126*    Basic Metabolic Panel: Recent Labs  Lab 03/02/21 1158 03/04/21 0647  NA 141 137  K 4.0 4.2  CL 105 104  CO2 23 24  GLUCOSE 142* 121*  BUN 19 15  CREATININE 0.94 1.01*  CALCIUM 8.5* 8.4*  MG  --  1.8    GFR: Estimated Creatinine Clearance: 59.3 mL/min (A) (by C-G formula based on SCr of 1.01 mg/dL (H)). Liver Function Tests: Recent Labs  Lab 03/02/21 1158  AST 30  ALT 17  ALKPHOS 63  BILITOT 0.9  PROT 6.7  ALBUMIN 3.6    No results for input(s): LIPASE, AMYLASE in the last 168 hours. No results for input(s): AMMONIA in the last 168 hours. Coagulation Profile: Recent Labs  Lab 03/02/21 1158  INR 0.9    Cardiac Enzymes: No results for input(s): CKTOTAL, CKMB, CKMBINDEX, TROPONINI in the last 168 hours. BNP (last 3  results) No results for input(s): PROBNP in the last 8760 hours. HbA1C: Recent Labs    03/03/21 0046  HGBA1C 5.4    CBG: Recent Labs  Lab 03/02/21 1206 03/03/21 2003  GLUCAP 141* 138*    Lipid Profile: Recent Labs    03/03/21 0046  CHOL 154  HDL 57  LDLCALC 74  TRIG 115  CHOLHDL 2.7    Thyroid Function Tests: Recent Labs    03/04/21 0647  TSH 1.735   Anemia Panel: No results for input(s): VITAMINB12, FOLATE, FERRITIN, TIBC, IRON, RETICCTPCT in the last 72 hours. Sepsis Labs: No results for input(s): PROCALCITON, LATICACIDVEN in the last 168 hours.  Recent Results (from the past 240 hour(s))  Resp Panel by RT-PCR (Flu A&B, Covid) Nasopharyngeal Swab  Status: None   Collection Time: 03/02/21  3:30 PM   Specimen: Nasopharyngeal Swab; Nasopharyngeal(NP) swabs in vial transport medium  Result Value Ref Range Status   SARS Coronavirus 2 by RT PCR NEGATIVE NEGATIVE Final    Comment: (NOTE) SARS-CoV-2 target nucleic acids are NOT DETECTED.  The SARS-CoV-2 RNA is generally detectable in upper respiratory specimens during the acute phase of infection. The lowest concentration of SARS-CoV-2 viral copies this assay can detect is 138 copies/mL. A negative result does not preclude SARS-Cov-2 infection and should not be used as the sole basis for treatment or other patient management decisions. A negative result may occur with  improper specimen collection/handling, submission of specimen other than nasopharyngeal swab, presence of viral mutation(s) within the areas targeted by this assay, and inadequate number of viral copies(<138 copies/mL). A negative result must be combined with clinical observations, patient history, and epidemiological information. The expected result is Negative.  Fact Sheet for Patients:  EntrepreneurPulse.com.au  Fact Sheet for Healthcare Providers:  IncredibleEmployment.be  This test is no t yet  approved or cleared by the [place] FDA and  has been authorized for detection and/or diagnosis of SARS-CoV-2 by FDA under an Emergency Use Authorization (EUA). This EUA will remain  in effect (meaning this test can be used) for the duration of the COVID-19 declaration under Section 564(b)(1) of the Act, 21 U.S.C.section 360bbb-3(b)(1), unless the authorization is terminated  or revoked sooner.       Influenza A by PCR NEGATIVE NEGATIVE Final   Influenza B by PCR NEGATIVE NEGATIVE Final    Comment: (NOTE) The Xpert Xpress SARS-CoV-2/FLU/RSV plus assay is intended as an aid in the diagnosis of influenza from Nasopharyngeal swab specimens and should not be used as a sole basis for treatment. Nasal washings and aspirates are unacceptable for Xpert Xpress SARS-CoV-2/FLU/RSV testing.  Fact Sheet for Patients: EntrepreneurPulse.com.au  Fact Sheet for Healthcare Providers: IncredibleEmployment.be  This test is not yet approved or cleared by the [place] FDA and has been authorized for detection and/or diagnosis of SARS-CoV-2 by FDA under an Emergency Use Authorization (EUA). This EUA will remain in effect (meaning this test can be used) for the duration of the COVID-19 declaration under Section 564(b)(1) of the Act, 21 U.S.C. section 360bbb-3(b)(1), unless the authorization is terminated or revoked.  Performed at [facility], [address], [place], [place] [address]           Radiology Studies: CT HEAD WO CONTRAST  Result Date: 03/02/2021 CLINICAL DATA:  Head trauma, minor (Age >= 65y); Neck trauma (Age >= 65y) EXAM: CT HEAD WITHOUT CONTRAST CT CERVICAL SPINE WITHOUT CONTRAST TECHNIQUE: Multidetector CT imaging of the head and cervical spine was performed following the standard protocol without intravenous contrast. Multiplanar CT image reconstructions of the cervical spine were also generated. COMPARISON:  06/11/2020 FINDINGS: CT  HEAD FINDINGS Brain: Large area of decreased attenuation with loss of gray-white differentiation predominantly involving the right parietal lobe suspicious for acute infarction. No evidence of intracranial hemorrhage. No extra-axial collection. Prior left basal ganglia lacunar infarct. Scattered low-density changes within the periventricular and subcortical white matter compatible with chronic microvascular ischemic change. Mild diffuse cerebral volume loss. Vascular: Atherosclerotic calcifications involving the large vessels of the skull base. 7 mm partially peripherally calcified structure in the region of the proximal right MCA, possibly reflecting a small partially calcified aneurysm sac. No unexpected hyperdense vessel. Skull: Normal. Negative for fracture or focal lesion. Sinuses/Orbits: No acute finding. Other: None. CT CERVICAL  SPINE FINDINGS Alignment: Facet joints are aligned without dislocation or traumatic listhesis. Dens and lateral masses are aligned. Straightening of the cervical lordosis. Skull base and vertebrae: No acute fracture. No primary bone lesion or focal pathologic process. Soft tissues and spinal canal: No prevertebral fluid or swelling. No visible canal hematoma. Disc levels: Degenerative disc disease most pronounced at C5-6 and C6-7. Multilevel bilateral facet arthropathy. Upper chest: Negative. Other: Medial course of the bilateral common carotid arteries. Carotid atherosclerosis. IMPRESSION: 1. Findings suggestive of acute right MCA territory infarction. Further evaluation with MRI of the brain is recommended. 2. Incidentally noted 7 mm partially peripherally calcified structure in the region of the proximal right MCA, suspicious for a small partially calcified aneurysm sac. Attention on follow-up vascular imaging. 3. No acute fracture or traumatic listhesis of the cervical spine. 4. Degenerative disc disease and facet arthropathy of the cervical spine, most pronounced at C5-6 and  C6-7. These results were called by telephone at the time of interpretation on 03/02/2021 at 1:35 pm to provider JULIE HAVILAND , who verbally acknowledged these results. Electronically Signed   By: Davina Poke D.O.   On: 03/02/2021 13:35   CT Chest Wo Contrast  Result Date: 03/02/2021 CLINICAL DATA:  Shortness of breath, fall history of COPD, asthma EXAM: CT CHEST WITHOUT CONTRAST TECHNIQUE: Multidetector CT imaging of the chest was performed following the standard protocol without IV contrast. COMPARISON:  CT chest 11/05/2019 FINDINGS: Cardiovascular: The heart is not enlarged. There is no pericardial effusion. There are dense mitral annular calcifications, mild aortic valve calcifications, three-vessel coronary artery disease and calcified atherosclerotic plaque throughout the thoracic aorta. Mediastinum/Nodes: The thyroid is unremarkable. The esophagus is grossly unremarkable. There is a small hiatal hernia. There is no mediastinal or axillary lymphadenopathy. There is no bulky hilar adenopathy. Lungs/Pleura: The trachea and central airways are patent. Moderate centrilobular and paraseptal emphysema is again seen with mild central bronchial wall thickening. Again seen is peripheral fibrotic change with areas of honeycombing more predominant in the lung bases. The findings are not significantly progressed since 11/05/2019. Patchy opacities in the lateral left base and lingula likely reflect areas of scarring or atelectasis. There is no focal consolidation or pulmonary edema. There is no pleural effusion or pneumothorax. Upper Abdomen: Contrast is seen in the right collecting system from a prior study. There are no acute findings in the imaged upper abdomen. Musculoskeletal: There acute nondisplaced fractures of the left sixth and seventh ribs anterolaterally. IMPRESSION: 1. Acute nondisplaced fractures of the left anterolateral sixth and seventh ribs. 2. Basal prominent fibrotic change in both lungs,  overall not significantly progressed since 2021. Additional patchy opacities in the lateral left base and lingula likely reflect additional areas of scar or atelectasis. 3. Dense mitral annular calcifications, aortic valve calcifications, and three-vessel coronary artery calcification. 4. Aortic Atherosclerosis (ICD10-I70.0) and Emphysema (ICD10-J43.9). Electronically Signed   By: Valetta Mole M.D.   On: 03/02/2021 16:32   CT CERVICAL SPINE WO CONTRAST  Result Date: 03/02/2021 CLINICAL DATA:  Head trauma, minor (Age >= 65y); Neck trauma (Age >= 65y) EXAM: CT HEAD WITHOUT CONTRAST CT CERVICAL SPINE WITHOUT CONTRAST TECHNIQUE: Multidetector CT imaging of the head and cervical spine was performed following the standard protocol without intravenous contrast. Multiplanar CT image reconstructions of the cervical spine were also generated. COMPARISON:  06/11/2020 FINDINGS: CT HEAD FINDINGS Brain: Large area of decreased attenuation with loss of gray-white differentiation predominantly involving the right parietal lobe suspicious for acute infarction. No evidence of intracranial  hemorrhage. No extra-axial collection. Prior left basal ganglia lacunar infarct. Scattered low-density changes within the periventricular and subcortical white matter compatible with chronic microvascular ischemic change. Mild diffuse cerebral volume loss. Vascular: Atherosclerotic calcifications involving the large vessels of the skull base. 7 mm partially peripherally calcified structure in the region of the proximal right MCA, possibly reflecting a small partially calcified aneurysm sac. No unexpected hyperdense vessel. Skull: Normal. Negative for fracture or focal lesion. Sinuses/Orbits: No acute finding. Other: None. CT CERVICAL SPINE FINDINGS Alignment: Facet joints are aligned without dislocation or traumatic listhesis. Dens and lateral masses are aligned. Straightening of the cervical lordosis. Skull base and vertebrae: No acute  fracture. No primary bone lesion or focal pathologic process. Soft tissues and spinal canal: No prevertebral fluid or swelling. No visible canal hematoma. Disc levels: Degenerative disc disease most pronounced at C5-6 and C6-7. Multilevel bilateral facet arthropathy. Upper chest: Negative. Other: Medial course of the bilateral common carotid arteries. Carotid atherosclerosis. IMPRESSION: 1. Findings suggestive of acute right MCA territory infarction. Further evaluation with MRI of the brain is recommended. 2. Incidentally noted 7 mm partially peripherally calcified structure in the region of the proximal right MCA, suspicious for a small partially calcified aneurysm sac. Attention on follow-up vascular imaging. 3. No acute fracture or traumatic listhesis of the cervical spine. 4. Degenerative disc disease and facet arthropathy of the cervical spine, most pronounced at C5-6 and C6-7. These results were called by telephone at the time of interpretation on 03/02/2021 at 1:35 pm to provider JULIE HAVILAND , who verbally acknowledged these results. Electronically Signed   By: Davina Poke D.O.   On: 03/02/2021 13:35   MR BRAIN WO CONTRAST  Result Date: 03/03/2021 CLINICAL DATA:  Acute neurologic deficit EXAM: MRI HEAD WITHOUT CONTRAST TECHNIQUE: Multiplanar, multiecho pulse sequences of the brain and surrounding structures were obtained without intravenous contrast. COMPARISON:  None. FINDINGS: Brain: Large acute infarct the posterior right MCA territory. Small focus of abnormal diffusion restriction in the left centrum semi ovale. No acute or chronic hemorrhage. There is multifocal hyperintense T2-weighted signal within the white matter. Generalized volume loss without a clear lobar predilection. Old left basal ganglia small vessel infarct and multiple cerebellar infarcts. The midline structures are normal. Vascular: Superiorly directed supraclinoid right ICA aneurysm, as better demonstrated on earlier CTA head  neck. Skull and upper cervical spine: Normal calvarium and skull base. Visualized upper cervical spine and soft tissues are normal. Sinuses/Orbits:No paranasal sinus fluid levels or advanced mucosal thickening. No mastoid or middle ear effusion. Normal orbits. IMPRESSION: 1. Large acute posterior right MCA territory infarct. No hemorrhage or mass effect. 2. Small focus of acute ischemia in the left centrum semiovale. 3. Superiorly directed supraclinoid right ICA aneurysm, as better demonstrated on earlier CTA head neck. Electronically Signed   By: Ulyses Jarred M.D.   On: 03/03/2021 03:38   DG Chest Port 1 View  Result Date: 03/02/2021 CLINICAL DATA:  Short of breath and chest pain EXAM: PORTABLE CHEST 1 VIEW COMPARISON:  06/11/2020 FINDINGS: Cardiac enlargement without heart failure. Atherosclerotic calcification aortic arch. Mild bibasilar atelectasis similar to prior study. No significant pleural effusion. IMPRESSION: Mild bibasilar atelectasis. Negative for heart failure. Underlying COPD. Electronically Signed   By: Franchot Gallo M.D.   On: 03/02/2021 12:46   ECHOCARDIOGRAM COMPLETE  Result Date: 03/03/2021    ECHOCARDIOGRAM REPORT   Patient Name:   Ritisha C Benner Date of Exam: 03/03/2021 Medical Rec #:  [id number]   Height:  67.0 in Accession #:    [id number]  Weight:       270.1 lb Date of Birth:  [birth year]   BSA:          2.298 m Patient Age:    47 years    BP:           136/84 mmHg Patient Gender: F           HR:           101 bpm. Exam Location:  Inpatient Procedure: 2D Echo Indications:    stroke  History:        Patient has no prior history of Echocardiogram examinations.                 Right carotid dissection, COPD; Risk Factors:Hypertension and                 Sleep Apnea.  Sonographer:    Johny Chess RDCS Referring Phys: [place]  1. Left ventricular ejection fraction, by estimation, is 60 to 65%. The left ventricle has normal function. The left ventricle  has no regional wall motion abnormalities. There is mild left ventricular hypertrophy. Left ventricular diastolic parameters are consistent with Grade I diastolic dysfunction (impaired relaxation).  2. Right ventricular systolic function is normal. The right ventricular size is normal.  3. Left atrial size was mildly dilated.  4. The mitral valve is normal in structure. No evidence of mitral valve regurgitation. No evidence of mitral stenosis. Moderate mitral annular calcification.  5. The aortic valve is tricuspid. Aortic valve regurgitation is mild. Aortic valve sclerosis is present, with no evidence of aortic valve stenosis.  6. Aortic dilatation noted. There is borderline dilatation of the aortic root, measuring 38 mm.  7. The inferior vena cava is normal in size with greater than 50% respiratory variability, suggesting right atrial pressure of 3 mmHg. FINDINGS  Left Ventricle: Left ventricular ejection fraction, by estimation, is 60 to 65%. The left ventricle has normal function. The left ventricle has no regional wall motion abnormalities. The left ventricular internal cavity size was normal in size. There is  mild left ventricular hypertrophy. Left ventricular diastolic parameters are consistent with Grade I diastolic dysfunction (impaired relaxation). Right Ventricle: The right ventricular size is normal. Right ventricular systolic function is normal. Left Atrium: Left atrial size was mildly dilated. Right Atrium: Right atrial size was normal in size. Pericardium: There is no evidence of pericardial effusion. Presence of epicardial fat layer. Mitral Valve: The mitral valve is normal in structure. Moderate mitral annular calcification. No evidence of mitral valve regurgitation. No evidence of mitral valve stenosis. Tricuspid Valve: The tricuspid valve is normal in structure. Tricuspid valve regurgitation is trivial. No evidence of tricuspid stenosis. Aortic Valve: The aortic valve is tricuspid. Aortic valve  regurgitation is mild. Aortic valve sclerosis is present, with no evidence of aortic valve stenosis. Pulmonic Valve: The pulmonic valve was normal in structure. Pulmonic valve regurgitation is not visualized. No evidence of pulmonic stenosis. Aorta: Aortic dilatation noted. There is borderline dilatation of the aortic root, measuring 38 mm. Venous: The inferior vena cava is normal in size with greater than 50% respiratory variability, suggesting right atrial pressure of 3 mmHg. IAS/Shunts: No atrial level shunt detected by color flow Doppler.  LEFT VENTRICLE PLAX 2D LVIDd:         4.80 cm   Diastology LVIDs:         3.80 cm   LV e'  medial:  6.09 cm/s LV PW:         1.30 cm   LV e' lateral: 5.44 cm/s LV IVS:        1.20 cm LVOT diam:     1.90 cm LV SV:         83 LV SV Index:   36 LVOT Area:     2.84 cm  RIGHT VENTRICLE             IVC RV S prime:     15.40 cm/s  IVC diam: 1.60 cm LEFT ATRIUM             Index LA diam:        4.40 cm 1.92 cm/m LA Vol (A2C):   63.2 ml 27.51 ml/m LA Vol (A4C):   85.5 ml 37.21 ml/m LA Biplane Vol: 77.4 ml 33.69 ml/m  AORTIC VALVE LVOT Vmax:   149.00 cm/s LVOT Vmean:  100.000 cm/s LVOT VTI:    0.292 m  AORTA Ao Root diam: 3.80 cm Ao Asc diam:  3.70 cm  SHUNTS Systemic VTI:  0.29 m Systemic Diam: 1.90 cm Kirk Ruths MD Electronically signed by Kirk Ruths MD Signature Date/Time: 03/03/2021/4:34:20 PM    Final    VAS [place] LOWER EXTREMITY VENOUS (DVT)  Result Date: 03/04/2021  Lower Venous DVT Study Patient Name:  [facility]  Date of Exam:   03/04/2021 Medical Rec #: [id number]    Accession #:    [id number] Date of Birth: 1940/11/18    Patient Gender: F Patient Age:   68 years Exam Location:  [facility] Procedure:      VAS [place] LOWER EXTREMITY VENOUS (DVT) Referring Phys: Jerald Kief Maisy [place] --------------------------------------------------------------------------------  Indications: Elevated Ddimer.  Risk Factors: None identified. Limitations: Poor ultrasound/tissue  interface. Comparison Study: No prior studies. Performing Technologist: Oliver Hum RVT  Examination Guidelines: A complete evaluation includes B-mode imaging, spectral Doppler, color Doppler, and power Doppler as needed of all accessible portions of each vessel. Bilateral testing is considered an integral part of a complete examination. Limited examinations for reoccurring indications may be performed as noted. The reflux portion of the exam is performed with the patient in reverse Trendelenburg.  +---------+---------------+---------+-----------+----------+--------------+ RIGHT    CompressibilityPhasicitySpontaneityPropertiesThrombus Aging +---------+---------------+---------+-----------+----------+--------------+ CFV      Full           Yes      Yes                                 +---------+---------------+---------+-----------+----------+--------------+ SFJ      Full                                                        +---------+---------------+---------+-----------+----------+--------------+ FV Prox  Full                                                        +---------+---------------+---------+-----------+----------+--------------+ FV Mid   Full                                                        +---------+---------------+---------+-----------+----------+--------------+  FV DistalFull                                                        +---------+---------------+---------+-----------+----------+--------------+ PFV      Full                                                        +---------+---------------+---------+-----------+----------+--------------+ POP      Full           Yes      Yes                                 +---------+---------------+---------+-----------+----------+--------------+ PTV      Full                                                         +---------+---------------+---------+-----------+----------+--------------+ PERO     Full                                                        +---------+---------------+---------+-----------+----------+--------------+   +---------+---------------+---------+-----------+----------+--------------+ LEFT     CompressibilityPhasicitySpontaneityPropertiesThrombus Aging +---------+---------------+---------+-----------+----------+--------------+ CFV      Full           Yes      Yes                                 +---------+---------------+---------+-----------+----------+--------------+ SFJ      Full                                                        +---------+---------------+---------+-----------+----------+--------------+ FV Prox  Full                                                        +---------+---------------+---------+-----------+----------+--------------+ FV Mid   Full                                                        +---------+---------------+---------+-----------+----------+--------------+ FV DistalFull                                                        +---------+---------------+---------+-----------+----------+--------------+  PFV      Full                                                        +---------+---------------+---------+-----------+----------+--------------+ POP      Full           Yes      Yes                                 +---------+---------------+---------+-----------+----------+--------------+ PTV      Full                                                        +---------+---------------+---------+-----------+----------+--------------+ PERO     Full                                                        +---------+---------------+---------+-----------+----------+--------------+     Summary: RIGHT: - There is no evidence of deep vein thrombosis in the lower extremity.  - No cystic structure found in  the popliteal fossa.  LEFT: - There is no evidence of deep vein thrombosis in the lower extremity.  - No cystic structure found in the popliteal fossa.  *See table(s) above for measurements and observations.    Preliminary    CT ANGIO HEAD NECK W WO CM W PERF (CODE STROKE)  Result Date: 03/02/2021 CLINICAL DATA:  Stroke, follow up EXAM: CT ANGIOGRAPHY HEAD AND NECK CT PERFUSION BRAIN TECHNIQUE: Multidetector CT imaging of the head and neck was performed using the standard protocol during bolus administration of intravenous contrast. Multiplanar CT image reconstructions and MIPs were obtained to evaluate the vascular anatomy. Carotid stenosis measurements (when applicable) are obtained utilizing NASCET criteria, using the distal internal carotid diameter as the denominator. Multiphase CT imaging of the brain was performed following IV bolus contrast injection. Subsequent parametric perfusion maps were calculated using RAPID software. CONTRAST:  141mL OMNIPAQUE IOHEXOL 350 MG/ML SOLN COMPARISON:  None. FINDINGS: CTA NECK FINDINGS Aorta: Calcific atherosclerosis.  Great vessel origins are patent. Right carotid system: Common carotid artery is patent. Calcific atherosclerosis at the carotid bifurcation with approximately 40% stenosis. 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 Mild dilation in this region, compatible with small pseudoaneurysm. Both the false and true lumens are patent. Retropharyngeal course. Left carotid system: Carotid bifurcation atherosclerosis without greater than 50% stenosis. Mild irregularity of the ICA at the skull base, probably atherosclerotic given no intimal flap to suggest dissection. The ICAs tortuous at the skull base. Retropharyngeal course. Vertebral arteries: Mildly right dominant. Patent. Mild atherosclerotic irregularity bilaterally. Skeleton: Multilevel  degenerative change of the cervical spine. Other neck: No acute abnormality. Upper chest: Emphysema. No consolidation visualized lung apices. Biapical pleuroparenchymal scarring. Review of the MIP images confirms the above findings CTA HEAD FINDINGS Anterior circulation: Bilateral intracranial ICAs are patent  with mild atherosclerotic narrowing. Bilateral M1 MCAs are patent. Occlusion of the proximal right M2 MCA. 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 Motion limited evaluation of the ACAs and distal MCAs. The ACAs appear to be grossly patent. Posterior circulation: Dominant right intradural vertebral artery. Bilateral intradural vertebral artery, basilar artery, and posterior cerebral arteries are patent without proximal high-grade stenosis. Prominent right posterior communicating artery with small right P1 PCA, anatomic variant. Motion limited evaluation of the PCAs. Venous sinuses: As permitted by contrast timing, patent. Anatomic variants: Detailed above. CT Brain Perfusion Findings: CBF (<30%) Volume: 68mL Perfusion (Tmax>6.0s) volume: 67mL. Possibly overestimated given probably artifactual T-max greater than 6 seconds in the left anterior and middle cranial fossa. Mismatch Volume: 1mL Infarction Location:Posterior right MCA territory. IMPRESSION: 1. Acute right M2 MCA occlusion. 2. 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 3. On CT perfusion, largely completed right distal MCA territory infarct with 55 mL core infarct and 11 mL penumbra. Penumbra may be overestimated given probably artifactual T-max greater than 6 seconds in the left anterior and anterior middle cranial fossa. 4. 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 5. Bilateral carotid bifurcation atherosclerosis with approximately 40% stenosis of the proximal right ICA.  6. Aortic Atherosclerosis (ICD10-I70.0) and Emphysema (ICD10-J43.9). LVO, perfusion findings, and aneurysm discussed with Dr. Rory Percy 2:25 PM via telephone. Dissection discussed at 2:35 p.m. via telephone. Electronically Signed   By: Margaretha Sheffield M.D.   On: 03/02/2021 14:57        Scheduled Meds:   stroke: mapping our early stages of recovery book   Does not apply Once   aspirin EC  81 mg Oral Daily   bacitracin   Topical BID   fluticasone furoate-vilanterol  1 puff Inhalation Daily   And   umeclidinium bromide  1 puff Inhalation Daily   hydroxychloroquine  200 mg Oral BID   ipratropium-albuterol  3 mL Nebulization TID   leflunomide  20 mg Oral q morning   lidocaine  1 patch Transdermal Q24H   loratadine  10 mg Oral Daily   LORazepam  0.5 mg Intravenous Once   metoprolol tartrate  25 mg Oral BID   predniSONE  7.5 mg Oral Q breakfast   rOPINIRole  1 mg Oral QHS   rosuvastatin  20 mg Oral Daily   Continuous Infusions:  sodium chloride 75 mL/hr at 03/04/21 0851   cefTRIAXone (ROCEPHIN)  IV 1 g (03/03/21 1827)     LOS: 2 days    Time spent: 35 minutes.     Elmarie Shiley, MD [facility]   If 7PM-7AM, please contact night-coverage [email or web address]  03/04/2021, 10:43 AM

## 2021-03-04 NOTE — Progress Notes (Signed)
SLP Cancellation Note  Patient Details Name: [name] MRN: [id number] DOB: March 12, 1941   Cancelled treatment:       Reason Eval/Treat Not Completed: Other (comment) (Patient tired out from day of testing and went into afib last night. Plan to attempt f/u next date. Per son and daughter in law, patient ate a little applesauce today but not much else.)  Sonia Baller, MA, CCC-SLP Speech Therapy

## 2021-03-04 NOTE — Progress Notes (Addendum)
HOSPITAL MEDICINE OVERNIGHT EVENT NOTE    Notified by nursing that telemetry is revealing that patient has been intermittently going into atrial fibrillation.  Heart rate ranging between 100-115 bpm.  Twelve-lead EKG has been performed confirming atrial fibrillation at 100 bpm.  Confirmation of this atrial fibrillation is particularly important considering the patient is now presenting with an acute stroke.  Per my review of older medical records, patient exhibited a transient event of atrial fibrillation while hospitalized at [facility] in February/March 2022 thought to be secondary to electrolyte derangement.  There was an additional episode of atrial fibrillation documented during her subsequent SNF stay.    Initiating standard atrial fibrillation work-up including obtaining electrolyte panel including magnesium, TSH and troponin.  Echocardiogram has already been performed yesterday as part of her overall stroke work-up.   Finally, patient has been complaining of chest discomfort which is thought to be secondary to her rib fracture.  We will additionally obtain a D-dimer and pursue CT angiogram of the chest if this is elevated.    Patient will ultimately need to be placed on AV nodal blocking agents to control the heart rate sitting the patient is in a period of permissive hypertension we will avoid initiating AV nodal blocking agents for now and leave it up to the discretion of the day team.  We will only initiate overnight if rate becomes profoundly uncontrolled.  Finally, anticoagulation will likely need to full dose anticoagulation will likely need to be considered however considering the acute stroke the timeliness of this will need to be discussed with neurology first.  Rachel Emerald  MD [facility]

## 2021-03-04 NOTE — Progress Notes (Addendum)
STROKE TEAM PROGRESS NOTE   INTERVAL HISTORY Her son and daughter in law are at the bedside.  She is sitting in the chair in NAD on [place].  Went into A. fib with rapid ventricular rate last night.  She has been started on rate control medications and plans to switch aspirin Plavix to Eliquis today.  Her neurological exam is unchanged.  Vital signs are stable.  Lower extremity venous Dopplers are negative for DVT Vitals:   03/03/21 2330 03/04/21 0355 03/04/21 0733 03/04/21 0758  BP: 130/68 (!) 154/60 (!) 146/68   Pulse: 91 (!) 103 (!) 103   Resp: (!) 25 20 20    Temp: 97.9 F (36.6 C) 97.7 F (36.5 C) 98 F (36.7 C)   TempSrc: Oral Oral Oral   SpO2: 92% 94% 90% 93%  Weight:      Height:       CBC:  Recent Labs  Lab 03/02/21 1158 03/04/21 0708  WBC 15.8* 18.1*  NEUTROABS 11.3*  --   HGB 12.4 11.2*  HCT 38.4 34.2*  MCV 101.6* 98.8  PLT 135* 814*   Basic Metabolic Panel:  Recent Labs  Lab 03/02/21 1158 03/04/21 0647  NA 141 137  K 4.0 4.2  CL 105 104  CO2 23 24  GLUCOSE 142* 121*  BUN 19 15  CREATININE 0.94 1.01*  CALCIUM 8.5* 8.4*  MG  --  1.8   Lipid Panel:  Recent Labs  Lab 03/03/21 0046  CHOL 154  TRIG 115  HDL 57  CHOLHDL 2.7  VLDL 23  LDLCALC 74   HgbA1c:  Recent Labs  Lab 03/03/21 0046  HGBA1C 5.4   Urine Drug Screen: No results for input(s): LABOPIA, COCAINSCRNUR, LABBENZ, AMPHETMU, THCU, LABBARB in the last 168 hours.  Alcohol Level No results for input(s): ETH in the last 168 hours.  IMAGING past 24 hours ECHOCARDIOGRAM COMPLETE  Result Date: 03/03/2021    ECHOCARDIOGRAM REPORT   Patient Name:   [name] Date of Exam: 03/03/2021 Medical Rec #:  [id number]   Height:       67.0 in Accession #:    [id number]  Weight:       270.1 lb Date of Birth:  1941-02-13   BSA:          2.298 m Patient Age:    80 years    BP:           136/84 mmHg Patient Gender: F           HR:           101 bpm. Exam Location:  Inpatient Procedure: 2D Echo Indications:     stroke  History:        Patient has no prior history of Echocardiogram examinations.                 Right carotid dissection, COPD; Risk Factors:Hypertension and                 Sleep Apnea.  Sonographer:    Johny Chess RDCS Referring Phys: [place]  1. Left ventricular ejection fraction, by estimation, is 60 to 65%. The left ventricle has normal function. The left ventricle has no regional wall motion abnormalities. There is mild left ventricular hypertrophy. Left ventricular diastolic parameters are consistent with Grade I diastolic dysfunction (impaired relaxation).  2. Right ventricular systolic function is normal. The right ventricular size is normal.  3. Left atrial size was mildly dilated.  4. The mitral valve is normal in structure. No evidence of mitral valve regurgitation. No evidence of mitral stenosis. Moderate mitral annular calcification.  5. The aortic valve is tricuspid. Aortic valve regurgitation is mild. Aortic valve sclerosis is present, with no evidence of aortic valve stenosis.  6. Aortic dilatation noted. There is borderline dilatation of the aortic root, measuring 38 mm.  7. The inferior vena cava is normal in size with greater than 50% respiratory variability, suggesting right atrial pressure of 3 mmHg. FINDINGS  Left Ventricle: Left ventricular ejection fraction, by estimation, is 60 to 65%. The left ventricle has normal function. The left ventricle has no regional wall motion abnormalities. The left ventricular internal cavity size was normal in size. There is  mild left ventricular hypertrophy. Left ventricular diastolic parameters are consistent with Grade I diastolic dysfunction (impaired relaxation). Right Ventricle: The right ventricular size is normal. Right ventricular systolic function is normal. Left Atrium: Left atrial size was mildly dilated. Right Atrium: Right atrial size was normal in size. Pericardium: There is no evidence of pericardial  effusion. Presence of epicardial fat layer. Mitral Valve: The mitral valve is normal in structure. Moderate mitral annular calcification. No evidence of mitral valve regurgitation. No evidence of mitral valve stenosis. Tricuspid Valve: The tricuspid valve is normal in structure. Tricuspid valve regurgitation is trivial. No evidence of tricuspid stenosis. Aortic Valve: The aortic valve is tricuspid. Aortic valve regurgitation is mild. Aortic valve sclerosis is present, with no evidence of aortic valve stenosis. Pulmonic Valve: The pulmonic valve was normal in structure. Pulmonic valve regurgitation is not visualized. No evidence of pulmonic stenosis. Aorta: Aortic dilatation noted. There is borderline dilatation of the aortic root, measuring 38 mm. Venous: The inferior vena cava is normal in size with greater than 50% respiratory variability, suggesting right atrial pressure of 3 mmHg. IAS/Shunts: No atrial level shunt detected by color flow Doppler.  LEFT VENTRICLE PLAX 2D LVIDd:         4.80 cm   Diastology LVIDs:         3.80 cm   LV e' medial:  6.09 cm/s LV PW:         1.30 cm   LV e' lateral: 5.44 cm/s LV IVS:        1.20 cm LVOT diam:     1.90 cm LV SV:         83 LV SV Index:   36 LVOT Area:     2.84 cm  RIGHT VENTRICLE             IVC RV S prime:     15.40 cm/s  IVC diam: 1.60 cm LEFT ATRIUM             Index LA diam:        4.40 cm 1.92 cm/m LA Vol (A2C):   63.2 ml 27.51 ml/m LA Vol (A4C):   85.5 ml 37.21 ml/m LA Biplane Vol: 77.4 ml 33.69 ml/m  AORTIC VALVE LVOT Vmax:   149.00 cm/s LVOT Vmean:  100.000 cm/s LVOT VTI:    0.292 m  AORTA Ao Root diam: 3.80 cm Ao Asc diam:  3.70 cm  SHUNTS Systemic VTI:  0.29 m Systemic Diam: 1.90 cm Kirk Ruths MD Electronically signed by Kirk Ruths MD Signature Date/Time: 03/03/2021/4:34:20 PM    Final    VAS [place] LOWER EXTREMITY VENOUS (DVT)  Result Date: 03/04/2021  Lower Venous DVT Study Patient Name:  [name]  Date of Exam:  03/04/2021 Medical Rec #:  [id number]    Accession #:    [id number] Date of Birth: 04-25-1940    Patient Gender: F Patient Age:   10 years Exam Location:  [facility] Procedure:      VAS [place] LOWER EXTREMITY VENOUS (DVT) Referring Phys: Jerald Kief REGALADO --------------------------------------------------------------------------------  Indications: Elevated Ddimer.  Risk Factors: None identified. Limitations: Poor ultrasound/tissue interface. Comparison Study: No prior studies. Performing Technologist: Oliver Hum RVT  Examination Guidelines: A complete evaluation includes B-mode imaging, spectral Doppler, color Doppler, and power Doppler as needed of all accessible portions of each vessel. Bilateral testing is considered an integral part of a complete examination. Limited examinations for reoccurring indications may be performed as noted. The reflux portion of the exam is performed with the patient in reverse Trendelenburg.  +---------+---------------+---------+-----------+----------+--------------+ RIGHT    CompressibilityPhasicitySpontaneityPropertiesThrombus Aging +---------+---------------+---------+-----------+----------+--------------+ CFV      Full           Yes      Yes                                 +---------+---------------+---------+-----------+----------+--------------+ SFJ      Full                                                        +---------+---------------+---------+-----------+----------+--------------+ FV Prox  Full                                                        +---------+---------------+---------+-----------+----------+--------------+ FV Mid   Full                                                        +---------+---------------+---------+-----------+----------+--------------+ FV DistalFull                                                        +---------+---------------+---------+-----------+----------+--------------+ PFV      Full                                                         +---------+---------------+---------+-----------+----------+--------------+ POP      Full           Yes      Yes                                 +---------+---------------+---------+-----------+----------+--------------+ PTV      Full                                                        +---------+---------------+---------+-----------+----------+--------------+  PERO     Full                                                        +---------+---------------+---------+-----------+----------+--------------+   +---------+---------------+---------+-----------+----------+--------------+ LEFT     CompressibilityPhasicitySpontaneityPropertiesThrombus Aging +---------+---------------+---------+-----------+----------+--------------+ CFV      Full           Yes      Yes                                 +---------+---------------+---------+-----------+----------+--------------+ SFJ      Full                                                        +---------+---------------+---------+-----------+----------+--------------+ FV Prox  Full                                                        +---------+---------------+---------+-----------+----------+--------------+ FV Mid   Full                                                        +---------+---------------+---------+-----------+----------+--------------+ FV DistalFull                                                        +---------+---------------+---------+-----------+----------+--------------+ PFV      Full                                                        +---------+---------------+---------+-----------+----------+--------------+ POP      Full           Yes      Yes                                 +---------+---------------+---------+-----------+----------+--------------+ PTV      Full                                                         +---------+---------------+---------+-----------+----------+--------------+ PERO     Full                                                        +---------+---------------+---------+-----------+----------+--------------+  Summary: RIGHT: - There is no evidence of deep vein thrombosis in the lower extremity.  - No cystic structure found in the popliteal fossa.  LEFT: - There is no evidence of deep vein thrombosis in the lower extremity.  - No cystic structure found in the popliteal fossa.  *See table(s) above for measurements and observations. Electronically signed by Jamelle Haring on 03/04/2021 at 11:59:34 AM.    Final     PHYSICAL EXAM  Temp:  [97.7 F (36.5 C)-98.4 F (36.9 C)] 98 F (36.7 C) (11/20 0733) Pulse Rate:  [91-104] 103 (11/20 0733) Resp:  [19-25] 20 (11/20 0733) BP: (126-154)/(60-75) 146/68 (11/20 0733) SpO2:  [90 %-96 %] 93 % (11/20 0758)  General -obese elderly Caucasian lady, in no apparent distress on [place] @ 3LPM. Wears 3 L at home.  Ophthalmologic - fundi not visualized due to noncooperation.  Cardiovascular - Regular rhythm and rate.  Mental Status -  Level of arousal and orientation x3. Unable to name correct Hospital Language including expression, naming, repetition, comprehension was assessed and found intact.  Mild left neglect with right gaze and head turning Attention span and concentration were normal. Recent and remote memory were intact. Fund of Knowledge was assessed and was intact.  Cranial Nerves II - XII - II - left field cut. III, IV, VI - Right gaze preference. Left eye with saccadic movements on left gaze V - Facial sensation intact bilaterally. VII - Facial movement intact bilaterally. VIII - Hearing & vestibular intact bilaterally. X - Palate elevates symmetrically. XI - Chin turning & shoulder shrug intact bilaterally. XII - Tongue protrusion intact.  Motor Strength - RUE and RLE 5/5, LUE and LLE 4/5 Bulk was normal and fasciculations  were absent.  Diminished fine finger movements on the left.  Left grip weakness.  Onset of left upper extremity. Motor Tone - Muscle tone was assessed at the neck and appendages and was normal.  Sensory - decreased sensation on left side    Coordination - The patient had normal movements in the hands and feet with no ataxia or dysmetria.  Tremor was absent.  Gait and Station - deferred.   ASSESSMENT/PLAN [name] is a 80 y.o. female with history of 80 year old past medical history of COPD, GERD, hypercholesterolemia, migraine, presented to the emergency room and son found her down on the floor.  Unclear last known well but to the best of her ability last known well somewhere around 10 PM yesterday when she went to bed.  This morning she was found on the floor by son.  She does not remember when she got up or what she did after getting up.  She could not move the left side of her body but it was thought that she fell on that side and could not move  Stroke:  Acute right MCA infarct embolic secondary to possible Right ICA petrous segment dissection with right M2 occlusion etiology possibly trauma from the fall.  New diagnosis of atrial fibrillation during current hospitalization which could have also contributed CT head  1. Findings suggestive of acute right MCA territory infarction. Further evaluation with MRI of the brain is recommended. 2. Incidentally noted 7 mm partially peripherally calcified structure in the region of the proximal right MCA, suspicious for a small partially calcified aneurysm sac. Attention on follow-up vascular imaging. 3. No acute fracture or traumatic listhesis of the cervical spine. 4. Degenerative disc disease and facet arthropathy of the cervical spine, most pronounced at C5-6  and C6-7.  CTA head & neck  1. Acute right M2 MCA occlusion. 2. Dissection of the right internal carotid artery at the skull base with visible intimal flap, small pseudoaneurysm, and  patent false and true lumens. 3. On CT perfusion, largely completed right distal MCA territory infarct with 55 mL core infarct and 11 mL penumbra. Penumbra may be overestimated given probably artifactual T-max greater than 6 seconds in the left anterior and anterior middle cranial fossa. 4. 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 5. Bilateral carotid bifurcation atherosclerosis with approximately 40% stenosis of the proximal right ICA. 6. Aortic Atherosclerosis (ICD10-I70.0) and Emphysema   CT perfusion  largely completed right distal MCA territory infarct with 55 mL core infarct and 11 mL penumbra  MRI   1. Large acute posterior right MCA territory infarct. No hemorrhage or mass effect. 2. Small focus of acute ischemia in the left centrum semiovale. 3. Superiorly directed supraclinoid right ICA aneurysm, as better demonstrated on earlier CTA head neck  2D Echo ordered EKG ordered May need a loop after D/c LDL 74 HgbA1c 5.4 VTE prophylaxis - SCD's    Diet   DIET - DYS 1 Room service appropriate? Yes; Fluid consistency: Thin   No antithrombotic prior to admission, now on Eliquis for new onset A. fib Therapy recommendations:  CIR Disposition:  pending  Hypertension Home meds:  norvasc, lisinopril Stable Permissive hypertension (OK if < 220/120) but gradually normalize in 5-7 days Long-term BP goal normotensive  Hyperlipidemia Home meds:  crestor 5 mg  resumed in hospital, now on 20mg   LDL 74, goal < 70 Continue statin at discharge   Other Stroke Risk Factors Advanced Age >/= 63  Former Cigarette smoker Obesity, Body mass index is 42.3 kg/m., BMI >/= 30 associated with increased stroke risk, recommend weight loss, diet and exercise as appropriate  Obstructive Sleep Apnea  Other Active Problems COPD on home Oxygen GERD UTI on Ceftriaxone RLS  Hospital day # 2     Patient presented with a fall and subsequently was found to have mild left-sided  weakness with neglect and CT scan shows right MCA branch infarct with CTA showing right M2 occlusion.  Patient had small cold but did not have significant penumbra to justify thrombectomy.  CT angiogram shows dissection of the petrous right carotid artery but with distal flow.  She has developed new onset paroxysmal A. fib last night hence we will switch from antiplatelet agents to Eliquis anticoagulation continue aggressive risk factor modification.  Mobilize out of bed.  Therapy and rehab consults.  Long discussion with patient and son about MRI and answered questions.  Discussed with Dr. Jerald Kief.  Greater than 50% time during this 25-minute visit was spent on counseling and coordination of care and discussion with care team and answering questions.  Antony Contras, MD Medical Director [facility] Pager: [phone] 03/04/2021 1:25 PM  To contact Stroke Continuity provider, please refer to [email or web address] After hours, contact General Neurology

## 2021-03-04 NOTE — Progress Notes (Signed)
Bilateral lower extremity venous duplex has been completed. Preliminary results can be found in CV Proc through chart review.   03/04/21 10:17 AM [name] RVT

## 2021-03-05 ENCOUNTER — Other Ambulatory Visit (HOSPITAL_COMMUNITY): Payer: Self-pay

## 2021-03-05 LAB — BRAIN NATRIURETIC PEPTIDE: B Natriuretic Peptide: 379.6 pg/mL — ABNORMAL HIGH (ref 0.0–100.0)

## 2021-03-05 LAB — CBC
HCT: 35.4 % — ABNORMAL LOW (ref 36.0–46.0)
Hemoglobin: 11.3 g/dL — ABNORMAL LOW (ref 12.0–15.0)
MCH: 31.7 pg (ref 26.0–34.0)
MCHC: 31.9 g/dL (ref 30.0–36.0)
MCV: 99.2 fL (ref 80.0–100.0)
Platelets: 120 10*3/uL — ABNORMAL LOW (ref 150–400)
RBC: 3.57 MIL/uL — ABNORMAL LOW (ref 3.87–5.11)
RDW: 13.3 % (ref 11.5–15.5)
WBC: 18.6 10*3/uL — ABNORMAL HIGH (ref 4.0–10.5)
nRBC: 0 % (ref 0.0–0.2)

## 2021-03-05 LAB — BASIC METABOLIC PANEL
Anion gap: 7 (ref 5–15)
BUN: 13 mg/dL (ref 8–23)
CO2: 27 mmol/L (ref 22–32)
Calcium: 8.1 mg/dL — ABNORMAL LOW (ref 8.9–10.3)
Chloride: 104 mmol/L (ref 98–111)
Creatinine, Ser: 0.81 mg/dL (ref 0.44–1.00)
GFR, Estimated: >60 mL/min (ref >60)
Glucose, Bld: 103 mg/dL — ABNORMAL HIGH (ref 70–99)
Potassium: 4 mmol/L (ref 3.5–5.1)
Sodium: 138 mmol/L (ref 135–145)

## 2021-03-05 LAB — URINE CULTURE: Culture: NO GROWTH

## 2021-03-05 LAB — GLUCOSE, CAPILLARY
Glucose-Capillary: 95 mg/dL (ref 70–99)
Glucose-Capillary: 138 mg/dL — ABNORMAL HIGH (ref 70–99)
Glucose-Capillary: 177 mg/dL — ABNORMAL HIGH (ref 70–99)
Glucose-Capillary: 192 mg/dL — ABNORMAL HIGH (ref 70–99)

## 2021-03-05 MED ORDER — DILTIAZEM HCL 60 MG PO TABS: 60.0000 mg | ORAL_TABLET | Freq: Four times a day (QID) | ORAL | Status: DC

## 2021-03-05 MED ORDER — SALINE SPRAY 0.65 % NA SOLN
1.0000 | NASAL | Status: DC | PRN
  Filled 2021-03-05: qty 44

## 2021-03-05 MED ORDER — DILTIAZEM HCL 60 MG PO TABS
30.0000 mg | ORAL_TABLET | Freq: Three times a day (TID) | ORAL | Status: DC
  Administered 2021-03-05 (×2): 30 mg via ORAL
  Filled 2021-03-05 (×2): qty 1

## 2021-03-05 MED ORDER — DILTIAZEM HCL 60 MG PO TABS
90.0000 mg | ORAL_TABLET | Freq: Four times a day (QID) | ORAL | Status: DC
  Administered 2021-03-06: 60 mg via ORAL
  Filled 2021-03-05: qty 2

## 2021-03-05 MED ORDER — DILTIAZEM HCL 60 MG PO TABS
30.0000 mg | ORAL_TABLET | Freq: Once | ORAL | Status: AC
  Administered 2021-03-05: 30 mg via ORAL
  Filled 2021-03-05: qty 1

## 2021-03-05 MED ORDER — IPRATROPIUM-ALBUTEROL 0.5-2.5 (3) MG/3ML IN SOLN: 3.0000 mL | RESPIRATORY_TRACT | Status: DC | PRN

## 2021-03-05 MED ORDER — METHYLPREDNISOLONE SODIUM SUCC 40 MG IJ SOLR
40.0000 mg | Freq: Once | INTRAMUSCULAR | Status: AC
  Administered 2021-03-05: 40 mg via INTRAVENOUS
  Filled 2021-03-05: qty 1

## 2021-03-05 MED ORDER — FUROSEMIDE 10 MG/ML IJ SOLN
20.0000 mg | Freq: Once | INTRAMUSCULAR | Status: AC
  Administered 2021-03-05: 20 mg via INTRAVENOUS
  Filled 2021-03-05: qty 2

## 2021-03-05 MED ORDER — DILTIAZEM HCL 60 MG PO TABS
30.0000 mg | ORAL_TABLET | Freq: Four times a day (QID) | ORAL | Status: DC
  Administered 2021-03-05: 30 mg via ORAL
  Filled 2021-03-05: qty 1

## 2021-03-05 NOTE — Progress Notes (Signed)
Pt had witnessed fall during PT session. Assisted pt back to bed with PT via hoyer lift. Pt did not hit head during fall. No injuries noted. Pt reports pain when taking deep breaths but reported to pain before fall as well.

## 2021-03-05 NOTE — TOC Initial Note (Addendum)
Transition of Care [facility]) - Initial/Assessment Note    Patient Details  Name: [name] MRN: [id number] Date of Birth: June 11, 1940  Transition of [facility]) CM/SW Contact:    Cyndi Bender, RN Phone Number: 03/05/2021, 1:01 PM  Clinical Narrative:           Spoke to patient's son at bedside regarding transition needs. CIR is following. Patient lives alone and son lives very close. Patient has a 3&1 and Rolator but would like a Rolling walker. Patient agreeable to use in house provider. Spoke to [place] with [employer]. Rolling walker will be delivered to the room. Patient has home 02 with [employer] 2L. Patient has used [facility] in the past.   Patient new Eliquis prescription. Eliquis 30day free trial card given to patient. Patient is also requesting benefit check for Eliquis. Copay for Eliquis $45. Notified Son of copay  [employer] called and stated there would be a $68.09 charge for the rolling walker. I left a message with the son, Shanon Brow to return my call if he would like me to order the rolling walker.   Expected Discharge Plan: Home/Self Care Barriers to Discharge: Continued Medical Work up   Patient Goals and CMS Choice Patient states their goals for this hospitalization and ongoing recovery are:: return home      Expected Discharge Plan and Services Expected Discharge Plan: Home/Self Care   Discharge Planning Services: CM Consult Post Acute Care Choice: Durable Medical Equipment Living arrangements for the past 2 months: Single Family Home                 DME Arranged: Walker rolling DME Agency: [employer] Date DME Agency Contacted: 03/05/21 Time DME Agency Contacted: 51 Representative spoke with at DME Agency: Bronwen Betters            Prior Living Arrangements/Services Living arrangements for the past 2 months: [place] with:: Self Patient language and need for interpreter reviewed:: Yes        Need for Family Participation in Patient Care: Yes  (Comment) Care giver support system in place?: Yes (comment) Current home services: DME (3&1) Criminal Activity/Legal Involvement Pertinent to Current Situation/Hospitalization: No - Comment as needed  Activities of Daily Living      Permission Sought/Granted                  Emotional Assessment Appearance:: Appears older than stated age Attitude/Demeanor/Rapport: Engaged   Orientation: : Oriented to Self, Oriented to Place, Oriented to  Time, Oriented to Situation Alcohol / Substance Use: Not Applicable Psych Involvement: No (comment)  Admission diagnosis:  Acute ischemic right MCA stroke ([place]) [I63.511] Cerebrovascular accident (CVA), unspecified mechanism ([place]) [I63.9] Patient Active Problem List   Diagnosis Date Noted   Acute ischemic right MCA stroke ([place]) 03/02/2021   Dissection of right carotid artery ([place]) 03/02/2021   Rib fractures 03/02/2021   OSA on CPAP 08/26/2020   Essential hypertension 06/20/2020   H/O TB skin testing 06/20/2020   Acute lower UTI 06/12/2020   COVID-19 06/11/2020   Chronic respiratory failure with hypoxia ([place]) 06/01/2019   COPD mixed type ([place]) 03/14/2015   Rheumatoid arthritis ([place]) 03/14/2015   Dyspnea 02/06/2015   ILD (interstitial lung disease) ([place]) 02/06/2015   Cough 05/17/2011   PCP:  Lajean Manes, MD Pharmacy:   [facility] [place] [facility] [place] [address] [place] [address] Phone: [phone] Fax: [place], [place]  [address] [place] [address] Phone: [phone] Fax: [phone]     Social Determinants of Health (SDOH) Interventions    Readmission Risk Interventions No flowsheet data found.

## 2021-03-05 NOTE — Progress Notes (Signed)
Speech Language Pathology Treatment: Dysphagia;Cognitive-Linquistic  Patient Details Name: [name] MRN: [id number] DOB: 11/02/1940 Today's Date: 03/05/2021 Time: 8921-1941 SLP Time Calculation (min) (ACUTE ONLY): 26 min  Assessment / Plan / Recommendation Clinical Impression  F/u for swallowing and cognition.  Pt doing much better with regard to swallowing.  We placed her upper dentures and lower plate, and she was able to masticate and swallow crackers without any difficulty; consumption of mixed consistencies  (solid/thin water) was [facility].  Primary issues were related to feeding with poor awareness/attention on left, decreased proprioception and control LUE.  When items were brought to midline and she was able to visualize them, she could id them by name. Pt was oriented to place/situation; she demonstrated functional immediate recall, but poor awareness of situation and impaired recall of events of today/last night.  Recommend advancing diet to regular solids/thin liquids. Assist pt with self-feeding. Meds whole in water. SLP will f/u briefly to ensure safety on new diet; primary f/u for cognition.  Son and dtr in law present and agree wit hplan.   HPI HPI: 80 y.o. female presents to [facility] hospital on 03/02/2021 after being found down at home by son. Pt reports being unable to reach walker and experiencing a fall. CT head demonstrates R MCA stroke with R carotid artery dissection. PMH includes COPD/asthma, hypertension, rheumatoid arthritis, GERD, obstructive sleep apnea. She has been NPO awaiting BSE.      SLP Plan  Continue with current plan of care      Recommendations for follow up therapy are one component of a multi-disciplinary discharge planning process, led by the attending physician.  Recommendations may be updated based on patient status, additional functional criteria and insurance authorization.    Recommendations  Diet recommendations: Regular;Thin liquid Liquids provided  via: Cup;Straw Medication Administration: Whole meds with liquid Supervision: Staff to assist with self feeding Compensations: Minimize environmental distractions Postural Changes and/or Swallow Maneuvers: Seated upright 90 degrees                General recommendations: Rehab consult Oral Care Recommendations: Oral care BID;Staff/trained caregiver to provide oral care Follow Up Recommendations: Acute inpatient rehab (3hours/day) Assistance recommended at discharge: Frequent or constant Supervision/Assistance SLP Visit Diagnosis: Dysphagia, unspecified (R13.10) Plan: Continue with current plan of care       GO               [place] Spirito L. Tivis Ringer, [place] Office number [phone] Pager [phone]  Assunta Curtis  03/05/2021, 6:29 PM

## 2021-03-05 NOTE — Progress Notes (Signed)
SLP Cancellation Note  Patient Details Name: [name] MRN: [id number] DOB: [birth year]   Cancelled treatment:       Reason Eval/Treat Not Completed: Patient at procedure or test/unavailable (Treatment attempted twice; Pt initially working with PT and subsequently with PT and RN. SLP will follow up later if schedule allows.)  [place]or Gentle I. Hardin Negus, [place], [place] Office number [phone] Pager [place] 03/05/2021, 1:47 PM

## 2021-03-05 NOTE — Care Management Important Message (Signed)
Important Message  Patient Details  Name: [name] MRN: [id number] Date of Birth: [birth year]   Medicare Important Message Given:  Yes     Orbie Pyo 03/05/2021, 2:39 PM

## 2021-03-05 NOTE — Discharge Instructions (Signed)

## 2021-03-05 NOTE — Progress Notes (Signed)
Inpatient Rehab Admissions Coordinator:   CIR consult received. I spoke with Pt.'s son, Shanon Brow, regarding potential CIR admit. He is interested but unsure if family can provide 24/7 care following discharge from [facility]. I will follow up with him tomorrow morning and see what family is able to work out.   Clemens Catholic, [place], [place] Admissions Coordinator  [phone] ([place]) [phone] (office)

## 2021-03-05 NOTE — Progress Notes (Addendum)
HOSPITAL MEDICINE OVERNIGHT EVENT NOTE    Notified by nursing that patient is currently in rapid atrial fibrillation, heart rates as high as 130s to 140s.  Other than patient's rib fracture related chest discomfort, patient is experiencing no acute symptoms.  Patient is awake and alert and is actually hypertensive, current blood pressure of 155/96.  Will increase frequency of oral Cardizem from 30 mg every 8 hours to 30 mg every 6 hours and reassess rate control in 30 minutes.  Vernelle Emerald  MD [facility]    ADDENDUM 11/21 9:30PM  Rate still uncontrolled with HR in the 120's to 130's.  Adding an additional 30mg  of Cardizem to make it 60mg  PO Q6hrs.  Monitoring closely.  Sherryll Burger Taishawn Smaldone  ADDENDUM 11/21 12:05AM  Rate now controlled, HR 90-100 on 60mg  Cardizem PO Q6hrs.  Sherryll Burger Stevan Eberwein

## 2021-03-05 NOTE — Progress Notes (Signed)
   03/05/21 0941  Assess: MEWS Score  Temp 98.9 F (37.2 C)  BP (!) 156/68  Pulse Rate (!) 107  ECG Heart Rate (!) 111  Resp (!) 22  SpO2 96 %  O2 Device Nasal Cannula  O2 Flow Rate (L/min) 3 L/min  Assess: MEWS Score  MEWS Temp 0  MEWS Systolic 0  MEWS Pulse 2  MEWS RR 1  MEWS LOC 0  MEWS Score 3  MEWS Score Color Yellow  Assess: if the MEWS score is Yellow or Red  Were vital signs taken at a resting state? Yes  Focused Assessment Change from prior assessment (see assessment flowsheet)  Early Detection of Sepsis Score *See Row Information* Medium  MEWS guidelines implemented *See Row Information* Yes  Treat  MEWS Interventions Administered scheduled meds/treatments  Take Vital Signs  Increase Vital Sign Frequency  Yellow: Q 2hr X 2 then Q 4hr X 2, if remains yellow, continue Q 4hrs  Escalate  MEWS: Escalate Yellow: discuss with charge nurse/RN and consider discussing with provider and RRT  Notify: Charge Nurse/RN  Name of Charge Nurse/RN Notified Desiray, RN  Date Charge Nurse/RN Notified 03/05/21  Time Charge Nurse/RN Notified 0941  Notify: Provider  Provider Name/Title Dr. Frederic Jericho  Date Provider Notified 03/05/21  Time Provider Notified [phone]  Notification Type Page  Notification Reason Other (Comment) (fast breathing)  Provider response Other (Comment) (Dr Tyrell Antonio rounded on patient and addressed issues with heart rate)  Date of Provider Response 03/05/21  Time of Provider Response 0941  Notify: Rapid Response  Name of Rapid Response RN Notified Not necessary @ this time

## 2021-03-05 NOTE — Consult Note (Signed)
[place] Nurse Consult Note: Patient receiving care in [facility] [phone] Reason for Consult: LLE wound Wound type: Left lower extremity partial thickness skin tear  Pressure Injury POA: NA Measurement: 16 x 0.3 Wound bed: Pink and yellow with partial skin flap, moist Drainage (amount, consistency, odor) brown tan drainage on dressing.  Periwound: erythematous, painful Dressing procedure/placement/frequency: Cleanse the LLE skin tear with NS then place a cut to fit piece of Xeroform gauze over the wound. Wrap with Kerlix. Change daily.   Monitor the wound area(s) for worsening of condition such as: Signs/symptoms of infection, increase in size, development of or worsening of odor, development of pain, or increased pain at the affected locations.   Notify the medical team if any of these develop.  Thank you for the consult. [place] nurse will not follow at this time.   Please re-consult the [place] team if needed.  Cathlean Marseilles Tamala Julian, MSN, RN, [place], Lysle Pearl, [facility] [place] Wound Treatment Associate Pager [phone]

## 2021-03-05 NOTE — Progress Notes (Signed)
   03/05/21 1254  What Happened  Was fall witnessed? Yes  Who witnessed fall? Reginia Naas PT  Patients activity before fall during therapy;to/from bed, chair, or stretcher  Point of contact buttocks  Was patient injured? No  Follow Up  MD notified Dr. Tyrell Antonio  Time MD notified 31  Family notified Yes - comment (son was in room with pt during fall)  Time family notified 1230  Additional tests No  Progress note created (see row info) Yes  Adult Fall Risk Assessment  Risk Factor Category (scoring not indicated) Fall has occurred during this admission (document High fall risk)  Patient Fall Risk Level High fall risk  Adult Fall Risk Interventions  Required Bundle Interventions *See Row Information* High fall risk - low, moderate, and high requirements implemented  Additional Interventions Use of appropriate toileting equipment (bedpan, BSC, etc.);PT/OT need assessed if change in mobility from baseline  Screening for Fall Injury Risk (To be completed on HIGH fall risk patients) - Assessing Need for Floor Mats  Risk For Fall Injury- Criteria for Floor Mats Admitted as a result of a fall  Will Implement Floor Mats Yes  Vitals  Pulse Rate 89  ECG Heart Rate 86  Resp 16  Oxygen Therapy  SpO2 94 %

## 2021-03-05 NOTE — Progress Notes (Signed)
Physical Therapy Treatment Patient Details Name: [name] MRN: [id number] DOB: 1940-05-06 Today's Date: 03/05/2021   History of Present Illness 80 y.o. female presents to [facility] hospital on 03/02/2021 after being found down at home by son. Pt reports being unable to reach walker and experiencing a fall. CT head demonstrates R MCA stroke with R carotid artery dissection. PMH includes COPD/asthma, hypertension, rheumatoid arthritis, GERD, obstructive sleep apnea.    PT Comments    Patient with limited progress this session as had LOB and unable to recover so PT lowered to floor.  Patient limited by decreased body-spatial awareness.  She also has fear of falling due to recent fall and could not coordinate to self correct after sitting on armrest of chair.  She was having difficulty coughing up sputum so sat up in chair position in bed after return to bed and noted improved.  Patient will continue to benefit from skilled PT in the acute setting and feel she can make good progress with acute inpatient rehab at d/c.    Recommendations for follow up therapy are one component of a multi-disciplinary discharge planning process, led by the attending physician.  Recommendations may be updated based on patient status, additional functional criteria and insurance authorization.  Follow Up Recommendations  Acute inpatient rehab (3hours/day)     Assistance Recommended at Discharge Frequent or constant Supervision/Assistance  Equipment Recommendations  Other (comment) (TBA)    Recommendations for Other Services       Precautions / Restrictions Precautions Precautions: Fall Precaution Comments: L inattention/neglect     Mobility  Bed Mobility Overal bed mobility: Needs Assistance Bed Mobility: Supine to Sit     Supine to sit: Mod assist;HOB elevated     General bed mobility comments: cues for sequencing moving legs off bed, assist with rail to lift trunk for limiting rib pain     Transfers Overall transfer level: Needs assistance Equipment used: Rolling walker (2 wheels) Transfers: Sit to/from Stand Sit to Stand: Min assist;From elevated surface     Step pivot transfers: Mod assist     General transfer comment: assist for balance, cues for hand placement, increased time; able to initiate stepping to recliner; but LOB while PT reaching to pick up linen that fell to the floor and pt began to sit on armrest of recliner and could not recover despite cues and assist, assisted to lower pt to floor and RN x 2 in the room to help lift with maximove to bed    Ambulation/Gait                   Stairs             Wheelchair Mobility    Modified Rankin (Stroke Patients Only) Modified Rankin (Stroke Patients Only) Pre-Morbid Rankin Score: Slight disability Modified Rankin: Severe disability     Balance Overall balance assessment: Needs assistance   Sitting balance-Leahy Scale: Good     Standing balance support: Reliant on assistive device for balance;Bilateral upper extremity supported Standing balance-Leahy Scale: Poor Standing balance comment: poor body spatial awareness; can balance at times with RW and min A; but unable to correct from a LOB                            Cognition Arousal/Alertness: Awake/alert Behavior During Therapy: WFL for tasks assessed/performed Overall Cognitive Status: Impaired/Different from baseline Area of Impairment: Attention;Memory;Following commands;Safety/judgement;Problem solving  Current Attention Level: Sustained Memory: Decreased short-term memory Following Commands: Follows one step commands inconsistently;Follows one step commands with increased time Safety/Judgement: Decreased awareness of deficits Awareness: Intellectual Problem Solving: Slow processing;Difficulty sequencing;Requires verbal cues;Requires tactile cues General Comments: attends to minute details,  but misses big picture; decreased body-spatial awareness        Exercises      General Comments General comments (skin integrity, edema, etc.): on 2L O2 initially, while on floor and trying to get up with lift noted SpO2 in 80's and RN increased O2.  Patient HR in a-fib running 80's-110's; son and daughter in law present, but stepped out during mobility      Pertinent Vitals/Pain Faces Pain Scale: Hurts little more Pain Descriptors / Indicators: Tender;Sore Pain Intervention(s): Monitored during session;Repositioned;RN gave pain meds during session    Home Living                          Prior Function            PT Goals (current goals can now be found in the care plan section) Progress towards PT goals: Not progressing toward goals - comment (limited by LOB)    Frequency    Min 4X/week      PT Plan      Co-evaluation              AM-PAC PT "6 Clicks" Mobility   Outcome Measure  Help needed turning from your back to your side while in a flat bed without using bedrails?: A Lot Help needed moving from lying on your back to sitting on the side of a flat bed without using bedrails?: A Lot Help needed moving to and from a bed to a chair (including a wheelchair)?: Total Help needed standing up from a chair using your arms (e.g., wheelchair or bedside chair)?: A Little Help needed to walk in hospital room?: Total Help needed climbing 3-5 steps with a railing? : Total 6 Click Score: 10    End of Session Equipment Utilized During Treatment: Gait belt;Oxygen Activity Tolerance: Patient limited by fatigue Patient left: in bed;with call bell/phone within reach;with family/visitor present;with bed alarm set   PT Visit Diagnosis: Other abnormalities of gait and mobility (R26.89);Muscle weakness (generalized) (M62.81);Other symptoms and signs involving the nervous system (R29.898)     Time: 0762-2633 PT Time Calculation (min) (ACUTE ONLY): 45  min  Charges:  $Therapeutic Activity: 38-52 mins                     Magda Kiel, PT Acute Rehabilitation Services Pager:[phone] [phone] 03/05/2021    Reginia Naas 03/05/2021, 2:11 PM

## 2021-03-05 NOTE — Progress Notes (Signed)
PROGRESS NOTE    ALEXSYS ESKIN  XFG:[id number] DOB: 07-30-1940 DOA: 03/02/2021 PCP: Lajean Manes, MD   Brief Narrative: 80 year old with past medical history significant for COPD/asthma, hypertension, rheumatoid arthritis, GERD, obstructive sleep apnea who was found down on the floor by her son earlier the day of admission 03/02/2021.  Patient was last known well around 10 PM tonight prior to admission when she went to bed.  Patient does not remember when she got up, but stated she got up in order to use the bathroom but was unable to get to her walker and fell.  She reported having difficulty moving the left side of her body.  Son tried to get in touch with her and she did not answer, he broke into her house and found her on the ground.  Evaluation in the ED CT head showed right MCA stroke with right carotid artery dissection.   Assessment & Plan:   Principal Problem:   Acute ischemic right MCA stroke (HCC) Active Problems:   COPD mixed type (HCC)   Rheumatoid arthritis ([place])   Acute lower UTI   Essential hypertension   OSA on CPAP   Dissection of right carotid artery (HCC)   Rib fractures   1-Acute Right MCA Stroke:  Likely embolic secondary to possible right ICA dissection with right M2 occlusion and small vessel disease source. MRI: Large acute posterior right MCA territory infarct.  Small focus of acute ischemia in the left centrum semiovale old.  Superiorly directed supraclinoid right ICA aneurysm. CTA: Acute right M2 MCA occlusion.  Dissection of the right internal carotid artery at the skull base with visible intimal flap, a small pseudoaneurysm and patent false and true lumens.  Superiorly 80 x 10 mm right supraclinoid ICA aneurysmal.  Bilateral carotid bifurcation atherosclerosis with 40% stenosis of the proximal right ICA. LDL: 74 A1c: 0.4 2D echo: EF 65 %. Grade 1 Diastolic Dysfunction.  PT recommended CIR Develop A fib RVR last night. Ok to start Eliquis per neurology.  Stop aspirin and plavix.   2-Right Carotid Artery dissection:  Neurology discussed with neuro interventionist.  No thrombectomy due to very high risk for hemorrhagic transformation the dissection does not appear to be flow-limiting or any sort of thrombus large. Due to absence of any thrombus no need for IV heparin.  Further management per neurology  COPD: Possible exacerbation Patient report worsening shortness of breath.  She is not able to take deep breaths. Of shortness of breath related to rib fracture.  With we will proceed with IVF steroids, scheduled nebulizer -CT chest 11/18: Patient with prominent fibrotic changes in both lung, overall not significantly progressed since 2021.  Patchy opacity in the lateral left base and lingula reflect additional areas of scar or atelectasis. -On Breo-incruse.  -On chronic prednisone.  Some SOB today, paln to give a dose of IV solumedrol and IV lasix.   Positive D dimer:  -Doppler LE negative for DVT.  -CTA  Negative for PE>   A fib RVR;  Plan to change metoprolol to cardizem, to avoid BB effect to COPD>  Ok to start Eliquis per neurology./   Anterior Rib Fx:  Patient complaining of rib pain and shortness of breath.  Continue with lidocaine patch, change Vicodin to Q 4 HR PRN Incentive spirometry.  UTI; Presented with UA with more than 50 white blood cell positive nitrate Follow urine culture Continue with IV ceftriaxone   HTN: Permissive hypertension normalizing 5 to 7 days Holding Norvasc and lisinopril  Started Cardizem  for A fib.    RA; On Plaquenil, [place].   OSA on CPAP:  CPAP ordered.         Estimated body mass index is 42.3 kg/m as calculated from the following:   Height as of this encounter: 5\' 7"  (1.702 m).   Weight as of this encounter: 122.5 kg.   DVT prophylaxis: SCD Code Status: DNR, discussed with son who was at bedside.  Family Communication: Son at bedside updated.  Disposition Plan:  Status is:  Inpatient  Remains inpatient appropriate because: admitted with stroke.     Consultants:  Neurology   Procedures:  ECHO pending  Antimicrobials:  Ceftriaxone  Subjective: She feels she is breathing shallow  Report chest pain with deep breath.    Objective: Vitals:   03/05/21 0749 03/05/21 0750 03/05/21 0753 03/05/21 0800  BP:    (!) 192/69  Pulse:    90  Resp:    19  Temp:    98.9 F (37.2 C)  TempSrc:    Oral  SpO2: 96% 97% 96% 99%  Weight:      Height:        Intake/Output Summary (Last 24 hours) at 03/05/2021 0855 Last data filed at 03/05/2021 0300 Gross per 24 hour  Intake 939.6 ml  Output 1100 ml  Net -160.4 ml    Filed Weights   03/02/21 2301  Weight: 122.5 kg    Examination:  General exam: NAD Respiratory system:BL wheezing Cardiovascular system: S 1, S 2 IRR Gastrointestinal system: BS present, soft nt Central nervous system: Alert, follows command left side neglect  Extremities: no edema    Data Reviewed: I have personally reviewed following labs and imaging studies  CBC: Recent Labs  Lab 03/02/21 1158 03/04/21 0708 03/05/21 0125  WBC 15.8* 18.1* 18.6*  NEUTROABS 11.3*  --   --   HGB 12.4 11.2* 11.3*  HCT 38.4 34.2* 35.4*  MCV 101.6* 98.8 99.2  PLT 135* 126* 120*    Basic Metabolic Panel: Recent Labs  Lab 03/02/21 1158 03/04/21 0647 03/05/21 0125  NA 141 137 138  K 4.0 4.2 4.0  CL 105 104 104  CO2 23 24 27   GLUCOSE 142* 121* 103*  BUN 19 15 13   CREATININE 0.94 1.01* 0.81  CALCIUM 8.5* 8.4* 8.1*  MG  --  1.8  --     GFR: Estimated Creatinine Clearance: 74 mL/min (by C-G formula based on SCr of 0.81 mg/dL). Liver Function Tests: Recent Labs  Lab 03/02/21 1158  AST 30  ALT 17  ALKPHOS 63  BILITOT 0.9  PROT 6.7  ALBUMIN 3.6    No results for input(s): LIPASE, AMYLASE in the last 168 hours. No results for input(s): AMMONIA in the last 168 hours. Coagulation Profile: Recent Labs  Lab 03/02/21 1158  INR  0.9    Cardiac Enzymes: No results for input(s): CKTOTAL, CKMB, CKMBINDEX, TROPONINI in the last 168 hours. BNP (last 3 results) No results for input(s): PROBNP in the last 8760 hours. HbA1C: Recent Labs    03/03/21 0046  HGBA1C 5.4    CBG: Recent Labs  Lab 03/02/21 1206 03/03/21 2003 03/04/21 2054 03/05/21 0803  GLUCAP 141* 138* 93 95    Lipid Profile: Recent Labs    03/03/21 0046  CHOL 154  HDL 57  LDLCALC 74  TRIG 115  CHOLHDL 2.7    Thyroid Function Tests: Recent Labs    03/04/21 0647  TSH 1.735    Anemia Panel:  No results for input(s): VITAMINB12, FOLATE, FERRITIN, TIBC, IRON, RETICCTPCT in the last 72 hours. Sepsis Labs: No results for input(s): PROCALCITON, LATICACIDVEN in the last 168 hours.  Recent Results (from the past 240 hour(s))  Resp Panel by RT-PCR (Flu A&B, Covid) Nasopharyngeal Swab     Status: None   Collection Time: 03/02/21  3:30 PM   Specimen: Nasopharyngeal Swab; Nasopharyngeal(NP) swabs in vial transport medium  Result Value Ref Range Status   SARS Coronavirus 2 by RT PCR NEGATIVE NEGATIVE Final    Comment: (NOTE) SARS-CoV-2 target nucleic acids are NOT DETECTED.  The SARS-CoV-2 RNA is generally detectable in upper respiratory specimens during the acute phase of infection. The lowest concentration of SARS-CoV-2 viral copies this assay can detect is 138 copies/mL. A negative result does not preclude SARS-Cov-2 infection and should not be used as the sole basis for treatment or other patient management decisions. A negative result may occur with  improper specimen collection/handling, submission of specimen other than nasopharyngeal swab, presence of viral mutation(s) within the areas targeted by this assay, and inadequate number of viral copies(<138 copies/mL). A negative result must be combined with clinical observations, patient history, and epidemiological information. The expected result is Negative.  Fact Sheet for  Patients:  EntrepreneurPulse.com.au  Fact Sheet for Healthcare Providers:  IncredibleEmployment.be  This test is no t yet approved or cleared by the [place] FDA and  has been authorized for detection and/or diagnosis of SARS-CoV-2 by FDA under an Emergency Use Authorization (EUA). This EUA will remain  in effect (meaning this test can be used) for the duration of the COVID-19 declaration under Section 564(b)(1) of the Act, 21 U.S.C.section 360bbb-3(b)(1), unless the authorization is terminated  or revoked sooner.       Influenza A by PCR NEGATIVE NEGATIVE Final   Influenza B by PCR NEGATIVE NEGATIVE Final    Comment: (NOTE) The Xpert Xpress SARS-CoV-2/FLU/RSV plus assay is intended as an aid in the diagnosis of influenza from Nasopharyngeal swab specimens and should not be used as a sole basis for treatment. Nasal washings and aspirates are unacceptable for Xpert Xpress SARS-CoV-2/FLU/RSV testing.  Fact Sheet for Patients: EntrepreneurPulse.com.au  Fact Sheet for Healthcare Providers: IncredibleEmployment.be  This test is not yet approved or cleared by the [place] FDA and has been authorized for detection and/or diagnosis of SARS-CoV-2 by FDA under an Emergency Use Authorization (EUA). This EUA will remain in effect (meaning this test can be used) for the duration of the COVID-19 declaration under Section 564(b)(1) of the Act, 21 U.S.C. section 360bbb-3(b)(1), unless the authorization is terminated or revoked.  Performed at [facility], [address], [place], [place] [address]           Radiology Studies: CT Angio Chest Pulmonary Embolism (PE) W or WO Contrast  Result Date: 03/04/2021 CLINICAL DATA:  Chest pain, shortness of breath. Possible effusion. Fall. EXAM: CT ANGIOGRAPHY CHEST WITH CONTRAST TECHNIQUE: Multidetector CT imaging of the chest was performed using the standard  protocol during bolus administration of intravenous contrast. Multiplanar CT image reconstructions and MIPs were obtained to evaluate the vascular anatomy. CONTRAST:  25mL OMNIPAQUE IOHEXOL 350 MG/ML SOLN COMPARISON:  03/02/2021 FINDINGS: Cardiovascular: Mild cardiomegaly. Calcification over the mitral valve annulus. Calcified plaque over the left main and 3 vessel coronary arteries. Thoracic aorta is normal caliber. There is calcified plaque over the thoracic aorta. Pulmonary arterial system is well opacified and demonstrates no evidence of emboli. Remaining vascular structures are unremarkable. Mediastinum/Nodes: No mediastinal  or hilar adenopathy. Remaining mediastinal structures are normal. Lungs/Pleura: Lungs are adequately inflated without focal airspace consolidation or effusion. Minimal stable patchy fibrotic change. Airways are normal. Upper Abdomen: Calcified plaque over the abdominal aorta. High density material within the gallbladder likely contrast excretion. Musculoskeletal: Spondylosis of the thoracic spine. Review of the MIP images confirms the above findings. IMPRESSION: 1. No acute cardiopulmonary disease and no evidence of pulmonary embolism. 2. Patchy stable pulmonary fibrotic changes. 3. Mild cardiomegaly. Atherosclerotic coronary artery disease. 4. Aortic atherosclerosis. Aortic Atherosclerosis (ICD10-I70.0). Electronically Signed   By: Marin Olp M.D.   On: 03/04/2021 15:12   ECHOCARDIOGRAM COMPLETE  Result Date: 03/03/2021    ECHOCARDIOGRAM REPORT   Patient Name:   Lylie C Herrera Date of Exam: 03/03/2021 Medical Rec #:  [id number]   Height:       67.0 in Accession #:    [id number]  Weight:       270.1 lb Date of Birth:  04-07-1941   BSA:          2.298 m Patient Age:    89 years    BP:           136/84 mmHg Patient Gender: F           HR:           101 bpm. Exam Location:  Inpatient Procedure: 2D Echo Indications:    stroke  History:        Patient has no prior history of Echocardiogram  examinations.                 Right carotid dissection, COPD; Risk Factors:Hypertension and                 Sleep Apnea.  Sonographer:    Johny Chess RDCS Referring Phys: [place]  1. Left ventricular ejection fraction, by estimation, is 60 to 65%. The left ventricle has normal function. The left ventricle has no regional wall motion abnormalities. There is mild left ventricular hypertrophy. Left ventricular diastolic parameters are consistent with Grade I diastolic dysfunction (impaired relaxation).  2. Right ventricular systolic function is normal. The right ventricular size is normal.  3. Left atrial size was mildly dilated.  4. The mitral valve is normal in structure. No evidence of mitral valve regurgitation. No evidence of mitral stenosis. Moderate mitral annular calcification.  5. The aortic valve is tricuspid. Aortic valve regurgitation is mild. Aortic valve sclerosis is present, with no evidence of aortic valve stenosis.  6. Aortic dilatation noted. There is borderline dilatation of the aortic root, measuring 38 mm.  7. The inferior vena cava is normal in size with greater than 50% respiratory variability, suggesting right atrial pressure of 3 mmHg. FINDINGS  Left Ventricle: Left ventricular ejection fraction, by estimation, is 60 to 65%. The left ventricle has normal function. The left ventricle has no regional wall motion abnormalities. The left ventricular internal cavity size was normal in size. There is  mild left ventricular hypertrophy. Left ventricular diastolic parameters are consistent with Grade I diastolic dysfunction (impaired relaxation). Right Ventricle: The right ventricular size is normal. Right ventricular systolic function is normal. Left Atrium: Left atrial size was mildly dilated. Right Atrium: Right atrial size was normal in size. Pericardium: There is no evidence of pericardial effusion. Presence of epicardial fat layer. Mitral Valve: The mitral valve is  normal in structure. Moderate mitral annular calcification. No evidence of mitral valve regurgitation. No evidence of mitral valve stenosis.  Tricuspid Valve: The tricuspid valve is normal in structure. Tricuspid valve regurgitation is trivial. No evidence of tricuspid stenosis. Aortic Valve: The aortic valve is tricuspid. Aortic valve regurgitation is mild. Aortic valve sclerosis is present, with no evidence of aortic valve stenosis. Pulmonic Valve: The pulmonic valve was normal in structure. Pulmonic valve regurgitation is not visualized. No evidence of pulmonic stenosis. Aorta: Aortic dilatation noted. There is borderline dilatation of the aortic root, measuring 38 mm. Venous: The inferior vena cava is normal in size with greater than 50% respiratory variability, suggesting right atrial pressure of 3 mmHg. IAS/Shunts: No atrial level shunt detected by color flow Doppler.  LEFT VENTRICLE PLAX 2D LVIDd:         4.80 cm   Diastology LVIDs:         3.80 cm   LV e' medial:  6.09 cm/s LV PW:         1.30 cm   LV e' lateral: 5.44 cm/s LV IVS:        1.20 cm LVOT diam:     1.90 cm LV SV:         83 LV SV Index:   36 LVOT Area:     2.84 cm  RIGHT VENTRICLE             IVC RV S prime:     15.40 cm/s  IVC diam: 1.60 cm LEFT ATRIUM             Index LA diam:        4.40 cm 1.92 cm/m LA Vol (A2C):   63.2 ml 27.51 ml/m LA Vol (A4C):   85.5 ml 37.21 ml/m LA Biplane Vol: 77.4 ml 33.69 ml/m  AORTIC VALVE LVOT Vmax:   149.00 cm/s LVOT Vmean:  100.000 cm/s LVOT VTI:    0.292 m  AORTA Ao Root diam: 3.80 cm Ao Asc diam:  3.70 cm  SHUNTS Systemic VTI:  0.29 m Systemic Diam: 1.90 cm Kirk Ruths MD Electronically signed by Kirk Ruths MD Signature Date/Time: 03/03/2021/4:34:20 PM    Final    VAS [place] LOWER EXTREMITY VENOUS (DVT)  Result Date: 03/04/2021  Lower Venous DVT Study Patient Name:  [facility]  Date of Exam:   03/04/2021 Medical Rec #: [id number]    Accession #:    [id number] Date of Birth: 1941/01/09    Patient  Gender: F Patient Age:   23 years Exam Location[facility]  [facility] Procedure:      VAS [place] LOWER EXTREMITY VENOUS (DVT) Referring Phys: Jerald Kief Reynard Christoffersen --------------------------------------------------------------------------------  Indications: Elevated Ddimer.  Risk Factors: None identified. Limitations: Poor ultrasound/tissue interface. Comparison Study: No prior studies. Performing Technologist: Oliver Hum RVT  Examination Guidelines: A complete evaluation includes B-mode imaging, spectral Doppler, color Doppler, and power Doppler as needed of all accessible portions of each vessel. Bilateral testing is considered an integral part of a complete examination. Limited examinations for reoccurring indications may be performed as noted. The reflux portion of the exam is performed with the patient in reverse Trendelenburg.  +---------+---------------+---------+-----------+----------+--------------+ RIGHT    CompressibilityPhasicitySpontaneityPropertiesThrombus Aging +---------+---------------+---------+-----------+----------+--------------+ CFV      Full           Yes      Yes                                 +---------+---------------+---------+-----------+----------+--------------+ SFJ      Full                                                        +---------+---------------+---------+-----------+----------+--------------+  FV Prox  Full                                                        +---------+---------------+---------+-----------+----------+--------------+ FV Mid   Full                                                        +---------+---------------+---------+-----------+----------+--------------+ FV DistalFull                                                        +---------+---------------+---------+-----------+----------+--------------+ PFV      Full                                                         +---------+---------------+---------+-----------+----------+--------------+ POP      Full           Yes      Yes                                 +---------+---------------+---------+-----------+----------+--------------+ PTV      Full                                                        +---------+---------------+---------+-----------+----------+--------------+ PERO     Full                                                        +---------+---------------+---------+-----------+----------+--------------+   +---------+---------------+---------+-----------+----------+--------------+ LEFT     CompressibilityPhasicitySpontaneityPropertiesThrombus Aging +---------+---------------+---------+-----------+----------+--------------+ CFV      Full           Yes      Yes                                 +---------+---------------+---------+-----------+----------+--------------+ SFJ      Full                                                        +---------+---------------+---------+-----------+----------+--------------+ FV Prox  Full                                                        +---------+---------------+---------+-----------+----------+--------------+  FV Mid   Full                                                        +---------+---------------+---------+-----------+----------+--------------+ FV DistalFull                                                        +---------+---------------+---------+-----------+----------+--------------+ PFV      Full                                                        +---------+---------------+---------+-----------+----------+--------------+ POP      Full           Yes      Yes                                 +---------+---------------+---------+-----------+----------+--------------+ PTV      Full                                                         +---------+---------------+---------+-----------+----------+--------------+ PERO     Full                                                        +---------+---------------+---------+-----------+----------+--------------+     Summary: RIGHT: - There is no evidence of deep vein thrombosis in the lower extremity.  - No cystic structure found in the popliteal fossa.  LEFT: - There is no evidence of deep vein thrombosis in the lower extremity.  - No cystic structure found in the popliteal fossa.  *See table(s) above for measurements and observations. Electronically signed by Jamelle Haring on 03/04/2021 at 11:59:34 AM.    Final         Scheduled Meds:   stroke: mapping our early stages of recovery book   Does not apply Once   apixaban  5 mg Oral BID   bacitracin   Topical BID   diltiazem  30 mg Oral Q8H   fluticasone furoate-vilanterol  1 puff Inhalation Daily   And   umeclidinium bromide  1 puff Inhalation Daily   hydroxychloroquine  200 mg Oral BID   ipratropium-albuterol  3 mL Nebulization TID   leflunomide  20 mg Oral q morning   lidocaine  1 patch Transdermal Q24H   loratadine  10 mg Oral Daily   LORazepam  0.5 mg Intravenous Once   methylPREDNISolone (SOLU-MEDROL) injection  40 mg Intravenous Once   predniSONE  7.5 mg Oral Q breakfast   rOPINIRole  1 mg Oral QHS   rosuvastatin  20 mg Oral Daily   Continuous Infusions:  sodium chloride 50 mL/hr (03/04/21 1143)   cefTRIAXone (ROCEPHIN)  IV 1 g (03/04/21 1739)     LOS: 3 days    Time spent: 35 minutes.     Elmarie Shiley, MD [facility]   If 7PM-7AM, please contact night-coverage [email or web address]  03/05/2021, 8:55 AM

## 2021-03-05 NOTE — Progress Notes (Signed)
Dr. Tyrell Antonio made aware of fall, no new orders.

## 2021-03-06 ENCOUNTER — Other Ambulatory Visit (HOSPITAL_COMMUNITY): Payer: Self-pay

## 2021-03-06 LAB — GLUCOSE, CAPILLARY
Glucose-Capillary: 128 mg/dL — ABNORMAL HIGH (ref 70–99)
Glucose-Capillary: 127 mg/dL — ABNORMAL HIGH (ref 70–99)
Glucose-Capillary: 129 mg/dL — ABNORMAL HIGH (ref 70–99)
Glucose-Capillary: 106 mg/dL — ABNORMAL HIGH (ref 70–99)

## 2021-03-06 LAB — CBC
HCT: 32.6 % — ABNORMAL LOW (ref 36.0–46.0)
Hemoglobin: 10.8 g/dL — ABNORMAL LOW (ref 12.0–15.0)
MCH: 32.2 pg (ref 26.0–34.0)
MCHC: 33.1 g/dL (ref 30.0–36.0)
MCV: 97.3 fL (ref 80.0–100.0)
Platelets: 111 10*3/uL — ABNORMAL LOW (ref 150–400)
RBC: 3.35 MIL/uL — ABNORMAL LOW (ref 3.87–5.11)
RDW: 13.2 % (ref 11.5–15.5)
WBC: 11.9 10*3/uL — ABNORMAL HIGH (ref 4.0–10.5)
nRBC: 0 % (ref 0.0–0.2)

## 2021-03-06 LAB — BASIC METABOLIC PANEL
Anion gap: 8 (ref 5–15)
BUN: 14 mg/dL (ref 8–23)
CO2: 25 mmol/L (ref 22–32)
Calcium: 7.9 mg/dL — ABNORMAL LOW (ref 8.9–10.3)
Chloride: 105 mmol/L (ref 98–111)
Creatinine, Ser: 0.83 mg/dL (ref 0.44–1.00)
GFR, Estimated: >60 mL/min (ref >60)
Glucose, Bld: 125 mg/dL — ABNORMAL HIGH (ref 70–99)
Potassium: 3.5 mmol/L (ref 3.5–5.1)
Sodium: 138 mmol/L (ref 135–145)

## 2021-03-06 MED ORDER — POLYETHYLENE GLYCOL 3350 17 G PO PACK
17.0000 g | PACK | Freq: Two times a day (BID) | ORAL | Status: DC
Start: 2021-03-06 — End: 2021-03-09
  Administered 2021-03-06 – 2021-03-09 (×7): 17 g via ORAL
  Filled 2021-03-06 (×7): qty 1

## 2021-03-06 MED ORDER — POTASSIUM CHLORIDE CRYS ER 20 MEQ PO TBCR
40.0000 meq | EXTENDED_RELEASE_TABLET | Freq: Once | ORAL | Status: AC
  Administered 2021-03-06: 40 meq via ORAL
  Filled 2021-03-06: qty 2

## 2021-03-06 MED ORDER — DILTIAZEM HCL 60 MG PO TABS
60.0000 mg | ORAL_TABLET | Freq: Four times a day (QID) | ORAL | Status: DC
  Administered 2021-03-06 – 2021-03-07 (×5): 60 mg via ORAL
  Filled 2021-03-06 (×5): qty 1

## 2021-03-06 NOTE — TOC Progression Note (Signed)
Transition of Care [facility]) - Progression Note    Patient Details  Name: [name] MRN: [id number] Date of Birth: August 02, 1940  Transition of Care [facility]) CM/SW [place], LCSW Phone Number: 03/06/2021, 2:24 PM  Clinical Narrative:    CSW received consult for possible SNF placement at time of discharge. CSW spoke with patient's son. Patient's son reported that he is currently unable to care for patient at their home given patient's current physical needs and fall risk. He is agreeable to SNF placement at time of discharge. Patient reports preference for a [employer] CSW discussed insurance authorization process and will provide Medicare SNF ratings list. CSW provided bed offers. Patient has received COVID vaccines. Patient's son will contact CSW this afternoon or in the morning with their facility preference. CSW contacted [employer] to begin insurance process pending a facility choice.   Skilled Nursing Rehab Facilities-   [email or web address]   Ratings out of 5 possible   Name Address  Phone # [place] Inspection Overall  [facility] [address], [place] 5 5 2 4   [facility]  [address], [place] [phone] 4 2 5 5   [facility], [place] 4 1 1 1   [facility], [address] 2 2 4 4   [facility] [address], [place] 2 1 1 1   [facility] [address] [place] 3 1 4 3   [facility] [address] [place] 5 2 2 3   [facility] [address], [place] 4 1 2 1   [facility] [place] 5 1 2 2   [facility] [phone], [place] [phone] 4 1 1 1   [facility] [address], [facility] [phone] 4 1 2 1   [place] [facility] [phone] 4 1 1 1        Expected Discharge Plan: Skilled Nursing Facility Barriers to Discharge: Insurance Authorization  Expected Discharge Plan and Services Expected Discharge Plan: [place] In-house Referral: Clinical Social Work Discharge Planning Services: CM Consult Post Acute Care Choice: Durable Medical Equipment Living arrangements for the past 2 months: Single Family Home                 DME Arranged: Walker rolling DME Agency: [employer] Date DME Agency Contacted: 03/05/21 Time DME Agency Contacted: [phone] Representative spoke with at DME Agency: Bronwen Betters             Social Determinants of Health ([place]) Interventions    Readmission Risk Interventions No flowsheet data found.

## 2021-03-06 NOTE — Progress Notes (Signed)
Inpatient Rehab Admissions Coordinator:   I spoke with pt.'s son Shanon Brow and he states family remains unsure of whether or not they can provide 24/7 support at d/c. TOC to reach out to discuss SNF as well while family decides.   Clemens Catholic, [place], [place] Admissions Coordinator  [phone] ([place]) [phone] (office)

## 2021-03-06 NOTE — Progress Notes (Signed)
Inpatient Rehab Admissions Coordinator:   Per TOC, Pt.'s family prefers SNF. CIR will sign off.   Clemens Catholic, [place], [place] Admissions Coordinator  [phone] ([place]) [phone] (office)

## 2021-03-06 NOTE — Progress Notes (Signed)
ANTICOAGULATION CONSULT NOTE - follow up [place] for apixaban dosing Indication: atrial fibrillation  Allergies  Allergen Reactions   Shellfish Allergy Anaphylaxis    With vomiting and diarrhea   Atorvastatin Other (See Comments)    Leg muscle pain   Sertraline Hcl     Unknown reaction   Macrolides And Ketolides Rash   Penicillins Other (See Comments)    Facial numbness   Sulfa Antibiotics Other (See Comments)    Facial numbness    Patient Measurements: Height: 5\' 7"  (170.2 cm) Weight: 121.2 kg (267 lb 3.2 oz) IBW/kg (Calculated) : 61.6  Vital Signs: Temp: 98.1 F (36.7 C) (11/22 0807) Temp Source: Oral (11/22 0807) BP: 118/94 (11/22 0807) Pulse Rate: 95 (11/22 0807)  Labs: Recent Labs    03/04/21 0647 03/04/21 0708 03/04/21 0708 03/05/21 0125 03/06/21 0047  HGB  --  11.2*   < > 11.3* 10.8*  HCT  --  34.2*  --  35.4* 32.6*  PLT  --  126*  --  120* 111*  CREATININE 1.01*  --   --  0.81 0.83  TROPONINIHS 66*  --   --   --   --    < > = values in this interval not displayed.     Estimated Creatinine Clearance: 71.7 mL/min (by C-G formula based on SCr of 0.83 mg/dL).   Medical History: Past Medical History:  Diagnosis Date   Allergic rhinitis    Asthma    Asthmatic bronchitis    Colon polyps    COPD (chronic obstructive pulmonary disease) (HCC)    Diverticulosis    GERD (gastroesophageal reflux disease)    H/O: GI bleed    Hypercholesterolemia    Migraine headache    Obesity    RLS (restless legs syndrome)     Medications:  See MAR  Assessment: Stroke patient with no prior history of atrial fibrillation found to have atrial fibrillation during this admission. Neuro to transition from asa and Plavix to Eliquis. CBC is stable and there are no documented signs of bleeding.  >80 years old, but SCr <1.5 and >60 kg, so 5 mg BID dosing.   Plan:  Continue apixaban 5 mg BID  Monitor CBC and signs of bleeding Educated, copay  $45/month-okay per patient Pharmacy will sign off and monitor peripherally    Thank you for allowing [place] to participate in this patients care. Jens Som, PharmD 03/06/2021 10:49 AM  **Pharmacist phone directory can be found on [email or web address] listed under [place]**

## 2021-03-06 NOTE — Progress Notes (Signed)
Physical Therapy Treatment Patient Details Name: [name] MRN: [id number] DOB: [birth year] Today's Date: 03/06/2021   History of Present Illness 80 y.o. female admitted 03/02/2021 after being found down at home by son. CT head demonstrates R MCA stroke with R carotid artery dissection. Pt also with left 6-7 rib fx. PMH includes COPD/asthma, hypertension, rheumatoid arthritis, GERD, obstructive sleep apnea.    PT Comments    Pt initially fearful of mobility but reassured with +2 assist and reassurance with direction throughout. Pt able to progress to standing and limited gait today. Pt with HR up to 130 with activity back to 110 at rest with fatigue and nausea limiting distance. Pt with left inattention and LUE ataxia with increased time to attend to environment on left and manipulate items with LUE. Family educated for visiting to left and encouraging use of LUE. D/C plan remains appropriate.   HR 96-130 SPO2 95% on 3L    Recommendations for follow up therapy are one component of a multi-disciplinary discharge planning process, led by the attending physician.  Recommendations may be updated based on patient status, additional functional criteria and insurance authorization.  Follow Up Recommendations  Acute inpatient rehab (3hours/day)     Assistance Recommended at Discharge Frequent or constant Supervision/Assistance  Equipment Recommendations  Rolling walker (2 wheels)    Recommendations for Other Services Rehab consult     Precautions / Restrictions Precautions Precautions: Fall Precaution Comments: L inattention     Mobility  Bed Mobility Overal bed mobility: Needs Assistance Bed Mobility: Supine to Sit     Supine to sit: Mod assist;HOB elevated     General bed mobility comments: HOB 20 degrees, cues for sequence to roll right and rise from side. Mod assist to complete roll, min assist to elevate trunk and increased time with multimodal cues to clear legs off  surface. Min assist to scoot to EOB    Transfers Overall transfer level: Needs assistance   Transfers: Sit to/from Stand;Bed to chair/wheelchair/BSC Sit to Stand: Min assist;+2 safety/equipment     Step pivot transfers: Min assist;+2 safety/equipment     General transfer comment: pt with cues for hand placement with LUE on bil therapist arms to initially rise from bed and pivot to chair with cues for sequence and safety. Decreased ability to control LUE for pushing off surface and reaching to chair    Ambulation/Gait Ambulation/Gait assistance: Min assist;+2 safety/equipment Gait Distance (Feet): 8 Feet Assistive device: Rolling walker (2 wheels) Gait Pattern/deviations: Step-through pattern;Decreased stride length;Trunk flexed   Gait velocity interpretation: <1.8 ft/sec, indicate of risk for recurrent falls   General Gait Details: min assist to control RW and for lines with cues for safety and sequence with close chair follow and reassurance. HR up to 130 with gait   Stairs             Wheelchair Mobility    Modified Rankin (Stroke Patients Only) Modified Rankin (Stroke Patients Only) Pre-Morbid Rankin Score: Slight disability Modified Rankin: Moderately severe disability     Balance Overall balance assessment: Needs assistance Sitting-balance support: No upper extremity supported;Feet supported Sitting balance-Leahy Scale: Good Sitting balance - Comments: EOb with supervision for safety   Standing balance support: Bilateral upper extremity supported Standing balance-Leahy Scale: Poor Standing balance comment: bil UE support on therapist/tech or RW                            Cognition Arousal/Alertness: Awake/alert Behavior  During Therapy: WFL for tasks assessed/performed Overall Cognitive Status: Impaired/Different from baseline Area of Impairment: Attention;Memory;Following commands;Safety/judgement;Problem solving;Awareness                  Orientation Level: Time Current Attention Level: Sustained Memory: Decreased short-term memory Following Commands: Follows one step commands inconsistently;Follows one step commands with increased time Safety/Judgement: Decreased awareness of safety;Decreased awareness of deficits Awareness: Intellectual Problem Solving: Slow processing;Difficulty sequencing;Requires verbal cues;Requires tactile cues General Comments: pt with left inattention, decreased safety awareness and ataxia of LUE making command following with LUE challenging. pt fearful of falling and benefits from reassurance        Exercises      General Comments        Pertinent Vitals/Pain Pain Assessment: 0-10 Pain Score: 5  Pain Location: left chest Pain Descriptors / Indicators: Tender;Sore Pain Intervention(s): Limited activity within patient's tolerance;Monitored during session;Repositioned    Home Living                          Prior Function            PT Goals (current goals can now be found in the care plan section) Progress towards PT goals: Progressing toward goals    Frequency    Min 4X/week      PT Plan Current plan remains appropriate    Co-evaluation              AM-PAC PT "6 Clicks" Mobility   Outcome Measure  Help needed turning from your back to your side while in a flat bed without using bedrails?: A Lot Help needed moving from lying on your back to sitting on the side of a flat bed without using bedrails?: A Lot Help needed moving to and from a bed to a chair (including a wheelchair)?: A Lot Help needed standing up from a chair using your arms (e.g., wheelchair or bedside chair)?: A Lot Help needed to walk in hospital room?: Total Help needed climbing 3-5 steps with a railing? : Total 6 Click Score: 10    End of Session Equipment Utilized During Treatment: Gait belt;Oxygen Activity Tolerance: Patient tolerated treatment well Patient left: in chair;with  call bell/phone within reach;with chair alarm set;with family/visitor present;with nursing/sitter in room Nurse Communication: Mobility status PT Visit Diagnosis: Other abnormalities of gait and mobility (R26.89);Muscle weakness (generalized) (M62.81);Other symptoms and signs involving the nervous system (R29.898)     Time: 3785-8850 PT Time Calculation (min) (ACUTE ONLY): 32 min  Charges:  $Gait Training: 8-22 mins $Therapeutic Activity: 8-22 mins                     Floreine Kingdon P, PT Acute Rehabilitation Services Pager: [phone] Office: [place] 03/06/2021, 10:19 AM

## 2021-03-06 NOTE — NC FL2 (Signed)
[place] MEDICAID FL2 LEVEL OF CARE SCREENING TOOL     IDENTIFICATION  Patient Name: [name] Birthdate: 05-03-1940 Sex: female Admission Date (Current Location): 03/02/2021  [facility] and [place] Number:  [employer] and Address:  [facility], [place] [address], [place],  [address]      Provider Number: [id number]  Attending Physician Name and Address:  Elmarie Shiley, MD  Relative Name and Phone Number:       Current Level of Care: Hospital Recommended Level of Care: [place] Prior Approval Number:    Date Approved/Denied:   PASRR Number: [id number]  Discharge Plan: SNF    Current Diagnoses: Patient Active Problem List   Diagnosis Date Noted   Acute ischemic right MCA stroke ([place]) 03/02/2021   Dissection of right carotid artery ([place]) 03/02/2021   Rib fractures 03/02/2021   OSA on CPAP 08/26/2020   Essential hypertension 06/20/2020   H/O TB skin testing 06/20/2020   Acute lower UTI 06/12/2020   COVID-19 06/11/2020   Chronic respiratory failure with hypoxia ([place]) 06/01/2019   COPD mixed type ([place]) 03/14/2015   Rheumatoid arthritis ([place]) 03/14/2015   Dyspnea 02/06/2015   ILD (interstitial lung disease) ([place]) 02/06/2015   Cough 05/17/2011    Orientation RESPIRATION BLADDER Height & Weight     Self, Time, Situation, Place  O2 (Nasal cannula 3L) Incontinent, External catheter Weight: 267 lb 3.2 oz (121.2 kg) Height:  5\' 7"  (170.2 cm)  BEHAVIORAL SYMPTOMS/MOOD NEUROLOGICAL BOWEL NUTRITION STATUS      Continent Diet (See DC Summary)  AMBULATORY STATUS COMMUNICATION OF NEEDS Skin   Limited Assist Verbally Other (Comment) (wound on abdomen and sacrum; on pretibial w/guaze)                       Personal Care Assistance Level of Assistance  Bathing, Feeding, Dressing Bathing Assistance: Maximum assistance Feeding assistance: Limited assistance Dressing Assistance: Limited assistance      Functional Limitations Info             SPECIAL CARE FACTORS FREQUENCY  PT (By licensed PT), OT (By licensed OT)     PT Frequency: 5x/week OT Frequency: 5x/week            Contractures Contractures Info: Not present    Additional Factors Info  Code Status, Allergies Code Status Info: DNR Allergies Info: Shellfish Allergy, Atorvastatin, Sertraline Hcl, Macrolides And Ketolides, Penicillins, Sulfa Antibiotics           Current Medications (03/06/2021):  This is the current hospital active medication list Current Facility-Administered Medications  Medication Dose Route Frequency Provider Last Rate Last Admin    stroke: mapping our early stages of recovery book   Does not apply Once Truett Mainland, DO       0.9 %  sodium chloride infusion   Intravenous Continuous Regalado, Belkys A, MD 50 mL/hr at 03/06/21 0652 New Bag at 03/06/21 6333   acetaminophen (TYLENOL) tablet 650 mg  650 mg Oral Q4H PRN Truett Mainland, DO   650 mg at 03/03/21 5456   Or   acetaminophen (TYLENOL) suppository 650 mg  650 mg Rectal Q4H PRN Truett Mainland, DO       apixaban Arne [place]) tablet 5 mg  5 mg Oral BID Regalado, Belkys A, MD   5 mg at 03/06/21 0904   bacitracin ointment   Topical BID Truett Mainland, DO   Given at 03/06/21 [phone]  bisacodyl (DULCOLAX) suppository 10 mg  10 mg Rectal PRN Truett Mainland, DO       cefTRIAXone (ROCEPHIN) 1 g in sodium chloride 0.9 % 100 mL IVPB  1 g Intravenous Q24H Regalado, Belkys A, MD 200 mL/hr at 03/05/21 1727 1 g at 03/05/21 1727   diltiazem (CARDIZEM) tablet 60 mg  60 mg Oral Q6H Shalhoub, Sherryll Burger, MD   60 mg at 03/06/21 0624   fluticasone furoate-vilanterol (BREO ELLIPTA) 200-25 MCG/ACT 1 puff  1 puff Inhalation Daily Truett Mainland, DO   1 puff at 03/06/21 0855   And   umeclidinium bromide (INCRUSE ELLIPTA) 62.5 MCG/ACT 1 puff  1 puff Inhalation Daily Truett Mainland, DO   1 puff at 03/06/21 0855   HYDROcodone-acetaminophen (NORCO/VICODIN)  5-325 MG per tablet 1-2 tablet  1-2 tablet Oral Q4H PRN Regalado, Belkys A, MD   1 tablet at 03/06/21 1016   hydroxychloroquine (PLAQUENIL) tablet 200 mg  200 mg Oral BID Truett Mainland, DO   200 mg at 03/06/21 7741   ipratropium-albuterol (DUONEB) 0.5-2.5 (3) MG/3ML nebulizer solution 3 mL  3 mL Nebulization Q4H PRN Regalado, Belkys A, MD       leflunomide (ARAVA) tablet 20 mg  20 mg Oral q morning Truett Mainland, DO   20 mg at 03/06/21 0906   lidocaine (LIDODERM) 5 % 1 patch  1 patch Transdermal Q24H Regalado, Belkys A, MD   1 patch at 03/06/21 0902   loratadine (CLARITIN) tablet 10 mg  10 mg Oral Daily Truett Mainland, DO   10 mg at 03/06/21 0903   ondansetron (ZOFRAN) injection 4 mg  4 mg Intravenous Q6H PRN Vernelle Emerald, MD   4 mg at 03/06/21 1007   predniSONE (DELTASONE) tablet 7.5 mg  7.5 mg Oral Q breakfast Truett Mainland, DO   7.5 mg at 03/06/21 0855   rOPINIRole (REQUIP) tablet 1 mg  1 mg Oral QHS Truett Mainland, DO   1 mg at 03/05/21 2020   rosuvastatin (CRESTOR) tablet 20 mg  20 mg Oral Daily Truett Mainland, DO   20 mg at 03/06/21 2878   senna-docusate (Senokot-S) tablet 1 tablet  1 tablet Oral QHS PRN Truett Mainland, DO       sodium chloride (OCEAN) 0.65 % nasal spray 1 spray  1 spray Each Nare PRN Regalado, Belkys A, MD         Discharge Medications: Please see discharge summary for a list of discharge medications.  Relevant Imaging Results:  Relevant Lab Results:   Additional Information SSN: [id number]. Moderna COVID-19 Vaccine 07/04/2019 , 06/11/2019 , 05/06/2019  Benard Halsted, LCSW

## 2021-03-06 NOTE — Progress Notes (Signed)
Cardizem 90 mg dose ordered at midnight but heart only slightly tachy and bp normal, concern the pt will drop bp with med , nurse text MD and received text back to only give 60 mg dose instead

## 2021-03-06 NOTE — Progress Notes (Signed)
Discussed with Son, he relates he will be able to provide care for his mother after CIR stay. He would like to proceed with CIR admission>   -I will consult CIR again.

## 2021-03-06 NOTE — TOC Benefit Eligibility Note (Signed)
Patient Teacher, English as a foreign language completed.    The patient is currently admitted and upon discharge could be taking Eliquis 5mg  tabs.  The current 30 day co-pay is $45.   The patient is insured through [employer].    Sharlette Dense, CPhT Pharmacy Patient Advocate Specialist [place] Patient Advocate Team Direct Number: [phone]  Fax: [phone]

## 2021-03-06 NOTE — Progress Notes (Addendum)
PROGRESS NOTE    [name]  INO:[id number] DOB: 1941-04-08 DOA: 03/02/2021 PCP: Lajean Manes, MD   Brief Narrative: 80 year old with past medical history significant for COPD/asthma, hypertension, rheumatoid arthritis, GERD, obstructive sleep apnea who was found down on the floor by her son earlier the day of admission 03/02/2021.  Patient was last known well around 10 PM tonight prior to admission when she went to bed.  Patient does not remember when she got up, but stated she got up in order to use the bathroom but was unable to get to her walker and fell.  She reported having difficulty moving the left side of her body.  Son tried to get in touch with her and she did not answer, he broke into her house and found her on the ground.  Evaluation in the ED CT head showed right MCA stroke with right carotid artery dissection.  Patient was also found to have A fib. Started on eliquis and rate controlling agent.    Assessment & Plan:   Principal Problem:   Acute ischemic right MCA stroke (HCC) Active Problems:   COPD mixed type (HCC)   Rheumatoid arthritis ([place])   Acute lower UTI   Essential hypertension   OSA on CPAP   Dissection of right carotid artery (HCC)   Rib fractures   1-Acute Right MCA Stroke:  Likely embolic secondary to possible right ICA dissection with right M2 occlusion and small vessel disease source. MRI: Large acute posterior right MCA territory infarct.  Small focus of acute ischemia in the left centrum semiovale old.  Superiorly directed supraclinoid right ICA aneurysm. CTA: Acute right M2 MCA occlusion.  Dissection of the right internal carotid artery at the skull base with visible intimal flap, a small pseudoaneurysm and patent false and true lumens.  Superiorly 80 x 10 mm right supraclinoid ICA aneurysmal.  Bilateral carotid bifurcation atherosclerosis with 40% stenosis of the proximal right ICA. LDL: 74 A1c: 0.4 2D echo: EF 65 %. Grade 1 Diastolic  Dysfunction.  PT recommended CIR Develop A fib RVR last night. Ok to start Eliquis per neurology. Stop aspirin and plavix.  Tolerating eliquis.   2-Right Carotid Artery dissection:  Neurology discussed with neuro interventionist.  No thrombectomy due to very high risk for hemorrhagic transformation the dissection does not appear to be flow-limiting or any sort of thrombus large. Due to absence of any thrombus no need for IV heparin.  Further management per neurology  COPD: Possible exacerbation Patient report worsening shortness of breath.  She is not able to take deep breaths. Of shortness of breath related to rib fracture.  With we will proceed with IVF steroids, scheduled nebulizer -CT chest 11/18: Patient with prominent fibrotic changes in both lung, overall not significantly progressed since 2021.  Patchy opacity in the lateral left base and lingula reflect additional areas of scar or atelectasis. -On Breo-incruse.  -On chronic prednisone.  -she has received 2 doses of IV steroids. Breathing better today. Received one dose lasix 11/21.  Positive D dimer:  -Doppler LE negative for DVT.  -CTA  Negative for PE>   A fib RVR;  Plan to change metoprolol to cardizem, to avoid BB effect to COPD>  Ok to start Eliquis per neurology./  Cardizem increase to 60 mg every 6 hour overnight.   Anterior Rib Fx:  Patient complaining of rib pain and shortness of breath.  Continue with lidocaine patch, change Vicodin to Q 4 HR PRN Incentive spirometry.  UTI; Presented with  UA with more than 50 white blood cell positive nitrate Follow urine culture Continue with IV ceftriaxone total 5 days tx.    HTN: Permissive hypertension normalizing 5 to 7 days Holding Norvasc and lisinopril Started Cardizem  for A fib.   Leukocytosis; related to steroids.   RA; On Plaquenil, [place].   OSA on CPAP:  CPAP ordered.   Morbid Obesity; needs life style modification.       Estimated body mass index  is 41.85 kg/m as calculated from the following:   Height as of this encounter: 5\' 7"  (1.702 m).   Weight as of this encounter: 121.2 kg.   DVT prophylaxis: SCD Code Status: DNR, discussed with son who was at bedside.  Family Communication: Son at bedside updated.  Disposition Plan:  Status is: Inpatient  Remains inpatient appropriate because: admitted with stroke. Plan for SNF 11/23 if HR controlled.     Consultants:  Neurology   Procedures:  ECHO pending  Antimicrobials:  Ceftriaxone  Subjective: She is breathing better, still with chest pain, ribs pain.  No BM    Objective: Vitals:   03/06/21 0600 03/06/21 0624 03/06/21 0807 03/06/21 1215  BP: (!) 145/87 (!) 145/87 (!) 118/94 (!) 152/77  Pulse: (!) 114  95 99  Resp: (!) 21  (!) 22 18  Temp: 97.9 F (36.6 C)  98.1 F (36.7 C) 98.3 F (36.8 C)  TempSrc: Oral  Oral Oral  SpO2: 98%  97% 100%  Weight:      Height:        Intake/Output Summary (Last 24 hours) at 03/06/2021 1430 Last data filed at 03/06/2021 0239 Gross per 24 hour  Intake --  Output 1900 ml  Net -1900 ml    Filed Weights   03/02/21 2301 03/06/21 0500  Weight: 122.5 kg 121.2 kg    Examination:  General exam: NAD Respiratory system: CTA Cardiovascular system: S 1,. S 2 IRR Gastrointestinal system: BS present, soft, nt Central nervous system: Alert, follows command, left side neglect Extremities: No edema   Data Reviewed: I have personally reviewed following labs and imaging studies  CBC: Recent Labs  Lab 03/02/21 1158 03/04/21 0708 03/05/21 0125 03/06/21 0047  WBC 15.8* 18.1* 18.6* 11.9*  NEUTROABS 11.3*  --   --   --   HGB 12.4 11.2* 11.3* 10.8*  HCT 38.4 34.2* 35.4* 32.6*  MCV 101.6* 98.8 99.2 97.3  PLT 135* 126* 120* 111*    Basic Metabolic Panel: Recent Labs  Lab 03/02/21 1158 03/04/21 0647 03/05/21 0125 03/06/21 0047  NA 141 137 138 138  K 4.0 4.2 4.0 3.5  CL 105 104 104 105  CO2 23 24 27 25   GLUCOSE 142*  121* 103* 125*  BUN 19 15 13 14   CREATININE 0.94 1.01* 0.81 0.83  CALCIUM 8.5* 8.4* 8.1* 7.9*  MG  --  1.8  --   --     GFR: Estimated Creatinine Clearance: 71.7 mL/min (by C-G formula based on SCr of 0.83 mg/dL). Liver Function Tests: Recent Labs  Lab 03/02/21 1158  AST 30  ALT 17  ALKPHOS 63  BILITOT 0.9  PROT 6.7  ALBUMIN 3.6    No results for input(s): LIPASE, AMYLASE in the last 168 hours. No results for input(s): AMMONIA in the last 168 hours. Coagulation Profile: Recent Labs  Lab 03/02/21 1158  INR 0.9    Cardiac Enzymes: No results for input(s): CKTOTAL, CKMB, CKMBINDEX, TROPONINI in the last 168 hours. BNP (last 3 results) No  results for input(s): PROBNP in the last 8760 hours. HbA1C: No results for input(s): HGBA1C in the last 72 hours.  CBG: Recent Labs  Lab 03/05/21 1155 03/05/21 1625 03/05/21 2042 03/06/21 0809 03/06/21 1217  GLUCAP 138* 177* 192* 128* 127*    Lipid Profile: No results for input(s): CHOL, HDL, LDLCALC, TRIG, CHOLHDL, LDLDIRECT in the last 72 hours.  Thyroid Function Tests: Recent Labs    03/04/21 0647  TSH 1.735    Anemia Panel: No results for input(s): VITAMINB12, FOLATE, FERRITIN, TIBC, IRON, RETICCTPCT in the last 72 hours. Sepsis Labs: No results for input(s): PROCALCITON, LATICACIDVEN in the last 168 hours.  Recent Results (from the past 240 hour(s))  Resp Panel by RT-PCR (Flu A&B, Covid) Nasopharyngeal Swab     Status: None   Collection Time: 03/02/21  3:30 PM   Specimen: Nasopharyngeal Swab; Nasopharyngeal(NP) swabs in vial transport medium  Result Value Ref Range Status   SARS Coronavirus 2 by RT PCR NEGATIVE NEGATIVE Final    Comment: (NOTE) SARS-CoV-2 target nucleic acids are NOT DETECTED.  The SARS-CoV-2 RNA is generally detectable in upper respiratory specimens during the acute phase of infection. The lowest concentration of SARS-CoV-2 viral copies this assay can detect is 138 copies/mL. A negative  result does not preclude SARS-Cov-2 infection and should not be used as the sole basis for treatment or other patient management decisions. A negative result may occur with  improper specimen collection/handling, submission of specimen other than nasopharyngeal swab, presence of viral mutation(s) within the areas targeted by this assay, and inadequate number of viral copies(<138 copies/mL). A negative result must be combined with clinical observations, patient history, and epidemiological information. The expected result is Negative.  Fact Sheet for Patients:  EntrepreneurPulse.com.au  Fact Sheet for Healthcare Providers:  IncredibleEmployment.be  This test is no t yet approved or cleared by the [place] FDA and  has been authorized for detection and/or diagnosis of SARS-CoV-2 by FDA under an Emergency Use Authorization (EUA). This EUA will remain  in effect (meaning this test can be used) for the duration of the COVID-19 declaration under Section 564(b)(1) of the Act, 21 U.S.C.section 360bbb-3(b)(1), unless the authorization is terminated  or revoked sooner.       Influenza A by PCR NEGATIVE NEGATIVE Final   Influenza B by PCR NEGATIVE NEGATIVE Final    Comment: (NOTE) The Xpert Xpress SARS-CoV-2/FLU/RSV plus assay is intended as an aid in the diagnosis of influenza from Nasopharyngeal swab specimens and should not be used as a sole basis for treatment. Nasal washings and aspirates are unacceptable for Xpert Xpress SARS-CoV-2/FLU/RSV testing.  Fact Sheet for Patients: EntrepreneurPulse.com.au  Fact Sheet for Healthcare Providers: IncredibleEmployment.be  This test is not yet approved or cleared by the [place] FDA and has been authorized for detection and/or diagnosis of SARS-CoV-2 by FDA under an Emergency Use Authorization (EUA). This EUA will remain in effect (meaning this test can be used)  for the duration of the COVID-19 declaration under Section 564(b)(1) of the Act, 21 U.S.C. section 360bbb-3(b)(1), unless the authorization is terminated or revoked.  Performed at [facility], [address] [place],  [address]   Urine Culture     Status: None   Collection Time: 03/04/21  1:52 PM   Specimen: Urine, Clean Catch  Result Value Ref Range Status   Specimen Description URINE, CLEAN CATCH  Final   Special Requests NONE  Final   Culture   Final    NO GROWTH Performed  at [facility], [place] [address] [place], [place] [address]    Report Status 03/05/2021 FINAL  Final          Radiology Studies: CT Angio Chest Pulmonary Embolism (PE) W or WO Contrast  Result Date: 03/04/2021 CLINICAL DATA:  Chest pain, shortness of breath. Possible effusion. Fall. EXAM: CT ANGIOGRAPHY CHEST WITH CONTRAST TECHNIQUE: Multidetector CT imaging of the chest was performed using the standard protocol during bolus administration of intravenous contrast. Multiplanar CT image reconstructions and MIPs were obtained to evaluate the vascular anatomy. CONTRAST:  36mL OMNIPAQUE IOHEXOL 350 MG/ML SOLN COMPARISON:  03/02/2021 FINDINGS: Cardiovascular: Mild cardiomegaly. Calcification over the mitral valve annulus. Calcified plaque over the left main and 3 vessel coronary arteries. Thoracic aorta is normal caliber. There is calcified plaque over the thoracic aorta. Pulmonary arterial system is well opacified and demonstrates no evidence of emboli. Remaining vascular structures are unremarkable. Mediastinum/Nodes: No mediastinal or hilar adenopathy. Remaining mediastinal structures are normal. Lungs/Pleura: Lungs are adequately inflated without focal airspace consolidation or effusion. Minimal stable patchy fibrotic change. Airways are normal. Upper Abdomen: Calcified plaque over the abdominal aorta. High density material within the gallbladder likely contrast excretion. Musculoskeletal: Spondylosis of  the thoracic spine. Review of the MIP images confirms the above findings. IMPRESSION: 1. No acute cardiopulmonary disease and no evidence of pulmonary embolism. 2. Patchy stable pulmonary fibrotic changes. 3. Mild cardiomegaly. Atherosclerotic coronary artery disease. 4. Aortic atherosclerosis. Aortic Atherosclerosis (ICD10-I70.0). Electronically Signed   By: Marin Olp M.D.   On: 03/04/2021 15:12        Scheduled Meds:   stroke: mapping our early stages of recovery book   Does not apply Once   apixaban  5 mg Oral BID   bacitracin   Topical BID   diltiazem  60 mg Oral Q6H   fluticasone furoate-vilanterol  1 puff Inhalation Daily   And   umeclidinium bromide  1 puff Inhalation Daily   hydroxychloroquine  200 mg Oral BID   leflunomide  20 mg Oral q morning   lidocaine  1 patch Transdermal Q24H   loratadine  10 mg Oral Daily   potassium chloride  40 mEq Oral Once   predniSONE  7.5 mg Oral Q breakfast   rOPINIRole  1 mg Oral QHS   rosuvastatin  20 mg Oral Daily   Continuous Infusions:  sodium chloride 50 mL/hr at 03/06/21 0652   cefTRIAXone (ROCEPHIN)  IV 1 g (03/05/21 1727)     LOS: 4 days    Time spent: 35 minutes.     Elmarie Shiley, MD [facility]   If 7PM-7AM, please contact night-coverage [email or web address]  03/06/2021, 2:30 PM

## 2021-03-07 DIAGNOSIS — N390 Urinary tract infection, site not specified: Secondary | ICD-10-CM

## 2021-03-07 DIAGNOSIS — R5381 Other malaise: Secondary | ICD-10-CM

## 2021-03-07 DIAGNOSIS — I63412 Cerebral infarction due to embolism of left middle cerebral artery: Secondary | ICD-10-CM

## 2021-03-07 LAB — BASIC METABOLIC PANEL
Anion gap: 10 (ref 5–15)
BUN: 18 mg/dL (ref 8–23)
CO2: 24 mmol/L (ref 22–32)
Calcium: 8.2 mg/dL — ABNORMAL LOW (ref 8.9–10.3)
Chloride: 103 mmol/L (ref 98–111)
Creatinine, Ser: 0.87 mg/dL (ref 0.44–1.00)
GFR, Estimated: >60 mL/min (ref >60)
Glucose, Bld: 106 mg/dL — ABNORMAL HIGH (ref 70–99)
Potassium: 3.9 mmol/L (ref 3.5–5.1)
Sodium: 137 mmol/L (ref 135–145)

## 2021-03-07 LAB — GLUCOSE, CAPILLARY
Glucose-Capillary: 80 mg/dL (ref 70–99)
Glucose-Capillary: 106 mg/dL — ABNORMAL HIGH (ref 70–99)
Glucose-Capillary: 131 mg/dL — ABNORMAL HIGH (ref 70–99)
Glucose-Capillary: 118 mg/dL — ABNORMAL HIGH (ref 70–99)

## 2021-03-07 MED ORDER — DILTIAZEM HCL ER COATED BEADS 240 MG PO CP24
240.0000 mg | ORAL_CAPSULE | Freq: Every day | ORAL | Status: DC
  Administered 2021-03-07: 240 mg via ORAL
  Filled 2021-03-07: qty 1

## 2021-03-07 MED ORDER — HYDROCODONE-ACETAMINOPHEN 5-325 MG PO TABS
1.0000 | ORAL_TABLET | Freq: Four times a day (QID) | ORAL | Status: DC | PRN
  Administered 2021-03-08 – 2021-03-09 (×4): 1 via ORAL
  Filled 2021-03-07 (×4): qty 1

## 2021-03-07 MED ORDER — DILTIAZEM HCL 60 MG PO TABS
30.0000 mg | ORAL_TABLET | Freq: Four times a day (QID) | ORAL | Status: DC | PRN
  Administered 2021-03-08: 30 mg via ORAL
  Filled 2021-03-07: qty 1

## 2021-03-07 NOTE — Progress Notes (Signed)
Physical Therapy Treatment Patient Details Name: [name] MRN: [id number] DOB: [birth year] Today's Date: 03/07/2021   History of Present Illness 80 y.o. female admitted 03/02/2021 after being found down at home by son. CT head demonstrates R MCA stroke with R carotid artery dissection. Pt also with left 6-7 rib fx. PMH includes COPD/asthma, hypertension, rheumatoid arthritis, GERD, obstructive sleep apnea.    PT Comments    Pt tolerates treatment well despite significant anxiety due to fear of falling. Pt requires increased time for most activities during session but is able to mobilize with limited physical assistance. Pt benefits from verbal and tactile cues to increase attention to L side. Pt remains at a high falls risk due to L inattention. Per chart review pt does not have 24/7 supervision at home, thus CIR at [facility] has signed off. Would be worth considering other inpatient rehab programs that will accept patients with caregiver support, but not 24/7 assistance. If family would prefer SNF placement rather than AIR/CIR then this venue may also be sufficient.  Recommendations for follow up therapy are one component of a multi-disciplinary discharge planning process, led by the attending physician.  Recommendations may be updated based on patient status, additional functional criteria and insurance authorization.  Follow Up Recommendations  Skilled nursing-short term rehab (<3 hours/day)     Assistance Recommended at Discharge Frequent or constant Supervision/Assistance  Equipment Recommendations  Wheelchair (measurements PT) (TBD)    Recommendations for Other Services       Precautions / Restrictions Precautions Precautions: Fall Precaution Comments: L inattention Restrictions Weight Bearing Restrictions: No     Mobility  Bed Mobility Overal bed mobility: Needs Assistance Bed Mobility: Sit to Supine     Supine to sit: Mod assist;HOB elevated Sit to supine: Min  assist   General bed mobility comments: significant increase in time    Transfers Overall transfer level: Needs assistance Equipment used: Rolling walker (2 wheels) Transfers: Sit to/from Stand Sit to Stand: Min assist     Step pivot transfers: Mod assist     General transfer comment: mod assist of one with verbal cues throughout for safety and technique    Ambulation/Gait Ambulation/Gait assistance: Min assist Gait Distance (Feet): 12 Feet Assistive device: Rolling walker (2 wheels) Gait Pattern/deviations: Step-to pattern Gait velocity: reduced Gait velocity interpretation: <1.8 ft/sec, indicate of risk for recurrent falls   General Gait Details: pt with slowed step-to gait, requires verbal cues when approaching bed to guide pt due to impaired L sided awareness   Stairs             Wheelchair Mobility    Modified Rankin (Stroke Patients Only) Modified Rankin (Stroke Patients Only) Pre-Morbid Rankin Score: Slight disability Modified Rankin: Moderately severe disability     Balance Overall balance assessment: Needs assistance Sitting-balance support: No upper extremity supported;Feet supported Sitting balance-Leahy Scale: Fair Sitting balance - Comments: no assistance needed for sitting balance   Standing balance support: Bilateral upper extremity supported Standing balance-Leahy Scale: Poor Standing balance comment: reliant on walker                            Cognition Arousal/Alertness: Awake/alert Behavior During Therapy: Anxious Overall Cognitive Status: Impaired/Different from baseline Area of Impairment: Safety/judgement;Awareness;Problem solving                 Orientation Level: Time Current Attention Level: Sustained Memory: Decreased short-term memory Following Commands: Follows one step commands inconsistently;Follows  one step commands with increased time Safety/Judgement: Decreased awareness of safety;Decreased  awareness of deficits Awareness: Intellectual Problem Solving: Slow processing;Difficulty sequencing;Requires verbal cues;Requires tactile cues General Comments: requires frequent cues to attend to left. followed directions when given slowly and increased time to follow        Exercises      General Comments General comments (skin integrity, edema, etc.): pt on 3L [place], tachycardia into 130s      Pertinent Vitals/Pain Pain Assessment: Faces Faces Pain Scale: Hurts even more Pain Location: chest/ribs Pain Descriptors / Indicators: Grimacing Pain Intervention(s): Monitored during session    Home Living                          Prior Function            PT Goals (current goals can now be found in the care plan section) Acute Rehab PT Goals Patient Stated Goal: to return to independent mobility Progress towards PT goals: Progressing toward goals    Frequency    Min 3X/week      PT Plan Frequency needs to be updated;Discharge plan needs to be updated    Co-evaluation              AM-PAC PT "6 Clicks" Mobility   Outcome Measure  Help needed turning from your back to your side while in a flat bed without using bedrails?: A Little Help needed moving from lying on your back to sitting on the side of a flat bed without using bedrails?: A Little Help needed moving to and from a bed to a chair (including a wheelchair)?: A Little Help needed standing up from a chair using your arms (e.g., wheelchair or bedside chair)?: A Little Help needed to walk in hospital room?: A Lot Help needed climbing 3-5 steps with a railing? : Total 6 Click Score: 15    End of Session Equipment Utilized During Treatment: Oxygen;Gait belt Activity Tolerance: Patient tolerated treatment well Patient left: in bed;with call bell/phone within reach;with bed alarm set Nurse Communication: Mobility status PT Visit Diagnosis: Other abnormalities of gait and mobility (R26.89);Muscle  weakness (generalized) (M62.81);Other symptoms and signs involving the nervous system (R29.898)     Time: 4196-2229 PT Time Calculation (min) (ACUTE ONLY): 47 min  Charges:  $Gait Training: 8-22 mins $Therapeutic Activity: 23-37 mins                     Zenaida Niece, PT, DPT Acute Rehabilitation Pager: [phone] Office [phone]    Zenaida Niece 03/07/2021, 1:16 PM

## 2021-03-07 NOTE — Progress Notes (Signed)
Placed patient on CPAP for the night at 8cm with oxygen set at 3lpm.

## 2021-03-07 NOTE — Progress Notes (Signed)
Occupational Therapy Treatment Patient Details Name: [name] MRN: [id number] DOB: [birth year] Today's Date: 03/07/2021   History of present illness 80 y.o. female admitted 03/02/2021 after being found down at home by son. CT head demonstrates R MCA stroke with R carotid artery dissection. Pt also with left 6-7 rib fx. PMH includes COPD/asthma, hypertension, rheumatoid arthritis, GERD, obstructive sleep apnea.   OT comments  Patient received in bed with family present and patient agreeable to OT session. Patient required increased time and verbal cues to get from supine to sitting on EOB.  Patient believed she could do it alone but required assistance with LLE and trunk using bed pad.  Patient required increased time sitting on EOB before attempting to stand due to gastral pain and light dizziness. Patient was able to stand from EOB and transfer to recliner with assistance of one with frequent cues for safety and walker use.  Patient was setup for grooming seated in recliner with items placed on left to address left visual field scanning and patient required mod verbal cues to locate items.  Patient is making good progress with OT treatment and could benefit from inpatient rehab to further progress.    Recommendations for follow up therapy are one component of a multi-disciplinary discharge planning process, led by the attending physician.  Recommendations may be updated based on patient status, additional functional criteria and insurance authorization.    Follow Up Recommendations  Acute inpatient rehab (3hours/day)    Assistance Recommended at Discharge Frequent or constant Supervision/Assistance  Equipment Recommendations  BSC/3in1;Other (comment)    Recommendations for Other Services      Precautions / Restrictions Precautions Precautions: Fall Precaution Comments: L inattention Restrictions Weight Bearing Restrictions: No       Mobility Bed Mobility Overal bed mobility: Needs  Assistance Bed Mobility: Supine to Sit     Supine to sit: Mod assist;HOB elevated     General bed mobility comments: patient required assistance with LLE to get to EOB and pad was used to assist with hips    Transfers Overall transfer level: Needs assistance Equipment used: Rolling walker (2 wheels) Transfers: Sit to/from Stand;Bed to chair/wheelchair/BSC Sit to Stand: Mod assist   Step pivot transfers: Mod assist       General transfer comment: mod assist of one with verbal cues throughout for safety and technique     Balance Overall balance assessment: Needs assistance Sitting-balance support: No upper extremity supported;Feet supported Sitting balance-Leahy Scale: Good Sitting balance - Comments: no assistance needed for sitting balance   Standing balance support: Bilateral upper extremity supported Standing balance-Leahy Scale: Poor Standing balance comment: reliant on walker for standing balance                           ADL either performed or assessed with clinical judgement   ADL Overall ADL's : Needs assistance/impaired     Grooming: Wash/dry face;Wash/dry hands;Oral care;Brushing hair;Sitting;Minimal assistance;Cueing for sequencing Grooming Details (indicate cue type and reason): items placed on left of table with cues to locate                               General ADL Comments: grooming performed seated in recliner with items placed  on left to address left visual scanning    Extremity/Trunk Assessment Upper Extremity Assessment LUE Deficits / Details: L UE shoulder ROM impaired (unsure of how much is  from CVA, per pt receives shots in shoulder, likely impacted by arthritis). L grip strength, bicep/tricep strength WFL. No sensation in L UE, poor proprioception, coordination LUE Sensation: decreased light touch;decreased proprioception LUE Coordination: decreased gross motor;decreased fine motor            Vision   Vision  Assessment?: Vision impaired- to be further tested in functional context;Yes Eye Alignment: Impaired (comment) Ocular Range of Motion: Restricted on the left;Impaired-to be further tested in functional context Alignment/Gaze Preference: Gaze right;Head turned Tracking/Visual Pursuits: Left eye does not track laterally;Left eye does not track medially;Right eye does not track medially;Right eye does not track laterally;Requires cues, head turns, or add eye shifts to track Saccades: Overshoots;Undershoots Visual Fields: Left visual field deficit;Impaired-to be further tested in functional context Depth Perception: Overshoots;Undershoots Additional Comments: mod cues to attend to left   Perception     Praxis      Cognition Arousal/Alertness: Awake/alert Behavior During Therapy: WFL for tasks assessed/performed Overall Cognitive Status: Impaired/Different from baseline Area of Impairment: Attention;Memory;Following commands;Safety/judgement;Problem solving;Awareness                 Orientation Level: Time Current Attention Level: Sustained Memory: Decreased short-term memory Following Commands: Follows one step commands inconsistently;Follows one step commands with increased time Safety/Judgement: Decreased awareness of safety;Decreased awareness of deficits Awareness: Intellectual Problem Solving: Slow processing;Difficulty sequencing;Requires verbal cues;Requires tactile cues General Comments: requires frequent cues to attend to left. followed directions when given slowly and increased time to follow          Exercises     Shoulder Instructions       General Comments      Pertinent Vitals/ Pain       Pain Assessment: Faces Faces Pain Scale: Hurts little more Pain Location: chest and left hand Pain Descriptors / Indicators: Aching;Sore;Tender Pain Intervention(s): Monitored during session  Home Living                                           Prior Functioning/Environment              Frequency  Min 3X/week        Progress Toward Goals  OT Goals(current goals can now be found in the care plan section)  Progress towards OT goals: Progressing toward goals  Acute Rehab OT Goals Patient Stated Goal: get up on her own OT Goal Formulation: With patient/family Time For Goal Achievement: 03/17/21 Potential to Achieve Goals: Good ADL Goals Pt Will Perform Upper Body Bathing: with min assist;sitting Pt Will Perform Lower Body Bathing: with min assist;sit to/from stand Pt Will Transfer to Toilet: with min guard assist;ambulating Pt Will Perform Toileting - Clothing Manipulation and hygiene: with min assist;sit to/from stand Additional ADL Goal #1: Pt to attend to functional tasks > 3 min with min verbal cues Additional ADL Goal #2: Pt to demo ability to scan to L visual field and identify 3/3 ADL items with min verbal cues  Plan Discharge plan remains appropriate    Co-evaluation                 AM-PAC OT "6 Clicks" Daily Activity     Outcome Measure   Help from another person eating meals?: A Little Help from another person taking care of personal grooming?: A Lot Help from another person toileting, which includes using toliet, bedpan, or urinal?:  A Lot Help from another person bathing (including washing, rinsing, drying)?: A Lot Help from another person to put on and taking off regular upper body clothing?: A Lot Help from another person to put on and taking off regular lower body clothing?: A Lot 6 Click Score: 13    End of Session Equipment Utilized During Treatment: Gait belt;Rolling walker (2 wheels);Oxygen  OT Visit Diagnosis: Unsteadiness on feet (R26.81);Other abnormalities of gait and mobility (R26.89);Muscle weakness (generalized) (M62.81);History of falling (Z91.81);Low vision, both eyes (H54.2);Apraxia (R48.2);Ataxia, unspecified (R27.0);Other symptoms and signs involving cognitive  function;Hemiplegia and hemiparesis Hemiplegia - Right/Left: Left Hemiplegia - dominant/non-dominant: Dominant Hemiplegia - caused by: Cerebral infarction   Activity Tolerance Patient tolerated treatment well   Patient Left in chair;with call bell/phone within reach;with chair alarm set;with family/visitor present   Nurse Communication Mobility status        Time: 9728-2060 OT Time Calculation (min): 33 min  Charges: OT General Charges $OT Visit: 1 Visit OT Treatments $Self Care/Home Management : 8-22 mins $Therapeutic Activity: 8-22 mins  Lodema Hong, OTA Acute Rehabilitation Services  Pager [phone] Office [place] 03/07/2021, 11:10 AM

## 2021-03-07 NOTE — Progress Notes (Signed)
Inpatient Rehab Admissions Coordinator:   I spoke to Pt.'s son regarding CIR vs SNF. MD with insurance is recommending and has already approved SNF. Son now on board with SNF. CIR will sign off.   Clemens Catholic, [place], [place] Admissions Coordinator  [phone] ([place]) [phone] (office)

## 2021-03-07 NOTE — Progress Notes (Signed)
Dressing change performed to LLE wound. Pt tolerated with no problem. VWilliams, Therapist, sports.

## 2021-03-07 NOTE — TOC Progression Note (Signed)
Transition of Care [facility]) - Progression Note    Patient Details  Name: [name] MRN: [id number] Date of Birth: [birth year]  Transition of Care [facility]) CM/SW [place], LCSW Phone Number: 03/07/2021, 3:25 PM  Clinical Narrative:    CSW spoke with patient's son. He reported agreement with [facility]. They can accept patient on Friday. Auth received # [id number], Enzo Bi [phone].    Expected Discharge Plan: Skilled Nursing Facility Barriers to Discharge: Insurance Authorization  Expected Discharge Plan and Services Expected Discharge Plan: [place] In-house Referral: Clinical Social Work Discharge Planning Services: CM Consult Post Acute Care Choice: Durable Medical Equipment Living arrangements for the past 2 months: Single Family Home                 DME Arranged: Walker rolling DME Agency: [employer] Date DME Agency Contacted: 03/05/21 Time DME Agency Contacted: [phone] Representative spoke with at DME Agency: Bronwen Betters             Social Determinants of Health ([place]) Interventions    Readmission Risk Interventions No flowsheet data found.

## 2021-03-07 NOTE — Progress Notes (Signed)
PROGRESS NOTE  [name] KDX:[id number] DOB: [birth year]   PCP: Lajean Manes, MD  Patient is from: Home  DOA: 03/02/2021 LOS: 5  Chief complaints:  Chief Complaint  Patient presents with   Fall     Brief Narrative / Interim history: 80 year old F with PMH of COPD/asthma, RA, OSA on CPAP, HTN and GERD brought to ED with left hemiparesis after found down at home, and admitted for acute embolic right MCA CVA with right carotid artery dissection.  She was down for unknown period of time, and not within tPA window on arrival.  She was also found to be in A. Fib.  TTE with LVEF of 65% and G1 DD.  She was started on Eliquis.  Therapy recommended CIR but not approved. She will be discharged to SNF  Subjective: Seen and examined earlier this morning.  No major events overnight of this morning.  No complaints other than left-sided chest wall pain from fall.  No other complaints.  Patient's son and daughter-in-law at bedside.  Objective: Vitals:   03/07/21 1020 03/07/21 1213 03/07/21 1215 03/07/21 1619  BP: 118/66 133/77 133/77 129/65  Pulse: (!) 114 (!) 104 (!) 109 (!) 108  Resp: (!) 21 20 17 15   Temp:   97.6 F (36.4 C) 97.9 F (36.6 C)  TempSrc:   Oral Oral  SpO2:   97% 97%  Weight:      Height:        Intake/Output Summary (Last 24 hours) at 03/07/2021 1646 Last data filed at 03/07/2021 1455 Gross per 24 hour  Intake 360 ml  Output 350 ml  Net 10 ml   Filed Weights   03/02/21 2301 03/06/21 0500 03/07/21 0500  Weight: 122.5 kg 121.2 kg 123.1 kg    Examination:  GENERAL: No apparent distress.  Nontoxic. HEENT: MMM.  Vision and hearing grossly intact.  NECK: Supple.  No apparent JVD.  RESP: 97% on 3 L.  No IWOB.  Fair aeration bilaterally. CVS:  RRR. Heart sounds normal.  ABD/GI/GU: BS+. Abd soft, NTND.  MSK/EXT:  Moves extremities. No apparent deformity. No edema.  SKIN: no apparent skin lesion or wound NEURO: Awake, alert and oriented appropriately.  No apparent  focal neuro deficit.  Seems to have fair strength in both arms. PSYCH: Calm. Normal affect.   Procedures:  None  Microbiology summarized: [id number] and influenza PCR nonreactive. Urine cultures NGTD.  Assessment & Plan: Acute right MCA PFX:TKWI to be embolic in the setting of right ICA dissection with right M2 occlusion Right ICA dissection-no thrombectomy due to high risk for hemorrhagic transformation -TTE with LVEF of 65% and G1 DD.  LDL 74.  A1c 5.4%. -Continue Eliquis per neurology -Continue PT/OT  COPD exacerbation/chronic hypoxic respiratory failure-stable on home 3 L. -Received IV steroid briefly.  Already on IV ceftriaxone for possible UTI. -Continue low-dose prednisone-that she takes for a period -Continue breathing treatments   Positive D dimer: CTA chest negative for PE.  LE [place] negative for DVT. -Already on Eliquis for CVA/A. fib/dissection   New onset A fib with mild RVR: TTE as above.  TSH within normal. -Consolidate Cardizem to Cardizem CD 240 mg daily -Cardizem 30 mg every 6 hours as needed for HR> 120. -Continue Eliquis for anticoagulation -Optimize K and Mg  Elevated troponin: Likely demand ischemia from A. fib.  TTE reassuring. -Manage A. fib as above   Anterior Rib Fx: Stable.  No flail chest. -Continue lidocaine patch and incentive spirometry. -Decrease Vicodin to 1  tablet every 6 hours as needed   UTI?  UA concerning but urine culture negative. -Completed course of IV ceftriaxone   Essential hypertension: Permissive hypertension normalizing 5 to 7 days -Holding Norvasc and lisinopril -Cardizem as above for A. fib.   Leukocytosis: Likely demargination from steroid.  Improved.  Rheumatoid arthritis: Stable. -Continue home Plaquenil and Arava   OSA on CPAP:  -Continue nightly CPAP  Physical deconditioning/ambulatory dysfunction -PT/OT  Morbid obesity Body mass index is 42.51 kg/m.         DVT prophylaxis:  SCD's Start: 03/02/21  1856 apixaban (ELIQUIS) tablet 5 mg  Code Status: DNR/DNI Family Communication: Updated patient's son and daughter in law at bedside. Level of care: Telemetry Medical Status is: Inpatient  Remains inpatient appropriate because: Hemodynamic stability from A. fib with RVR and safe disposition       Consultants:  Neurology   Sch Meds:  Scheduled Meds:   stroke: mapping our early stages of recovery book   Does not apply Once   apixaban  5 mg Oral BID   bacitracin   Topical BID   diltiazem  240 mg Oral Daily   fluticasone furoate-vilanterol  1 puff Inhalation Daily   And   umeclidinium bromide  1 puff Inhalation Daily   hydroxychloroquine  200 mg Oral BID   leflunomide  20 mg Oral q morning   lidocaine  1 patch Transdermal Q24H   loratadine  10 mg Oral Daily   polyethylene glycol  17 g Oral BID   predniSONE  7.5 mg Oral Q breakfast   rOPINIRole  1 mg Oral QHS   rosuvastatin  20 mg Oral Daily   Continuous Infusions: PRN Meds:.acetaminophen **OR** [DISCONTINUED] acetaminophen (TYLENOL) oral liquid 160 mg/5 mL **OR** acetaminophen, bisacodyl, diltiazem, HYDROcodone-acetaminophen, ipratropium-albuterol, ondansetron (ZOFRAN) IV, senna-docusate, sodium chloride  Antimicrobials: Anti-infectives (From admission, onward)    Start     Dose/Rate Route Frequency Ordered Stop   03/02/21 2200  hydroxychloroquine (PLAQUENIL) tablet 200 mg        200 mg Oral 2 times daily 03/02/21 1856     03/02/21 1800  cefTRIAXone (ROCEPHIN) 1 g in sodium chloride 0.9 % 100 mL IVPB        1 g 200 mL/hr over 30 Minutes Intravenous Every 24 hours 03/02/21 1745 03/06/21 1757   03/02/21 1745  cefTRIAXone (ROCEPHIN) 2 g in sodium chloride 0.9 % 100 mL IVPB  Status:  Discontinued        2 g 200 mL/hr over 30 Minutes Intravenous Every 24 hours 03/02/21 1736 03/02/21 1745        I have personally reviewed the following labs and images: CBC: Recent Labs  Lab 03/02/21 1158 03/04/21 0708 03/05/21 0125  03/06/21 0047  WBC 15.8* 18.1* 18.6* 11.9*  NEUTROABS 11.3*  --   --   --   HGB 12.4 11.2* 11.3* 10.8*  HCT 38.4 34.2* 35.4* 32.6*  MCV 101.6* 98.8 99.2 97.3  PLT 135* 126* 120* 111*   BMP &GFR Recent Labs  Lab 03/02/21 1158 03/04/21 0647 03/05/21 0125 03/06/21 0047 03/07/21 0103  NA 141 137 138 138 137  K 4.0 4.2 4.0 3.5 3.9  CL 105 104 104 105 103  CO2 23 24 27 25 24   GLUCOSE 142* 121* 103* 125* 106*  BUN 19 15 13 14 18   CREATININE 0.94 1.01* 0.81 0.83 0.87  CALCIUM 8.5* 8.4* 8.1* 7.9* 8.2*  MG  --  1.8  --   --   --  Estimated Creatinine Clearance: 69 mL/min (by C-G formula based on SCr of 0.87 mg/dL). Liver & Pancreas: Recent Labs  Lab 03/02/21 1158  AST 30  ALT 17  ALKPHOS 63  BILITOT 0.9  PROT 6.7  ALBUMIN 3.6   No results for input(s): LIPASE, AMYLASE in the last 168 hours. No results for input(s): AMMONIA in the last 168 hours. Diabetic: No results for input(s): HGBA1C in the last 72 hours. Recent Labs  Lab 03/06/21 1622 03/06/21 2241 03/07/21 0751 03/07/21 1212 03/07/21 1617  GLUCAP 129* 106* 80 106* 131*   Cardiac Enzymes: No results for input(s): CKTOTAL, CKMB, CKMBINDEX, TROPONINI in the last 168 hours. No results for input(s): PROBNP in the last 8760 hours. Coagulation Profile: Recent Labs  Lab 03/02/21 1158  INR 0.9   Thyroid Function Tests: No results for input(s): TSH, T4TOTAL, FREET4, T3FREE, THYROIDAB in the last 72 hours. Lipid Profile: No results for input(s): CHOL, HDL, LDLCALC, TRIG, CHOLHDL, LDLDIRECT in the last 72 hours. Anemia Panel: No results for input(s): VITAMINB12, FOLATE, FERRITIN, TIBC, IRON, RETICCTPCT in the last 72 hours. Urine analysis:    Component Value Date/Time   COLORURINE YELLOW 03/02/2021 1446   APPEARANCEUR CLOUDY (A) 03/02/2021 1446   LABSPEC 1.015 03/02/2021 1446   PHURINE 5.0 03/02/2021 1446   GLUCOSEU NEGATIVE 03/02/2021 1446   HGBUR SMALL (A) 03/02/2021 1446   BILIRUBINUR NEGATIVE  03/02/2021 1446   KETONESUR NEGATIVE 03/02/2021 1446   PROTEINUR 30 (A) 03/02/2021 1446   NITRITE POSITIVE (A) 03/02/2021 1446   LEUKOCYTESUR LARGE (A) 03/02/2021 1446   Sepsis Labs: Invalid input(s): PROCALCITONIN, [place]  Microbiology: Recent Results (from the past 240 hour(s))  Resp Panel by RT-PCR (Flu A&B, Covid) Nasopharyngeal Swab     Status: None   Collection Time: 03/02/21  3:30 PM   Specimen: Nasopharyngeal Swab; Nasopharyngeal(NP) swabs in vial transport medium  Result Value Ref Range Status   SARS Coronavirus 2 by RT PCR NEGATIVE NEGATIVE Final    Comment: (NOTE) SARS-CoV-2 target nucleic acids are NOT DETECTED.  The SARS-CoV-2 RNA is generally detectable in upper respiratory specimens during the acute phase of infection. The lowest concentration of SARS-CoV-2 viral copies this assay can detect is 138 copies/mL. A negative result does not preclude SARS-Cov-2 infection and should not be used as the sole basis for treatment or other patient management decisions. A negative result may occur with  improper specimen collection/handling, submission of specimen other than nasopharyngeal swab, presence of viral mutation(s) within the areas targeted by this assay, and inadequate number of viral copies(<138 copies/mL). A negative result must be combined with clinical observations, patient history, and epidemiological information. The expected result is Negative.  Fact Sheet for Patients:  EntrepreneurPulse.com.au  Fact Sheet for Healthcare Providers:  IncredibleEmployment.be  This test is no t yet approved or cleared by the [place] FDA and  has been authorized for detection and/or diagnosis of SARS-CoV-2 by FDA under an Emergency Use Authorization (EUA). This EUA will remain  in effect (meaning this test can be used) for the duration of the COVID-19 declaration under Section 564(b)(1) of the Act, 21 U.S.C.section  360bbb-3(b)(1), unless the authorization is terminated  or revoked sooner.       Influenza A by PCR NEGATIVE NEGATIVE Final   Influenza B by PCR NEGATIVE NEGATIVE Final    Comment: (NOTE) The Xpert Xpress SARS-CoV-2/FLU/RSV plus assay is intended as an aid in the diagnosis of influenza from Nasopharyngeal swab specimens and should not be used as a sole  basis for treatment. Nasal washings and aspirates are unacceptable for Xpert Xpress SARS-CoV-2/FLU/RSV testing.  Fact Sheet for Patients: EntrepreneurPulse.com.au  Fact Sheet for Healthcare Providers: IncredibleEmployment.be  This test is not yet approved or cleared by the [place] FDA and has been authorized for detection and/or diagnosis of SARS-CoV-2 by FDA under an Emergency Use Authorization (EUA). This EUA will remain in effect (meaning this test can be used) for the duration of the COVID-19 declaration under Section 564(b)(1) of the Act, 21 U.S.C. section 360bbb-3(b)(1), unless the authorization is terminated or revoked.  Performed at [facility], [address], [place], [place] [address]   Urine Culture     Status: None   Collection Time: 03/04/21  1:52 PM   Specimen: Urine, Clean Catch  Result Value Ref Range Status   Specimen Description URINE, CLEAN CATCH  Final   Special Requests NONE  Final   Culture   Final    NO GROWTH Performed at [facility], [address] [place], [place] [address]    Report Status 03/05/2021 FINAL  Final    Radiology Studies: No results found.    Zela Sobieski T. [place]  If 7PM-7AM, please contact night-coverage [email or web address] 03/07/2021, 4:46 PM

## 2021-03-07 NOTE — Progress Notes (Signed)
Speech Language Pathology Treatment: Dysphagia;Cognitive-Linquistic  Patient Details Name: [name] MRN: [id number] DOB: 06-17-1940 Today's Date: 03/07/2021 Time: 5053-9767 SLP Time Calculation (min) (ACUTE ONLY): 25 min  Assessment / Plan / Recommendation Clinical Impression  Pt was seen for treatment with her family present. Pt and her family reported that the pt has been tolerating the current diet without overt s/sx of aspiration. They agreed that her cognitive-linguistic skills are improved, but not back to baseline. Pt tolerated regular texture solids, dual consistency boluses (i.e., pineapple with thin liquids), and thin via straw without overt s/sx of aspiration. Mastication was [facility] and oral clearance adequate. Considering pt's progress since the initial evaluation, the [facility] Mental Status Examination was completed to re-evaluate the pt's cognitive-linguistic skills. She achieved a score of 17/30 which is below the normal limits of 27 or more out of 30. She exhibited difficulty in the areas of awareness, memory, complex problem solving, orientation, and executive function. SLP will continue to follow pt, but for cognition only.     HPI HPI: 80 y.o. female presents to [facility] hospital on 03/02/2021 after being found down at home by son. Pt reports being unable to reach walker and experiencing a fall. CT head demonstrates R MCA stroke with R carotid artery dissection. PMH includes COPD/asthma, hypertension, rheumatoid arthritis, GERD, obstructive sleep apnea. She has been NPO awaiting BSE.      SLP Plan  Continue with current plan of care      Recommendations for follow up therapy are one component of a multi-disciplinary discharge planning process, led by the attending physician.  Recommendations may be updated based on patient status, additional functional criteria and insurance authorization.    Recommendations  Diet recommendations: Regular;Thin liquid Liquids  provided via: Cup;Straw Medication Administration: Whole meds with liquid Supervision: Staff to assist with self feeding Compensations: Minimize environmental distractions Postural Changes and/or Swallow Maneuvers: Seated upright 90 degrees                Oral Care Recommendations: Oral care BID;Staff/trained caregiver to provide oral care Follow Up Recommendations: Acute inpatient rehab (3hours/day) Assistance recommended at discharge: Frequent or constant Supervision/Assistance SLP Visit Diagnosis: Dysphagia, unspecified (R13.10) Plan: Continue with current plan of care       Shiro Ellerman I. Hardin Negus, [place], [place] Office number [phone] Pager [place]  03/07/2021, 12:53 PM

## 2021-03-08 ENCOUNTER — Other Ambulatory Visit: Payer: Self-pay | Admitting: Adult Health

## 2021-03-08 DIAGNOSIS — M0579 Rheumatoid arthritis with rheumatoid factor of multiple sites without organ or systems involvement: Secondary | ICD-10-CM

## 2021-03-08 LAB — RENAL FUNCTION PANEL
Albumin: 2.7 g/dL — ABNORMAL LOW (ref 3.5–5.0)
Anion gap: 10 (ref 5–15)
BUN: 15 mg/dL (ref 8–23)
CO2: 22 mmol/L (ref 22–32)
Calcium: 8 mg/dL — ABNORMAL LOW (ref 8.9–10.3)
Chloride: 104 mmol/L (ref 98–111)
Creatinine, Ser: 0.8 mg/dL (ref 0.44–1.00)
GFR, Estimated: >60 mL/min (ref >60)
Glucose, Bld: 84 mg/dL (ref 70–99)
Phosphorus: 2.9 mg/dL (ref 2.5–4.6)
Potassium: 3.8 mmol/L (ref 3.5–5.1)
Sodium: 136 mmol/L (ref 135–145)

## 2021-03-08 LAB — MAGNESIUM: Magnesium: 1.9 mg/dL (ref 1.7–2.4)

## 2021-03-08 LAB — CBC
HCT: 35.8 % — ABNORMAL LOW (ref 36.0–46.0)
Hemoglobin: 11.6 g/dL — ABNORMAL LOW (ref 12.0–15.0)
MCH: 31.8 pg (ref 26.0–34.0)
MCHC: 32.4 g/dL (ref 30.0–36.0)
MCV: 98.1 fL (ref 80.0–100.0)
Platelets: 157 10*3/uL (ref 150–400)
RBC: 3.65 MIL/uL — ABNORMAL LOW (ref 3.87–5.11)
RDW: 13.2 % (ref 11.5–15.5)
WBC: 16 10*3/uL — ABNORMAL HIGH (ref 4.0–10.5)
nRBC: 0 % (ref 0.0–0.2)

## 2021-03-08 LAB — GLUCOSE, CAPILLARY
Glucose-Capillary: 80 mg/dL (ref 70–99)
Glucose-Capillary: 113 mg/dL — ABNORMAL HIGH (ref 70–99)
Glucose-Capillary: 133 mg/dL — ABNORMAL HIGH (ref 70–99)
Glucose-Capillary: 81 mg/dL (ref 70–99)

## 2021-03-08 MED ORDER — DILTIAZEM HCL ER COATED BEADS 180 MG PO CP24
300.0000 mg | ORAL_CAPSULE | Freq: Every day | ORAL | Status: DC
  Administered 2021-03-08: 300 mg via ORAL
  Filled 2021-03-08 (×2): qty 1

## 2021-03-08 NOTE — Progress Notes (Signed)
   03/08/21 1651  Assess: MEWS Score  BP (!) 145/76  Level of Consciousness Alert  SpO2 94 %  O2 Device Nasal Cannula  O2 Flow Rate (L/min) 3 L/min  Assess: MEWS Score  MEWS Temp 0  MEWS Systolic 0  MEWS Pulse 2  MEWS RR 0  MEWS LOC 0  MEWS Score 2  MEWS Score Color Yellow  Assess: if the MEWS score is Yellow or Red  Were vital signs taken at a resting state? Yes  Focused Assessment Change from prior assessment (see assessment flowsheet)  Early Detection of Sepsis Score *See Row Information* Medium  MEWS guidelines implemented *See Row Information* Yes  Treat  MEWS Interventions Administered prn meds/treatments;Escalated (See documentation below)  Take Vital Signs  Increase Vital Sign Frequency  Yellow: Q 2hr X 2 then Q 4hr X 2, if remains yellow, continue Q 4hrs  Escalate  MEWS: Escalate Yellow: discuss with charge nurse/RN and consider discussing with provider and RRT  Notify: Charge Nurse/RN  Name of Charge Nurse/RN Notified Erin, RN  Date Charge Nurse/RN Notified 03/08/21  Time Charge Nurse/RN Notified 0177  Notify: Provider  Provider Name/Title Dr. Cyndia Skeeters  Date Provider Notified 03/08/21  Time Provider Notified 1651  Notification Type Page  Notification Reason Change in status  Provider response Other (Comment) (agreed with nursing to administer prn Cardizem)  Date of Provider Response 03/08/21  Time of Provider Response 1656  Document  Progress note created (see row info) Yes   Pt received prn Cardizem for tachycardia with good effects.  MD notified.

## 2021-03-08 NOTE — Progress Notes (Signed)
   03/07/21 2320  Assess: MEWS Score  Temp 99.1 F (37.3 C)  BP 108/82  Pulse Rate (!) 116  ECG Heart Rate (!) 113  Resp (!) 25  Level of Consciousness Alert  SpO2 99 %  O2 Device Nasal Cannula  O2 Flow Rate (L/min) 3 L/min  Assess: MEWS Score  MEWS Temp 0  MEWS Systolic 0  MEWS Pulse 2  MEWS RR 1  MEWS LOC 0  MEWS Score 3  MEWS Score Color Yellow  Assess: if the MEWS score is Yellow or Red  Were vital signs taken at a resting state? Yes  Focused Assessment No change from prior assessment  Early Detection of Sepsis Score *See Row Information* Low  MEWS guidelines implemented *See Row Information* Yes  Take Vital Signs  Increase Vital Sign Frequency  Yellow: Q 2hr X 2 then Q 4hr X 2, if remains yellow, continue Q 4hrs  Escalate  MEWS: Escalate Yellow: discuss with charge nurse/RN and consider discussing with provider and RRT  Notify: Charge Nurse/RN  Name of Charge Nurse/RN Notified Nikki RN  Date Charge Nurse/RN Notified 03/07/21  Time Charge Nurse/RN Notified 2320  Document  Patient Outcome Other (Comment) (Patient intermittently restless; heart rate increased with movement)  Progress note created (see row info) Yes

## 2021-03-08 NOTE — Progress Notes (Signed)
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  She was down for unknown period of time, and not within tPA window on arrival.  She was also found to be in A. Fib.  TTE with LVEF of 65% and G1 DD.  She was started on Eliquis.  Therapy recommended CIR but not approved. She will be discharged to SNF  Subjective: Seen and examined earlier this morning.  Patient's son at bedside.  She was a little confused and agitated earlier in the morning.  No other concerns.  She has no complaints.  She denies chest pain, shortness of breath, GI or UTI symptoms.  Objective: Vitals:   03/08/21 0848 03/08/21 0900 03/08/21 1025 03/08/21 1200  BP:   133/64 (!) 164/84  Pulse:  (!) 120 100 97  Resp:   18 (!) 21  Temp:   98.4 F (36.9 C) 98.2 F (36.8 C)  TempSrc:   Oral Oral  SpO2: 91% (!) 89% 90% 97%  Weight:      Height:        Intake/Output Summary (Last 24 hours) at 03/08/2021 1335 Last data filed at 03/07/2021 2300 Gross per 24 hour  Intake 170 ml  Output 600 ml  Net -430 ml   Filed Weights   03/06/21 0500 03/07/21 0500 03/08/21 0452  Weight: 121.2 kg 123.1 kg 120.2 kg    Examination:  GENERAL: No apparent distress.  Nontoxic. HEENT: MMM.  Vision and hearing grossly intact.  NECK: Supple.  No apparent JVD.  RESP: 97% on 3 L.  No IWOB.  Fair aeration bilaterally. CVS: IR at 100-110. Heart sounds normal.  ABD/GI/GU: BS+. Abd soft, NTND.  MSK/EXT:  Moves extremities. No apparent deformity. No edema.  SKIN: Laceration wound over LLE. NEURO: Awake and alert. Oriented fairly.  No  apparent focal neuro deficit. PSYCH: Calm. Normal affect.   Procedures:  None  Microbiology summarized: [id number] and influenza PCR nonreactive. Urine cultures NGTD.  Assessment & Plan: Acute right MCA DDU:KGUR to be embolic in the setting of right ICA dissection with right M2 occlusion Right ICA dissection-no thrombectomy due to high risk for hemorrhagic transformation -TTE with LVEF of 65% and G1 DD.  LDL 74.  A1c 5.4%. -Continue Eliquis per neurology -Continue PT/OT  COPD exacerbation/chronic hypoxic respiratory failure-stable on home 3 L. -Received IV steroid briefly.  Already on IV ceftriaxone for possible UTI. -Continue low-dose prednisone-that she takes for RA. -Continue breathing treatments   Positive D dimer: CTA chest negative for PE.  LE [place] negative for DVT. -Already on Eliquis for CVA/A. fib/dissection   New onset A fib with mild RVR: TTE as above.  TSH within normal. -Increase Cardizem CD from 240 mg daily to 300 mg daily. -Cardizem 30 mg every 6 hours as needed for HR> 120. -Continue Eliquis for anticoagulation -Optimize K and Mg  Elevated troponin: Likely demand ischemia from A. fib.  TTE reassuring. -Manage A. fib as above   Anterior Rib Fx: Stable.  No flail chest. LLE laceration/skin tear -Continue lidocaine patch and incentive spirometry. -Decreased Vicodin  to 1 tablet every 6 hours as needed   UTI?  UA concerning but urine culture negative. -Completed course of IV ceftriaxone   Essential hypertension: BP slightly elevated this morning -Holding Norvasc and lisinopril -Increased Cardizem as above.   Leukocytosis: Likely demargination from steroid  Rheumatoid arthritis: Stable. -Continue home Plaquenil and Arava   OSA on CPAP:  -Continue nightly CPAP  Physical deconditioning/ambulatory dysfunction -PT/OT  Morbid obesity Body mass index is 41.5 kg/m.         DVT prophylaxis:  SCD's Start: 03/02/21 1856 apixaban (ELIQUIS) tablet 5 mg   Code Status: DNR/DNI Family Communication: Updated patient's son and daughter in law at bedside. Level of care: Telemetry Medical Status is: Inpatient  Remains inpatient appropriate because: Hemodynamic stability from A. fib with RVR and safe disposition       Consultants:  Neurology-signed off   Sch Meds:  Scheduled Meds:   stroke: mapping our early stages of recovery book   Does not apply Once   apixaban  5 mg Oral BID   bacitracin   Topical BID   diltiazem  300 mg Oral Daily   fluticasone furoate-vilanterol  1 puff Inhalation Daily   And   umeclidinium bromide  1 puff Inhalation Daily   hydroxychloroquine  200 mg Oral BID   leflunomide  20 mg Oral q morning   lidocaine  1 patch Transdermal Q24H   loratadine  10 mg Oral Daily   polyethylene glycol  17 g Oral BID   predniSONE  7.5 mg Oral Q breakfast   rOPINIRole  1 mg Oral QHS   rosuvastatin  20 mg Oral Daily   Continuous Infusions: PRN Meds:.acetaminophen **OR** [DISCONTINUED] acetaminophen (TYLENOL) oral liquid 160 mg/5 mL **OR** acetaminophen, bisacodyl, diltiazem, HYDROcodone-acetaminophen, ipratropium-albuterol, ondansetron (ZOFRAN) IV, senna-docusate, sodium chloride  Antimicrobials: Anti-infectives (From admission, onward)    Start     Dose/Rate Route Frequency Ordered Stop   03/02/21 2200  hydroxychloroquine (PLAQUENIL) tablet 200 mg        200 mg Oral 2 times daily 03/02/21 1856     03/02/21 1800  cefTRIAXone (ROCEPHIN) 1 g in sodium chloride 0.9 % 100 mL IVPB        1 g 200 mL/hr over 30 Minutes Intravenous Every 24 hours 03/02/21 1745 03/06/21 1757   03/02/21 1745  cefTRIAXone (ROCEPHIN) 2 g in sodium chloride 0.9 % 100 mL IVPB  Status:  Discontinued        2 g 200 mL/hr over 30 Minutes Intravenous Every 24 hours 03/02/21 1736 03/02/21 1745        I have personally reviewed the following labs and images: CBC: Recent Labs  Lab 03/02/21 1158 03/04/21 0708 03/05/21 0125 03/06/21 0047  03/08/21 0147  WBC 15.8* 18.1* 18.6* 11.9* 16.0*  NEUTROABS 11.3*  --   --   --   --   HGB 12.4 11.2* 11.3* 10.8* 11.6*  HCT 38.4 34.2* 35.4* 32.6* 35.8*  MCV 101.6* 98.8 99.2 97.3 98.1  PLT 135* 126* 120* 111* 157   BMP &GFR Recent Labs  Lab 03/04/21 0647 03/05/21 0125 03/06/21 0047 03/07/21 0103 03/08/21 0147  NA 137 138 138 137 136  K 4.2 4.0 3.5 3.9 3.8  CL 104 104 105 103 104  CO2 24 27 25 24 22   GLUCOSE 121* 103* 125* 106* 84  BUN 15 13 14 18 15   CREATININE 1.01* 0.81 0.83 0.87 0.80  CALCIUM 8.4* 8.1* 7.9* 8.2* 8.0*  MG 1.8  --   --   --  1.9  PHOS  --   --   --   --  2.9   Estimated Creatinine Clearance: 74 mL/min (by C-G formula based on SCr of 0.8 mg/dL). Liver & Pancreas: Recent Labs  Lab 03/02/21 1158 03/08/21 0147  AST 30  --   ALT 17  --   ALKPHOS 63  --   BILITOT 0.9  --   PROT 6.7  --   ALBUMIN 3.6 2.7*   No results for input(s): LIPASE, AMYLASE in the last 168 hours. No results for input(s): AMMONIA in the last 168 hours. Diabetic: No results for input(s): HGBA1C in the last 72 hours. Recent Labs  Lab 03/07/21 1212 03/07/21 1617 03/07/21 2034 03/08/21 0730 03/08/21 1202  GLUCAP 106* 131* 118* 80 113*   Cardiac Enzymes: No results for input(s): CKTOTAL, CKMB, CKMBINDEX, TROPONINI in the last 168 hours. No results for input(s): PROBNP in the last 8760 hours. Coagulation Profile: Recent Labs  Lab 03/02/21 1158  INR 0.9   Thyroid Function Tests: No results for input(s): TSH, T4TOTAL, FREET4, T3FREE, THYROIDAB in the last 72 hours. Lipid Profile: No results for input(s): CHOL, HDL, LDLCALC, TRIG, CHOLHDL, LDLDIRECT in the last 72 hours. Anemia Panel: No results for input(s): VITAMINB12, FOLATE, FERRITIN, TIBC, IRON, RETICCTPCT in the last 72 hours. Urine analysis:    Component Value Date/Time   COLORURINE YELLOW 03/02/2021 1446   APPEARANCEUR CLOUDY (A) 03/02/2021 1446   LABSPEC 1.015 03/02/2021 1446   PHURINE 5.0 03/02/2021 1446    GLUCOSEU NEGATIVE 03/02/2021 1446   HGBUR SMALL (A) 03/02/2021 1446   BILIRUBINUR NEGATIVE 03/02/2021 1446   KETONESUR NEGATIVE 03/02/2021 1446   PROTEINUR 30 (A) 03/02/2021 1446   NITRITE POSITIVE (A) 03/02/2021 1446   LEUKOCYTESUR LARGE (A) 03/02/2021 1446   Sepsis Labs: Invalid input(s): PROCALCITONIN, [place]  Microbiology: Recent Results (from the past 240 hour(s))  Resp Panel by RT-PCR (Flu A&B, Covid) Nasopharyngeal Swab     Status: None   Collection Time: 03/02/21  3:30 PM   Specimen: Nasopharyngeal Swab; Nasopharyngeal(NP) swabs in vial transport medium  Result Value Ref Range Status   SARS Coronavirus 2 by RT PCR NEGATIVE NEGATIVE Final    Comment: (NOTE) SARS-CoV-2 target nucleic acids are NOT DETECTED.  The SARS-CoV-2 RNA is generally detectable in upper respiratory specimens during the acute phase of infection. The lowest concentration of SARS-CoV-2 viral copies this assay can detect is 138 copies/mL. A negative result does not preclude SARS-Cov-2 infection and should not be used as the sole basis for treatment or other patient management decisions. A negative result may occur with  improper specimen collection/handling, submission of specimen other than nasopharyngeal swab, presence of viral mutation(s) within the areas targeted by this assay, and inadequate number of viral copies(<138 copies/mL). A negative result must be combined with clinical observations, patient history, and epidemiological information. The expected result is Negative.  Fact Sheet for Patients:  EntrepreneurPulse.com.au  Fact Sheet for Healthcare Providers:  IncredibleEmployment.be  This test is no t yet approved or cleared by the [place] FDA and  has been authorized for detection and/or diagnosis of SARS-CoV-2 by FDA under an Emergency Use Authorization (EUA). This EUA will remain  in effect (meaning this test can be used) for the duration  of the COVID-19 declaration under Section 564(b)(1) of the Act, 21 U.S.C.section 360bbb-3(b)(1), unless the authorization is terminated  or revoked sooner.       Influenza A by PCR NEGATIVE NEGATIVE Final   Influenza B by PCR  NEGATIVE NEGATIVE Final    Comment: (NOTE) The Xpert Xpress SARS-CoV-2/FLU/RSV plus assay is intended as an aid in the diagnosis of influenza from Nasopharyngeal swab specimens and should not be used as a sole basis for treatment. Nasal washings and aspirates are unacceptable for Xpert Xpress SARS-CoV-2/FLU/RSV testing.  Fact Sheet for Patients: EntrepreneurPulse.com.au  Fact Sheet for Healthcare Providers: IncredibleEmployment.be  This test is not yet approved or cleared by the [place] FDA and has been authorized for detection and/or diagnosis of SARS-CoV-2 by FDA under an Emergency Use Authorization (EUA). This EUA will remain in effect (meaning this test can be used) for the duration of the COVID-19 declaration under Section 564(b)(1) of the Act, 21 U.S.C. section 360bbb-3(b)(1), unless the authorization is terminated or revoked.  Performed at [facility], [address] [place], [place] [address]   Urine Culture     Status: None   Collection Time: 03/04/21  1:52 PM   Specimen: Urine, Clean Catch  Result Value Ref Range Status   Specimen Description URINE, CLEAN CATCH  Final   Special Requests NONE  Final   Culture   Final    NO GROWTH Performed at [facility], [address], [place], [place] [address]    Report Status 03/05/2021 FINAL  Final    Radiology Studies: No results found.    Nakshatra Klose T. [place]  If 7PM-7AM, please contact night-coverage [email or web address] 03/08/2021, 1:35 PM

## 2021-03-09 DIAGNOSIS — R6520 Severe sepsis without septic shock: Secondary | ICD-10-CM | POA: Diagnosis not present

## 2021-03-09 DIAGNOSIS — J9600 Acute respiratory failure, unspecified whether with hypoxia or hypercapnia: Secondary | ICD-10-CM | POA: Diagnosis not present

## 2021-03-09 DIAGNOSIS — I69354 Hemiplegia and hemiparesis following cerebral infarction affecting left non-dominant side: Secondary | ICD-10-CM | POA: Diagnosis not present

## 2021-03-09 DIAGNOSIS — J189 Pneumonia, unspecified organism: Secondary | ICD-10-CM | POA: Diagnosis not present

## 2021-03-09 DIAGNOSIS — I69318 Other symptoms and signs involving cognitive functions following cerebral infarction: Secondary | ICD-10-CM | POA: Diagnosis not present

## 2021-03-09 DIAGNOSIS — S2242XD Multiple fractures of ribs, left side, subsequent encounter for fracture with routine healing: Secondary | ICD-10-CM | POA: Diagnosis not present

## 2021-03-09 DIAGNOSIS — J849 Interstitial pulmonary disease, unspecified: Secondary | ICD-10-CM | POA: Diagnosis not present

## 2021-03-09 DIAGNOSIS — I69391 Dysphagia following cerebral infarction: Secondary | ICD-10-CM | POA: Diagnosis not present

## 2021-03-09 DIAGNOSIS — R262 Difficulty in walking, not elsewhere classified: Secondary | ICD-10-CM | POA: Diagnosis not present

## 2021-03-09 DIAGNOSIS — Z9981 Dependence on supplemental oxygen: Secondary | ICD-10-CM | POA: Diagnosis not present

## 2021-03-09 DIAGNOSIS — S2242XA Multiple fractures of ribs, left side, initial encounter for closed fracture: Secondary | ICD-10-CM | POA: Diagnosis not present

## 2021-03-09 DIAGNOSIS — I480 Paroxysmal atrial fibrillation: Secondary | ICD-10-CM

## 2021-03-09 DIAGNOSIS — I63511 Cerebral infarction due to unspecified occlusion or stenosis of right middle cerebral artery: Secondary | ICD-10-CM | POA: Diagnosis not present

## 2021-03-09 DIAGNOSIS — J9690 Respiratory failure, unspecified, unspecified whether with hypoxia or hypercapnia: Secondary | ICD-10-CM | POA: Diagnosis not present

## 2021-03-09 DIAGNOSIS — E559 Vitamin D deficiency, unspecified: Secondary | ICD-10-CM | POA: Diagnosis not present

## 2021-03-09 DIAGNOSIS — J80 Acute respiratory distress syndrome: Secondary | ICD-10-CM | POA: Diagnosis not present

## 2021-03-09 DIAGNOSIS — I959 Hypotension, unspecified: Secondary | ICD-10-CM | POA: Diagnosis not present

## 2021-03-09 DIAGNOSIS — Z7951 Long term (current) use of inhaled steroids: Secondary | ICD-10-CM | POA: Diagnosis not present

## 2021-03-09 DIAGNOSIS — J9601 Acute respiratory failure with hypoxia: Secondary | ICD-10-CM | POA: Diagnosis not present

## 2021-03-09 DIAGNOSIS — R278 Other lack of coordination: Secondary | ICD-10-CM | POA: Diagnosis not present

## 2021-03-09 DIAGNOSIS — Z515 Encounter for palliative care: Secondary | ICD-10-CM | POA: Diagnosis not present

## 2021-03-09 DIAGNOSIS — J9611 Chronic respiratory failure with hypoxia: Secondary | ICD-10-CM | POA: Diagnosis not present

## 2021-03-09 DIAGNOSIS — Z66 Do not resuscitate: Secondary | ICD-10-CM | POA: Diagnosis not present

## 2021-03-09 DIAGNOSIS — R0902 Hypoxemia: Secondary | ICD-10-CM | POA: Diagnosis not present

## 2021-03-09 DIAGNOSIS — I7771 Dissection of carotid artery: Secondary | ICD-10-CM | POA: Diagnosis not present

## 2021-03-09 DIAGNOSIS — G4733 Obstructive sleep apnea (adult) (pediatric): Secondary | ICD-10-CM | POA: Diagnosis not present

## 2021-03-09 DIAGNOSIS — R778 Other specified abnormalities of plasma proteins: Secondary | ICD-10-CM | POA: Diagnosis not present

## 2021-03-09 DIAGNOSIS — F419 Anxiety disorder, unspecified: Secondary | ICD-10-CM

## 2021-03-09 DIAGNOSIS — G2581 Restless legs syndrome: Secondary | ICD-10-CM | POA: Diagnosis not present

## 2021-03-09 DIAGNOSIS — Z7401 Bed confinement status: Secondary | ICD-10-CM | POA: Diagnosis not present

## 2021-03-09 DIAGNOSIS — M6281 Muscle weakness (generalized): Secondary | ICD-10-CM | POA: Diagnosis not present

## 2021-03-09 DIAGNOSIS — E119 Type 2 diabetes mellitus without complications: Secondary | ICD-10-CM | POA: Diagnosis not present

## 2021-03-09 DIAGNOSIS — J440 Chronic obstructive pulmonary disease with acute lower respiratory infection: Secondary | ICD-10-CM | POA: Diagnosis not present

## 2021-03-09 DIAGNOSIS — S2249XD Multiple fractures of ribs, unspecified side, subsequent encounter for fracture with routine healing: Secondary | ICD-10-CM | POA: Diagnosis not present

## 2021-03-09 DIAGNOSIS — I4891 Unspecified atrial fibrillation: Secondary | ICD-10-CM | POA: Diagnosis not present

## 2021-03-09 DIAGNOSIS — I10 Essential (primary) hypertension: Secondary | ICD-10-CM | POA: Diagnosis not present

## 2021-03-09 DIAGNOSIS — Z792 Long term (current) use of antibiotics: Secondary | ICD-10-CM | POA: Diagnosis not present

## 2021-03-09 DIAGNOSIS — I639 Cerebral infarction, unspecified: Secondary | ICD-10-CM | POA: Diagnosis not present

## 2021-03-09 DIAGNOSIS — R5381 Other malaise: Secondary | ICD-10-CM | POA: Diagnosis not present

## 2021-03-09 DIAGNOSIS — R0689 Other abnormalities of breathing: Secondary | ICD-10-CM | POA: Diagnosis not present

## 2021-03-09 DIAGNOSIS — Z7952 Long term (current) use of systemic steroids: Secondary | ICD-10-CM | POA: Diagnosis not present

## 2021-03-09 DIAGNOSIS — J90 Pleural effusion, not elsewhere classified: Secondary | ICD-10-CM | POA: Diagnosis not present

## 2021-03-09 DIAGNOSIS — J449 Chronic obstructive pulmonary disease, unspecified: Secondary | ICD-10-CM | POA: Diagnosis not present

## 2021-03-09 DIAGNOSIS — K219 Gastro-esophageal reflux disease without esophagitis: Secondary | ICD-10-CM | POA: Diagnosis not present

## 2021-03-09 DIAGNOSIS — M069 Rheumatoid arthritis, unspecified: Secondary | ICD-10-CM | POA: Diagnosis not present

## 2021-03-09 DIAGNOSIS — I4821 Permanent atrial fibrillation: Secondary | ICD-10-CM | POA: Diagnosis not present

## 2021-03-09 DIAGNOSIS — R000 Tachycardia, unspecified: Secondary | ICD-10-CM | POA: Diagnosis not present

## 2021-03-09 DIAGNOSIS — N390 Urinary tract infection, site not specified: Secondary | ICD-10-CM | POA: Diagnosis not present

## 2021-03-09 DIAGNOSIS — R0602 Shortness of breath: Secondary | ICD-10-CM | POA: Diagnosis not present

## 2021-03-09 DIAGNOSIS — M0579 Rheumatoid arthritis with rheumatoid factor of multiple sites without organ or systems involvement: Secondary | ICD-10-CM | POA: Diagnosis not present

## 2021-03-09 DIAGNOSIS — Z20822 Contact with and (suspected) exposure to covid-19: Secondary | ICD-10-CM | POA: Diagnosis not present

## 2021-03-09 DIAGNOSIS — E8809 Other disorders of plasma-protein metabolism, not elsewhere classified: Secondary | ICD-10-CM | POA: Diagnosis not present

## 2021-03-09 DIAGNOSIS — Z7901 Long term (current) use of anticoagulants: Secondary | ICD-10-CM | POA: Diagnosis not present

## 2021-03-09 DIAGNOSIS — Z6841 Body Mass Index (BMI) 40.0 and over, adult: Secondary | ICD-10-CM | POA: Diagnosis not present

## 2021-03-09 DIAGNOSIS — Z9989 Dependence on other enabling machines and devices: Secondary | ICD-10-CM | POA: Diagnosis not present

## 2021-03-09 DIAGNOSIS — A419 Sepsis, unspecified organism: Secondary | ICD-10-CM | POA: Diagnosis not present

## 2021-03-09 LAB — CBC
HCT: 33.8 % — ABNORMAL LOW (ref 36.0–46.0)
Hemoglobin: 11.3 g/dL — ABNORMAL LOW (ref 12.0–15.0)
MCH: 32.5 pg (ref 26.0–34.0)
MCHC: 33.4 g/dL (ref 30.0–36.0)
MCV: 97.1 fL (ref 80.0–100.0)
Platelets: 153 10*3/uL (ref 150–400)
RBC: 3.48 MIL/uL — ABNORMAL LOW (ref 3.87–5.11)
RDW: 13.3 % (ref 11.5–15.5)
WBC: 18.3 10*3/uL — ABNORMAL HIGH (ref 4.0–10.5)
nRBC: 0 % (ref 0.0–0.2)

## 2021-03-09 LAB — MAGNESIUM: Magnesium: 1.9 mg/dL (ref 1.7–2.4)

## 2021-03-09 LAB — GLUCOSE, CAPILLARY: Glucose-Capillary: 90 mg/dL (ref 70–99)

## 2021-03-09 LAB — RENAL FUNCTION PANEL
Albumin: 2.7 g/dL — ABNORMAL LOW (ref 3.5–5.0)
Anion gap: 11 (ref 5–15)
BUN: 12 mg/dL (ref 8–23)
CO2: 23 mmol/L (ref 22–32)
Calcium: 8.2 mg/dL — ABNORMAL LOW (ref 8.9–10.3)
Chloride: 104 mmol/L (ref 98–111)
Creatinine, Ser: 0.74 mg/dL (ref 0.44–1.00)
GFR, Estimated: >60 mL/min (ref >60)
Glucose, Bld: 90 mg/dL (ref 70–99)
Phosphorus: 3.5 mg/dL (ref 2.5–4.6)
Potassium: 3.6 mmol/L (ref 3.5–5.1)
Sodium: 138 mmol/L (ref 135–145)

## 2021-03-09 MED ORDER — SENNOSIDES-DOCUSATE SODIUM 8.6-50 MG PO TABS: 1.0000 | ORAL_TABLET | Freq: Two times a day (BID) | ORAL | 0 refills | Status: AC | PRN

## 2021-03-09 MED ORDER — DILTIAZEM HCL 30 MG PO TABS: 30.0000 mg | ORAL_TABLET | Freq: Four times a day (QID) | ORAL | Status: AC | PRN

## 2021-03-09 MED ORDER — DILTIAZEM HCL ER COATED BEADS 360 MG PO CP24: 360.0000 mg | ORAL_CAPSULE | Freq: Every day | ORAL | Status: AC

## 2021-03-09 MED ORDER — BUSPIRONE HCL 5 MG PO TABS
5.0000 mg | ORAL_TABLET | Freq: Three times a day (TID) | ORAL | Status: AC
Start: 2021-03-09 — End: ?

## 2021-03-09 MED ORDER — APIXABAN 5 MG PO TABS: 5.0000 mg | ORAL_TABLET | Freq: Two times a day (BID) | ORAL | Status: AC

## 2021-03-09 MED ORDER — ROPINIROLE HCL 0.25 MG PO TABS: 0.5000 mg | ORAL_TABLET | Freq: Every day | ORAL | 0 refills | Status: AC

## 2021-03-09 MED ORDER — TRAMADOL HCL 50 MG PO TABS: 50.0000 mg | ORAL_TABLET | Freq: Three times a day (TID) | ORAL | 0 refills | Status: AC | PRN

## 2021-03-09 MED ORDER — DILTIAZEM HCL ER COATED BEADS 180 MG PO CP24
360.0000 mg | ORAL_CAPSULE | Freq: Every day | ORAL | Status: DC
  Administered 2021-03-09: 360 mg via ORAL
  Filled 2021-03-09: qty 2

## 2021-03-09 MED ORDER — OXYCODONE HCL 5 MG PO TABS: 5.0000 mg | ORAL_TABLET | Freq: Three times a day (TID) | ORAL | 0 refills | Status: AC | PRN

## 2021-03-09 MED ORDER — IPRATROPIUM-ALBUTEROL 0.5-2.5 (3) MG/3ML IN SOLN: 3.0000 mL | Freq: Four times a day (QID) | RESPIRATORY_TRACT | Status: AC | PRN

## 2021-03-09 MED ORDER — CLONAZEPAM 0.25 MG PO TBDP: 0.2500 mg | ORAL_TABLET | Freq: Two times a day (BID) | ORAL | Status: DC | PRN

## 2021-03-09 MED ORDER — LISINOPRIL 5 MG PO TABS
5.0000 mg | ORAL_TABLET | Freq: Every day | ORAL | Status: DC
  Administered 2021-03-09: 5 mg via ORAL
  Filled 2021-03-09: qty 1

## 2021-03-09 MED ORDER — BUSPIRONE HCL 5 MG PO TABS
5.0000 mg | ORAL_TABLET | Freq: Three times a day (TID) | ORAL | Status: DC
  Administered 2021-03-09: 5 mg via ORAL
  Filled 2021-03-09: qty 1

## 2021-03-09 MED ORDER — CLONAZEPAM 0.5 MG PO TABS: 0.2500 mg | ORAL_TABLET | Freq: Two times a day (BID) | ORAL | 0 refills | Status: AC | PRN

## 2021-03-09 MED ORDER — ACETAMINOPHEN 500 MG PO TABS: 1000.0000 mg | ORAL_TABLET | Freq: Three times a day (TID) | ORAL | 0 refills | Status: AC | PRN

## 2021-03-09 NOTE — Plan of Care (Signed)
  Problem: Education: Goal: Knowledge of General Education information will improve Description: Including pain rating scale, medication(s)/side effects and non-pharmacologic comfort measures Outcome: Progressing   Problem: Health Behavior/Discharge Planning: Goal: Ability to manage health-related needs will improve Outcome: Progressing   Problem: Clinical Measurements: Goal: Ability to maintain clinical measurements within normal limits will improve Outcome: Progressing Goal: Will remain free from infection Outcome: Progressing Goal: Diagnostic test results will improve Outcome: Progressing Goal: Respiratory complications will improve Outcome: Progressing Goal: Cardiovascular complication will be avoided Outcome: Progressing   Problem: Activity: Goal: Risk for activity intolerance will decrease Outcome: Progressing   Problem: Nutrition: Goal: Adequate nutrition will be maintained Outcome: Progressing   Problem: Coping: Goal: Level of anxiety will decrease Outcome: Progressing   Problem: Elimination: Goal: Will not experience complications related to bowel motility Outcome: Progressing Goal: Will not experience complications related to urinary retention Outcome: Progressing   Problem: Pain Managment: Goal: General experience of comfort will improve Outcome: Progressing   Problem: Safety: Goal: Ability to remain free from injury will improve Outcome: Progressing   Problem: Skin Integrity: Goal: Risk for impaired skin integrity will decrease Outcome: Progressing   Problem: Education: Goal: Knowledge of disease or condition will improve Outcome: Progressing Goal: Knowledge of secondary prevention will improve (SELECT ALL) Outcome: Progressing   

## 2021-03-09 NOTE — Discharge Summary (Signed)
Physician Discharge Summary  LACOLE KOMOROWSKI QQP:[id number] DOB: 07-Jun-1940 DOA: 03/02/2021  PCP: Lajean Manes, MD  Admit date: 03/02/2021 Discharge date: 03/09/2021 Admitted From: Home Disposition:  SNF Recommendations for Outpatient Follow-up:  Follow ups as below. 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  She was down for unknown period of time, and not within tPA window on arrival.  She was also found to be in A. Fib.  TTE with LVEF of 65% and G1 DD.  She was started on Eliquis for anticoagulation.  Heart rate fairly controlled on Cardizem.   Therapy recommended CIR but not approved. She he is discharged to SNF.  See individual problem list below for more on hospital course.  Discharge Diagnoses:  Acute right MCA ZTI:WPYK to be embolic in the setting of right ICA dissection with right M2 occlusion Right ICA dissection-no thrombectomy due to high risk for hemorrhagic transformation -TTE with LVEF of 65% and G1 DD.  LDL 74.  A1c 5.4%. -Continue Eliquis per neurology -Continue PT/OT at SNF.   COPD exacerbation/chronic hypoxic RF-stable on home 3 L except for occasional anxiety driven dyspnea. OSA on CPAP -Received IV steroid briefly.  Completed 5 days of CTX. -Continue breathing treatments -Manage anxiety as below. -Continue nightly CPAP   Positive D dimer: CTA chest negative for PE.  LE [place] negative for DVT. -Already on Eliquis for CVA/A. fib/dissection   New onset A fib with mild RVR: TTE as above.  TSH within normal. -Increase Cardizem CD to 360 mg daily -Cardizem 30 mg every 6 hours as needed for HR>  120. -Continue Eliquis for anticoagulation   Elevated troponin: Likely demand ischemia from A. fib.  TTE reassuring. -Manage A. fib as above   Anterior Rib Fx: Stable.  No flail chest. LLE laceration/skin tear -Continue lidocaine patch and incentive spirometry. -Pain control with as needed Tylenol, tramadol and low-dose oxycodone -See dressing instruction below.   UTI?  UA concerning but urine culture negative. -Completed course of IV ceftriaxone   Essential hypertension: BP slightly elevated.  Likely driven by anxiety -Increase Cardizem CD as above. -Resume home lisinopril -Manage anxiety as below  Leukocytosis: Likely demargination from steroid   Rheumatoid arthritis: Stable. -Continue home Plaquenil and Arava  RLS/anxiety-intermittently anxious -Reassurance -Requip 0.5 mg nightly -BuSpar 5 mg 3 times daily -Klonopin 0.25 mg twice daily as needed   OSA on CPAP:  -Continue nightly CPAP   Physical deconditioning/ambulatory dysfunction -Continue PT/OT   Morbid obesity Body mass index is 41.23 kg/m.           Discharge Exam: Vitals:   03/09/21 0300 03/09/21 0410 03/09/21 0728 03/09/21 0819  BP: (!) 143/62  (!) 175/92   Pulse: 85  98   Temp: 97.8 F (36.6 C)  (!) 97.5 F (36.4 C)   Resp: 20  20   Height:      Weight:  119.4 kg    SpO2: 96%  94% 99%  TempSrc: Axillary  Oral   BMI (Calculated):  41.22       GENERAL: No apparent distress.  Nontoxic. HEENT: MMM.  Vision and hearing grossly intact.  NECK: Supple.  No  apparent JVD.  RESP: 96% on 3 L.  No IWOB.  Fair aeration bilaterally. CVS:  RRR. Heart sounds normal.  ABD/GI/GU: Bowel sounds present. Soft. Non tender.  MSK/EXT:  Moves extremities.  LLE skin tear/laceration.  SKIN: no apparent skin lesion or wound NEURO: Awake and alert.  Oriented appropriately.  No apparent focal neuro deficit. PSYCH: Somewhat anxious but calms down with reassurance.  Discharge Instructions  Discharge Instructions      Diet - low sodium heart healthy   Complete by: As directed    Discharge wound care:   Complete by: As directed    Cleanse the LLE skin tear with NS then place a cut to fit piece of Xeroform gauze over the wound. Wrap with Kerlix. Change daily.   Increase activity slowly   Complete by: As directed       Allergies as of 03/09/2021       Reactions   Shellfish Allergy Anaphylaxis   With vomiting and diarrhea   Atorvastatin Other (See Comments)   Leg muscle pain   Sertraline Hcl    Unknown reaction   Macrolides And Ketolides Rash   Penicillins Other (See Comments)   Facial numbness   Sulfa Antibiotics Other (See Comments)   Facial numbness        Medication List     STOP taking these medications    AMLODIPINE BESYLATE PO   aspirin 81 MG tablet   guaiFENesin-dextromethorphan 100-10 MG/5ML syrup Commonly known as: ROBITUSSIN DM   levalbuterol 45 MCG/ACT inhaler Commonly known as: XOPENEX HFA   RA SALINE ENEMA RE   zinc sulfate 220 (50 Zn) MG capsule       TAKE these medications    acetaminophen 500 MG tablet Commonly known as: TYLENOL Take 2 tablets (1,000 mg total) by mouth every 8 (eight) hours as needed for mild pain, headache or fever. What changed:  when to take this reasons to take this   alendronate 70 MG tablet Commonly known as: FOSAMAX Take 1 tablet (70 mg total) by mouth once a week.   apixaban 5 MG Tabs tablet Commonly known as: ELIQUIS Take 1 tablet (5 mg total) by mouth 2 (two) times daily.   bisacodyl 10 MG suppository Commonly known as: DULCOLAX Place 10 mg rectally as needed for moderate constipation.   busPIRone 5 MG tablet Commonly known as: BUSPAR Take 1 tablet (5 mg total) by mouth 3 (three) times daily.   Calcium Carbonate-Vitamin D 600-200 MG-UNIT Tabs Take 1 tablet by mouth daily.   clonazePAM 0.5 MG tablet Commonly known as: KLONOPIN Take 0.5 tablets (0.25 mg total) by mouth 2 (two) times daily as needed for  anxiety (Anxiety).   diltiazem 30 MG tablet Commonly known as: CARDIZEM Take 1 tablet (30 mg total) by mouth every 6 (six) hours as needed (sustained HR>120. Hold if SBP<100 or DBP <60).   diltiazem 360 MG 24 hr capsule Commonly known as: CARDIZEM CD Take 1 capsule (360 mg total) by mouth daily.   hydroxychloroquine 200 MG tablet Commonly known as: PLAQUENIL Take 1 tablet (200 mg total) by mouth 2 (two) times daily.   ipratropium-albuterol 0.5-2.5 (3) MG/3ML Soln Commonly known as: DUONEB Take 3 mLs by nebulization every 6 (six) hours as needed.   leflunomide 20 MG tablet Commonly known as: ARAVA TAKE ONE TABLET BY MOUTH EVERY MORNING   lisinopril 5 MG tablet Commonly known as: ZESTRIL TAKE ONE TABLET BY MOUTH EVERY MORNING   loratadine 10 MG tablet Commonly known  as: CLARITIN Take 10 mg by mouth daily.   magnesium hydroxide 400 MG/5ML suspension Commonly known as: MILK OF MAGNESIA Take by mouth daily as needed for mild constipation.   multivitamin with minerals Tabs tablet Take 1 tablet by mouth daily.   omeprazole 40 MG capsule Commonly known as: PRILOSEC Take 1 capsule (40 mg total) by mouth daily. What changed:  when to take this reasons to take this   oxyCODONE 5 MG immediate release tablet Commonly known as: Roxicodone Take 1 tablet (5 mg total) by mouth every 8 (eight) hours as needed for up to 5 days for breakthrough pain or severe pain.   OXYGEN Inhale into the lungs. 2 lpm with rest and exertion   predniSONE 5 MG tablet Commonly known as: DELTASONE Take 1.5 tablets (7.5 mg total) by mouth daily with breakfast. For COPD   rOPINIRole 0.25 MG tablet Commonly known as: REQUIP Take 2 tablets (0.5 mg total) by mouth at bedtime. What changed:  how much to take when to take this   rosuvastatin 5 MG tablet Commonly known as: CRESTOR Take 1 tablet (5 mg total) by mouth every morning.   senna-docusate 8.6-50 MG tablet Commonly known as:  Senokot-S Take 1 tablet by mouth 2 (two) times daily between meals as needed for mild constipation.   traMADol 50 MG tablet Commonly known as: ULTRAM Take 1 tablet (50 mg total) by mouth every 8 (eight) hours as needed for up to 7 days for moderate pain.   Trelegy Ellipta 200-62.5-25 MCG/ACT Aepb Generic drug: Fluticasone-Umeclidin-Vilant Inhale 1 puff into the lungs daily.   vitamin C 1000 MG tablet Take 1 tablet (1,000 mg total) by mouth daily.               Durable Medical Equipment  (From admission, onward)           Start     Ordered   03/05/21 1320  For home use only DME Walker rolling  Once       Question Answer Comment  Walker: With [place]   Patient needs a walker to treat with the following condition Weakness      03/05/21 1320              Discharge Care Instructions  (From admission, onward)           Start     Ordered   03/09/21 0000  Discharge wound care:       Comments: Cleanse the LLE skin tear with NS then place a cut to fit piece of Xeroform gauze over the wound. Wrap with Kerlix. Change daily.   03/09/21 0747            Consultations: None  Procedures/Studies:   CT HEAD WO CONTRAST  Result Date: 03/02/2021 CLINICAL DATA:  Head trauma, minor (Age >= 65y); Neck trauma (Age >= 65y) EXAM: CT HEAD WITHOUT CONTRAST CT CERVICAL SPINE WITHOUT CONTRAST TECHNIQUE: Multidetector CT imaging of the head and cervical spine was performed following the standard protocol without intravenous contrast. Multiplanar CT image reconstructions of the cervical spine were also generated. COMPARISON:  06/11/2020 FINDINGS: CT HEAD FINDINGS Brain: Large area of decreased attenuation with loss of gray-white differentiation predominantly involving the right parietal lobe suspicious for acute infarction. No evidence of intracranial hemorrhage. No extra-axial collection. Prior left basal ganglia lacunar infarct. Scattered low-density changes within the  periventricular and subcortical white matter compatible with chronic microvascular ischemic change. Mild diffuse cerebral volume loss. Vascular: Atherosclerotic  calcifications involving the large vessels of the skull base. 7 mm partially peripherally calcified structure in the region of the proximal right MCA, possibly reflecting a small partially calcified aneurysm sac. No unexpected hyperdense vessel. Skull: Normal. Negative for fracture or focal lesion. Sinuses/Orbits: No acute finding. Other: None. CT CERVICAL SPINE FINDINGS Alignment: Facet joints are aligned without dislocation or traumatic listhesis. Dens and lateral masses are aligned. Straightening of the cervical lordosis. Skull base and vertebrae: No acute fracture. No primary bone lesion or focal pathologic process. Soft tissues and spinal canal: No prevertebral fluid or swelling. No visible canal hematoma. Disc levels: Degenerative disc disease most pronounced at C5-6 and C6-7. Multilevel bilateral facet arthropathy. Upper chest: Negative. Other: Medial course of the bilateral common carotid arteries. Carotid atherosclerosis. IMPRESSION: 1. Findings suggestive of acute right MCA territory infarction. Further evaluation with MRI of the brain is recommended. 2. Incidentally noted 7 mm partially peripherally calcified structure in the region of the proximal right MCA, suspicious for a small partially calcified aneurysm sac. Attention on follow-up vascular imaging. 3. No acute fracture or traumatic listhesis of the cervical spine. 4. Degenerative disc disease and facet arthropathy of the cervical spine, most pronounced at C5-6 and C6-7. These results were called by telephone at the time of interpretation on 03/02/2021 at 1:35 pm to provider JULIE HAVILAND , who verbally acknowledged these results. Electronically Signed   By: Davina Poke D.O.   On: 03/02/2021 13:35   CT Chest Wo Contrast  Result Date: 03/02/2021 CLINICAL DATA:  Shortness of breath,  fall history of COPD, asthma EXAM: CT CHEST WITHOUT CONTRAST TECHNIQUE: Multidetector CT imaging of the chest was performed following the standard protocol without IV contrast. COMPARISON:  CT chest 11/05/2019 FINDINGS: Cardiovascular: The heart is not enlarged. There is no pericardial effusion. There are dense mitral annular calcifications, mild aortic valve calcifications, three-vessel coronary artery disease and calcified atherosclerotic plaque throughout the thoracic aorta. Mediastinum/Nodes: The thyroid is unremarkable. The esophagus is grossly unremarkable. There is a small hiatal hernia. There is no mediastinal or axillary lymphadenopathy. There is no bulky hilar adenopathy. Lungs/Pleura: The trachea and central airways are patent. Moderate centrilobular and paraseptal emphysema is again seen with mild central bronchial wall thickening. Again seen is peripheral fibrotic change with areas of honeycombing more predominant in the lung bases. The findings are not significantly progressed since 11/05/2019. Patchy opacities in the lateral left base and lingula likely reflect areas of scarring or atelectasis. There is no focal consolidation or pulmonary edema. There is no pleural effusion or pneumothorax. Upper Abdomen: Contrast is seen in the right collecting system from a prior study. There are no acute findings in the imaged upper abdomen. Musculoskeletal: There acute nondisplaced fractures of the left sixth and seventh ribs anterolaterally. IMPRESSION: 1. Acute nondisplaced fractures of the left anterolateral sixth and seventh ribs. 2. Basal prominent fibrotic change in both lungs, overall not significantly progressed since 2021. Additional patchy opacities in the lateral left base and lingula likely reflect additional areas of scar or atelectasis. 3. Dense mitral annular calcifications, aortic valve calcifications, and three-vessel coronary artery calcification. 4. Aortic Atherosclerosis (ICD10-I70.0) and  Emphysema (ICD10-J43.9). Electronically Signed   By: Valetta Mole M.D.   On: 03/02/2021 16:32   CT Angio Chest Pulmonary Embolism (PE) W or WO Contrast  Result Date: 03/04/2021 CLINICAL DATA:  Chest pain, shortness of breath. Possible effusion. Fall. EXAM: CT ANGIOGRAPHY CHEST WITH CONTRAST TECHNIQUE: Multidetector CT imaging of the chest was performed using the standard protocol  during bolus administration of intravenous contrast. Multiplanar CT image reconstructions and MIPs were obtained to evaluate the vascular anatomy. CONTRAST:  67mL OMNIPAQUE IOHEXOL 350 MG/ML SOLN COMPARISON:  03/02/2021 FINDINGS: Cardiovascular: Mild cardiomegaly. Calcification over the mitral valve annulus. Calcified plaque over the left main and 3 vessel coronary arteries. Thoracic aorta is normal caliber. There is calcified plaque over the thoracic aorta. Pulmonary arterial system is well opacified and demonstrates no evidence of emboli. Remaining vascular structures are unremarkable. Mediastinum/Nodes: No mediastinal or hilar adenopathy. Remaining mediastinal structures are normal. Lungs/Pleura: Lungs are adequately inflated without focal airspace consolidation or effusion. Minimal stable patchy fibrotic change. Airways are normal. Upper Abdomen: Calcified plaque over the abdominal aorta. High density material within the gallbladder likely contrast excretion. Musculoskeletal: Spondylosis of the thoracic spine. Review of the MIP images confirms the above findings. IMPRESSION: 1. No acute cardiopulmonary disease and no evidence of pulmonary embolism. 2. Patchy stable pulmonary fibrotic changes. 3. Mild cardiomegaly. Atherosclerotic coronary artery disease. 4. Aortic atherosclerosis. Aortic Atherosclerosis (ICD10-I70.0). Electronically Signed   By: Marin Olp M.D.   On: 03/04/2021 15:12   CT CERVICAL SPINE WO CONTRAST  Result Date: 03/02/2021 CLINICAL DATA:  Head trauma, minor (Age >= 65y); Neck trauma (Age >= 65y) EXAM: CT  HEAD WITHOUT CONTRAST CT CERVICAL SPINE WITHOUT CONTRAST TECHNIQUE: Multidetector CT imaging of the head and cervical spine was performed following the standard protocol without intravenous contrast. Multiplanar CT image reconstructions of the cervical spine were also generated. COMPARISON:  06/11/2020 FINDINGS: CT HEAD FINDINGS Brain: Large area of decreased attenuation with loss of gray-white differentiation predominantly involving the right parietal lobe suspicious for acute infarction. No evidence of intracranial hemorrhage. No extra-axial collection. Prior left basal ganglia lacunar infarct. Scattered low-density changes within the periventricular and subcortical white matter compatible with chronic microvascular ischemic change. Mild diffuse cerebral volume loss. Vascular: Atherosclerotic calcifications involving the large vessels of the skull base. 7 mm partially peripherally calcified structure in the region of the proximal right MCA, possibly reflecting a small partially calcified aneurysm sac. No unexpected hyperdense vessel. Skull: Normal. Negative for fracture or focal lesion. Sinuses/Orbits: No acute finding. Other: None. CT CERVICAL SPINE FINDINGS Alignment: Facet joints are aligned without dislocation or traumatic listhesis. Dens and lateral masses are aligned. Straightening of the cervical lordosis. Skull base and vertebrae: No acute fracture. No primary bone lesion or focal pathologic process. Soft tissues and spinal canal: No prevertebral fluid or swelling. No visible canal hematoma. Disc levels: Degenerative disc disease most pronounced at C5-6 and C6-7. Multilevel bilateral facet arthropathy. Upper chest: Negative. Other: Medial course of the bilateral common carotid arteries. Carotid atherosclerosis. IMPRESSION: 1. Findings suggestive of acute right MCA territory infarction. Further evaluation with MRI of the brain is recommended. 2. Incidentally noted 7 mm partially peripherally calcified  structure in the region of the proximal right MCA, suspicious for a small partially calcified aneurysm sac. Attention on follow-up vascular imaging. 3. No acute fracture or traumatic listhesis of the cervical spine. 4. Degenerative disc disease and facet arthropathy of the cervical spine, most pronounced at C5-6 and C6-7. These results were called by telephone at the time of interpretation on 03/02/2021 at 1:35 pm to provider JULIE HAVILAND , who verbally acknowledged these results. Electronically Signed   By: Davina Poke D.O.   On: 03/02/2021 13:35   MR BRAIN WO CONTRAST  Result Date: 03/03/2021 CLINICAL DATA:  Acute neurologic deficit EXAM: MRI HEAD WITHOUT CONTRAST TECHNIQUE: Multiplanar, multiecho pulse sequences of the brain and surrounding  structures were obtained without intravenous contrast. COMPARISON:  None. FINDINGS: Brain: Large acute infarct the posterior right MCA territory. Small focus of abnormal diffusion restriction in the left centrum semi ovale. No acute or chronic hemorrhage. There is multifocal hyperintense T2-weighted signal within the white matter. Generalized volume loss without a clear lobar predilection. Old left basal ganglia small vessel infarct and multiple cerebellar infarcts. The midline structures are normal. Vascular: Superiorly directed supraclinoid right ICA aneurysm, as better demonstrated on earlier CTA head neck. Skull and upper cervical spine: Normal calvarium and skull base. Visualized upper cervical spine and soft tissues are normal. Sinuses/Orbits:No paranasal sinus fluid levels or advanced mucosal thickening. No mastoid or middle ear effusion. Normal orbits. IMPRESSION: 1. Large acute posterior right MCA territory infarct. No hemorrhage or mass effect. 2. Small focus of acute ischemia in the left centrum semiovale. 3. Superiorly directed supraclinoid right ICA aneurysm, as better demonstrated on earlier CTA head neck. Electronically Signed   By: Ulyses Jarred  M.D.   On: 03/03/2021 03:38   DG Chest Port 1 View  Result Date: 03/02/2021 CLINICAL DATA:  Short of breath and chest pain EXAM: PORTABLE CHEST 1 VIEW COMPARISON:  06/11/2020 FINDINGS: Cardiac enlargement without heart failure. Atherosclerotic calcification aortic arch. Mild bibasilar atelectasis similar to prior study. No significant pleural effusion. IMPRESSION: Mild bibasilar atelectasis. Negative for heart failure. Underlying COPD. Electronically Signed   By: Franchot Gallo M.D.   On: 03/02/2021 12:46   ECHOCARDIOGRAM COMPLETE  Result Date: 03/03/2021    ECHOCARDIOGRAM REPORT   Patient Name:   Manon C Klumb Date of Exam: 03/03/2021 Medical Rec #:  [id number]   Height:       67.0 in Accession #:    [id number]  Weight:       270.1 lb Date of Birth:  Nov 23, 1940   BSA:          2.298 m Patient Age:    89 years    BP:           136/84 mmHg Patient Gender: F           HR:           101 bpm. Exam Location:  Inpatient Procedure: 2D Echo Indications:    stroke  History:        Patient has no prior history of Echocardiogram examinations.                 Right carotid dissection, COPD; Risk Factors:Hypertension and                 Sleep Apnea.  Sonographer:    Johny Chess RDCS Referring Phys: [place]  1. Left ventricular ejection fraction, by estimation, is 60 to 65%. The left ventricle has normal function. The left ventricle has no regional wall motion abnormalities. There is mild left ventricular hypertrophy. Left ventricular diastolic parameters are consistent with Grade I diastolic dysfunction (impaired relaxation).  2. Right ventricular systolic function is normal. The right ventricular size is normal.  3. Left atrial size was mildly dilated.  4. The mitral valve is normal in structure. No evidence of mitral valve regurgitation. No evidence of mitral stenosis. Moderate mitral annular calcification.  5. The aortic valve is tricuspid. Aortic valve regurgitation is mild. Aortic valve  sclerosis is present, with no evidence of aortic valve stenosis.  6. Aortic dilatation noted. There is borderline dilatation of the aortic root, measuring 38 mm.  7. The inferior vena cava is  normal in size with greater than 50% respiratory variability, suggesting right atrial pressure of 3 mmHg. FINDINGS  Left Ventricle: Left ventricular ejection fraction, by estimation, is 60 to 65%. The left ventricle has normal function. The left ventricle has no regional wall motion abnormalities. The left ventricular internal cavity size was normal in size. There is  mild left ventricular hypertrophy. Left ventricular diastolic parameters are consistent with Grade I diastolic dysfunction (impaired relaxation). Right Ventricle: The right ventricular size is normal. Right ventricular systolic function is normal. Left Atrium: Left atrial size was mildly dilated. Right Atrium: Right atrial size was normal in size. Pericardium: There is no evidence of pericardial effusion. Presence of epicardial fat layer. Mitral Valve: The mitral valve is normal in structure. Moderate mitral annular calcification. No evidence of mitral valve regurgitation. No evidence of mitral valve stenosis. Tricuspid Valve: The tricuspid valve is normal in structure. Tricuspid valve regurgitation is trivial. No evidence of tricuspid stenosis. Aortic Valve: The aortic valve is tricuspid. Aortic valve regurgitation is mild. Aortic valve sclerosis is present, with no evidence of aortic valve stenosis. Pulmonic Valve: The pulmonic valve was normal in structure. Pulmonic valve regurgitation is not visualized. No evidence of pulmonic stenosis. Aorta: Aortic dilatation noted. There is borderline dilatation of the aortic root, measuring 38 mm. Venous: The inferior vena cava is normal in size with greater than 50% respiratory variability, suggesting right atrial pressure of 3 mmHg. IAS/Shunts: No atrial level shunt detected by color flow Doppler.  LEFT VENTRICLE PLAX 2D  LVIDd:         4.80 cm   Diastology LVIDs:         3.80 cm   LV e' medial:  6.09 cm/s LV PW:         1.30 cm   LV e' lateral: 5.44 cm/s LV IVS:        1.20 cm LVOT diam:     1.90 cm LV SV:         83 LV SV Index:   36 LVOT Area:     2.84 cm  RIGHT VENTRICLE             IVC RV S prime:     15.40 cm/s  IVC diam: 1.60 cm LEFT ATRIUM             Index LA diam:        4.40 cm 1.92 cm/m LA Vol (A2C):   63.2 ml 27.51 ml/m LA Vol (A4C):   85.5 ml 37.21 ml/m LA Biplane Vol: 77.4 ml 33.69 ml/m  AORTIC VALVE LVOT Vmax:   149.00 cm/s LVOT Vmean:  100.000 cm/s LVOT VTI:    0.292 m  AORTA Ao Root diam: 3.80 cm Ao Asc diam:  3.70 cm  SHUNTS Systemic VTI:  0.29 m Systemic Diam: 1.90 cm Kirk Ruths MD Electronically signed by Kirk Ruths MD Signature Date/Time: 03/03/2021/4:34:20 PM    Final    VAS [place] LOWER EXTREMITY VENOUS (DVT)  Result Date: 03/04/2021  Lower Venous DVT Study Patient Name:  [facility]  Date of Exam:   03/04/2021 Medical Rec #: [id number]    Accession #:    [id number] Date of Birth: 1940/10/30    Patient Gender: F Patient Age:   50 years Exam Location:  [facility] Procedure:      VAS [place] LOWER EXTREMITY VENOUS (DVT) Referring Phys: Jerald Kief REGALADO --------------------------------------------------------------------------------  Indications: Elevated Ddimer.  Risk Factors: None identified. Limitations: Poor ultrasound/tissue interface. Comparison Study: No  prior studies. Performing Technologist: Oliver Hum RVT  Examination Guidelines: A complete evaluation includes B-mode imaging, spectral Doppler, color Doppler, and power Doppler as needed of all accessible portions of each vessel. Bilateral testing is considered an integral part of a complete examination. Limited examinations for reoccurring indications may be performed as noted. The reflux portion of the exam is performed with the patient in reverse Trendelenburg.   +---------+---------------+---------+-----------+----------+--------------+ RIGHT    CompressibilityPhasicitySpontaneityPropertiesThrombus Aging +---------+---------------+---------+-----------+----------+--------------+ CFV      Full           Yes      Yes                                 +---------+---------------+---------+-----------+----------+--------------+ SFJ      Full                                                        +---------+---------------+---------+-----------+----------+--------------+ FV Prox  Full                                                        +---------+---------------+---------+-----------+----------+--------------+ FV Mid   Full                                                        +---------+---------------+---------+-----------+----------+--------------+ FV DistalFull                                                        +---------+---------------+---------+-----------+----------+--------------+ PFV      Full                                                        +---------+---------------+---------+-----------+----------+--------------+ POP      Full           Yes      Yes                                 +---------+---------------+---------+-----------+----------+--------------+ PTV      Full                                                        +---------+---------------+---------+-----------+----------+--------------+ PERO     Full                                                        +---------+---------------+---------+-----------+----------+--------------+   +---------+---------------+---------+-----------+----------+--------------+  LEFT     CompressibilityPhasicitySpontaneityPropertiesThrombus Aging +---------+---------------+---------+-----------+----------+--------------+ CFV      Full           Yes      Yes                                  +---------+---------------+---------+-----------+----------+--------------+ SFJ      Full                                                        +---------+---------------+---------+-----------+----------+--------------+ FV Prox  Full                                                        +---------+---------------+---------+-----------+----------+--------------+ FV Mid   Full                                                        +---------+---------------+---------+-----------+----------+--------------+ FV DistalFull                                                        +---------+---------------+---------+-----------+----------+--------------+ PFV      Full                                                        +---------+---------------+---------+-----------+----------+--------------+ POP      Full           Yes      Yes                                 +---------+---------------+---------+-----------+----------+--------------+ PTV      Full                                                        +---------+---------------+---------+-----------+----------+--------------+ PERO     Full                                                        +---------+---------------+---------+-----------+----------+--------------+     Summary: RIGHT: - There is no evidence of deep vein thrombosis in the lower extremity.  - No cystic structure found in the popliteal fossa.  LEFT: - There is no evidence of deep vein thrombosis in the lower extremity.  - No   cystic structure found in the popliteal fossa.  *See table(s) above for measurements and observations. Electronically signed by Jamelle Haring on 03/04/2021 at 11:59:34 AM.    Final    CT ANGIO HEAD NECK W WO CM W PERF (CODE STROKE)  Result Date: 03/02/2021 CLINICAL DATA:  Stroke, follow up EXAM: CT ANGIOGRAPHY HEAD AND NECK CT PERFUSION BRAIN TECHNIQUE: Multidetector CT imaging of the head and neck was performed using  the standard protocol during bolus administration of intravenous contrast. Multiplanar CT image reconstructions and MIPs were obtained to evaluate the vascular anatomy. Carotid stenosis measurements (when applicable) are obtained utilizing NASCET criteria, using the distal internal carotid diameter as the denominator. Multiphase CT imaging of the brain was performed following IV bolus contrast injection. Subsequent parametric perfusion maps were calculated using RAPID software. CONTRAST:  155mL OMNIPAQUE IOHEXOL 350 MG/ML SOLN COMPARISON:  None. FINDINGS: CTA NECK FINDINGS Aorta: Calcific atherosclerosis.  Great vessel origins are patent. Right carotid system: Common carotid artery is patent. Calcific atherosclerosis at the carotid bifurcation with approximately 40% stenosis. 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 Mild dilation in this region, compatible with small pseudoaneurysm. Both the false and true lumens are patent. Retropharyngeal course. Left carotid system: Carotid bifurcation atherosclerosis without greater than 50% stenosis. Mild irregularity of the ICA at the skull base, probably atherosclerotic given no intimal flap to suggest dissection. The ICAs tortuous at the skull base. Retropharyngeal course. Vertebral arteries: Mildly right dominant. Patent. Mild atherosclerotic irregularity bilaterally. Skeleton: Multilevel degenerative change of the cervical spine. Other neck: No acute abnormality. Upper chest: Emphysema. No consolidation visualized lung apices. Biapical pleuroparenchymal scarring. Review of the MIP images confirms the above findings CTA HEAD FINDINGS Anterior circulation: Bilateral intracranial ICAs are patent with mild atherosclerotic narrowing. Bilateral M1 MCAs are patent. Occlusion of the proximal right M2 MCA. 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 Motion limited evaluation of the ACAs and distal MCAs. The ACAs appear to be grossly patent. Posterior circulation: Dominant right intradural vertebral artery. Bilateral intradural vertebral artery, basilar artery, and posterior cerebral arteries are patent without proximal high-grade stenosis. Prominent right posterior communicating artery with small right P1 PCA, anatomic variant. Motion limited evaluation of the PCAs. Venous sinuses: As permitted by contrast timing, patent. Anatomic variants: Detailed above. CT Brain Perfusion Findings: CBF (<30%) Volume: 18mL Perfusion (Tmax>6.0s) volume: 15mL. Possibly overestimated given probably artifactual T-max greater than 6 seconds in the left anterior and middle cranial fossa. Mismatch Volume: 56mL Infarction Location:Posterior right MCA territory. IMPRESSION: 1. Acute right M2 MCA occlusion. 2. 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 3. On CT perfusion, largely completed right distal MCA territory infarct with 55 mL core infarct and 11 mL penumbra. Penumbra may be overestimated given probably artifactual T-max greater than 6 seconds in the left anterior and anterior middle cranial fossa. 4. 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 5. Bilateral carotid bifurcation atherosclerosis with approximately 40% stenosis of the proximal right ICA. 6. Aortic Atherosclerosis (ICD10-I70.0) and Emphysema (ICD10-J43.9). LVO, perfusion findings, and aneurysm discussed with Dr. Rory Percy 2:25 PM via telephone. Dissection discussed at 2:35 p.m. via telephone. Electronically Signed   By: Margaretha Sheffield M.D.   On: 03/02/2021 14:57       The results of  significant diagnostics from this hospitalization (including imaging, microbiology, ancillary and laboratory) are listed below for reference.      Microbiology: Recent Results (from the past 240 hour(s))  Resp Panel by RT-PCR (Flu A&B, Covid) Nasopharyngeal Swab     Status: None   Collection Time: 03/02/21  3:30 PM   Specimen: Nasopharyngeal Swab; Nasopharyngeal(NP) swabs in vial transport medium  Result Value Ref Range Status   SARS Coronavirus 2 by RT PCR NEGATIVE NEGATIVE Final    Comment: (NOTE) SARS-CoV-2 target nucleic acids are NOT DETECTED.  The SARS-CoV-2 RNA is generally detectable in upper respiratory specimens during the acute phase of infection. The lowest concentration of SARS-CoV-2 viral copies this assay can detect is 138 copies/mL. A negative result does not preclude SARS-Cov-2 infection and should not be used as the sole basis for treatment or other patient management decisions. A negative result may occur with  improper specimen collection/handling, submission of specimen other than nasopharyngeal swab, presence of viral mutation(s) within the areas targeted by this assay, and inadequate number of viral copies(<138 copies/mL). A negative result must be combined with clinical observations, patient history, and epidemiological information. The expected result is Negative.  Fact Sheet for Patients:  EntrepreneurPulse.com.au  Fact Sheet for Healthcare Providers:  IncredibleEmployment.be  This test is no t yet approved or cleared by the [place] FDA and  has been authorized for detection and/or diagnosis of SARS-CoV-2 by FDA under an Emergency Use Authorization (EUA). This EUA will remain  in effect (meaning this test can be used) for the duration of the COVID-19 declaration under Section 564(b)(1) of the Act, 21 U.S.C.section 360bbb-3(b)(1), unless the authorization is terminated  or revoked sooner.       Influenza A by PCR NEGATIVE NEGATIVE Final   Influenza B by PCR NEGATIVE NEGATIVE Final    Comment: (NOTE) The Xpert Xpress SARS-CoV-2/FLU/RSV plus assay is  intended as an aid in the diagnosis of influenza from Nasopharyngeal swab specimens and should not be used as a sole basis for treatment. Nasal washings and aspirates are unacceptable for Xpert Xpress SARS-CoV-2/FLU/RSV testing.  Fact Sheet for Patients: EntrepreneurPulse.com.au  Fact Sheet for Healthcare Providers: IncredibleEmployment.be  This test is not yet approved or cleared by the [place] FDA and has been authorized for detection and/or diagnosis of SARS-CoV-2 by FDA under an Emergency Use Authorization (EUA). This EUA will remain in effect (meaning this test can be used) for the duration of the COVID-19 declaration under Section 564(b)(1) of the Act, 21 U.S.C. section 360bbb-3(b)(1), unless the authorization is terminated or revoked.  Performed at [facility], [address], [place], [place] [address]   Urine Culture     Status: None   Collection Time: 03/04/21  1:52 PM   Specimen: Urine, Clean Catch  Result Value Ref Range Status   Specimen Description URINE, CLEAN CATCH  Final   Special Requests NONE  Final   Culture   Final    NO GROWTH Performed at [facility], [address] [place], [place] [address]    Report Status 03/05/2021 FINAL  Final     Labs:  CBC: Recent Labs  Lab 03/02/21 1158 03/04/21 0708 03/05/21 0125 03/06/21 0047 03/08/21 0147 03/09/21 0156  WBC 15.8* 18.1* 18.6* 11.9* 16.0* 18.3*  NEUTROABS 11.3*  --   --   --   --   --   HGB 12.4 11.2* 11.3* 10.8* 11.6* 11.3*  HCT 38.4 34.2* 35.4* 32.6* 35.8* 33.8*  MCV 101.6* 98.8  99.2 97.3 98.1 97.1  PLT 135* 126* 120* 111* 157 153   BMP &GFR Recent Labs  Lab 03/04/21 0647 03/05/21 0125 03/06/21 0047 03/07/21 0103 03/08/21 0147 03/09/21 0156  NA 137 138 138 137 136 138  K 4.2 4.0 3.5 3.9 3.8 3.6  CL 104 104 105 103 104 104  CO2 24 27 25 24 22 23   GLUCOSE 121* 103* 125* 106* 84 90  BUN 15 13 14 18 15 12   CREATININE 1.01* 0.81 0.83 0.87  0.80 0.74  CALCIUM 8.4* 8.1* 7.9* 8.2* 8.0* 8.2*  MG 1.8  --   --   --  1.9 1.9  PHOS  --   --   --   --  2.9 3.5   Estimated Creatinine Clearance: 73.7 mL/min (by C-G formula based on SCr of 0.74 mg/dL). Liver & Pancreas: Recent Labs  Lab 03/02/21 1158 03/08/21 0147 03/09/21 0156  AST 30  --   --   ALT 17  --   --   ALKPHOS 63  --   --   BILITOT 0.9  --   --   PROT 6.7  --   --   ALBUMIN 3.6 2.7* 2.7*   No results for input(s): LIPASE, AMYLASE in the last 168 hours. No results for input(s): AMMONIA in the last 168 hours. Diabetic: No results for input(s): HGBA1C in the last 72 hours. Recent Labs  Lab 03/08/21 0730 03/08/21 1202 03/08/21 1605 03/08/21 2123 03/09/21 0732  GLUCAP 80 113* 133* 81 90   Cardiac Enzymes: No results for input(s): CKTOTAL, CKMB, CKMBINDEX, TROPONINI in the last 168 hours. No results for input(s): PROBNP in the last 8760 hours. Coagulation Profile: Recent Labs  Lab 03/02/21 1158  INR 0.9   Thyroid Function Tests: No results for input(s): TSH, T4TOTAL, FREET4, T3FREE, THYROIDAB in the last 72 hours. Lipid Profile: No results for input(s): CHOL, HDL, LDLCALC, TRIG, CHOLHDL, LDLDIRECT in the last 72 hours. Anemia Panel: No results for input(s): VITAMINB12, FOLATE, FERRITIN, TIBC, IRON, RETICCTPCT in the last 72 hours. Urine analysis:    Component Value Date/Time   COLORURINE YELLOW 03/02/2021 1446   APPEARANCEUR CLOUDY (A) 03/02/2021 1446   LABSPEC 1.015 03/02/2021 1446   PHURINE 5.0 03/02/2021 1446   GLUCOSEU NEGATIVE 03/02/2021 1446   HGBUR SMALL (A) 03/02/2021 1446   BILIRUBINUR NEGATIVE 03/02/2021 1446   KETONESUR NEGATIVE 03/02/2021 1446   PROTEINUR 30 (A) 03/02/2021 1446   NITRITE POSITIVE (A) 03/02/2021 1446   LEUKOCYTESUR LARGE (A) 03/02/2021 1446   Sepsis Labs: Invalid input(s): PROCALCITONIN, LACTICIDVEN   Time coordinating discharge: 55 minutes  SIGNED:  Mercy Riding, MD  [facility] 03/09/2021, 10:19 AM

## 2021-03-09 NOTE — Progress Notes (Signed)
IV removed with no complaints or concerns stated. Assisted patient with gathering of personal belongings, Patient and family educated on rehab facility that patient will be transferred to. Report called to [facility]. No complaints or concerns stated at this time. Patient medicated at bedside before discharge.

## 2021-03-09 NOTE — TOC Transition Note (Signed)
Transition of Care [facility]) - CM/SW Discharge Note   Patient Details  Name: [name] MRN: [id number] Date of Birth: July 30, 1940  [facility]) CM/SW Contact:  Geralynn Ochs, LCSW Phone Number: 03/09/2021, 12:03 PM   Clinical Narrative:   Nurse to call report to [phone], Room 105.    Final next level of care: Skilled Nursing Facility Barriers to Discharge: Barriers Resolved   Patient Goals and CMS Choice Patient states their goals for this hospitalization and ongoing recovery are:: return home CMS Medicare.gov Compare Post Acute Care list provided to:: Patient Represenative (must comment) Choice offered to / list presented to : Adult Children  Discharge Placement              Patient chooses bed at: [facility] Patient to be transferred to facility by: [facility] Name of family member notified: Shanon Brow Patient and family notified of of transfer: 03/09/21  Discharge Plan and Services In-house Referral: Clinical Social Work Discharge Planning Services: CM Consult Post Acute Care Choice: Durable Medical Equipment          DME Arranged: Gilford Rile rolling DME Agency: [employer] Date DME Agency Contacted: 03/05/21 Time DME Agency Contacted: [phone] Representative spoke with at DME Agency: Bronwen Betters            Social Determinants of Health ([place]) Interventions     Readmission Risk Interventions No flowsheet data found.

## 2021-03-12 ENCOUNTER — Other Ambulatory Visit: Payer: Self-pay | Admitting: Adult Health

## 2021-03-12 DIAGNOSIS — M0579 Rheumatoid arthritis with rheumatoid factor of multiple sites without organ or systems involvement: Secondary | ICD-10-CM

## 2021-03-13 DIAGNOSIS — S2242XD Multiple fractures of ribs, left side, subsequent encounter for fracture with routine healing: Secondary | ICD-10-CM | POA: Diagnosis not present

## 2021-03-13 DIAGNOSIS — I4891 Unspecified atrial fibrillation: Secondary | ICD-10-CM | POA: Diagnosis not present

## 2021-03-13 DIAGNOSIS — I639 Cerebral infarction, unspecified: Secondary | ICD-10-CM | POA: Diagnosis not present

## 2021-03-13 DIAGNOSIS — I10 Essential (primary) hypertension: Secondary | ICD-10-CM | POA: Diagnosis not present

## 2021-03-13 DIAGNOSIS — R778 Other specified abnormalities of plasma proteins: Secondary | ICD-10-CM | POA: Diagnosis not present

## 2021-03-13 DIAGNOSIS — G4733 Obstructive sleep apnea (adult) (pediatric): Secondary | ICD-10-CM | POA: Diagnosis not present

## 2021-03-13 DIAGNOSIS — J9611 Chronic respiratory failure with hypoxia: Secondary | ICD-10-CM | POA: Diagnosis not present

## 2021-03-13 DIAGNOSIS — J449 Chronic obstructive pulmonary disease, unspecified: Secondary | ICD-10-CM | POA: Diagnosis not present

## 2021-03-13 MED FILL — Fentanyl Citrate Preservative Free (PF) Inj 100 MCG/2ML: INTRAMUSCULAR | Qty: 1 | Status: AC

## 2021-03-14 DIAGNOSIS — I4891 Unspecified atrial fibrillation: Secondary | ICD-10-CM | POA: Diagnosis not present

## 2021-03-14 DIAGNOSIS — F419 Anxiety disorder, unspecified: Secondary | ICD-10-CM | POA: Diagnosis not present

## 2021-03-14 DIAGNOSIS — J449 Chronic obstructive pulmonary disease, unspecified: Secondary | ICD-10-CM | POA: Diagnosis not present

## 2021-03-14 DIAGNOSIS — I10 Essential (primary) hypertension: Secondary | ICD-10-CM | POA: Diagnosis not present

## 2021-03-15 DIAGNOSIS — E559 Vitamin D deficiency, unspecified: Secondary | ICD-10-CM | POA: Diagnosis not present

## 2021-03-15 DIAGNOSIS — I10 Essential (primary) hypertension: Secondary | ICD-10-CM | POA: Diagnosis not present

## 2021-03-15 DIAGNOSIS — E119 Type 2 diabetes mellitus without complications: Secondary | ICD-10-CM | POA: Diagnosis not present

## 2021-03-18 DIAGNOSIS — G4730 Sleep apnea, unspecified: Secondary | ICD-10-CM | POA: Diagnosis not present

## 2021-03-18 DIAGNOSIS — F419 Anxiety disorder, unspecified: Secondary | ICD-10-CM | POA: Diagnosis not present

## 2021-03-18 DIAGNOSIS — N289 Disorder of kidney and ureter, unspecified: Secondary | ICD-10-CM | POA: Diagnosis not present

## 2021-03-18 DIAGNOSIS — R770 Abnormality of albumin: Secondary | ICD-10-CM | POA: Diagnosis not present

## 2021-03-18 DIAGNOSIS — Z20822 Contact with and (suspected) exposure to covid-19: Secondary | ICD-10-CM | POA: Diagnosis not present

## 2021-03-18 DIAGNOSIS — Z9981 Dependence on supplemental oxygen: Secondary | ICD-10-CM | POA: Diagnosis not present

## 2021-03-18 DIAGNOSIS — R0600 Dyspnea, unspecified: Secondary | ICD-10-CM | POA: Diagnosis not present

## 2021-03-18 DIAGNOSIS — D72829 Elevated white blood cell count, unspecified: Secondary | ICD-10-CM | POA: Diagnosis not present

## 2021-03-18 DIAGNOSIS — R6520 Severe sepsis without septic shock: Secondary | ICD-10-CM | POA: Diagnosis not present

## 2021-03-18 DIAGNOSIS — R0602 Shortness of breath: Secondary | ICD-10-CM | POA: Diagnosis not present

## 2021-03-18 DIAGNOSIS — Z792 Long term (current) use of antibiotics: Secondary | ICD-10-CM | POA: Diagnosis not present

## 2021-03-18 DIAGNOSIS — Z66 Do not resuscitate: Secondary | ICD-10-CM | POA: Diagnosis not present

## 2021-03-18 DIAGNOSIS — Z6841 Body Mass Index (BMI) 40.0 and over, adult: Secondary | ICD-10-CM | POA: Diagnosis not present

## 2021-03-18 DIAGNOSIS — I4891 Unspecified atrial fibrillation: Secondary | ICD-10-CM | POA: Diagnosis not present

## 2021-03-18 DIAGNOSIS — G4733 Obstructive sleep apnea (adult) (pediatric): Secondary | ICD-10-CM | POA: Diagnosis not present

## 2021-03-18 DIAGNOSIS — A419 Sepsis, unspecified organism: Secondary | ICD-10-CM | POA: Diagnosis not present

## 2021-03-18 DIAGNOSIS — Z7901 Long term (current) use of anticoagulants: Secondary | ICD-10-CM | POA: Diagnosis not present

## 2021-03-18 DIAGNOSIS — G2581 Restless legs syndrome: Secondary | ICD-10-CM | POA: Diagnosis not present

## 2021-03-18 DIAGNOSIS — J9690 Respiratory failure, unspecified, unspecified whether with hypoxia or hypercapnia: Secondary | ICD-10-CM | POA: Diagnosis not present

## 2021-03-18 DIAGNOSIS — I10 Essential (primary) hypertension: Secondary | ICD-10-CM | POA: Diagnosis not present

## 2021-03-18 DIAGNOSIS — R0902 Hypoxemia: Secondary | ICD-10-CM | POA: Diagnosis not present

## 2021-03-18 DIAGNOSIS — J849 Interstitial pulmonary disease, unspecified: Secondary | ICD-10-CM | POA: Diagnosis not present

## 2021-03-18 DIAGNOSIS — J80 Acute respiratory distress syndrome: Secondary | ICD-10-CM | POA: Diagnosis not present

## 2021-03-18 DIAGNOSIS — J90 Pleural effusion, not elsewhere classified: Secondary | ICD-10-CM | POA: Diagnosis not present

## 2021-03-18 DIAGNOSIS — R000 Tachycardia, unspecified: Secondary | ICD-10-CM | POA: Diagnosis not present

## 2021-03-18 DIAGNOSIS — J189 Pneumonia, unspecified organism: Secondary | ICD-10-CM | POA: Diagnosis not present

## 2021-03-18 DIAGNOSIS — Z7952 Long term (current) use of systemic steroids: Secondary | ICD-10-CM | POA: Diagnosis not present

## 2021-03-18 DIAGNOSIS — M069 Rheumatoid arthritis, unspecified: Secondary | ICD-10-CM | POA: Diagnosis not present

## 2021-03-18 DIAGNOSIS — K219 Gastro-esophageal reflux disease without esophagitis: Secondary | ICD-10-CM | POA: Diagnosis not present

## 2021-03-18 DIAGNOSIS — I4821 Permanent atrial fibrillation: Secondary | ICD-10-CM | POA: Diagnosis not present

## 2021-03-18 DIAGNOSIS — Z7951 Long term (current) use of inhaled steroids: Secondary | ICD-10-CM | POA: Diagnosis not present

## 2021-03-18 DIAGNOSIS — E8809 Other disorders of plasma-protein metabolism, not elsewhere classified: Secondary | ICD-10-CM | POA: Diagnosis not present

## 2021-03-18 DIAGNOSIS — J449 Chronic obstructive pulmonary disease, unspecified: Secondary | ICD-10-CM | POA: Diagnosis not present

## 2021-03-18 DIAGNOSIS — D649 Anemia, unspecified: Secondary | ICD-10-CM | POA: Diagnosis not present

## 2021-03-18 DIAGNOSIS — Z515 Encounter for palliative care: Secondary | ICD-10-CM | POA: Diagnosis not present

## 2021-03-18 DIAGNOSIS — R0689 Other abnormalities of breathing: Secondary | ICD-10-CM | POA: Diagnosis not present

## 2021-03-18 DIAGNOSIS — J440 Chronic obstructive pulmonary disease with acute lower respiratory infection: Secondary | ICD-10-CM | POA: Diagnosis not present

## 2021-03-18 DIAGNOSIS — J9601 Acute respiratory failure with hypoxia: Secondary | ICD-10-CM | POA: Diagnosis not present

## 2021-03-19 DIAGNOSIS — R770 Abnormality of albumin: Secondary | ICD-10-CM | POA: Diagnosis not present

## 2021-03-19 DIAGNOSIS — D649 Anemia, unspecified: Secondary | ICD-10-CM | POA: Diagnosis not present

## 2021-03-19 DIAGNOSIS — N289 Disorder of kidney and ureter, unspecified: Secondary | ICD-10-CM | POA: Diagnosis not present

## 2021-03-19 DIAGNOSIS — J449 Chronic obstructive pulmonary disease, unspecified: Secondary | ICD-10-CM | POA: Diagnosis not present

## 2021-03-19 DIAGNOSIS — J849 Interstitial pulmonary disease, unspecified: Secondary | ICD-10-CM | POA: Diagnosis not present

## 2021-03-19 DIAGNOSIS — G4730 Sleep apnea, unspecified: Secondary | ICD-10-CM | POA: Diagnosis not present

## 2021-03-19 DIAGNOSIS — I4821 Permanent atrial fibrillation: Secondary | ICD-10-CM | POA: Diagnosis not present

## 2021-03-19 DIAGNOSIS — I10 Essential (primary) hypertension: Secondary | ICD-10-CM | POA: Diagnosis not present

## 2021-03-19 DIAGNOSIS — R0600 Dyspnea, unspecified: Secondary | ICD-10-CM | POA: Diagnosis not present

## 2021-03-19 DIAGNOSIS — J9601 Acute respiratory failure with hypoxia: Secondary | ICD-10-CM | POA: Diagnosis not present

## 2021-03-19 DIAGNOSIS — D72829 Elevated white blood cell count, unspecified: Secondary | ICD-10-CM | POA: Diagnosis not present

## 2021-03-30 ENCOUNTER — Ambulatory Visit: Payer: PPO

## 2021-04-15 DEATH — deceased
